# Patient Record
Sex: Female | Born: 1945 | Race: White | Hispanic: No | Marital: Married | Smoking: Never smoker
Health system: Southern US, Community
[De-identification: ages and names within clinical notes are randomized; demographics above are authoritative.]

## PROBLEM LIST (undated history)

## (undated) DIAGNOSIS — J45909 Unspecified asthma, uncomplicated: Secondary | ICD-10-CM

## (undated) DIAGNOSIS — Z9289 Personal history of other medical treatment: Secondary | ICD-10-CM

## (undated) DIAGNOSIS — R3915 Urgency of urination: Secondary | ICD-10-CM

## (undated) DIAGNOSIS — G8929 Other chronic pain: Secondary | ICD-10-CM

## (undated) DIAGNOSIS — R233 Spontaneous ecchymoses: Secondary | ICD-10-CM

## (undated) DIAGNOSIS — R112 Nausea with vomiting, unspecified: Secondary | ICD-10-CM

## (undated) DIAGNOSIS — M8580 Other specified disorders of bone density and structure, unspecified site: Secondary | ICD-10-CM

## (undated) DIAGNOSIS — M1990 Unspecified osteoarthritis, unspecified site: Secondary | ICD-10-CM

## (undated) DIAGNOSIS — R238 Other skin changes: Secondary | ICD-10-CM

## (undated) DIAGNOSIS — D649 Anemia, unspecified: Secondary | ICD-10-CM

## (undated) DIAGNOSIS — T8859XA Other complications of anesthesia, initial encounter: Secondary | ICD-10-CM

## (undated) DIAGNOSIS — I499 Cardiac arrhythmia, unspecified: Secondary | ICD-10-CM

## (undated) DIAGNOSIS — Z8709 Personal history of other diseases of the respiratory system: Secondary | ICD-10-CM

## (undated) DIAGNOSIS — J189 Pneumonia, unspecified organism: Secondary | ICD-10-CM

## (undated) DIAGNOSIS — J40 Bronchitis, not specified as acute or chronic: Secondary | ICD-10-CM

## (undated) DIAGNOSIS — M2550 Pain in unspecified joint: Secondary | ICD-10-CM

## (undated) DIAGNOSIS — M545 Low back pain, unspecified: Secondary | ICD-10-CM

## (undated) DIAGNOSIS — T4145XA Adverse effect of unspecified anesthetic, initial encounter: Secondary | ICD-10-CM

## (undated) DIAGNOSIS — T7840XA Allergy, unspecified, initial encounter: Secondary | ICD-10-CM

## (undated) DIAGNOSIS — K219 Gastro-esophageal reflux disease without esophagitis: Secondary | ICD-10-CM

## (undated) DIAGNOSIS — M2540 Effusion, unspecified joint: Secondary | ICD-10-CM

## (undated) DIAGNOSIS — Z91018 Allergy to other foods: Secondary | ICD-10-CM

## (undated) DIAGNOSIS — Z9889 Other specified postprocedural states: Secondary | ICD-10-CM

## (undated) DIAGNOSIS — E039 Hypothyroidism, unspecified: Secondary | ICD-10-CM

## (undated) DIAGNOSIS — F40240 Claustrophobia: Secondary | ICD-10-CM

## (undated) DIAGNOSIS — G709 Myoneural disorder, unspecified: Secondary | ICD-10-CM

## (undated) DIAGNOSIS — I10 Essential (primary) hypertension: Secondary | ICD-10-CM

## (undated) DIAGNOSIS — J302 Other seasonal allergic rhinitis: Secondary | ICD-10-CM

## (undated) HISTORY — PX: CHOLECYSTECTOMY: SHX55

## (undated) HISTORY — PX: ABDOMINAL HYSTERECTOMY: SHX81

## (undated) HISTORY — PX: POSTERIOR LUMBAR FUSION: SHX6036

## (undated) HISTORY — PX: KNEE ARTHROSCOPY: SHX127

## (undated) HISTORY — PX: BACK SURGERY: SHX140

## (undated) HISTORY — PX: OTHER SURGICAL HISTORY: SHX169

## (undated) HISTORY — DX: Allergy, unspecified, initial encounter: T78.40XA

## (undated) HISTORY — PX: TONSILLECTOMY AND ADENOIDECTOMY: SUR1326

## (undated) HISTORY — DX: Gastro-esophageal reflux disease without esophagitis: K21.9

## (undated) HISTORY — PX: INCISION AND DRAINAGE INTRA ORAL ABSCESS: SHX1802

## (undated) HISTORY — DX: Cardiac arrhythmia, unspecified: I49.9

## (undated) HISTORY — PX: APPENDECTOMY: SHX54

## (undated) HISTORY — DX: Anemia, unspecified: D64.9

## (undated) HISTORY — PX: ESOPHAGOGASTRODUODENOSCOPY: SHX1529

## (undated) HISTORY — PX: BILATERAL OOPHORECTOMY: SHX1221

## (undated) HISTORY — PX: JOINT REPLACEMENT: SHX530

## (undated) HISTORY — PX: LATERAL FUSION LUMBAR SPINE: SUR631

---

## 1978-06-23 HISTORY — PX: SHOULDER SURGERY: SHX246

## 1988-06-23 HISTORY — PX: SHOULDER ADHESION RELEASE: SHX773

## 1998-06-27 ENCOUNTER — Emergency Department (HOSPITAL_COMMUNITY): Admission: EM | Admit: 1998-06-27 | Discharge: 1998-06-27 | Payer: Self-pay | Admitting: *Deleted

## 1998-07-18 ENCOUNTER — Inpatient Hospital Stay (HOSPITAL_COMMUNITY): Admission: AD | Admit: 1998-07-18 | Discharge: 1998-07-23 | Payer: Self-pay | Admitting: Internal Medicine

## 1998-07-22 ENCOUNTER — Encounter: Payer: Self-pay | Admitting: Pulmonary Disease

## 1998-11-23 ENCOUNTER — Ambulatory Visit (HOSPITAL_BASED_OUTPATIENT_CLINIC_OR_DEPARTMENT_OTHER): Admission: RE | Admit: 1998-11-23 | Discharge: 1998-11-23 | Payer: Self-pay

## 1999-05-20 ENCOUNTER — Encounter: Payer: Self-pay | Admitting: Family Medicine

## 1999-05-20 ENCOUNTER — Ambulatory Visit (HOSPITAL_COMMUNITY): Admission: RE | Admit: 1999-05-20 | Discharge: 1999-05-20 | Payer: Self-pay | Admitting: Family Medicine

## 2000-07-22 ENCOUNTER — Ambulatory Visit (HOSPITAL_COMMUNITY): Admission: RE | Admit: 2000-07-22 | Discharge: 2000-07-22 | Payer: Self-pay | Admitting: *Deleted

## 2000-08-12 ENCOUNTER — Inpatient Hospital Stay (HOSPITAL_COMMUNITY): Admission: RE | Admit: 2000-08-12 | Discharge: 2000-08-19 | Payer: Self-pay | Admitting: *Deleted

## 2000-12-07 ENCOUNTER — Encounter: Admission: RE | Admit: 2000-12-07 | Discharge: 2000-12-07 | Payer: Self-pay | Admitting: *Deleted

## 2001-01-08 ENCOUNTER — Emergency Department (HOSPITAL_COMMUNITY): Admission: EM | Admit: 2001-01-08 | Discharge: 2001-01-08 | Payer: Self-pay | Admitting: Emergency Medicine

## 2001-02-10 ENCOUNTER — Inpatient Hospital Stay (HOSPITAL_COMMUNITY): Admission: RE | Admit: 2001-02-10 | Discharge: 2001-02-17 | Payer: Self-pay | Admitting: Oral Surgery

## 2001-02-10 ENCOUNTER — Encounter: Payer: Self-pay | Admitting: Oral Surgery

## 2001-02-15 ENCOUNTER — Encounter: Payer: Self-pay | Admitting: Oral Surgery

## 2001-05-19 ENCOUNTER — Encounter: Admission: RE | Admit: 2001-05-19 | Discharge: 2001-05-19 | Payer: Self-pay | Admitting: *Deleted

## 2001-12-13 ENCOUNTER — Encounter: Payer: Self-pay | Admitting: Emergency Medicine

## 2001-12-13 ENCOUNTER — Emergency Department (HOSPITAL_COMMUNITY): Admission: EM | Admit: 2001-12-13 | Discharge: 2001-12-14 | Payer: Self-pay | Admitting: Emergency Medicine

## 2002-02-07 ENCOUNTER — Ambulatory Visit (HOSPITAL_COMMUNITY): Admission: RE | Admit: 2002-02-07 | Discharge: 2002-02-07 | Payer: Self-pay | Admitting: *Deleted

## 2002-05-27 ENCOUNTER — Encounter: Payer: Self-pay | Admitting: Family Medicine

## 2002-05-27 ENCOUNTER — Encounter: Admission: RE | Admit: 2002-05-27 | Discharge: 2002-05-27 | Payer: Self-pay | Admitting: Family Medicine

## 2002-07-11 ENCOUNTER — Encounter: Payer: Self-pay | Admitting: Family Medicine

## 2002-07-11 ENCOUNTER — Encounter: Admission: RE | Admit: 2002-07-11 | Discharge: 2002-07-11 | Payer: Self-pay | Admitting: Family Medicine

## 2002-10-26 ENCOUNTER — Encounter: Payer: Self-pay | Admitting: Gastroenterology

## 2002-10-26 ENCOUNTER — Ambulatory Visit (HOSPITAL_COMMUNITY): Admission: RE | Admit: 2002-10-26 | Discharge: 2002-10-26 | Payer: Self-pay | Admitting: Gastroenterology

## 2003-01-19 ENCOUNTER — Encounter: Payer: Self-pay | Admitting: Gastroenterology

## 2003-01-19 ENCOUNTER — Ambulatory Visit (HOSPITAL_COMMUNITY): Admission: RE | Admit: 2003-01-19 | Discharge: 2003-01-19 | Payer: Self-pay | Admitting: Gastroenterology

## 2003-07-19 ENCOUNTER — Ambulatory Visit (HOSPITAL_BASED_OUTPATIENT_CLINIC_OR_DEPARTMENT_OTHER): Admission: RE | Admit: 2003-07-19 | Discharge: 2003-07-19 | Payer: Self-pay | Admitting: Orthopedic Surgery

## 2003-07-19 ENCOUNTER — Ambulatory Visit (HOSPITAL_COMMUNITY): Admission: RE | Admit: 2003-07-19 | Discharge: 2003-07-19 | Payer: Self-pay | Admitting: Orthopedic Surgery

## 2004-09-02 ENCOUNTER — Ambulatory Visit: Payer: Self-pay | Admitting: Gastroenterology

## 2004-09-11 ENCOUNTER — Ambulatory Visit: Payer: Self-pay | Admitting: Family Medicine

## 2004-09-18 ENCOUNTER — Ambulatory Visit: Payer: Self-pay | Admitting: Family Medicine

## 2004-09-18 ENCOUNTER — Other Ambulatory Visit: Admission: RE | Admit: 2004-09-18 | Discharge: 2004-09-18 | Payer: Self-pay | Admitting: Family Medicine

## 2004-12-20 ENCOUNTER — Ambulatory Visit: Payer: Self-pay | Admitting: Family Medicine

## 2004-12-30 ENCOUNTER — Ambulatory Visit: Payer: Self-pay | Admitting: Gastroenterology

## 2004-12-31 ENCOUNTER — Ambulatory Visit (HOSPITAL_COMMUNITY): Admission: RE | Admit: 2004-12-31 | Discharge: 2004-12-31 | Payer: Self-pay | Admitting: Family Medicine

## 2005-01-10 ENCOUNTER — Encounter: Admission: RE | Admit: 2005-01-10 | Discharge: 2005-01-10 | Payer: Self-pay | Admitting: Family Medicine

## 2005-01-14 ENCOUNTER — Ambulatory Visit: Payer: Self-pay | Admitting: Gastroenterology

## 2005-10-23 ENCOUNTER — Ambulatory Visit: Payer: Self-pay | Admitting: Family Medicine

## 2005-10-28 ENCOUNTER — Encounter: Admission: RE | Admit: 2005-10-28 | Discharge: 2005-10-28 | Payer: Self-pay | Admitting: Family Medicine

## 2005-11-19 ENCOUNTER — Ambulatory Visit: Payer: Self-pay | Admitting: Family Medicine

## 2005-12-04 ENCOUNTER — Ambulatory Visit (HOSPITAL_COMMUNITY): Admission: RE | Admit: 2005-12-04 | Discharge: 2005-12-04 | Payer: Self-pay | Admitting: Family Medicine

## 2006-02-17 ENCOUNTER — Encounter: Admission: RE | Admit: 2006-02-17 | Discharge: 2006-02-17 | Payer: Self-pay | Admitting: Neurosurgery

## 2006-03-13 ENCOUNTER — Encounter: Admission: RE | Admit: 2006-03-13 | Discharge: 2006-03-13 | Payer: Self-pay | Admitting: Neurosurgery

## 2006-05-07 ENCOUNTER — Encounter: Admission: RE | Admit: 2006-05-07 | Discharge: 2006-05-07 | Payer: Self-pay | Admitting: Neurosurgery

## 2006-05-08 ENCOUNTER — Ambulatory Visit: Payer: Self-pay | Admitting: Family Medicine

## 2006-05-08 LAB — CONVERTED CEMR LAB
TSH: 0.27 microintl units/mL — ABNORMAL LOW (ref 0.35–5.50)
Total CK: 48 units/L (ref 7–177)

## 2006-05-12 ENCOUNTER — Ambulatory Visit: Payer: Self-pay | Admitting: Family Medicine

## 2006-06-03 ENCOUNTER — Encounter: Admission: RE | Admit: 2006-06-03 | Discharge: 2006-06-03 | Payer: Self-pay | Admitting: Family Medicine

## 2006-06-12 ENCOUNTER — Encounter: Admission: RE | Admit: 2006-06-12 | Discharge: 2006-06-12 | Payer: Self-pay | Admitting: Family Medicine

## 2006-06-12 HISTORY — PX: BREAST BIOPSY: SHX20

## 2006-06-24 ENCOUNTER — Ambulatory Visit: Payer: Self-pay | Admitting: Family Medicine

## 2006-06-24 LAB — CONVERTED CEMR LAB
ALT: 17 units/L (ref 0–40)
AST: 17 units/L (ref 0–37)
Albumin: 3.5 g/dL (ref 3.5–5.2)
Alkaline Phosphatase: 63 units/L (ref 39–117)
BUN: 16 mg/dL (ref 6–23)
Basophils Absolute: 0 10*3/uL (ref 0.0–0.1)
Basophils Relative: 0.3 % (ref 0.0–1.0)
CO2: 28 meq/L (ref 19–32)
Calcium: 8.5 mg/dL (ref 8.4–10.5)
Chloride: 108 meq/L (ref 96–112)
Chol/HDL Ratio, serum: 3.8
Cholesterol: 191 mg/dL (ref 0–200)
Creatinine, Ser: 0.7 mg/dL (ref 0.4–1.2)
Eosinophil percent: 2.5 % (ref 0.0–5.0)
GFR calc non Af Amer: 91 mL/min
Glomerular Filtration Rate, Af Am: 110 mL/min/{1.73_m2}
Glucose, Bld: 92 mg/dL (ref 70–99)
HCT: 38 % (ref 36.0–46.0)
HDL: 50.2 mg/dL (ref >39.0)
Hemoglobin: 12.2 g/dL (ref 12.0–15.0)
LDL Cholesterol: 119 mg/dL — ABNORMAL HIGH (ref 0–99)
Lymphocytes Relative: 22.3 % (ref 12.0–46.0)
MCHC: 32.1 g/dL (ref 30.0–36.0)
MCV: 89.3 fL (ref 78.0–100.0)
Monocytes Absolute: 0.7 10*3/uL (ref 0.2–0.7)
Monocytes Relative: 8 % (ref 3.0–11.0)
Neutro Abs: 5.5 10*3/uL (ref 1.4–7.7)
Neutrophils Relative %: 66.9 % (ref 43.0–77.0)
Platelets: 240 10*3/uL (ref 150–400)
Potassium: 3.5 meq/L (ref 3.5–5.1)
RBC: 4.25 M/uL (ref 3.87–5.11)
RDW: 12.5 % (ref 11.5–14.6)
Sodium: 141 meq/L (ref 135–145)
TSH: 1.31 microintl units/mL (ref 0.35–5.50)
Total Bilirubin: 0.5 mg/dL (ref 0.3–1.2)
Total Protein: 5.8 g/dL — ABNORMAL LOW (ref 6.0–8.3)
Triglyceride fasting, serum: 110 mg/dL (ref 0–149)
VLDL: 22 mg/dL (ref 0–40)
WBC: 8.3 10*3/uL (ref 4.5–10.5)

## 2006-06-30 ENCOUNTER — Encounter: Payer: Self-pay | Admitting: Family Medicine

## 2006-06-30 ENCOUNTER — Ambulatory Visit: Payer: Self-pay | Admitting: Family Medicine

## 2006-07-01 ENCOUNTER — Other Ambulatory Visit: Admission: RE | Admit: 2006-07-01 | Discharge: 2006-07-01 | Payer: Self-pay | Admitting: Family Medicine

## 2006-10-13 ENCOUNTER — Ambulatory Visit: Payer: Self-pay | Admitting: Family Medicine

## 2006-10-13 LAB — CONVERTED CEMR LAB
Cholesterol: 213 mg/dL (ref 0–200)
Direct LDL: 135.9 mg/dL
HDL: 50.4 mg/dL (ref >39.0)
TSH: 1.22 microintl units/mL (ref 0.35–5.50)
Total CHOL/HDL Ratio: 4.2
Triglycerides: 181 mg/dL — ABNORMAL HIGH (ref 0–149)
VLDL: 36 mg/dL (ref 0–40)

## 2006-10-19 ENCOUNTER — Ambulatory Visit (HOSPITAL_COMMUNITY): Admission: RE | Admit: 2006-10-19 | Discharge: 2006-10-19 | Payer: Self-pay | Admitting: Neurosurgery

## 2006-11-06 ENCOUNTER — Ambulatory Visit: Payer: Self-pay | Admitting: Internal Medicine

## 2006-12-08 ENCOUNTER — Inpatient Hospital Stay (HOSPITAL_COMMUNITY): Admission: RE | Admit: 2006-12-08 | Discharge: 2006-12-16 | Payer: Self-pay | Admitting: Neurosurgery

## 2006-12-23 ENCOUNTER — Telehealth: Payer: Self-pay | Admitting: Family Medicine

## 2006-12-30 ENCOUNTER — Encounter: Payer: Self-pay | Admitting: Family Medicine

## 2007-02-10 DIAGNOSIS — K219 Gastro-esophageal reflux disease without esophagitis: Secondary | ICD-10-CM | POA: Insufficient documentation

## 2007-03-03 ENCOUNTER — Ambulatory Visit: Payer: Self-pay | Admitting: Family Medicine

## 2007-03-03 DIAGNOSIS — E039 Hypothyroidism, unspecified: Secondary | ICD-10-CM | POA: Insufficient documentation

## 2007-03-03 DIAGNOSIS — I10 Essential (primary) hypertension: Secondary | ICD-10-CM | POA: Insufficient documentation

## 2007-03-10 LAB — CONVERTED CEMR LAB: TSH: 1.53 microintl units/mL (ref 0.35–5.50)

## 2007-03-11 ENCOUNTER — Encounter: Payer: Self-pay | Admitting: Family Medicine

## 2007-04-29 ENCOUNTER — Ambulatory Visit: Payer: Self-pay | Admitting: Family Medicine

## 2007-05-13 ENCOUNTER — Encounter: Payer: Self-pay | Admitting: Family Medicine

## 2007-05-13 LAB — CONVERTED CEMR LAB
Cholesterol: 195 mg/dL (ref 0–200)
HDL: 46 mg/dL (ref >39.0)
LDL Cholesterol: 133 mg/dL — ABNORMAL HIGH (ref 0–99)
Total CHOL/HDL Ratio: 4.2
Triglycerides: 82 mg/dL (ref 0–149)
VLDL: 16 mg/dL (ref 0–40)

## 2007-06-07 ENCOUNTER — Encounter: Admission: RE | Admit: 2007-06-07 | Discharge: 2007-06-07 | Payer: Self-pay | Admitting: Family Medicine

## 2007-06-21 ENCOUNTER — Encounter: Payer: Self-pay | Admitting: Family Medicine

## 2007-06-21 ENCOUNTER — Encounter: Admission: RE | Admit: 2007-06-21 | Discharge: 2007-06-21 | Payer: Self-pay | Admitting: Family Medicine

## 2007-12-10 ENCOUNTER — Encounter: Payer: Self-pay | Admitting: Family Medicine

## 2007-12-29 ENCOUNTER — Ambulatory Visit (HOSPITAL_COMMUNITY): Admission: RE | Admit: 2007-12-29 | Discharge: 2007-12-29 | Payer: Self-pay | Admitting: Neurosurgery

## 2008-01-28 ENCOUNTER — Encounter: Payer: Self-pay | Admitting: Family Medicine

## 2008-02-04 ENCOUNTER — Encounter: Admission: RE | Admit: 2008-02-04 | Discharge: 2008-02-04 | Payer: Self-pay | Admitting: Neurosurgery

## 2008-03-21 ENCOUNTER — Encounter: Payer: Self-pay | Admitting: Family Medicine

## 2008-03-28 ENCOUNTER — Encounter: Admission: RE | Admit: 2008-03-28 | Discharge: 2008-03-28 | Payer: Self-pay | Admitting: Neurosurgery

## 2008-04-25 ENCOUNTER — Encounter: Payer: Self-pay | Admitting: Family Medicine

## 2008-07-11 ENCOUNTER — Inpatient Hospital Stay (HOSPITAL_COMMUNITY): Admission: RE | Admit: 2008-07-11 | Discharge: 2008-07-19 | Payer: Self-pay | Admitting: Neurosurgery

## 2008-07-24 ENCOUNTER — Encounter: Payer: Self-pay | Admitting: Family Medicine

## 2008-08-02 ENCOUNTER — Ambulatory Visit: Payer: Self-pay | Admitting: Family Medicine

## 2008-08-02 LAB — CONVERTED CEMR LAB
Bilirubin Urine: NEGATIVE
Blood in Urine, dipstick: NEGATIVE
Glucose, Urine, Semiquant: NEGATIVE
Ketones, urine, test strip: NEGATIVE
Nitrite: NEGATIVE
Specific Gravity, Urine: 1.02
Urobilinogen, UA: 0.2
WBC Urine, dipstick: NEGATIVE
pH: 5.5

## 2008-08-09 ENCOUNTER — Ambulatory Visit: Payer: Self-pay | Admitting: Family Medicine

## 2008-08-09 DIAGNOSIS — Z9889 Other specified postprocedural states: Secondary | ICD-10-CM | POA: Insufficient documentation

## 2008-08-09 LAB — CONVERTED CEMR LAB
ALT: 12 units/L (ref 0–35)
AST: 16 units/L (ref 0–37)
Albumin: 3.7 g/dL (ref 3.5–5.2)
Alkaline Phosphatase: 97 units/L (ref 39–117)
BUN: 15 mg/dL (ref 6–23)
Basophils Absolute: 0 10*3/uL (ref 0.0–0.1)
Basophils Relative: 0.4 % (ref 0.0–3.0)
Bilirubin, Direct: 0.1 mg/dL (ref 0.0–0.3)
CO2: 29 meq/L (ref 19–32)
Calcium: 8.7 mg/dL (ref 8.4–10.5)
Chloride: 108 meq/L (ref 96–112)
Cholesterol: 219 mg/dL (ref 0–200)
Creatinine, Ser: 0.7 mg/dL (ref 0.4–1.2)
Direct LDL: 141.6 mg/dL
Eosinophils Absolute: 0.1 10*3/uL (ref 0.0–0.7)
Eosinophils Relative: 2.1 % (ref 0.0–5.0)
GFR calc Af Amer: 109 mL/min
GFR calc non Af Amer: 90 mL/min
Glucose, Bld: 88 mg/dL (ref 70–99)
HCT: 33.8 % — ABNORMAL LOW (ref 36.0–46.0)
HDL: 40.1 mg/dL (ref >39.0)
Hemoglobin: 11.5 g/dL — ABNORMAL LOW (ref 12.0–15.0)
Lymphocytes Relative: 26.6 % (ref 12.0–46.0)
MCHC: 34.1 g/dL (ref 30.0–36.0)
MCV: 88 fL (ref 78.0–100.0)
Monocytes Absolute: 0.5 10*3/uL (ref 0.1–1.0)
Monocytes Relative: 9.6 % (ref 3.0–12.0)
Neutro Abs: 3.5 10*3/uL (ref 1.4–7.7)
Neutrophils Relative %: 61.3 % (ref 43.0–77.0)
Platelets: 196 10*3/uL (ref 150–400)
Potassium: 4.1 meq/L (ref 3.5–5.1)
RBC: 3.84 M/uL — ABNORMAL LOW (ref 3.87–5.11)
RDW: 12.7 % (ref 11.5–14.6)
Sodium: 140 meq/L (ref 135–145)
TSH: 1.2 microintl units/mL (ref 0.35–5.50)
Total Bilirubin: 0.7 mg/dL (ref 0.3–1.2)
Total CHOL/HDL Ratio: 5.5
Total Protein: 6.2 g/dL (ref 6.0–8.3)
Triglycerides: 208 mg/dL (ref 0–149)
VLDL: 42 mg/dL — ABNORMAL HIGH (ref 0–40)
WBC: 5.6 10*3/uL (ref 4.5–10.5)

## 2008-08-30 ENCOUNTER — Encounter: Payer: Self-pay | Admitting: Family Medicine

## 2008-09-27 ENCOUNTER — Encounter: Payer: Self-pay | Admitting: Family Medicine

## 2008-10-25 ENCOUNTER — Encounter: Payer: Self-pay | Admitting: Family Medicine

## 2008-11-29 ENCOUNTER — Encounter: Payer: Self-pay | Admitting: Family Medicine

## 2009-01-08 ENCOUNTER — Encounter: Admission: RE | Admit: 2009-01-08 | Discharge: 2009-01-08 | Payer: Self-pay | Admitting: Family Medicine

## 2009-01-08 LAB — HM MAMMOGRAPHY

## 2009-01-09 ENCOUNTER — Ambulatory Visit: Payer: Self-pay | Admitting: Family Medicine

## 2009-01-09 DIAGNOSIS — D649 Anemia, unspecified: Secondary | ICD-10-CM | POA: Insufficient documentation

## 2009-01-09 DIAGNOSIS — R5383 Other fatigue: Secondary | ICD-10-CM | POA: Insufficient documentation

## 2009-01-09 DIAGNOSIS — L659 Nonscarring hair loss, unspecified: Secondary | ICD-10-CM | POA: Insufficient documentation

## 2009-01-09 DIAGNOSIS — M5440 Lumbago with sciatica, unspecified side: Secondary | ICD-10-CM | POA: Insufficient documentation

## 2009-01-12 LAB — CONVERTED CEMR LAB
Basophils Absolute: 0 10*3/uL (ref 0.0–0.1)
Basophils Relative: 0.4 % (ref 0.0–3.0)
Eosinophils Absolute: 0.2 10*3/uL (ref 0.0–0.7)
Eosinophils Relative: 2.2 % (ref 0.0–5.0)
HCT: 38.4 % (ref 36.0–46.0)
Hemoglobin: 13.3 g/dL (ref 12.0–15.0)
Lymphocytes Relative: 22.6 % (ref 12.0–46.0)
Lymphs Abs: 2 10*3/uL (ref 0.7–4.0)
MCHC: 34.7 g/dL (ref 30.0–36.0)
MCV: 88.1 fL (ref 78.0–100.0)
Monocytes Absolute: 0.8 10*3/uL (ref 0.1–1.0)
Monocytes Relative: 8.5 % (ref 3.0–12.0)
Neutro Abs: 6 10*3/uL (ref 1.4–7.7)
Neutrophils Relative %: 66.3 % (ref 43.0–77.0)
Platelets: 193 10*3/uL (ref 150.0–400.0)
RBC: 4.37 M/uL (ref 3.87–5.11)
RDW: 13.2 % (ref 11.5–14.6)
TSH: 0.98 microintl units/mL (ref 0.35–5.50)
WBC: 9 10*3/uL (ref 4.5–10.5)

## 2009-02-07 ENCOUNTER — Encounter: Payer: Self-pay | Admitting: Family Medicine

## 2009-05-16 ENCOUNTER — Encounter: Payer: Self-pay | Admitting: Family Medicine

## 2009-06-11 ENCOUNTER — Encounter: Admission: RE | Admit: 2009-06-11 | Discharge: 2009-06-11 | Payer: Self-pay | Admitting: Neurosurgery

## 2009-07-12 ENCOUNTER — Encounter: Payer: Self-pay | Admitting: Family Medicine

## 2009-09-13 ENCOUNTER — Ambulatory Visit: Payer: Self-pay | Admitting: Family Medicine

## 2009-09-13 LAB — CONVERTED CEMR LAB
Bilirubin Urine: NEGATIVE
Blood in Urine, dipstick: NEGATIVE
Glucose, Urine, Semiquant: NEGATIVE
Ketones, urine, test strip: NEGATIVE
Nitrite: NEGATIVE
Protein, U semiquant: NEGATIVE
Specific Gravity, Urine: 1.015
Urobilinogen, UA: 0.2
WBC Urine, dipstick: NEGATIVE
pH: 7

## 2009-09-19 ENCOUNTER — Encounter: Payer: Self-pay | Admitting: Family Medicine

## 2009-09-26 ENCOUNTER — Ambulatory Visit: Payer: Self-pay | Admitting: Family Medicine

## 2009-09-26 ENCOUNTER — Other Ambulatory Visit: Admission: RE | Admit: 2009-09-26 | Discharge: 2009-09-26 | Payer: Self-pay | Admitting: Family Medicine

## 2009-09-26 DIAGNOSIS — N959 Unspecified menopausal and perimenopausal disorder: Secondary | ICD-10-CM | POA: Insufficient documentation

## 2009-09-26 DIAGNOSIS — R3915 Urgency of urination: Secondary | ICD-10-CM | POA: Insufficient documentation

## 2009-09-26 LAB — CONVERTED CEMR LAB
ALT: 15 units/L (ref 0–35)
AST: 18 units/L (ref 0–37)
Albumin: 3.9 g/dL (ref 3.5–5.2)
Alkaline Phosphatase: 67 units/L (ref 39–117)
BUN: 18 mg/dL (ref 6–23)
Basophils Absolute: 0 10*3/uL (ref 0.0–0.1)
Basophils Relative: 0.4 % (ref 0.0–3.0)
Bilirubin, Direct: 0 mg/dL (ref 0.0–0.3)
CO2: 28 meq/L (ref 19–32)
Calcium: 8.7 mg/dL (ref 8.4–10.5)
Chloride: 103 meq/L (ref 96–112)
Cholesterol: 199 mg/dL (ref 0–200)
Creatinine, Ser: 0.7 mg/dL (ref 0.4–1.2)
Eosinophils Absolute: 0.1 10*3/uL (ref 0.0–0.7)
Eosinophils Relative: 1.9 % (ref 0.0–5.0)
GFR calc non Af Amer: 89.64 mL/min (ref >60)
Glucose, Bld: 84 mg/dL (ref 70–99)
HCT: 39.6 % (ref 36.0–46.0)
HDL: 57.4 mg/dL (ref >39.00)
Hemoglobin: 13.1 g/dL (ref 12.0–15.0)
LDL Cholesterol: 107 mg/dL — ABNORMAL HIGH (ref 0–99)
Lymphocytes Relative: 28.4 % (ref 12.0–46.0)
Lymphs Abs: 1.8 10*3/uL (ref 0.7–4.0)
MCHC: 33.1 g/dL (ref 30.0–36.0)
MCV: 91.2 fL (ref 78.0–100.0)
Monocytes Absolute: 0.5 10*3/uL (ref 0.1–1.0)
Monocytes Relative: 8.6 % (ref 3.0–12.0)
Neutro Abs: 3.8 10*3/uL (ref 1.4–7.7)
Neutrophils Relative %: 60.7 % (ref 43.0–77.0)
Platelets: 195 10*3/uL (ref 150.0–400.0)
Potassium: 4 meq/L (ref 3.5–5.1)
RBC: 4.34 M/uL (ref 3.87–5.11)
RDW: 12.5 % (ref 11.5–14.6)
Sodium: 140 meq/L (ref 135–145)
TSH: 1.88 microintl units/mL (ref 0.35–5.50)
Total Bilirubin: 0.6 mg/dL (ref 0.3–1.2)
Total CHOL/HDL Ratio: 3
Total Protein: 6.4 g/dL (ref 6.0–8.3)
Triglycerides: 171 mg/dL — ABNORMAL HIGH (ref 0.0–149.0)
VLDL: 34.2 mg/dL (ref 0.0–40.0)
WBC: 6.2 10*3/uL (ref 4.5–10.5)

## 2009-09-26 LAB — HM PAP SMEAR

## 2009-10-10 ENCOUNTER — Ambulatory Visit (HOSPITAL_COMMUNITY): Admission: RE | Admit: 2009-10-10 | Discharge: 2009-10-10 | Payer: Self-pay | Admitting: Neurosurgery

## 2009-10-11 ENCOUNTER — Encounter: Payer: Self-pay | Admitting: Family Medicine

## 2009-11-12 ENCOUNTER — Encounter: Payer: Self-pay | Admitting: Family Medicine

## 2009-11-23 ENCOUNTER — Encounter: Admission: RE | Admit: 2009-11-23 | Discharge: 2009-11-23 | Payer: Self-pay | Admitting: Neurosurgery

## 2009-12-26 ENCOUNTER — Encounter: Payer: Self-pay | Admitting: Family Medicine

## 2010-01-22 ENCOUNTER — Inpatient Hospital Stay (HOSPITAL_COMMUNITY): Admission: RE | Admit: 2010-01-22 | Discharge: 2010-01-28 | Payer: Self-pay | Admitting: Neurosurgery

## 2010-02-04 ENCOUNTER — Encounter: Payer: Self-pay | Admitting: Family Medicine

## 2010-03-13 ENCOUNTER — Encounter: Payer: Self-pay | Admitting: Family Medicine

## 2010-04-17 ENCOUNTER — Encounter: Payer: Self-pay | Admitting: Family Medicine

## 2010-07-14 ENCOUNTER — Encounter: Payer: Self-pay | Admitting: Family Medicine

## 2010-07-14 ENCOUNTER — Encounter: Payer: Self-pay | Admitting: Neurosurgery

## 2010-07-21 LAB — CONVERTED CEMR LAB: Pap Smear: NEGATIVE

## 2010-07-25 NOTE — Assessment & Plan Note (Signed)
Summary: cpx/pap/cjr   Vital Signs:  Patient profile:   65 year old female Height:      63 inches Weight:      156.5 pounds O2 Sat:      98 % Temp:     98.3 degrees F Pulse rate:   78 / minute BP sitting:   140 / 80  (left arm)  Vitals Entered By: Pura Spice, RN (September 26, 2009 3:11 PM) CC: cpx with pap   Is Patient Diabetic? 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 Channing Mutters She is unable work at this time and is out on disability, continues to have pain and difficulty doing many of her activities. She is a poor bunion on the health but is unable to return to work. Her other complaints are that of pain of the right knee which he received an injection by Dr. Brynda Greathouse if she needs further treatment she is to see Dr. Matthew Saras she needs a replacement. Complains of shoulder pain on rotation or movement of the right shoulder no point tenderness He has episodes of tachycardia and PVCs which she has had in the past but she does limit her intake of caffeine  She has attempted to stop estradiol in the past the father to do FOLFOX and well without hot flashes if she does so.Patient also claims of nasal congestion with a pollen at this time. Indigestion relieved with Tom problem with urinary urgency but no incontinence  EKG  Procedure date:  09/26/2009  Findings:       sinus rhythm with rate of:  63 short PR interval anterior T wave changes are nonspecific  Allergies: 1)  ! * Mso4 2)  ! Darvocet-N 100 3)  Morphine Sulfate (Morphine Sulfate)  Past History:  Past Medical History: Last updated: 02/10/2007 Seasonal Allergies Arrythmia GERD Thyroid Problems UTI's  Social History: Last updated: 02/10/2007 Occupation: Therapist, sports Married Never Smoked Alcohol use-no Drug use-no Regular exercise-yes  Risk Factors: Smoking Status: never (02/10/2007)  Past Surgical History: Cholecystectomy Hysterectomy Abcess Lower Jaw Appendectomy Tonsillectomy laminectomy  fusion lumbar 07/11/08  Review of Systems  The patient denies anorexia, fever, weight loss, weight gain, vision loss, decreased hearing, hoarseness, chest pain, syncope, dyspnea on exertion, peripheral edema, prolonged cough, headaches, hemoptysis, abdominal pain, melena, hematochezia, severe indigestion/heartburn, hematuria, incontinence, genital sores, muscle weakness, suspicious skin lesions, transient blindness, difficulty walking, depression, unusual weight change, abnormal bleeding, enlarged lymph nodes, angioedema, breast masses, and testicular masses.    Physical Exam  General:  Well-developed,well-nourished,in no acute distress; alert,appropriate and cooperative throughout examination Head:  Normocephalic and atraumatic without obvious abnormalities. No apparent alopecia or balding. Eyes:  No corneal or conjunctival inflammation noted. EOMI. Perrla. Funduscopic exam benign, without hemorrhages, exudates or papilledema. Vision grossly normal. Ears:  External ear exam shows no significant lesions or deformities.  Otoscopic examination reveals clear canals, tympanic membranes are intact bilaterally without bulging, retraction, inflammation or discharge. Hearing is grossly normal bilaterally. Nose:  boggy nasal mucosa with clear drainage Mouth:  Oral mucosa and oropharynx without lesions or exudates.  Teeth in good repair. Neck:  No deformities, masses, or tenderness noted. Chest Wall:  No deformities, masses, or tenderness noted. Breasts:  No mass, nodules, thickening, tenderness, bulging, retraction, inflamation, nipple  discharge or skin changes noted.   Lungs:  Normal respiratory effort, chest expands symmetrically. Lungs are clear to  auscultation, no crackles or wheezes. Heart:  Normal rate and regular rhythm. S1 and S2 normal without gallop, murmur, click, rub or other extra sounds. Abdomen:  Bowel sounds positive,abdomen soft and non-tender without masses, organomegaly or hernias noted. Rectal:  No external abnormalities noted. Normal sphincter tone. No rectal masses or tenderness. Genitalia:  absent uterus otherwise normal exam good muscle contraction mucosa normal in appearance no evidence of atrophy Msk:  full right knee with a lot of tenderness on palpation some evidence of chondromalacia Laminectomy scar pain on flexion as well as tenderness over the lumbar area Shoulder discomfort on rotation of the shoulder but no point tenderness Pulses:  R and L carotid,radial,femoral,dorsalis pedis and posterior tibial pulses are full and equal bilaterally Extremities:  No clubbing, cyanosis, edema, or deformity noted with normal full range of motion of all joints.   Neurologic:  No cranial nerve deficits noted. Station and gait are normal. Plantar reflexes are down-going bilaterally. DTRs are symmetrical throughout. Sensory, motor and coordinative functions appear intact. Skin:  Intact without suspicious lesions or rashes Cervical Nodes:  No lymphadenopathy noted Axillary Nodes:  No palpable lymphadenopathy Inguinal Nodes:  No significant adenopathy Psych:  Cognition and judgment appear intact. Alert and cooperative with normal attention span and concentration. No apparent delusions, illusions, hallucinations   Impression & Recommendations:  Problem # 1:  HEALTH MAINTENANCE EXAM (ICD-V70.0) Assessment Unchanged  Problem # 2:  URINARY URGENCY (OZH-086.57) Assessment: New VESIcare 5 mg q.d. samples  Problem # 3:  POSTMENOPAUSAL SYNDROME (ICD-627.9) Assessment: Unchanged  Her updated medication list for this problem includes:    Estradiol 1 Mg Tabs (Estradiol) ..... Once daily  Problem # 4:  BACK PAIN, CHRONIC  (ICD-724.5) Assessment: Unchanged  Her updated medication list for this problem includes:    Adult Aspirin Low Strength 81 Mg Tbdp (Aspirin)    Ibuprofen 600 Mg Tabs (Ibuprofen)  Problem # 5:  LAMINECTOMY, LUMBAR, HX OF (ICD-V45.89) Assessment: Improved  Problem # 6:  HYPERTENSION, BENIGN (ICD-401.1) Assessment: Improved  Her updated medication list for this problem includes:    Lopressor 50 Mg Tabs (Metoprolol tartrate) .Marland Kitchen... Take 1 tablet twice a day  Complete Medication List: 1)  Adult Aspirin Low Strength 81 Mg Tbdp (Aspirin) 2)  Ibuprofen 600 Mg Tabs (Ibuprofen) 3)  Estradiol 1 Mg Tabs (Estradiol) .... Once daily 4)  Lopressor 50 Mg Tabs (Metoprolol tartrate) .... Take 1 tablet twice a day 5)  Vesicare 5 Mg Tabs (Solifenacin succinate) .Marland Kitchen.. 1 qd  Patient Instructions: 1)  Continue care Dr. Channing Mutters regarding continuing back problem 2)  to try vesicare for urinary urgency 3)  refilled medications. Labs good Prescriptions: LOPRESSOR 50 MG  TABS (METOPROLOL TARTRATE) take 1 tablet twice a day  #180 x 3   Entered and Authorized by:   Judithann Sheen MD   Signed by:   Judithann Sheen MD on 09/26/2009   Method used:   Electronically to        [facility] (retail)       [address]       [place], [place]  [address]       Ph: [phone]       Fax: [phone]   RxID:   [id number] ESTRADIOL 1 MG  TABS (ESTRADIOL) once daily  #90 x 3  Entered and Authorized by:   Judithann Sheen MD   Signed by:   Judithann Sheen MD on 09/26/2009   Method used:   Electronically to        [facility] (retail)       [address]       [place], [place]  [address]       Ph: [phone]       Fax: [phone]   RxID:   [id number]     Appended Document: cpx/pap/cjr electrocardiogram interpreted as she will sign up for him no evidence of  : Essentially normal EKG

## 2010-07-25 NOTE — Letter (Signed)
Summary: [facility]  [facility]   Imported By: Maryln Gottron 12/20/2009 13:54:35  _____________________________________________________________________  External Attachment:    Type:   Image     Comment:   External Document

## 2010-07-25 NOTE — Letter (Signed)
Summary: [facility]  [facility]   Imported By: Maryln Gottron 08/29/2009 14:37:37  _____________________________________________________________________  External Attachment:    Type:   Image     Comment:   External Document

## 2010-07-25 NOTE — Letter (Signed)
Summary: [facility]  [facility]   Imported By: Maryln Gottron 05/09/2010 10:49:12  _____________________________________________________________________  External Attachment:    Type:   Image     Comment:   External Document

## 2010-07-25 NOTE — Letter (Signed)
Summary: [facility]  [facility]   Imported By: Maryln Gottron 03/12/2010 12:25:33  _____________________________________________________________________  External Attachment:    Type:   Image     Comment:   External Document

## 2010-07-25 NOTE — Letter (Signed)
Summary: Results Follow-up Letter  [place] [facility]  [address] [place], [place] [address]   Phone: [phone]  Fax: [phone]    10/11/2009  [address] [place], [place]  [address]  Dear Melissa Jordan,   The following are the results of your recent test(s):  Test     Result     Pap Smear    Normal__yes _____  Sincerely,  Dr Gwenyth Bender Stafford,MD     [place] [facility]

## 2010-07-25 NOTE — Letter (Signed)
Summary: [facility]  [facility]   Imported By: Maryln Gottron 03/21/2010 11:29:39  _____________________________________________________________________  External Attachment:    Type:   Image     Comment:   External Document

## 2010-07-25 NOTE — Letter (Signed)
Summary: [facility]  [facility]   Imported By: Maryln Gottron 01/23/2010 12:36:28  _____________________________________________________________________  External Attachment:    Type:   Image     Comment:   External Document  Appended Document: [facility] reviewed

## 2010-07-25 NOTE — Letter (Signed)
Summary: [facility]  [facility]   Imported By: Maryln Gottron 10/04/2009 09:32:57  _____________________________________________________________________  External Attachment:    Type:   Image     Comment:   External Document

## 2010-07-26 NOTE — Letter (Signed)
Summary: [facility]  [facility]   Imported By: Maryln Gottron 08/24/2009 15:24:36  _____________________________________________________________________  External Attachment:    Type:   Image     Comment:   External Document

## 2010-09-07 LAB — COMPREHENSIVE METABOLIC PANEL
ALT: 13 U/L (ref 0–35)
AST: 17 U/L (ref 0–37)
Albumin: 4 g/dL (ref 3.5–5.2)
Alkaline Phosphatase: 72 U/L (ref 39–117)
BUN: 19 mg/dL (ref 6–23)
CO2: 27 mEq/L (ref 19–32)
Calcium: 9 mg/dL (ref 8.4–10.5)
Chloride: 105 mEq/L (ref 96–112)
Creatinine, Ser: 0.78 mg/dL (ref 0.4–1.2)
GFR calc Af Amer: >60 mL/min (ref >60)
GFR calc non Af Amer: >60 mL/min (ref >60)
Glucose, Bld: 84 mg/dL (ref 70–99)
Potassium: 3.1 mEq/L — ABNORMAL LOW (ref 3.5–5.1)
Sodium: 138 mEq/L (ref 135–145)
Total Bilirubin: 0.5 mg/dL (ref 0.3–1.2)
Total Protein: 6.2 g/dL (ref 6.0–8.3)

## 2010-09-07 LAB — CBC
HCT: 37.9 % (ref 36.0–46.0)
Hemoglobin: 12.8 g/dL (ref 12.0–15.0)
MCH: 30.9 pg (ref 26.0–34.0)
MCHC: 33.8 g/dL (ref 30.0–36.0)
MCV: 91.3 fL (ref 78.0–100.0)
Platelets: 191 10*3/uL (ref 150–400)
RBC: 4.14 MIL/uL (ref 3.87–5.11)
RDW: 13.8 % (ref 11.5–15.5)
WBC: 11.8 10*3/uL — ABNORMAL HIGH (ref 4.0–10.5)

## 2010-09-07 LAB — URINALYSIS, ROUTINE W REFLEX MICROSCOPIC
Bilirubin Urine: NEGATIVE
Glucose, UA: NEGATIVE mg/dL
Hgb urine dipstick: NEGATIVE
Ketones, ur: NEGATIVE mg/dL
Nitrite: NEGATIVE
Protein, ur: NEGATIVE mg/dL
Specific Gravity, Urine: 1.022 (ref 1.005–1.030)
Urobilinogen, UA: 0.2 mg/dL (ref 0.0–1.0)
pH: 5.5 (ref 5.0–8.0)

## 2010-09-07 LAB — DIFFERENTIAL
Basophils Absolute: 0.1 10*3/uL (ref 0.0–0.1)
Basophils Relative: 1 % (ref 0–1)
Eosinophils Absolute: 0.2 10*3/uL (ref 0.0–0.7)
Eosinophils Relative: 1 % (ref 0–5)
Lymphocytes Relative: 21 % (ref 12–46)
Lymphs Abs: 2.5 10*3/uL (ref 0.7–4.0)
Monocytes Absolute: 0.8 10*3/uL (ref 0.1–1.0)
Monocytes Relative: 7 % (ref 3–12)
Neutro Abs: 8.2 10*3/uL — ABNORMAL HIGH (ref 1.7–7.7)
Neutrophils Relative %: 70 % (ref 43–77)

## 2010-09-07 LAB — APTT: aPTT: 27 seconds (ref 24–37)

## 2010-09-07 LAB — TYPE AND SCREEN
ABO/RH(D): O POS
Antibody Screen: NEGATIVE

## 2010-09-07 LAB — PROTIME-INR
INR: 0.96 (ref 0.00–1.49)
Prothrombin Time: 12.7 seconds (ref 11.6–15.2)

## 2010-09-07 LAB — SURGICAL PCR SCREEN
MRSA, PCR: NEGATIVE
Staphylococcus aureus: NEGATIVE

## 2010-10-07 LAB — DIFFERENTIAL
Basophils Absolute: 0 10*3/uL (ref 0.0–0.1)
Basophils Relative: 0 % (ref 0–1)
Eosinophils Absolute: 0.1 10*3/uL (ref 0.0–0.7)
Eosinophils Relative: 2 % (ref 0–5)
Lymphocytes Relative: 22 % (ref 12–46)
Lymphs Abs: 1.8 10*3/uL (ref 0.7–4.0)
Monocytes Absolute: 0.6 10*3/uL (ref 0.1–1.0)
Monocytes Relative: 7 % (ref 3–12)
Neutro Abs: 5.7 10*3/uL (ref 1.7–7.7)
Neutrophils Relative %: 70 % (ref 43–77)

## 2010-10-07 LAB — CBC
HCT: 40 % (ref 36.0–46.0)
Hemoglobin: 13.6 g/dL (ref 12.0–15.0)
MCHC: 34 g/dL (ref 30.0–36.0)
MCV: 89.2 fL (ref 78.0–100.0)
Platelets: 222 10*3/uL (ref 150–400)
RBC: 4.48 MIL/uL (ref 3.87–5.11)
RDW: 12.6 % (ref 11.5–15.5)
WBC: 8.2 10*3/uL (ref 4.0–10.5)

## 2010-10-07 LAB — URINALYSIS, ROUTINE W REFLEX MICROSCOPIC
Bilirubin Urine: NEGATIVE
Glucose, UA: NEGATIVE mg/dL
Hgb urine dipstick: NEGATIVE
Ketones, ur: NEGATIVE mg/dL
Nitrite: NEGATIVE
Protein, ur: NEGATIVE mg/dL
Specific Gravity, Urine: 1.016 (ref 1.005–1.030)
Urobilinogen, UA: 0.2 mg/dL (ref 0.0–1.0)
pH: 8 (ref 5.0–8.0)

## 2010-10-07 LAB — COMPREHENSIVE METABOLIC PANEL
ALT: 16 U/L (ref 0–35)
AST: 17 U/L (ref 0–37)
Albumin: 3.6 g/dL (ref 3.5–5.2)
Alkaline Phosphatase: 73 U/L (ref 39–117)
BUN: 16 mg/dL (ref 6–23)
CO2: 27 mEq/L (ref 19–32)
Calcium: 9.5 mg/dL (ref 8.4–10.5)
Chloride: 106 mEq/L (ref 96–112)
Creatinine, Ser: 0.61 mg/dL (ref 0.4–1.2)
GFR calc Af Amer: >60 mL/min (ref >60)
GFR calc non Af Amer: >60 mL/min (ref >60)
Glucose, Bld: 84 mg/dL (ref 70–99)
Potassium: 4.4 mEq/L (ref 3.5–5.1)
Sodium: 141 mEq/L (ref 135–145)
Total Bilirubin: 0.8 mg/dL (ref 0.3–1.2)
Total Protein: 5.9 g/dL — ABNORMAL LOW (ref 6.0–8.3)

## 2010-10-07 LAB — TYPE AND SCREEN
ABO/RH(D): O POS
Antibody Screen: NEGATIVE

## 2010-10-07 LAB — PROTIME-INR
INR: 1 (ref 0.00–1.49)
Prothrombin Time: 13.1 seconds (ref 11.6–15.2)

## 2010-10-07 LAB — APTT: aPTT: 32 seconds (ref 24–37)

## 2010-11-05 NOTE — H&P (Signed)
Melissa Jordan, Melissa Jordan              ACCOUNT NO.:  [id number]   MEDICAL RECORD NO.:  [id number]          PATIENT TYPE:  OIB   LOCATION:  3010                         FACILITY:  [facility]   PHYSICIAN:  Payton Doughty, M.D.      DATE OF BIRTH:  14-Sep-1945   DATE OF ADMISSION:  12/08/2006  DATE OF DISCHARGE:                              HISTORY & PHYSICAL   ADMISSION DIAGNOSIS:  Spondylosis L4-5.   HISTORY OF PRESENT ILLNESS:  This is a very nice 65 year old left-handed  white lady who since March or April has been having increasing pain out  to her left hip with tingling sensation down her left leg, slight  tingling on the right.  Bladder has not been affected.  She was found to  have spinal stenosis at L4-5 with a small synovial cyst on the left and  underwent epidural steroids, did not help very much.  She came back in  April with more pain in her left leg.  Studied her with an MR, shows  narrowing of the left neural foramina, worse on the left than the right,  and the synovial cyst as well.  She is now admitted for left L4-5  laminotomy, foraminotomy and removal of synovial cyst.   MEDICAL HISTORY:  Remarkable for idiopathic tachycardia.   MEDICATIONS:  1. Toprol 50 mg daily.  2. Synthroid 0.075 mg daily.  3. Premarin 0.65 mg daily.  4. Vicodin on a p.r.n. basis.   ALLERGIES:  MORPHINE AND DARVOCET.   SURGICAL HISTORY:  1. Cholecystectomy 1990.  2. Left shoulder operation 1992.  3. Right knee in 2000.  4. Hysterectomy in 1975.  5 . Resection left ovarian cyst in the 1980s.   SOCIAL HISTORY:  She does not smoke or drink.  Just retired from  Agricultural consultant.  Is going to work for [employer].   FAMILY HISTORY:  Mother is deceased at 41 of COPD.  Dad is deceased at  91 of COPD and heart disease.   REVIEW OF SYSTEMS:  Remarkable for glasses, irregular heart beat, leg  weakness, back pain, arthritis, thyroid disease and bronchitis.   PHYSICAL EXAMINATION:  HEENT:  Exam normal  limits.  She has good range  of motion of her neck.  CHEST:  Clear.  CARDIAC:  Regular rate and rhythm.  ABDOMEN:  Nontender.  No hepatosplenomegaly.  EXTREMITIES:  No clubbing, cyanosis.  GU:  Exam is deferred.  Peripheral pulses are good.  NEUROLOGICALLY:  She is awake, alert and oriented, cranial nerves  intact.  Motor exam shows 5/5 strength throughout the upper and lower  extremities.  Sensory deficit described is in a left L5 distribution.  Reflexes are two at the knees and with the ankles.  Toes downgoing  bilaterally.  She has a positive straight leg raise on the left.  MR  results have been reviewed above.   CLINICAL IMPRESSION:  Left L5 radiculopathy related to spondylosis and  cyst at L4-5.   PLAN:  Plan is for an L4-5 laminotomy, foraminotomy.  The risks and  benefits have been discussed with her, and she wishes  to proceed.   .           ______________________________  Payton Doughty, M.D.     MWR/MEDQ  D:  12/08/2006  T:  12/09/2006  Job:  [id number]

## 2010-11-05 NOTE — Discharge Summary (Signed)
Melissa Jordan, Melissa Jordan              ACCOUNT NO.:  [id number]   MEDICAL RECORD NO.:  [id number]          PATIENT TYPE:  INP   LOCATION:  3028                         FACILITY:  [facility]   PHYSICIAN:  Payton Doughty, M.D.      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   OPERATIVE PROCEDURES:  1. L4 and L5 laminectomy.  2. Diskectomy.  3. Posterior lumbar interbody fusion with right-sided fusion cages.  4. Pedicle screw fixation.  5. Posterolateral arthrodesis.   COMPLICATIONS:  None.   DISCHARGE STATUS:  Alive and well.   BODY OF TEXT:  This is a 65 year old girl whose history and physical is  recounted in the chart.  She has had a laminotomy and foraminotomy done  a couple of years ago,  did reasonably well, has developed increasing  pain and worsening spondylosis at L4-5, was admitted for fusion.  Medical history is remarkable for tachycardia.  She is on Toprol.  She  also takes Synthroid, Premarin, and Vicodin.  Rest of the H&P is in the  chart.  Neurologically, she was intact.  Strength was full.  She was  admitted after ascertaining normal laboratory values and underwent L4-5  fusions, done well.  This occurred with her last admission.  Biggest  problem was not her back but the persistent nausea and vomiting  postoperatively.  She had the difficulty for 4-5 days.  It has since  seemed to have subsided, she is eating more comfortably.  She is using  Percocet for pain.  Had a bit of constipation that has been looked after  as well.  On admission, her strength is full and incision is dry and  well healing.  She is going to get Fleet Enema.  Once that is  productive, she will be discharged home under the care of her family.  Her followup will be in the [facility] offices next week for suture  removal.           ______________________________  Payton Doughty, M.D.     MWR/MEDQ  D:  07/19/2008  T:  07/20/2008  Job:  [id number]

## 2010-11-05 NOTE — Op Note (Signed)
[name], Melissa              ACCOUNT NO.:  [id number]   MEDICAL RECORD NO.:  [id number]          PATIENT TYPE:  OIB   LOCATION:  3010                         FACILITY:  [facility]   PHYSICIAN:  Payton Doughty, M.D.      DATE OF BIRTH:  [birth year]   DATE OF PROCEDURE:  12/08/2006  DATE OF DISCHARGE:                               OPERATIVE REPORT   PREOPERATIVE DIAGNOSIS:  Spondylosis L4-5 on the left.   POSTOPERATIVE DIAGNOSIS:  Spondylosis L4-5 on the left.   OPERATIVE PROCEDURE:  Left L4-5 laminotomy, foraminotomy.   SURGEON:  Payton Doughty, M.D., Neurosurgery   ANESTHESIA:  General endotracheal.  Prep time prepped and scrubbed with  alcohol, wiped.   COMPLICATIONS:  None.   NURSE ASSISTANT:  [place]   DOCTOR ASSISTANT:  Danae Orleans. Venetia Maxon, M.D.   This is a 65 year old lady with a left L5 radiculopathy, taken to the  operating room suite and intubated, placed prone on the operating room  table.  Following shave prep and drape in the usual sterile fashion,  skin was infiltrated with 1% Lidocaine and 1:400,000 epinephrine.  The  skin was incised from mid L4 to mid L5.  Lamina of L4 and L5 were  exposed in subperiosteal plane.  Intraoperative x-ray showed her marker  to be under 3.  Laminotomy was carried out at the next lower level.  Laminotomy/foraminotomy was carried out at L4-5 with a high speed drill  to the top of ligamentum flavum that was removed in a retrograde  fashion.  This exposed the five roots that traverse this area.  It was  markedly compressed, particularly in the lateral recess.  This was  decompressed generously with a Kerrison.  Nerve root was explored and  found to be free.  Wound was irrigated and hemostasis assured.  Laminotomy defect filled with Depo-Medrol subfat.  Successive layers of  #0 Vicryl, 2-0 Vicryl and 4-0 Vicryl were used to close.  Benzoin and  Steri-Strips were placed, including Telfa and OpSite and the patient  returned to the recovery room  in good condition.           ______________________________  Payton Doughty, M.D.     MWR/MEDQ  D:  12/08/2006  T:  12/09/2006  Job:  [id number]

## 2010-11-05 NOTE — Op Note (Signed)
NAMEALEXIS, REBER              ACCOUNT NO.:  [id number]   MEDICAL RECORD NO.:  [id number]          PATIENT TYPE:  INP   LOCATION:  3312                         FACILITY:  [facility]   PHYSICIAN:  Payton Doughty, M.D.      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   POSTOPERATIVE DIAGNOSES:  1. 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  2. Fractured left L4-5 facet.   SURGEON:  Payton Doughty, MD   ANESTHESIA:  General endotracheal.   PREPARATION:  Prepped and draped with alcohol wipe.   COMPLICATIONS:  None.   NURSE ASSISTANT:  Bedelia Person, MD   DOCTOR ASSISTANT:  Cristi Loron, MD   BODY OF TEXT:  This is a 65 year old who has had a prior laminotomy and  foraminotomy done on the left-sided L4-5, had some back and bilateral  lower extremity pain that was relieved with a facet block.   Taken to the operating room, smoothly anesthetized and intubated, placed  prone on the operating table.  Following shave, prep and drape in the  usual sterile fashion, the skin was infiltrated with 1% lidocaine with  1:400,000 epinephrine.  The skin was incised from the bottom of L3 to  the top of L5.  Lamina of L4 and the transverse processes of L4 and L5  were exposed bilaterally in subperiosteal plane.  Intraoperative x-ray  confirmed correctness of level.  Having confirmed correctness of level,  starting on the right side the pars interarticularis, lamina, and  inferior facet of L4, and superior facet of L5 were removed with a high-  speed drill and Kerrison.  Nerve root was dissected free and diskectomy  carried out.  Turning attention to the left side, the facet joint was  uncovered.  It was discovered that the facet had fractured off from the  inferior aspect of the pars.  The pars was drilled, and the left L4 and  L5 roots were identified.  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  It was not feasible to get into the disk space by this  root because of severe compression; therefore, pedicle screws were  placed on the left side.  Attention was returned to the right where  through a transforaminal approach, a transverse cage BMP cage was  placed, 11 mm tall.  It had been packed with bone graft, harvested from  the facet joints.  Right-sided pedicle screws were then placed,  connected with a rod.  Final x-ray showed good placement of pedicle  screws, rod, and cage.  The transverse processes of L4 and L5 were  decorticated with a high-speed drill and packed with BMP on the extender  matrix.  Successive layers of 0 Vicryl, 2-0 Vicryl, and 3-0 nylon were  used to close.  Betadine and Telfa dressing was applied and made  occlusive with OpSite.  The patient returned to the recovery room in  good condition.           ______________________________  Payton Doughty, M.D.     MWR/MEDQ  D:  07/11/2008  T:  07/12/2008  Job:  [id number]

## 2010-11-05 NOTE — H&P (Signed)
Melissa Jordan, Melissa Jordan              ACCOUNT NO.:  [id number]   MEDICAL RECORD NO.:  [id number]          PATIENT TYPE:  INP   LOCATION:  3312                         FACILITY:  [facility]   PHYSICIAN:  Payton Doughty, M.D.      DATE OF BIRTH:  09-Jun-1946   DATE OF ADMISSION:  07/11/2008  DATE OF DISCHARGE:                              HISTORY & PHYSICAL   ADMITTING DIAGNOSIS:  Spondylosis, L4-5.   SURGEON:  Payton Doughty, MD   BODY OF TEST:  A very nice now 65 year old left-handed white lady who  has been having back pain for several years, had increasing pain down  her left leg.  Underwent an MR that shows spondylosis at L4-5, underwent  a block that was helpful and she is now admitted for fusion at L4-5.   Medical history is remarkable for idiopathic tachycardia.  She treats  with Toprol 50 mg a day.   She takes Synthroid, Premarin, Vicodin, and Motrin on a p.r.n. basis.   She is allergic to MORPHINE and DARVOCET.   SURGICAL HISTORY:  Cholecystectomy in 1990, left shoulder operation in  1992, knee in 2000, hysterectomy in 1975, and left ovarian cyst in 1980.   SOCIAL HISTORY:  She does not smoke, does not drink and is working as a  Engineer, civil (consulting) for [employer].  We will take care of her anyway.   FAMILY HISTORY:  Her mother is deceased at 67 of COPD.  Her father is  deceased at 26 of COPD and heart disease.   REVIEW OF SYSTEMS:  Remarkable for glasses, irregular heart rate, leg  weakness, back pain, arthritis, thyroid disease, and bronchitis.   PHYSICAL EXAMINATION:  HEENT:  Normal limits.  NECK:  She has a good range of motion in neck.  CHEST:  Clear.  CARDIAC:  Regular rate and rhythm.  NEUROLOGIC:  She is awake, alert, and oriented.  Cranial nerves are  intact.  Motor exam shows 5/5 strength throughout the upper extremities  and lower extremities.  No current sensory deficit.  Reflexes 2 at the  knees, 1 at the ankle.  Toes are downgoing bilaterally.  She has a  positive  straight leg raise on the left.   MR shows spondylosis at L4-5 and synovial cyst.  Her block at L4-5 was  helpful for her leg pain.   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   PLAN:  For lumbar fusion, cages and pedicle screws as needed.  The risks  and benefits have been discussed with her and she wished to proceed.           ______________________________  Payton Doughty, M.D.     MWR/MEDQ  D:  07/11/2008  T:  07/11/2008  Job:  [id number]

## 2010-11-08 ENCOUNTER — Other Ambulatory Visit: Payer: Self-pay | Admitting: Neurosurgery

## 2010-11-08 DIAGNOSIS — M47816 Spondylosis without myelopathy or radiculopathy, lumbar region: Secondary | ICD-10-CM

## 2010-11-08 NOTE — Op Note (Signed)
[place]  Patient:    Melissa Jordan, Melissa Jordan Visit Number: [id number] MRN: [id number]          Service Type: MED Location: [phone] Attending Physician:  Luis Abed Proc. Date: 02/10/01 Adm. Date:  02/10/2001                             Operative Report  PREOPERATIVE DIAGNOSIS:  Right submandibular and submental space abscess secondary to abscessed tooth #31.  POSTOPERATIVE DIAGNOSIS:  Right submandibular and submental space abscess secondary to abscessed tooth #31.  OPERATION PERFORMED:  Submental incision and drainage and insertion of 1/4 inch Penrose and aerobic and anaerobic cultures.  SURGEON:  Dionne Ano. Gwyneth Sprout., D.D.S.  ANESTHESIA:  INDICATIONS FOR PROCEDURE:  This 65 year old female had an abscessed tooth #31 two days ago that did not respond to oral antibiotics.  The tooth was removed on February 09, 2001 and subsequently the patient continued to develop significant right submandibular and submental swelling, had difficulty swallowing.  She could not swallow liquids and could not swallow medication. She was admitted for IV antibiotics and incision and drainage.  DESCRIPTION OF PROCEDURE:  The patient was brought to the operating suite and placed in supine position on the operating table.  Care was taken in administering orotracheal intubation which was completed without displacing purulent exudate and the patient had reasonably oral opening and good visualization of the cords.  After satisfactory orotracheal anesthesia was administered, a shoulder roll was placed under the shoulders and the submandibular and submental spaces and neck were prepped with Betadine scrub, Betadine pain and draped four towels plus sheet.  Using 0.5% Marcaine with 1:200,000 epinephrine the area of the submental and submandibular space was infiltrated with a local anesthesia to help with hemostasis.  Using a 15 scalpel blade, an  incision was made horizontally in the submental space at the area of greatest fluctuance.  Incision was carried through the skin and subcutaneous tissue.  Using blunt dissection with a hemostat, the dissection was carried through the mylohyoid muscle and a moderate amount of purulent exudate was elicited from the wound.  Dissection was carried out along the medial aspect of the mandible posterior to the angle of the mandible and the incision was thoroughly irrigated with saline, Betadine and repeated with saline.  A 1/4 inch Penrose drain was inserted along the medial aspect of the mandible using approximately three to four inches of drain and the drain was sutured with a 3-0 silk suture to the skin edges.  The incision area was left open for drainage and dressed with several 4 x 4 gauze.  Intraoral inspection showed minimal purulence or drainage orally and the airway remained clear.  The drapes were removed, the patient was awakened and extubated in the operating room and then transported to the recovery room in stable condition. She tolerated the surgery and the anesthesia without complication.  The patient will be returned to the floor for careful monitoring of the upper airway and continue on IV antibiotics and IV fluids until the patient can transition to oral intake.  The drains will be advanced as necessary.  ESTIMATED BLOOD LOSS:  Minimal.  DRAINS:  1/4 inch Penrose drain in the right submandibular area.  CULTURES:  Aerobic and anaerobic cultures were taken of the purulent exudate and for cultures and Gram stains.  COMPLICATIONS:  None. Attending Physician:  Luis Abed DD:  02/10/01 TD:  02/11/01 Job: [id number]

## 2010-11-08 NOTE — Discharge Summary (Signed)
Melissa Jordan, Melissa Jordan              ACCOUNT NO.:  [id number]   MEDICAL RECORD NO.:  [id number]          PATIENT TYPE:  INP   LOCATION:  3028                         FACILITY:  [facility]   PHYSICIAN:  Payton Doughty, M.D.      DATE OF BIRTH:  1945/09/22   DATE OF ADMISSION:  12/08/2006  DATE OF DISCHARGE:  12/16/2006                               DISCHARGE SUMMARY   ADMITTING DIAGNOSIS:  Spondylosis L4-5.   DISCHARGE DIAGNOSIS:  Spondylosis L4-5.   OPERATIVE PROCEDURE:  L4-5 laminotomy, foraminotomy done bilaterally.   SERVICE:  neurosurgery.   COMPLICATIONS:  Protracted nausea.   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  She was admitted for decompression.   PAST MEDICAL HISTORY:  Remarkable for idiopathic tachycardia.   MEDICATIONS:  Toprol, Synthroid, Premarin,  and Vicodin.   ALLERGIES:  She is allergic to MORPHINE and DARVOCET.   PHYSICAL EXAMINATION:  GENERAL:  Exam was intact.  NEUROLOGIC:  Exam was intact save for a left L5 radiculopathy.   She was admitted after ascertaining normal laboratory values and  underwent left L4-5 laminotomy and foraminotomy with removal of a  synovial cyst.  Postoperatively, she did very well regarding her  neurologic status.  Her leg pain was gone.  Her strength was full.  She  developed nausea and vomiting thought to be related to pain medications.  Pain medications were stopped.  She was still having nausea and  vomiting.  She was given some Zofran.  It persisted.  She was given some  Phenergan. Nausea and vomiting diminished.   By June 23, she was able to eat, had no vertigo.  By June 24, she was  eating well, and on June 25 she was finally able to eat, not feel  nauseated, kept everything down, and was discharged home.   Her followup will be in the [facility] office in a week for sutures.   .            ______________________________  Payton Doughty, M.D.     MWR/MEDQ  D:  02/09/2007  T:  02/09/2007  Job:  [id number]

## 2010-11-08 NOTE — Op Note (Signed)
NAMEKIMMERLY, Melissa Jordan                        ACCOUNT NO.:  [id number]   MEDICAL RECORD NO.:  [id number]                   PATIENT TYPE:  AMB   LOCATION:  DSC                                  FACILITY:  [facility]   PHYSICIAN:  Mila Homer. Sherlean Foot, M.D.              DATE OF BIRTH:  1946-05-08   DATE OF PROCEDURE:  07/19/2003  DATE OF DISCHARGE:                                 OPERATIVE REPORT   PREOPERATIVE DIAGNOSIS:  Left knee medial meniscal tear.   POSTOPERATIVE DIAGNOSIS:  Left knee medial meniscal tear and osteoarthritis.   OPERATION PERFORMED:  Left knee partial medial meniscectomy, chondroplasty  of the patellofemoral joint and plica debridement.   SURGEON:  Mila Homer. Sherlean Foot, M.D.   ANESTHESIA:  General.   INDICATIONS FOR PROCEDURE:  The patient is a 65 year old who would not have  an MRI scan due to claustrophobia, however, mechanical symptoms made it  obvious that she had a medial meniscal tear.  I then took her to the  operating room under informed consent.   DESCRIPTION OF PROCEDURE:  The patient was taken to the operating room.  The  left lower extremity was prepped and draped in the usual sterile fashion  following general anesthesia.  Inferolateral and inferomedial portals were  created with an 11 blade, blunt trocar and cannula.  Diagnostic arthroscopy  revealed a large medial synovitic plica.  This was debrided with a Sales executive debridement wand.  I then went into the medial  compartment and she had a very, very extensive posterior horn medial  meniscal tear.  I used straight and curved basket forceps and a [place]  shaver to perform a very aggressive posterior horn partial medial  meniscectomy.  I then performed a chondroplasty on the medial femoral  condyle where there were some grade 2 and 3 changes in the extension portion  of the condyle.  I then went into a figure 4 position.  The lateral  compartment was pristine.  I then went into  extension, completed the plica  debridement and irrigated the knee.  I then checked the meniscus one last  time and then evacuated the joint of fluid and instruments.  I closed with  interrupted 4-0 nylon sutures, dressed with Adaptic, 4 x 4, sterile Webril  and Ace wrap.   COMPLICATIONS:  None.   DRAINS:  None.                                               Mila Homer. Sherlean Foot, M.D.    SDL/MEDQ  D:  07/19/2003  T:  07/19/2003  Job:  [id number]

## 2010-11-08 NOTE — H&P (Signed)
[place]  Patient:    Melissa Jordan, Melissa Jordan Visit Number: [id number] MRN: [id number]          Service Type: MED Location: [phone] Attending Physician:  Luis Abed Dictated by:   Dionne Ano. Gwyneth Sprout., D.D.S. Adm. Date:  02/10/2001                           History and Physical  CHIEF COMPLAINT: This 65 year old female had developed significant swelling and pain and difficulty swallowing in the lower jaw secondary to an abscessed tooth #31.  HISTORY OF PRESENT ILLNESS: Two days ago the patient developed pain and swelling in the submandibular area and went to her family dentist, who identified tooth #31 to be acutely and chronically abscessed.  He started her on clindamycin 300 mg q.i.d. and the patient had minimal response overnight, and was referred to our office on February 09, 2001, and under IV sedation and local anesthesia tooth #31 was removed and drained of purulent exudate.  The patient was continued on clindamycin.  She continued to show worsening of her submanibular swelling and had great difficulty swallowing, and could not take her medications and could not take fluids, and was admitted for evaluation with CT evaluation and extraoral incision and drainage, and IV antibiotics and fluid support.  PAST MEDICAL HISTORY:  1. Idiopathic supraventricular tachycardia.  2. Hypertension, medication controlled.  CURRENT MEDICATIONS:  1. Lopressor 50 mg h.s.  2. Prinivil 25 mg as needed.  3. ______ 0.3 mg h.s.  4. Clindamycin 300 mg q.i.d.  ALLERGIES (causes skin irritation):  1. MORPHINE.  2. CODEINE.  SOCIAL HISTORY: She is employed as a Engineer, site.  Does not drink, does not use alcohol regularly.  PHYSICAL EXAMINATION:  VITAL SIGNS: Temperature 96.5 degrees, pulse 102, blood pressure 155/92.  GENERAL APPEARANCE: Well-developed 65 year old old female, who appears her stated age, and complains of difficulty  swallowing and has to stay in an upright position to maintain fluid in her mouth.  HEENT: Head normocephalic, equal distribution of hair.  Eyes, PERRLA.  EOMI. Funduscopic not done.  Ears, canals clear.  Nose, no external deformity. Airway clear.  Face, normal examination.  There is +2 swelling of the right submandibular area and submental area.  Extreme tenderness of the submental area.  Intraorally she has a fresh extraction site in the area of tooth #31. There is minimal purulent exudate from the site.  She has slight elevation of the floor of the mouth and tongue, and congestion of the right submandibular duct.  Oropharynx cannot be visualized.  NECK: Supple, without palpable nodes or thyroid.  CHEST: Upper airway clear.  Lungs clear to auscultation and percussion.  HEART: Regular sinus rhythm without murmurs or gallops.  ABDOMEN: Normal bowel sounds without tenderness, guarding, or organomegaly.  EXTREMITIES: Full range of motion without clubbing, cyanosis, or edema.  NEUROLOGIC: Cranial nerves 2-12 grossly intact.  Sensory and motor function intact.  BREAST/RECTAL/GU: Examinations deferred.  SKIN: Warm and dry.  No rashes.  LABORATORY DATA: Panorex x-ray shows fresh post extraction socket in area of tooth #31, no significant radiolucency of the mandible; no evidence of osteomyelitis; remaining teeth in good dental repair.  IMPRESSION/PLAN:  1. Right submandibular and submental space abscess and cellulitis secondary     to carious abscess of tooth #31, status post extraction.  Obtain CT scan     of the submandibular area to evaluate for airway  obstruction and tracheal     impingement and abscess formation.  IV antibiotics with clindamycin 600 mg     t.i.d.  Heat to the submandibular area.  Monitor upper airway.  Support     with intravenous fluids until patient can start taking fluids by mouth.  2. Essentially hypertension, controlled with Lopressor 50 mg h.s.      Continue present medication and monitor pressure.  3. Idiopathic supraventricular tachycardia.  Presently this has not been a     problem but will monitor heart rate and complete an electrocardiogram     prior to surgical recommendations. Dictated by:   Dionne Ano. Gwyneth Sprout., D.D.S. Attending Physician:  Luis Abed DD:  02/10/01 TD:  02/11/01 Job: [id number]

## 2010-11-08 NOTE — Discharge Summary (Signed)
[place]. [facility]  Patient:    Melissa Jordan, Melissa Jordan Visit Number: [id number] MRN: [id number]          Service Type: MED Location: [phone] Attending Physician:  Luis Abed Dictated by:   Dionne Ano. Gwyneth Sprout., D.D.S. Adm. Date:  02/10/2001 Disc. Date: 02/18/01                             Discharge Summary  REASON FOR ADMISSION:   This 65 year old female was admitted to [facility] on February 10, 2001, with a right submandibular space abscess, submental space abscess with impending Ludwigs angina secondary to abscess tooth #31.  Her chief complaint on admission was that she could not swallow food or pills, could not take her medication or food and had extreme pain and swelling in her lower jaw.  HISTORY OF PRESENT ILLNESS:  This 65 year old female had developed a toothache and swelling in her right lower jaw several days prior to admission and consulted her family dentist on February 08, 2001, who started her on clindamycin.  The next day her swelling was worse and she was referred to our office and tooth #31 was identified as abscess with a purulent drainage and was removed under IV sedation and local anesthesia.  The patient continued to have increased swelling of the submandibular and submental spaces and could not swallow her medication, was not taking fluid and required hospital admission the next day on February 10, 2001.  PAST MEDICAL HISTORY:  The patient also has a history of essential hypertension and ____________ supraventricular tachycardia.  MEDICATIONS ON ADMISSION: 1. Lopressor 50 mg daily. 2. Prinivil 25 mg as needed. 3. Clindamycin 300 mg q.i.d. 4. Premarin 0.3 mg h.s.  PHYSICAL FINDINGS ON ADMISSION:  Extensive right submandibular space and submental space swelling, extreme tenderness, significant trismus with limitation of range of motion, mild swelling of the floor of the mouth, and difficulty  with swallowing.  HOSPITAL COURSE:  A CT scan was completed on admission which showed extensive swelling of the submandibular area but no airway impingement and a void area in the submental area.  The patient was taken to surgery on the day of admission and under general anesthesia, had an ________ incision and drainage was completed in the submental space and submandibular space and Penrose drains were placed.  Cultures were taken for anaerobic and aerobic cultures and Gram stains.  Infectious disease consult was received and the patients antibiotics were changed from clindamycin to meropenem every eight hours intravenously.  The patient was given intravenous fluids and developed moderate both infraoral and extraoral drainage of the submandibular and submental spaces.  Subsequently, the patient developed significant nausea and vomiting for the first and second post admission days.  Her antibiotics were changed to Unasyn and the patient developed left-to-right imbalance and medical consult was obtained from Petra Kuba, M.D., and she was diagnosed as having hypokalemia and dehydration. Additional fluids were given intravenously and she also developed gastric regurgitation and was started on treatment for this problem.  The patient was weaned off of narcotic medication and began taking oral fluids.  She had a decrease of the submandibular and submental space swelling and her infection was controlled as she became afebrile.  A follow-up CT scan February 16, 2001, showed that her swelling had subsided and no abscess formation was then noted and her drains were advanced and removed.  Today  the patient is taking oral soft diet reasonably well. She still complains of mild nausea but has not had any vomiting in 48 hours and is tolerating food intake well both from swallowing and nausea concern.  The patient is presently discharged in stable condition with her infection under control. She will  be followed as an outpatient for her gastric regurgitation. She is taking fluids and food reasonably well.  Her dehydration and hypokalemia have been corrected.  FINAL DIAGNOSES: 1. Right submandibular and submental space abscesses secondary to abscess    tooth #31, controlled with surgery and antibiotics. 2. History of supraventricular _________ tachycardia, stable. 3. Hypertension, stable with medication. 4. Dehydration corrected. 5. Hypokalemia corrected. 6. Gastric regurgitation responding to medication.  DISCHARGE MEDICATIONS: 1. Augmentin 500 mg three times daily for nine days. 2. Chlorhexidine rinse 10 cc rinse and spit out twice daily. 3. Lopressor 50 mg once daily. 4. Maalox 5 cc every six hours. 5. Protonix 40 mg twice daily.  FOLLOW-UP:  The patient will follow up in our office for her submandibular space infection and she will be followed by her family physician for her gastric regurgitation and nausea and general medical problems. Dictated by:   Dionne Ano. Gwyneth Sprout., D.D.S. Attending Physician:  Luis Abed DD:  02/17/01 TD:  02/17/01 Job: [phone]

## 2010-11-08 NOTE — Discharge Summary (Signed)
[facility]  Patient:    Melissa Jordan, Melissa Jordan                     MRN: [id number] Adm. Date:  [id number] Disc. Date: [id number] Attending:  Feliciana Rossetti                           Discharge Summary  DISCHARGE DIAGNOSES: 1. Chronic obstructive pulmonary disease exacerbation. 2. Hypovolemia.  REASON FOR HOSPITALIZATION:  Chronic obstructive pulmonary disease exacerbation.  HOSPITAL COURSE:  The patient was admitted through the emergency room following a failure of outpatient therapy for chronic obstructive pulmonary disease exacerbation.  The patient has been hospitalized with stereotype symptom complex in the past for which she has had a 1 to 2 week stay on greater than one occasion.  The patient has developed in the past a severe cough that has preventing the patient from eating, and has also caused nausea and vomiting, had led to dehydration in the past.  The patient was admitted to a general medical bed, and intravenous Demerol was used for pleurodynia, and antitussive effects.  The patient was also begun on intravenous Solu-Medrol t.i.d. for marked bronchospasm.  The patient responded favorably, albeit, very slowly to these measures, and by August 19, 2000, the patient was felt ready for discharge to home.  Additionally, the patient was treated with empiric antibiotics for this respiratory infection.  LABORATORY DATA:  Potassium 3.4 on August 12, 2000.  DISCHARGE DIAGNOSIS:  Hypopotassemia.  CONDITION ON DISCHARGE:  The patient was much improved appearing, ambulating with only minimal shortness of breath, and her cough was well controlled.  DISCHARGE MEDICATIONS: 1. Medrol DosePak. 2. Seroquel 100 mg q.h.s. for sleep.  FOLLOWUP:  Dr. Quintella Reichert two weeks following discharge. DD:  09/02/00 TD:  09/02/00 Job: [id number]

## 2010-11-14 ENCOUNTER — Ambulatory Visit
Admission: RE | Admit: 2010-11-14 | Discharge: 2010-11-14 | Disposition: A | Discharge status: Home / Self Care | Payer: 59 | Source: Ambulatory Visit | Attending: Neurosurgery | Admitting: Neurosurgery

## 2010-11-14 DIAGNOSIS — M47816 Spondylosis without myelopathy or radiculopathy, lumbar region: Secondary | ICD-10-CM

## 2010-11-19 ENCOUNTER — Other Ambulatory Visit: Payer: Self-pay | Admitting: Family Medicine

## 2010-12-31 ENCOUNTER — Ambulatory Visit: Payer: 59 | Admitting: Family Medicine

## 2011-01-23 ENCOUNTER — Encounter: Payer: Self-pay | Admitting: Family Medicine

## 2011-01-29 ENCOUNTER — Encounter: Payer: Self-pay | Admitting: Family Medicine

## 2011-01-29 ENCOUNTER — Ambulatory Visit (INDEPENDENT_AMBULATORY_CARE_PROVIDER_SITE_OTHER): Payer: Medicare Other | Admitting: Family Medicine

## 2011-01-29 ENCOUNTER — Other Ambulatory Visit (HOSPITAL_COMMUNITY)
Admission: RE | Admit: 2011-01-29 | Discharge: 2011-01-29 | Disposition: A | Discharge status: Home / Self Care | Payer: Medicare Other | Source: Ambulatory Visit | Attending: Family Medicine | Admitting: Family Medicine

## 2011-01-29 VITALS — BP 114/72 | HR 86 | Temp 98.1°F | Ht 63.5 in | Wt 154.0 lb

## 2011-01-29 DIAGNOSIS — R3915 Urgency of urination: Secondary | ICD-10-CM

## 2011-01-29 DIAGNOSIS — E559 Vitamin D deficiency, unspecified: Secondary | ICD-10-CM

## 2011-01-29 DIAGNOSIS — Z01419 Encounter for gynecological examination (general) (routine) without abnormal findings: Secondary | ICD-10-CM | POA: Insufficient documentation

## 2011-01-29 DIAGNOSIS — I10 Essential (primary) hypertension: Secondary | ICD-10-CM

## 2011-01-29 DIAGNOSIS — E785 Hyperlipidemia, unspecified: Secondary | ICD-10-CM

## 2011-01-29 DIAGNOSIS — D649 Anemia, unspecified: Secondary | ICD-10-CM

## 2011-01-29 DIAGNOSIS — N959 Unspecified menopausal and perimenopausal disorder: Secondary | ICD-10-CM

## 2011-01-29 DIAGNOSIS — E039 Hypothyroidism, unspecified: Secondary | ICD-10-CM

## 2011-01-29 DIAGNOSIS — Z0000 Encounter for general adult medical examination without abnormal findings: Secondary | ICD-10-CM

## 2011-01-29 LAB — POCT URINALYSIS DIPSTICK
Bilirubin, UA: NEGATIVE
Glucose, UA: NEGATIVE
Ketones, UA: NEGATIVE
Leukocytes, UA: NEGATIVE
Nitrite, UA: NEGATIVE
Protein, UA: NEGATIVE
Spec Grav, UA: 1.025
Urobilinogen, UA: 0.2
pH, UA: 6

## 2011-01-29 LAB — CBC WITH DIFFERENTIAL/PLATELET
Basophils Absolute: 0 10*3/uL (ref 0.0–0.1)
Basophils Relative: 0.4 % (ref 0.0–3.0)
Eosinophils Absolute: 0.2 10*3/uL (ref 0.0–0.7)
Eosinophils Relative: 2.2 % (ref 0.0–5.0)
HCT: 38.5 % (ref 36.0–46.0)
Hemoglobin: 12.9 g/dL (ref 12.0–15.0)
Lymphocytes Relative: 24.8 % (ref 12.0–46.0)
Lymphs Abs: 1.7 10*3/uL (ref 0.7–4.0)
MCHC: 33.6 g/dL (ref 30.0–36.0)
MCV: 89.8 fl (ref 78.0–100.0)
Monocytes Absolute: 0.5 10*3/uL (ref 0.1–1.0)
Monocytes Relative: 7.8 % (ref 3.0–12.0)
Neutro Abs: 4.4 10*3/uL (ref 1.4–7.7)
Neutrophils Relative %: 64.8 % (ref 43.0–77.0)
Platelets: 237 10*3/uL (ref 150.0–400.0)
RBC: 4.28 Mil/uL (ref 3.87–5.11)
RDW: 13.5 % (ref 11.5–14.6)
WBC: 6.8 10*3/uL (ref 4.5–10.5)

## 2011-01-29 LAB — HEPATIC FUNCTION PANEL
ALT: 17 U/L (ref 0–35)
AST: 20 U/L (ref 0–37)
Albumin: 3.9 g/dL (ref 3.5–5.2)
Alkaline Phosphatase: 60 U/L (ref 39–117)
Bilirubin, Direct: 0 mg/dL (ref 0.0–0.3)
Total Bilirubin: 0.6 mg/dL (ref 0.3–1.2)
Total Protein: 6.4 g/dL (ref 6.0–8.3)

## 2011-01-29 LAB — LIPID PANEL
Cholesterol: 215 mg/dL — ABNORMAL HIGH (ref 0–200)
HDL: 60.5 mg/dL (ref >39.00)
Total CHOL/HDL Ratio: 4
Triglycerides: 108 mg/dL (ref 0.0–149.0)
VLDL: 21.6 mg/dL (ref 0.0–40.0)

## 2011-01-29 LAB — BASIC METABOLIC PANEL
BUN: 20 mg/dL (ref 6–23)
CO2: 26 mEq/L (ref 19–32)
Calcium: 8.5 mg/dL (ref 8.4–10.5)
Chloride: 108 mEq/L (ref 96–112)
Creatinine, Ser: 0.6 mg/dL (ref 0.4–1.2)
GFR: 108.72 mL/min (ref >60.00)
Glucose, Bld: 90 mg/dL (ref 70–99)
Potassium: 3.7 mEq/L (ref 3.5–5.1)
Sodium: 141 mEq/L (ref 135–145)

## 2011-01-29 LAB — LDL CHOLESTEROL, DIRECT: Direct LDL: 142.6 mg/dL

## 2011-01-29 LAB — TSH: TSH: 1.4 u[IU]/mL (ref 0.35–5.50)

## 2011-01-29 MED ORDER — ESTRADIOL 1 MG PO TABS: 1.0000 mg | ORAL_TABLET | Freq: Every day | ORAL | Status: DC

## 2011-01-29 MED ORDER — METOPROLOL TARTRATE 50 MG PO TABS: 50.0000 mg | ORAL_TABLET | Freq: Two times a day (BID) | ORAL | Status: DC

## 2011-01-29 NOTE — Patient Instructions (Signed)
Other than backproblem you check out fine Will call results and lab Have renewed medications Schedule bone density time of the mammogram

## 2011-01-30 LAB — VITAMIN D 25 HYDROXY (VIT D DEFICIENCY, FRACTURES): Vit D, 25-Hydroxy: 40 ng/mL (ref 30–89)

## 2011-01-31 ENCOUNTER — Encounter: Payer: Self-pay | Admitting: Family Medicine

## 2011-01-31 NOTE — Progress Notes (Signed)
  Subjective:    Patient ID: Melissa Jordan, female    DOB: 05/01/1946, 65 y.o.   MRN: [id number] This 65 year old white married female who is now disabled due to back problems, a retired Engineer, civil (consulting) she has had 3 surgeries to her back infusions but continues to have pain on the wording a bone stimulator she relates that during the winter she has a episode of pneumonia was [facility] and cleared her well. In regard to the back pain she continues to utilize Percocet when necessary for pain. Needs a Pap smear on this visit has had problems with her right knee bend swallowing painful has been treated by Dr. Audery Amel immunizations needed  pneumonia shot HPI she had postmenopausal syndrome symptoms in the past and is continued to be on Estrace 1 mg each day. Hypertension has been well-controlled at 114/70 today in regard to the back problems patient has complained of abnormal gait when walking in addition her pain also pain on Barrett's movement Review of other systems negative    Review of Systems CHPI    Objective:   Physical Exam vital signs normal, the patient is a well-built well-nourished white female in no distress pleasant alert cooperative HEENT no positive findings including normal carotid pulses thyroid is nonpalpable Heart normal size no cardiomegaly no murmurs regular rhythm no gallop Lungs clear to palpation percussion and auscultation no rales heard no dullness no wheezing Breast no masses palpable nipples everted no tenderness axilla clear Abdomen liver spleen kidneys are nonpalpable no masses felt no tenderness bowel sounds normal Pelvic examination reveals normal external introitus vaginal mucosa negative cervix negative normal size uterus adnexal ears clear rectal examination is normal Pap smear done Spine laminectomy scar Extremities negative knee is not tender today nor febrile Skin        Assessment & Plan:  Chronic back pain despite fusions and 3 surgeries continue  ibuprofen and Percocet Hypertension well controlled continue same medication i Postmenopausal syndrome continue Estrace 1 mg daily

## 2011-02-03 NOTE — Progress Notes (Signed)
Quick Note:  Pt aware ______ 

## 2011-02-05 NOTE — Progress Notes (Signed)
Quick Note:  Pt aware ______ 

## 2011-03-20 LAB — CREATININE, SERUM
Creatinine, Ser: 0.67
GFR calc Af Amer: >60
GFR calc non Af Amer: >60

## 2011-04-09 LAB — ABO/RH: ABO/RH(D): O POS

## 2011-04-09 LAB — TYPE AND SCREEN
ABO/RH(D): O POS
Antibody Screen: NEGATIVE

## 2011-04-10 LAB — COMPREHENSIVE METABOLIC PANEL
ALT: 19
AST: 18
Albumin: 3.6
Alkaline Phosphatase: 56
BUN: 13
CO2: 29
Calcium: 9.3
Chloride: 104
Creatinine, Ser: 0.59
GFR calc Af Amer: >60
GFR calc non Af Amer: >60
Glucose, Bld: 87
Potassium: 3.7
Sodium: 139
Total Bilirubin: 0.8
Total Protein: 6.1

## 2011-04-10 LAB — URINALYSIS, ROUTINE W REFLEX MICROSCOPIC
Bilirubin Urine: NEGATIVE
Glucose, UA: NEGATIVE
Hgb urine dipstick: NEGATIVE
Ketones, ur: NEGATIVE
Leukocytes, UA: NEGATIVE
Nitrite: NEGATIVE
Protein, ur: NEGATIVE
Specific Gravity, Urine: 1.013 (ref 1.005–1.035)
Urobilinogen, UA: 0.2
pH: 6

## 2011-04-10 LAB — CBC
HCT: 38.5
Hemoglobin: 13
MCHC: 33.8
MCV: 87.9
Platelets: 181
RBC: 4.38
RDW: 13.6
WBC: 8

## 2011-04-10 LAB — DIFFERENTIAL
Basophils Absolute: 0
Basophils Relative: 1
Eosinophils Absolute: 0.1
Eosinophils Relative: 1
Lymphocytes Relative: 25
Lymphs Abs: 2
Monocytes Absolute: 0.7
Monocytes Relative: 9
Neutro Abs: 5.2
Neutrophils Relative %: 65

## 2011-04-10 LAB — PROTIME-INR
INR: 1
Prothrombin Time: 13.1

## 2011-04-10 LAB — APTT: aPTT: 33

## 2011-04-18 ENCOUNTER — Other Ambulatory Visit: Payer: Self-pay | Admitting: Family Medicine

## 2011-04-18 DIAGNOSIS — Z1231 Encounter for screening mammogram for malignant neoplasm of breast: Secondary | ICD-10-CM

## 2011-05-20 ENCOUNTER — Ambulatory Visit
Admission: RE | Admit: 2011-05-20 | Discharge: 2011-05-20 | Disposition: A | Discharge status: Home / Self Care | Payer: Medicare Other | Source: Ambulatory Visit | Attending: Family Medicine | Admitting: Family Medicine

## 2011-05-20 DIAGNOSIS — M8580 Other specified disorders of bone density and structure, unspecified site: Secondary | ICD-10-CM

## 2011-05-20 DIAGNOSIS — Z1231 Encounter for screening mammogram for malignant neoplasm of breast: Secondary | ICD-10-CM

## 2011-05-26 ENCOUNTER — Other Ambulatory Visit: Payer: Self-pay | Admitting: Family Medicine

## 2011-05-26 DIAGNOSIS — R928 Other abnormal and inconclusive findings on diagnostic imaging of breast: Secondary | ICD-10-CM

## 2011-05-28 ENCOUNTER — Ambulatory Visit (INDEPENDENT_AMBULATORY_CARE_PROVIDER_SITE_OTHER): Payer: Medicare Other

## 2011-05-28 DIAGNOSIS — R05 Cough: Secondary | ICD-10-CM

## 2011-05-28 DIAGNOSIS — R059 Cough, unspecified: Secondary | ICD-10-CM

## 2011-06-10 ENCOUNTER — Ambulatory Visit
Admission: RE | Admit: 2011-06-10 | Discharge: 2011-06-10 | Disposition: A | Discharge status: Home / Self Care | Payer: Medicare Other | Source: Ambulatory Visit | Attending: Family Medicine | Admitting: Family Medicine

## 2011-06-10 ENCOUNTER — Other Ambulatory Visit: Payer: Self-pay | Admitting: Family Medicine

## 2011-06-10 DIAGNOSIS — R928 Other abnormal and inconclusive findings on diagnostic imaging of breast: Secondary | ICD-10-CM

## 2012-01-15 ENCOUNTER — Ambulatory Visit (INDEPENDENT_AMBULATORY_CARE_PROVIDER_SITE_OTHER): Payer: Medicare Other | Admitting: Internal Medicine

## 2012-01-15 VITALS — BP 142/80 | HR 73 | Temp 98.2°F | Resp 16 | Ht 62.5 in | Wt 154.6 lb

## 2012-01-15 DIAGNOSIS — R059 Cough, unspecified: Secondary | ICD-10-CM

## 2012-01-15 DIAGNOSIS — J040 Acute laryngitis: Secondary | ICD-10-CM

## 2012-01-15 DIAGNOSIS — R05 Cough: Secondary | ICD-10-CM

## 2012-01-15 MED ORDER — HYDROCODONE-ACETAMINOPHEN 7.5-500 MG/15ML PO SOLN: 5.0000 mL | Freq: Four times a day (QID) | ORAL | Status: AC | PRN

## 2012-01-15 MED ORDER — AZITHROMYCIN 250 MG PO TABS: ORAL_TABLET | ORAL | Status: DC

## 2012-01-15 NOTE — Patient Instructions (Addendum)
Laryngitis  At the top of your windpipe is your voice box. It is the source of your voice. Inside your voice box are 2 bands of muscles called vocal cords. When you breathe, your vocal cords are relaxed and open so that air can get into the lungs. When you decide to say something, these cords come together and vibrate. The sound from these vibrations goes into your throat and comes out through your mouth as sound.  Laryngitis is an inflammation of the vocal cords that causes hoarseness, cough, loss of voice, sore throat, and dry throat. Laryngitis can be temporary (acute) or long-term (chronic). Most cases of acute laryngitis improve with time.Chronic laryngitis lasts for more than 3 weeks.  CAUSES  Laryngitis can often be related to excessive smoking, talking, or yelling, as well as inhalation of toxic fumes and allergies. Acute laryngitis is usually caused by a viral infection, vocal strain, measles or mumps, or bacterial infections. Chronic laryngitis is usually caused by vocal cord strain, vocal cord injury, postnasal drip, growths on the vocal cords, or acid reflux.  SYMPTOMS    Cough.   Sore throat.   Dry throat.  RISK FACTORS   Respiratory infections.   Exposure to irritating substances, such as cigarette smoke, excessive amounts of alcohol, stomach acids, and workplace chemicals.   Voice trauma, such as vocal cord injury from shouting or speaking too loud.  DIAGNOSIS   Your cargiver will perform a physical exam. During the physical exam, your caregiver will examine your throat. The most common sign of laryngitis is hoarseness. Laryngoscopy may be necessary to confirm the diagnosis of this condition. This procedure allows your caregiver to look into the larynx.  HOME CARE INSTRUCTIONS   Drink enough fluids to keep your urine clear or pale yellow.   Rest until you no longer have symptoms or as directed by your caregiver.   Breathe in moist air.   Take all medicine as directed by your  caregiver.   Do not smoke.   Talk as little as possible (this includes whispering).   Write on paper instead of talking until your voice is back to normal.   Follow up with your caregiver if your condition has not improved after 10 days.  SEEK MEDICAL CARE IF:    You have trouble breathing.   You cough up blood.   You have persistent fever.   You have increasing pain.   You have difficulty swallowing.  MAKE SURE YOU:   Understand these instructions.   Will watch your condition.   Will get help right away if you are not doing well or get worse.  Document Released: 06/09/2005 Document Revised: 05/29/2011 Document Reviewed: 08/15/2010  [employer] Patient Information 2012 [employer].

## 2012-01-15 NOTE — Progress Notes (Signed)
  Subjective:    Patient ID: Melissa Jordan, female    DOB: [birth year], 66 y.o.   MRN: [id number]  HPI Has cough, hoarseness, no sob or fever. No cp. New bug.   Review of Systems Neg    Objective:   Physical Exam Throat red Nose runny clear Lungs clear Heart slow and steady       Assessment & Plan:  Zpak, Lortab elixir

## 2012-02-12 ENCOUNTER — Telehealth: Payer: Self-pay | Admitting: Family Medicine

## 2012-02-12 NOTE — Telephone Encounter (Signed)
I contacted pt to reschedule her appt from 9/20 to 10/7. Patient states she will run out of her estradiol that Dr. Scotty Court used to write for her and she would like to know if we can give her a refill to last until her appt. I advised pt that we usually do not write rx for patients that have not been seen at this office, but that I would double check with the doctor. Please advise.

## 2012-02-12 NOTE — Telephone Encounter (Signed)
Ok for 1 month of meds since we delayed her new pt appt by 1 month

## 2012-02-13 MED ORDER — ESTRADIOL 1 MG PO TABS: 1.0000 mg | ORAL_TABLET | Freq: Every day | ORAL | Status: DC

## 2012-02-13 NOTE — Telephone Encounter (Signed)
rx sent to pharmacy by e-script  

## 2012-03-12 ENCOUNTER — Encounter: Payer: Medicare Other | Admitting: Family Medicine

## 2012-03-29 ENCOUNTER — Encounter: Payer: Self-pay | Admitting: Family Medicine

## 2012-03-29 ENCOUNTER — Ambulatory Visit (INDEPENDENT_AMBULATORY_CARE_PROVIDER_SITE_OTHER): Payer: Medicare Other | Admitting: Family Medicine

## 2012-03-29 ENCOUNTER — Encounter: Payer: Self-pay | Admitting: *Deleted

## 2012-03-29 VITALS — BP 118/82 | HR 68 | Temp 98.5°F | Ht 63.5 in | Wt 154.4 lb

## 2012-03-29 DIAGNOSIS — I10 Essential (primary) hypertension: Secondary | ICD-10-CM

## 2012-03-29 DIAGNOSIS — Z0000 Encounter for general adult medical examination without abnormal findings: Secondary | ICD-10-CM | POA: Insufficient documentation

## 2012-03-29 DIAGNOSIS — D649 Anemia, unspecified: Secondary | ICD-10-CM

## 2012-03-29 LAB — LIPID PANEL
Cholesterol: 209 mg/dL — ABNORMAL HIGH (ref 0–200)
HDL: 57.3 mg/dL (ref >39.00)
Total CHOL/HDL Ratio: 4
Triglycerides: 157 mg/dL — ABNORMAL HIGH (ref 0.0–149.0)
VLDL: 31.4 mg/dL (ref 0.0–40.0)

## 2012-03-29 LAB — BASIC METABOLIC PANEL
BUN: 24 mg/dL — ABNORMAL HIGH (ref 6–23)
CO2: 29 mEq/L (ref 19–32)
Calcium: 9 mg/dL (ref 8.4–10.5)
Chloride: 104 mEq/L (ref 96–112)
Creatinine, Ser: 0.7 mg/dL (ref 0.4–1.2)
GFR: 86.09 mL/min (ref >60.00)
Glucose, Bld: 78 mg/dL (ref 70–99)
Potassium: 3.7 mEq/L (ref 3.5–5.1)
Sodium: 139 mEq/L (ref 135–145)

## 2012-03-29 LAB — LDL CHOLESTEROL, DIRECT: Direct LDL: 133.8 mg/dL

## 2012-03-29 LAB — CBC WITH DIFFERENTIAL/PLATELET
Basophils Absolute: 0 10*3/uL (ref 0.0–0.1)
Basophils Relative: 0.4 % (ref 0.0–3.0)
Eosinophils Absolute: 0.3 10*3/uL (ref 0.0–0.7)
Eosinophils Relative: 2.7 % (ref 0.0–5.0)
HCT: 41.8 % (ref 36.0–46.0)
Hemoglobin: 13.4 g/dL (ref 12.0–15.0)
Lymphocytes Relative: 23.2 % (ref 12.0–46.0)
Lymphs Abs: 2.5 10*3/uL (ref 0.7–4.0)
MCHC: 32.2 g/dL (ref 30.0–36.0)
MCV: 90.8 fl (ref 78.0–100.0)
Monocytes Absolute: 0.9 10*3/uL (ref 0.1–1.0)
Monocytes Relative: 8.1 % (ref 3.0–12.0)
Neutro Abs: 7.1 10*3/uL (ref 1.4–7.7)
Neutrophils Relative %: 65.6 % (ref 43.0–77.0)
Platelets: 344 10*3/uL (ref 150.0–400.0)
RBC: 4.6 Mil/uL (ref 3.87–5.11)
RDW: 14 % (ref 11.5–14.6)
WBC: 10.9 10*3/uL — ABNORMAL HIGH (ref 4.5–10.5)

## 2012-03-29 LAB — HEPATIC FUNCTION PANEL
ALT: 14 U/L (ref 0–35)
AST: 15 U/L (ref 0–37)
Albumin: 3.9 g/dL (ref 3.5–5.2)
Alkaline Phosphatase: 70 U/L (ref 39–117)
Bilirubin, Direct: 0 mg/dL (ref 0.0–0.3)
Total Bilirubin: 0.2 mg/dL — ABNORMAL LOW (ref 0.3–1.2)
Total Protein: 6.7 g/dL (ref 6.0–8.3)

## 2012-03-29 LAB — TSH: TSH: 1.97 u[IU]/mL (ref 0.35–5.50)

## 2012-03-29 NOTE — Progress Notes (Signed)
  Subjective:    Patient ID: Melissa Jordan, female    DOB: March 25, 1946, 66 y.o.   MRN: [id number]  HPI Here today for CPE.  Risk Factors: HTN- chronic problem, on Metoprolol w/out difficulty.  Excellent BP control.  Pt reports it is more for intermittent tachycardia than BP control.  Had complete cards w/u.  No CP, SOB, HAs, visual changes, edema. Anemia- pt reports this was post-op after back surgery.  Not currently on iron.  Denies fatigue, cold intolerance. Postmenopausal syndrome- pt is s/p TAH-BSO, on estrace.  No need for paps, UTD on mammo. Physical Activity: regular exercise, walking 2 miles/day Fall Risk: low risk, steady on feet Depression: no current sxs Hearing: normal to conversational tones and whispered voice at 6 ft ADL's: independent Cognitive: normal linear thought process, memory and attention intact Home Safety: safe at home, lives w/ husband Height, Weight, BMI, Visual Acuity: see vitals, vision corrected to 20/20 w/ glasses Counseling: UTD on colonoscopy, pap, mammo, DEXA Labs Ordered: See A&P Care Plan: See A&P    Review of Systems Patient reports no vision/ hearing changes, adenopathy,fever, weight change,  persistant/recurrent hoarseness , swallowing issues, chest pain, palpitations, edema, persistant/recurrent cough, hemoptysis, dyspnea (rest/exertional/paroxysmal nocturnal), gastrointestinal bleeding (melena, rectal bleeding), abdominal pain, significant heartburn, bowel changes, GU symptoms (dysuria, hematuria, incontinence), Gyn symptoms (abnormal  bleeding, pain),  syncope, focal weakness, memory loss, numbness & tingling, skin/hair/nail changes, abnormal bruising or bleeding, anxiety, or depression.     Objective:   Physical Exam  General Appearance:    Alert, cooperative, no distress, appears stated age  Head:    Normocephalic, without obvious abnormality, atraumatic  Eyes:    PERRL, conjunctiva/corneas clear, EOM's intact, fundi    benign, both eyes    Ears:    Normal TM's and external ear canals, both ears  Nose:   Nares normal, septum midline, mucosa normal, no drainage    or sinus tenderness  Throat:   Lips, mucosa, and tongue normal; teeth and gums normal  Neck:   Supple, symmetrical, trachea midline, no adenopathy;    Thyroid: no enlargement/tenderness/nodules  Back:     Symmetric, no curvature, ROM normal, no CVA tenderness  Lungs:     Clear to auscultation bilaterally, respirations unlabored  Chest Wall:    No tenderness or deformity   Heart:    Regular rate and rhythm, S1 and S2 normal, no murmur, rub   or gallop  Breast Exam:    No tenderness, masses, or nipple abnormality  Abdomen:     Soft, non-tender, bowel sounds active all four quadrants,    no masses, no organomegaly  Genitalia:    deferred  Rectal:    Extremities:   Extremities normal, atraumatic, no cyanosis or edema  Pulses:   2+ and symmetric all extremities  Skin:   Skin color, texture, turgor normal, no rashes or lesions  Lymph nodes:   Cervical, supraclavicular, and axillary nodes normal  Neurologic:   CNII-XII intact, normal strength, sensation and reflexes    throughout          Assessment & Plan:  .

## 2012-03-29 NOTE — Patient Instructions (Addendum)
Follow up in 1 year or as needed We'll notify you of your lab results and make any changes as needed Keep up the good work!!  You look great!! Call with any questions or concerns Welcome!  We're glad to have you!!!

## 2012-03-30 ENCOUNTER — Other Ambulatory Visit: Payer: Self-pay

## 2012-03-30 ENCOUNTER — Other Ambulatory Visit: Payer: Self-pay | Admitting: Family Medicine

## 2012-03-30 MED ORDER — METOPROLOL TARTRATE 50 MG PO TABS: 50.0000 mg | ORAL_TABLET | Freq: Two times a day (BID) | ORAL | Status: DC

## 2012-03-30 MED ORDER — ESTRADIOL 1 MG PO TABS: 1.0000 mg | ORAL_TABLET | Freq: Every day | ORAL | Status: DC

## 2012-03-30 NOTE — Telephone Encounter (Signed)
Last OV 03/29/12. Last filled 01/29/11 #180  x3 by Harvie Heck., MD not by you. Need ok plz advise      MW

## 2012-03-30 NOTE — Telephone Encounter (Signed)
rx sent to pharmacy by e-script  

## 2012-03-30 NOTE — Telephone Encounter (Signed)
refill ESTRACE 1 MG Take 1 tablet (1 mg total) by mouth daily. #30 last fill 9.3.13 last ov 10.7.13 V70

## 2012-04-06 NOTE — Assessment & Plan Note (Signed)
Chronic problem.  Currently asymptomatic.  Check labs.  tx prn.

## 2012-04-06 NOTE — Assessment & Plan Note (Signed)
Chronic problem.  Well controlled.  Asymptomatic.  No changes. 

## 2012-04-06 NOTE — Assessment & Plan Note (Signed)
Pt's PE WNL.  UTD on health maintenance.  Check labs.  Anticipatory guidance provided.  

## 2012-05-14 ENCOUNTER — Other Ambulatory Visit: Payer: Self-pay | Admitting: Orthopedic Surgery

## 2012-05-28 ENCOUNTER — Other Ambulatory Visit: Payer: Self-pay | Admitting: Family Medicine

## 2012-05-28 DIAGNOSIS — N63 Unspecified lump in unspecified breast: Secondary | ICD-10-CM

## 2012-06-04 ENCOUNTER — Telehealth: Payer: Self-pay | Admitting: *Deleted

## 2012-06-04 NOTE — Telephone Encounter (Signed)
Prior Auth complete and faxed awaiting response.

## 2012-06-09 NOTE — Telephone Encounter (Signed)
Prior Auth approved through 06-04-13, approval letter scan to chart.

## 2012-06-11 ENCOUNTER — Ambulatory Visit
Admission: RE | Admit: 2012-06-11 | Discharge: 2012-06-11 | Disposition: A | Discharge status: Home / Self Care | Payer: Medicare Other | Source: Ambulatory Visit | Attending: Family Medicine | Admitting: Family Medicine

## 2012-06-11 DIAGNOSIS — N63 Unspecified lump in unspecified breast: Secondary | ICD-10-CM

## 2012-06-14 ENCOUNTER — Encounter (HOSPITAL_COMMUNITY): Payer: Self-pay | Admitting: Respiratory Therapy

## 2012-06-18 ENCOUNTER — Encounter (HOSPITAL_COMMUNITY): Payer: Self-pay

## 2012-06-18 ENCOUNTER — Encounter (HOSPITAL_COMMUNITY)
Admission: RE | Admit: 2012-06-18 | Discharge: 2012-06-18 | Disposition: A | Discharge status: Home / Self Care | Payer: Medicare Other | Source: Ambulatory Visit | Attending: Orthopedic Surgery | Admitting: Orthopedic Surgery

## 2012-06-18 ENCOUNTER — Other Ambulatory Visit (HOSPITAL_COMMUNITY): Payer: Medicare Other

## 2012-06-18 ENCOUNTER — Other Ambulatory Visit: Payer: Self-pay | Admitting: Orthopedic Surgery

## 2012-06-18 ENCOUNTER — Encounter (HOSPITAL_COMMUNITY): Admission: RE | Admit: 2012-06-18 | Payer: Medicare Other | Source: Ambulatory Visit

## 2012-06-18 HISTORY — DX: Urgency of urination: R39.15

## 2012-06-18 HISTORY — DX: Effusion, unspecified joint: M25.40

## 2012-06-18 HISTORY — DX: Adverse effect of unspecified anesthetic, initial encounter: T41.45XA

## 2012-06-18 HISTORY — DX: Pain in unspecified joint: M25.50

## 2012-06-18 HISTORY — DX: Other complications of anesthesia, initial encounter: T88.59XA

## 2012-06-18 HISTORY — DX: Personal history of other diseases of the respiratory system: Z87.09

## 2012-06-18 HISTORY — DX: Spontaneous ecchymoses: R23.3

## 2012-06-18 HISTORY — DX: Pneumonia, unspecified organism: J18.9

## 2012-06-18 HISTORY — DX: Other skin changes: R23.8

## 2012-06-18 HISTORY — DX: Other specified disorders of bone density and structure, unspecified site: M85.80

## 2012-06-18 HISTORY — DX: Other seasonal allergic rhinitis: J30.2

## 2012-06-18 LAB — URINALYSIS, ROUTINE W REFLEX MICROSCOPIC
Bilirubin Urine: NEGATIVE
Glucose, UA: NEGATIVE mg/dL
Hgb urine dipstick: NEGATIVE
Ketones, ur: NEGATIVE mg/dL
Leukocytes, UA: NEGATIVE
Nitrite: NEGATIVE
Protein, ur: NEGATIVE mg/dL
Specific Gravity, Urine: 1.02 (ref 1.005–1.030)
Urobilinogen, UA: 0.2 mg/dL (ref 0.0–1.0)
pH: 7.5 (ref 5.0–8.0)

## 2012-06-18 LAB — CBC WITH DIFFERENTIAL/PLATELET
Basophils Absolute: 0 10*3/uL (ref 0.0–0.1)
Basophils Relative: 0 % (ref 0–1)
Eosinophils Absolute: 0.2 10*3/uL (ref 0.0–0.7)
Eosinophils Relative: 2 % (ref 0–5)
HCT: 42.3 % (ref 36.0–46.0)
Hemoglobin: 13.8 g/dL (ref 12.0–15.0)
Lymphocytes Relative: 20 % (ref 12–46)
Lymphs Abs: 2.2 10*3/uL (ref 0.7–4.0)
MCH: 29.2 pg (ref 26.0–34.0)
MCHC: 32.6 g/dL (ref 30.0–36.0)
MCV: 89.4 fL (ref 78.0–100.0)
Monocytes Absolute: 0.7 10*3/uL (ref 0.1–1.0)
Monocytes Relative: 6 % (ref 3–12)
Neutro Abs: 7.8 10*3/uL — ABNORMAL HIGH (ref 1.7–7.7)
Neutrophils Relative %: 71 % (ref 43–77)
Platelets: 282 10*3/uL (ref 150–400)
RBC: 4.73 MIL/uL (ref 3.87–5.11)
RDW: 13 % (ref 11.5–15.5)
WBC: 11 10*3/uL — ABNORMAL HIGH (ref 4.0–10.5)

## 2012-06-18 LAB — SURGICAL PCR SCREEN
MRSA, PCR: NEGATIVE
Staphylococcus aureus: NEGATIVE

## 2012-06-18 LAB — COMPREHENSIVE METABOLIC PANEL
ALT: 12 U/L (ref 0–35)
AST: 16 U/L (ref 0–37)
Albumin: 3.6 g/dL (ref 3.5–5.2)
Alkaline Phosphatase: 86 U/L (ref 39–117)
BUN: 15 mg/dL (ref 6–23)
CO2: 28 mEq/L (ref 19–32)
Calcium: 8.9 mg/dL (ref 8.4–10.5)
Chloride: 103 mEq/L (ref 96–112)
Creatinine, Ser: 0.66 mg/dL (ref 0.50–1.10)
GFR calc Af Amer: >90 mL/min (ref >90)
GFR calc non Af Amer: >90 mL/min (ref >90)
Glucose, Bld: 89 mg/dL (ref 70–99)
Potassium: 3.8 mEq/L (ref 3.5–5.1)
Sodium: 140 mEq/L (ref 135–145)
Total Bilirubin: 0.3 mg/dL (ref 0.3–1.2)
Total Protein: 6.6 g/dL (ref 6.0–8.3)

## 2012-06-18 LAB — TYPE AND SCREEN
ABO/RH(D): O POS
Antibody Screen: NEGATIVE

## 2012-06-18 LAB — APTT: aPTT: 37 seconds (ref 24–37)

## 2012-06-18 LAB — PROTIME-INR
INR: 1.08 (ref 0.00–1.49)
Prothrombin Time: 13.9 seconds (ref 11.6–15.2)

## 2012-06-18 MED ORDER — CEFAZOLIN SODIUM-DEXTROSE 2-3 GM-% IV SOLR: 2.0000 g | INTRAVENOUS | Status: DC

## 2012-06-18 MED ORDER — CHLORHEXIDINE GLUCONATE 4 % EX LIQD: 60.0000 mL | Freq: Once | CUTANEOUS | Status: DC

## 2012-06-18 NOTE — Progress Notes (Signed)
Saw a cardiologist 30+yrs ago d/t heart racing  Echo and stress test done  > 25yrs ago Denies heart cath  Dr.Katherine Tabori with [facility] off of [address]  EKG and CXR to be requested from [facility]

## 2012-06-18 NOTE — Progress Notes (Signed)
Called and left message for Melissa Jordan that laterality in computer is different than what pt says-to clarify and allergy to PCN-? To still use ancef;awaiting return call

## 2012-06-18 NOTE — Progress Notes (Signed)
Blood sugar may be elevated today d/t cortisone injection received today

## 2012-06-18 NOTE — Pre-Procedure Instructions (Signed)
20 CHERYLE DARK  06/18/2012   Your procedure is scheduled on:  Mon, Jan 6 @ 10:00 AM  Report to [facility] at 8:00 AM.  Call this number if you have problems the morning of surgery: [phone]   Remember:   Do not eat food:After Midnight.    Take these medicines the morning of surgery with A SIP OF WATER: Pain Pill(if needed) and CLaritin(Loratadine)   Do not wear jewelry, make-up or nail polish.  Do not wear lotions, powders, or perfumes. You may wear deodorant.  Do not shave 48 hours prior to surgery.   Do not bring valuables to the hospital.  Contacts, dentures or bridgework may not be worn into surgery.  Leave suitcase in the car. After surgery it may be brought to your room.  For patients admitted to the hospital, checkout time is 11:00 AM the day of discharge.   Patients discharged the day of surgery will not be allowed to drive home.  Special Instructions: Shower using CHG 2 nights before surgery and the night before surgery.  If you shower the day of surgery use CHG.  Use special wash - you have one bottle of CHG for all showers.  You should use approximately 1/3 of the bottle for each shower.   Please read over the following fact sheets that you were given: Pain Booklet, Coughing and Deep Breathing, Blood Transfusion Information, Total Joint Packet, MRSA Information and Surgical Site Infection Prevention

## 2012-06-20 LAB — URINE CULTURE: Colony Count: 75000

## 2012-06-25 ENCOUNTER — Telehealth: Payer: Self-pay | Admitting: Family Medicine

## 2012-06-25 DIAGNOSIS — IMO0001 Reserved for inherently not codable concepts without codable children: Secondary | ICD-10-CM

## 2012-06-25 MED ORDER — ESTRADIOL 1 MG PO TABS: 1.0000 mg | ORAL_TABLET | Freq: Every day | ORAL | Status: DC

## 2012-06-25 NOTE — Telephone Encounter (Signed)
Refill for Estradiol sent to pharmacy.

## 2012-06-25 NOTE — Telephone Encounter (Signed)
Refill: Estradiol 1 mg tab. Take one tablet by mouth every day. Qty 30. Last fill 03-30-12

## 2012-06-27 MED ORDER — VANCOMYCIN HCL IN DEXTROSE 1-5 GM/200ML-% IV SOLN
1000.0000 mg | Freq: Once | INTRAVENOUS | Status: AC
  Administered 2012-06-28: 1000 mg via INTRAVENOUS
  Filled 2012-06-27: qty 200

## 2012-06-28 ENCOUNTER — Inpatient Hospital Stay (HOSPITAL_COMMUNITY): Payer: Medicare Other | Admitting: Anesthesiology

## 2012-06-28 ENCOUNTER — Encounter (HOSPITAL_COMMUNITY): Payer: Self-pay | Admitting: Anesthesiology

## 2012-06-28 ENCOUNTER — Inpatient Hospital Stay (HOSPITAL_COMMUNITY)
Admission: RE | Admit: 2012-06-28 | Discharge: 2012-06-30 | DRG: 470 | Disposition: A | Discharge status: Home / Self Care | Payer: Medicare Other | Source: Ambulatory Visit | Attending: Orthopedic Surgery | Admitting: Orthopedic Surgery

## 2012-06-28 ENCOUNTER — Encounter (HOSPITAL_COMMUNITY): Payer: Self-pay | Admitting: *Deleted

## 2012-06-28 ENCOUNTER — Encounter (HOSPITAL_COMMUNITY)
Admission: RE | Disposition: A | Discharge status: Home / Self Care | Payer: Self-pay | Source: Ambulatory Visit | Attending: Orthopedic Surgery

## 2012-06-28 ENCOUNTER — Inpatient Hospital Stay (HOSPITAL_COMMUNITY): Payer: Medicare Other

## 2012-06-28 DIAGNOSIS — M169 Osteoarthritis of hip, unspecified: Principal | ICD-10-CM | POA: Diagnosis present

## 2012-06-28 DIAGNOSIS — D62 Acute posthemorrhagic anemia: Secondary | ICD-10-CM | POA: Diagnosis not present

## 2012-06-28 DIAGNOSIS — M549 Dorsalgia, unspecified: Secondary | ICD-10-CM | POA: Diagnosis present

## 2012-06-28 DIAGNOSIS — M1610 Unilateral primary osteoarthritis, unspecified hip: Principal | ICD-10-CM | POA: Diagnosis present

## 2012-06-28 DIAGNOSIS — E039 Hypothyroidism, unspecified: Secondary | ICD-10-CM | POA: Diagnosis present

## 2012-06-28 DIAGNOSIS — I10 Essential (primary) hypertension: Secondary | ICD-10-CM | POA: Diagnosis present

## 2012-06-28 DIAGNOSIS — IMO0001 Reserved for inherently not codable concepts without codable children: Secondary | ICD-10-CM

## 2012-06-28 DIAGNOSIS — K219 Gastro-esophageal reflux disease without esophagitis: Secondary | ICD-10-CM | POA: Diagnosis present

## 2012-06-28 HISTORY — DX: Low back pain: M54.5

## 2012-06-28 HISTORY — DX: Unspecified osteoarthritis, unspecified site: M19.90

## 2012-06-28 HISTORY — DX: Unspecified asthma, uncomplicated: J45.909

## 2012-06-28 HISTORY — PX: PARTIAL KNEE ARTHROPLASTY: SHX2174

## 2012-06-28 HISTORY — PX: REPLACEMENT UNICONDYLAR JOINT KNEE: SUR1227

## 2012-06-28 HISTORY — DX: Other chronic pain: G89.29

## 2012-06-28 HISTORY — DX: Low back pain, unspecified: M54.50

## 2012-06-28 HISTORY — DX: Other specified postprocedural states: R11.2

## 2012-06-28 HISTORY — DX: Other specified postprocedural states: Z98.890

## 2012-06-28 LAB — CREATININE, SERUM
Creatinine, Ser: 0.55 mg/dL (ref 0.50–1.10)
GFR calc Af Amer: >90 mL/min (ref >90)
GFR calc non Af Amer: >90 mL/min (ref >90)

## 2012-06-28 LAB — CBC
HCT: 39.4 % (ref 36.0–46.0)
Hemoglobin: 12.9 g/dL (ref 12.0–15.0)
MCH: 29.4 pg (ref 26.0–34.0)
MCHC: 32.7 g/dL (ref 30.0–36.0)
MCV: 89.7 fL (ref 78.0–100.0)
Platelets: 289 10*3/uL (ref 150–400)
RBC: 4.39 MIL/uL (ref 3.87–5.11)
RDW: 13.1 % (ref 11.5–15.5)
WBC: 18.9 10*3/uL — ABNORMAL HIGH (ref 4.0–10.5)

## 2012-06-28 SURGERY — ARTHROPLASTY, KNEE, UNICOMPARTMENTAL
Anesthesia: Regional | Site: Knee | Laterality: Left | Wound class: Clean

## 2012-06-28 MED ORDER — FENTANYL CITRATE 0.05 MG/ML IJ SOLN
INTRAMUSCULAR | Status: DC | PRN
  Administered 2012-06-28: 100 ug via INTRAVENOUS
  Administered 2012-06-28: 50 ug via INTRAVENOUS
  Administered 2012-06-28: 25 ug via INTRAVENOUS

## 2012-06-28 MED ORDER — CELECOXIB 200 MG PO CAPS
200.0000 mg | ORAL_CAPSULE | Freq: Two times a day (BID) | ORAL | Status: DC
  Administered 2012-06-29 – 2012-06-30 (×3): 200 mg via ORAL
  Filled 2012-06-28 (×6): qty 1

## 2012-06-28 MED ORDER — FENTANYL CITRATE 0.05 MG/ML IJ SOLN: 50.0000 ug | INTRAMUSCULAR | Status: DC | PRN

## 2012-06-28 MED ORDER — SODIUM CHLORIDE 0.9 % IR SOLN
Status: DC | PRN
  Administered 2012-06-28: 3000 mL

## 2012-06-28 MED ORDER — SCOPOLAMINE 1 MG/3DAYS TD PT72
MEDICATED_PATCH | TRANSDERMAL | Status: AC
  Filled 2012-06-28: qty 1

## 2012-06-28 MED ORDER — HYDROMORPHONE HCL PF 1 MG/ML IJ SOLN
0.2500 mg | INTRAMUSCULAR | Status: DC | PRN
  Administered 2012-06-28 (×4): 0.5 mg via INTRAVENOUS

## 2012-06-28 MED ORDER — BUPIVACAINE-EPINEPHRINE PF 0.5-1:200000 % IJ SOLN
INTRAMUSCULAR | Status: DC | PRN
  Administered 2012-06-28: 30 mL

## 2012-06-28 MED ORDER — MIDAZOLAM HCL 2 MG/2ML IJ SOLN: 1.0000 mg | INTRAMUSCULAR | Status: DC | PRN

## 2012-06-28 MED ORDER — HYDROMORPHONE HCL PF 1 MG/ML IJ SOLN
INTRAMUSCULAR | Status: AC
  Filled 2012-06-28: qty 1

## 2012-06-28 MED ORDER — OXYCODONE HCL 5 MG PO TABS: 5.0000 mg | ORAL_TABLET | Freq: Once | ORAL | Status: DC | PRN

## 2012-06-28 MED ORDER — DOCUSATE SODIUM 100 MG PO CAPS
100.0000 mg | ORAL_CAPSULE | Freq: Two times a day (BID) | ORAL | Status: DC
  Administered 2012-06-29 – 2012-06-30 (×3): 100 mg via ORAL
  Filled 2012-06-28 (×5): qty 1

## 2012-06-28 MED ORDER — PROPOFOL 10 MG/ML IV BOLUS
INTRAVENOUS | Status: DC | PRN
  Administered 2012-06-28: 200 mg via INTRAVENOUS

## 2012-06-28 MED ORDER — DIPHENHYDRAMINE HCL 12.5 MG/5ML PO ELIX
12.5000 mg | ORAL_SOLUTION | ORAL | Status: DC | PRN
  Administered 2012-06-28 – 2012-06-30 (×5): 25 mg via ORAL
  Filled 2012-06-28 (×4): qty 10

## 2012-06-28 MED ORDER — ONDANSETRON HCL 4 MG PO TABS
4.0000 mg | ORAL_TABLET | Freq: Four times a day (QID) | ORAL | Status: DC | PRN
  Administered 2012-06-29: 4 mg via ORAL
  Filled 2012-06-28: qty 1

## 2012-06-28 MED ORDER — ACETAMINOPHEN 325 MG PO TABS: 650.0000 mg | ORAL_TABLET | Freq: Four times a day (QID) | ORAL | Status: DC | PRN

## 2012-06-28 MED ORDER — MIDAZOLAM HCL 5 MG/5ML IJ SOLN
INTRAMUSCULAR | Status: DC | PRN
  Administered 2012-06-28 (×2): 1 mg via INTRAVENOUS

## 2012-06-28 MED ORDER — PHENOL 1.4 % MT LIQD: 1.0000 | OROMUCOSAL | Status: DC | PRN

## 2012-06-28 MED ORDER — ALUM & MAG HYDROXIDE-SIMETH 200-200-20 MG/5ML PO SUSP: 30.0000 mL | ORAL | Status: DC | PRN

## 2012-06-28 MED ORDER — ZOLPIDEM TARTRATE 5 MG PO TABS: 5.0000 mg | ORAL_TABLET | Freq: Every evening | ORAL | Status: DC | PRN

## 2012-06-28 MED ORDER — ACETAMINOPHEN 10 MG/ML IV SOLN
1000.0000 mg | Freq: Four times a day (QID) | INTRAVENOUS | Status: DC
  Administered 2012-06-28: 1000 mg via INTRAVENOUS

## 2012-06-28 MED ORDER — MIDAZOLAM HCL 2 MG/2ML IJ SOLN: 0.5000 mg | Freq: Once | INTRAMUSCULAR | Status: DC | PRN

## 2012-06-28 MED ORDER — DEXAMETHASONE SODIUM PHOSPHATE 4 MG/ML IJ SOLN
INTRAMUSCULAR | Status: DC | PRN
  Administered 2012-06-28: 4 mg via INTRAVENOUS

## 2012-06-28 MED ORDER — SCOPOLAMINE 1 MG/3DAYS TD PT72
1.0000 | MEDICATED_PATCH | TRANSDERMAL | Status: DC
  Administered 2012-06-28: 1 via TRANSDERMAL

## 2012-06-28 MED ORDER — METOCLOPRAMIDE HCL 5 MG/ML IJ SOLN
5.0000 mg | Freq: Three times a day (TID) | INTRAMUSCULAR | Status: DC | PRN
  Administered 2012-06-28 – 2012-06-29 (×2): 10 mg via INTRAVENOUS
  Filled 2012-06-28 (×2): qty 2

## 2012-06-28 MED ORDER — METOCLOPRAMIDE HCL 10 MG PO TABS
5.0000 mg | ORAL_TABLET | Freq: Three times a day (TID) | ORAL | Status: DC | PRN
  Administered 2012-06-30: 10 mg via ORAL
  Filled 2012-06-28: qty 1

## 2012-06-28 MED ORDER — PROMETHAZINE HCL 25 MG/ML IJ SOLN
6.2500 mg | INTRAMUSCULAR | Status: DC | PRN
  Administered 2012-06-28: 12.5 mg via INTRAVENOUS

## 2012-06-28 MED ORDER — SENNOSIDES-DOCUSATE SODIUM 8.6-50 MG PO TABS: 1.0000 | ORAL_TABLET | Freq: Every evening | ORAL | Status: DC | PRN

## 2012-06-28 MED ORDER — OXYCODONE HCL 5 MG PO TABS
5.0000 mg | ORAL_TABLET | ORAL | Status: DC | PRN
  Administered 2012-06-29 – 2012-06-30 (×5): 10 mg via ORAL
  Filled 2012-06-28 (×5): qty 2

## 2012-06-28 MED ORDER — METOPROLOL TARTRATE 50 MG PO TABS
50.0000 mg | ORAL_TABLET | Freq: Two times a day (BID) | ORAL | Status: DC
  Administered 2012-06-29 – 2012-06-30 (×3): 50 mg via ORAL
  Filled 2012-06-28 (×5): qty 1

## 2012-06-28 MED ORDER — LORATADINE 10 MG PO TABS
10.0000 mg | ORAL_TABLET | Freq: Every day | ORAL | Status: DC
  Filled 2012-06-28 (×2): qty 1

## 2012-06-28 MED ORDER — SODIUM CHLORIDE 0.9 % IV SOLN
INTRAVENOUS | Status: DC
Start: 2012-06-28 — End: 2012-06-28

## 2012-06-28 MED ORDER — ONDANSETRON HCL 4 MG/2ML IJ SOLN
INTRAMUSCULAR | Status: DC | PRN
  Administered 2012-06-28: 4 mg via INTRAVENOUS

## 2012-06-28 MED ORDER — ONDANSETRON HCL 4 MG/2ML IJ SOLN
4.0000 mg | Freq: Four times a day (QID) | INTRAMUSCULAR | Status: DC | PRN
  Administered 2012-06-28 – 2012-06-30 (×4): 4 mg via INTRAVENOUS
  Filled 2012-06-28 (×4): qty 2

## 2012-06-28 MED ORDER — FLEET ENEMA 7-19 GM/118ML RE ENEM: 1.0000 | ENEMA | Freq: Once | RECTAL | Status: AC | PRN

## 2012-06-28 MED ORDER — OXYCODONE HCL ER 10 MG PO T12A
10.0000 mg | EXTENDED_RELEASE_TABLET | Freq: Two times a day (BID) | ORAL | Status: DC
  Administered 2012-06-29 – 2012-06-30 (×3): 10 mg via ORAL
  Filled 2012-06-28 (×5): qty 1

## 2012-06-28 MED ORDER — ENOXAPARIN SODIUM 30 MG/0.3ML ~~LOC~~ SOLN
30.0000 mg | Freq: Two times a day (BID) | SUBCUTANEOUS | Status: DC
  Administered 2012-06-29 – 2012-06-30 (×3): 30 mg via SUBCUTANEOUS
  Filled 2012-06-28 (×5): qty 0.3

## 2012-06-28 MED ORDER — MENTHOL 3 MG MT LOZG
1.0000 | LOZENGE | OROMUCOSAL | Status: DC | PRN
  Administered 2012-06-29: 3 mg via ORAL
  Filled 2012-06-28: qty 9

## 2012-06-28 MED ORDER — LIDOCAINE HCL (CARDIAC) 20 MG/ML IV SOLN
INTRAVENOUS | Status: DC | PRN
  Administered 2012-06-28: 30 mg via INTRAVENOUS

## 2012-06-28 MED ORDER — PROMETHAZINE HCL 25 MG/ML IJ SOLN
INTRAMUSCULAR | Status: AC
  Filled 2012-06-28: qty 1

## 2012-06-28 MED ORDER — LACTATED RINGERS IV SOLN
INTRAVENOUS | Status: DC | PRN
  Administered 2012-06-28: 09:00:00 via INTRAVENOUS

## 2012-06-28 MED ORDER — PNEUMOCOCCAL VAC POLYVALENT 25 MCG/0.5ML IJ INJ: 0.5000 mL | INJECTION | INTRAMUSCULAR | Status: DC

## 2012-06-28 MED ORDER — BISACODYL 5 MG PO TBEC: 5.0000 mg | DELAYED_RELEASE_TABLET | Freq: Every day | ORAL | Status: DC | PRN

## 2012-06-28 MED ORDER — ARTIFICIAL TEARS OP OINT
TOPICAL_OINTMENT | OPHTHALMIC | Status: DC | PRN
  Administered 2012-06-28: 1 via OPHTHALMIC

## 2012-06-28 MED ORDER — METHOCARBAMOL 100 MG/ML IJ SOLN
500.0000 mg | Freq: Four times a day (QID) | INTRAVENOUS | Status: DC | PRN
  Administered 2012-06-28: 500 mg via INTRAVENOUS
  Filled 2012-06-28: qty 5

## 2012-06-28 MED ORDER — MEPERIDINE HCL 25 MG/ML IJ SOLN: 6.2500 mg | INTRAMUSCULAR | Status: DC | PRN

## 2012-06-28 MED ORDER — METHOCARBAMOL 500 MG PO TABS
500.0000 mg | ORAL_TABLET | Freq: Four times a day (QID) | ORAL | Status: DC | PRN
  Administered 2012-06-30: 500 mg via ORAL
  Filled 2012-06-28 (×2): qty 1

## 2012-06-28 MED ORDER — ESTRADIOL 1 MG PO TABS
1.0000 mg | ORAL_TABLET | Freq: Every day | ORAL | Status: DC
  Administered 2012-06-29 – 2012-06-30 (×2): 1 mg via ORAL
  Filled 2012-06-28 (×3): qty 1

## 2012-06-28 MED ORDER — SODIUM CHLORIDE 0.9 % IV SOLN
INTRAVENOUS | Status: DC
  Administered 2012-06-28 – 2012-06-29 (×3): via INTRAVENOUS

## 2012-06-28 MED ORDER — HYDROMORPHONE HCL PF 1 MG/ML IJ SOLN
1.0000 mg | INTRAMUSCULAR | Status: DC | PRN
  Administered 2012-06-28 – 2012-06-30 (×9): 1 mg via INTRAVENOUS
  Filled 2012-06-28 (×10): qty 1

## 2012-06-28 MED ORDER — BUPIVACAINE HCL (PF) 0.5 % IJ SOLN
INTRAMUSCULAR | Status: AC
  Filled 2012-06-28: qty 30

## 2012-06-28 MED ORDER — VANCOMYCIN HCL IN DEXTROSE 1-5 GM/200ML-% IV SOLN
1000.0000 mg | Freq: Two times a day (BID) | INTRAVENOUS | Status: AC
  Administered 2012-06-28: 1000 mg via INTRAVENOUS
  Filled 2012-06-28: qty 200

## 2012-06-28 MED ORDER — ACETAMINOPHEN 10 MG/ML IV SOLN
INTRAVENOUS | Status: AC
  Filled 2012-06-28: qty 100

## 2012-06-28 MED ORDER — BUPIVACAINE HCL 0.5 % IJ SOLN
INTRAMUSCULAR | Status: DC | PRN
  Administered 2012-06-28: 30 mL

## 2012-06-28 MED ORDER — ACETAMINOPHEN 650 MG RE SUPP: 650.0000 mg | Freq: Four times a day (QID) | RECTAL | Status: DC | PRN

## 2012-06-28 MED ORDER — OXYCODONE HCL 5 MG/5ML PO SOLN: 5.0000 mg | Freq: Once | ORAL | Status: DC | PRN

## 2012-06-28 SURGICAL SUPPLY — 61 items
BANDAGE ESMARK 6X9 LF (GAUZE/BANDAGES/DRESSINGS) ×1 IMPLANT
BLADE SAW RECIP 87.9 MT (BLADE) ×2 IMPLANT
BLADE SAW SGTL 13X75X1.27 (BLADE) ×2 IMPLANT
BLADE SAW SGTL 83.5X18.5 (BLADE) ×2 IMPLANT
BLADE SURG 10 STRL SS (BLADE) ×1 IMPLANT
BNDG CMPR 9X6 STRL LF SNTH (GAUZE/BANDAGES/DRESSINGS) ×1
BNDG CMPR MED 10X6 ELC LF (GAUZE/BANDAGES/DRESSINGS) ×1
BNDG ELASTIC 6X10 VLCR STRL LF (GAUZE/BANDAGES/DRESSINGS) ×2 IMPLANT
BNDG ESMARK 6X9 LF (GAUZE/BANDAGES/DRESSINGS) ×2
BOWL SMART MIX CTS (DISPOSABLE) ×2 IMPLANT
CEMENT BONE SIMPLEX SPEEDSET (Cement) ×2 IMPLANT
CLOTH BEACON ORANGE TIMEOUT ST (SAFETY) ×2 IMPLANT
COVER BACK TABLE 24X17X13 BIG (DRAPES) ×1 IMPLANT
COVER SURGICAL LIGHT HANDLE (MISCELLANEOUS) ×4 IMPLANT
CUFF TOURNIQUET SINGLE 34IN LL (TOURNIQUET CUFF) ×2 IMPLANT
DRAPE C-ARM 42X72 X-RAY (DRAPES) ×2 IMPLANT
DRAPE EXTREMITY T 121X128X90 (DRAPE) ×2 IMPLANT
DRAPE INCISE IOBAN 66X45 STRL (DRAPES) ×6 IMPLANT
DRAPE PROXIMA HALF (DRAPES) ×2 IMPLANT
DRAPE U-SHAPE 47X51 STRL (DRAPES) ×2 IMPLANT
DRSG ADAPTIC 3X8 NADH LF (GAUZE/BANDAGES/DRESSINGS) ×2 IMPLANT
DRSG PAD ABDOMINAL 8X10 ST (GAUZE/BANDAGES/DRESSINGS) ×2 IMPLANT
DURAPREP 26ML APPLICATOR (WOUND CARE) ×4 IMPLANT
ELECT REM PT RETURN 9FT ADLT (ELECTROSURGICAL) ×2
ELECTRODE REM PT RTRN 9FT ADLT (ELECTROSURGICAL) ×1 IMPLANT
EVACUATOR 1/8 PVC DRAIN (DRAIN) ×1 IMPLANT
FLUID NSS /IRRIG 3000 ML XXX (IV SOLUTION) ×2 IMPLANT
GLOVE BIOGEL M 7.0 STRL (GLOVE) ×2 IMPLANT
GLOVE BIOGEL PI IND STRL 7.5 (GLOVE) ×1 IMPLANT
GLOVE BIOGEL PI IND STRL 8.5 (GLOVE) ×2 IMPLANT
GLOVE BIOGEL PI INDICATOR 7.5 (GLOVE) ×1
GLOVE BIOGEL PI INDICATOR 8.5 (GLOVE) ×2
GLOVE SURG ORTHO 8.0 STRL STRW (GLOVE) ×4 IMPLANT
GOWN PREVENTION PLUS XLARGE (GOWN DISPOSABLE) ×4 IMPLANT
GOWN STRL NON-REIN LRG LVL3 (GOWN DISPOSABLE) ×4 IMPLANT
HANDPIECE INTERPULSE COAX TIP (DISPOSABLE) ×2
HOOD PEEL AWAY FACE SHEILD DIS (HOOD) ×6 IMPLANT
KIT BASIN OR (CUSTOM PROCEDURE TRAY) ×2 IMPLANT
KIT ROOM TURNOVER OR (KITS) ×2 IMPLANT
MANIFOLD NEPTUNE II (INSTRUMENTS) ×2 IMPLANT
NEEDLE 22X1 1/2 (OR ONLY) (NEEDLE) ×2 IMPLANT
NS IRRIG 1000ML POUR BTL (IV SOLUTION) ×2 IMPLANT
PACK TOTAL JOINT (CUSTOM PROCEDURE TRAY) ×2 IMPLANT
PAD ARMBOARD 7.5X6 YLW CONV (MISCELLANEOUS) ×4 IMPLANT
PADDING CAST COTTON 6X4 STRL (CAST SUPPLIES) ×2 IMPLANT
SCREW HEADED 33MM (Screw) ×6 IMPLANT
SET HNDPC FAN SPRY TIP SCT (DISPOSABLE) ×1 IMPLANT
SPONGE GAUZE 4X4 12PLY (GAUZE/BANDAGES/DRESSINGS) ×2 IMPLANT
STAPLER VISISTAT 35W (STAPLE) ×2 IMPLANT
SUCTION FRAZIER TIP 10 FR DISP (SUCTIONS) ×2 IMPLANT
SUT VIC AB 0 CT1 27 (SUTURE) ×4
SUT VIC AB 0 CT1 27XBRD ANBCTR (SUTURE) ×2 IMPLANT
SUT VIC AB 1 CT1 27 (SUTURE) ×4
SUT VIC AB 1 CT1 27XBRD ANBCTR (SUTURE) ×1 IMPLANT
SUT VIC AB 2-0 CT1 27 (SUTURE) ×2
SUT VIC AB 2-0 CT1 TAPERPNT 27 (SUTURE) ×1 IMPLANT
SYR CONTROL 10ML LL (SYRINGE) ×2 IMPLANT
TOWEL OR 17X24 6PK STRL BLUE (TOWEL DISPOSABLE) ×2 IMPLANT
TOWEL OR 17X26 10 PK STRL BLUE (TOWEL DISPOSABLE) ×2 IMPLANT
TRAY TIBIAL INSERTER TIP (Orthopedic Implant) ×1 IMPLANT
WATER STERILE IRR 1000ML POUR (IV SOLUTION) ×6 IMPLANT

## 2012-06-28 NOTE — H&P (Signed)
TOTAL KNEE ADMISSION H&P  Patient is being admitted for left total knee arthroplasty.  Subjective:  Chief Complaint:left knee pain.  HPI: Melissa Jordan, 67 y.o. female, has a history of pain and functional disability in the left knee due to arthritis and has failed non-surgical conservative treatments for greater than 12 weeks to include nsaids, steriod injections  Onset of symptoms was gradual, starting severalyears ago with a gradual course since that time. The patient noted no past surgery on the left knee(s).  Patient currently rates pain in the left knee(s) at 10 out of 10 with activity. Patient has night.  Patient has evidence of osteoarthritis by imaging studies There is no active infection.   Patient Active Problem List   Diagnosis Date Noted  . Routine general medical examination at a health care facility 03/29/2012  . POSTMENOPAUSAL SYNDROME 09/26/2009  . URINARY URGENCY 09/26/2009  . ANEMIA 01/09/2009  . BACK PAIN, CHRONIC 01/09/2009  . FATIGUE 01/09/2009  . LAMINECTOMY, LUMBAR, HX OF 08/09/2008  . HYPOTHYROIDISM NOS 03/03/2007  . HYPERTENSION, BENIGN 03/03/2007  . GERD 02/10/2007   Past Medical History  Diagnosis Date  . Allergy   . GERD (gastroesophageal reflux disease)   . Arrhythmia     takes Metoprolol daily  . Complication of anesthesia     extremely post op Nausea and Vomting;pt states B/P bottomes out after surgery  . History of bronchitis     last time in the 90's  . Pneumonia     last time 2-3 yrs ago  . Arthritis   . Joint pain   . Joint swelling   . Osteopenia   . Bruises easily   . Seasonal allergies     takes Claritin daily  . Urinary urgency     Past Surgical History  Procedure Date  . Tonsillectomy and adenoidectomy   . Lumbar laminectomy   . Spine surgery   . Appendectomy     as a child  . Abdominal hysterectomy 70's  . Cholecystectomy 80's  . Left shoulder surgery   . Right knee arthroscopy   . Back surgery     x 4  . Breast  biopsy 2 yrs ago  . Colonosocpy   . Esophagogastroduodenoscopy     Prescriptions prior to admission  Medication Sig Dispense Refill  . acetaminophen (TYLENOL) 500 MG tablet Take 1,000 mg by mouth 2 (two) times daily as needed. For pain      . aspirin 81 MG EC tablet Take 81 mg by mouth daily.        Marland Kitchen guaiFENesin (MUCINEX) 600 MG 12 hr tablet Take 1,200 mg by mouth 2 (two) times daily as needed. For congestion      . loratadine (CLARITIN) 10 MG tablet Take 10 mg by mouth daily.      . metoprolol (LOPRESSOR) 50 MG tablet Take 1 tablet (50 mg total) by mouth 2 (two) times daily.  180 tablet  3  . oxyCODONE-acetaminophen (PERCOCET/ROXICET) 5-325 MG per tablet Take 1 tablet by mouth every 6 (six) hours as needed. For pain      . estradiol (ESTRACE) 1 MG tablet Take 1 tablet (1 mg total) by mouth daily.  30 tablet  3   Allergies  Allergen Reactions  . Lyrica (Pregabalin)   . Meclizine   . Morphine Sulfate     REACTION: unspecified  . Penicillins Itching  . Propoxyphene-Acetaminophen     History  Substance Use Topics  . Smoking status: Never Smoker   .  Smokeless tobacco: Never Used  . Alcohol Use: No    Family History  Problem Relation Age of Onset  . Alcohol abuse      fhx  . Diabetes      fhx  . Hypertension      fhx  . Stroke      fhx  . Heart disease      fhx  . Asthma      fhx  . COPD Mother   . Heart disease Father      Review of Systems  Constitutional: Negative.   HENT: Negative.   Eyes: Negative.   Respiratory: Negative.   Cardiovascular: Negative.   Gastrointestinal: Negative.   Genitourinary: Negative.   Musculoskeletal: Negative.   Skin: Negative.   Neurological: Negative.   Endo/Heme/Allergies: Negative.   Psychiatric/Behavioral: Negative.     Objective:  Physical Exam  Constitutional: She is oriented to person, place, and time. She appears well-developed and well-nourished.  HENT:  Head: Normocephalic.  Eyes: Pupils are equal, round, and  reactive to light.  Neck: Normal range of motion. Neck supple.  Cardiovascular: Normal rate and regular rhythm.   Respiratory: Effort normal.  GI: Soft.  Musculoskeletal:       Left knee: She exhibits decreased range of motion and swelling.  Neurological: She is alert and oriented to person, place, and time.  Skin: Skin is warm and dry.  Psychiatric: She has a normal mood and affect.    Vital signs in last 24 hours:    Labs:   Estimated Body mass index is 26.92 kg/(m^2) as calculated from the following:   Height as of 03/29/12: 5' 3.5"(1.613 m).   Weight as of 03/29/12: 154 lb 6.4 oz(70.035 kg).   Imaging Review Plain radiographs demonstrate severe degenerative joint disease of the left knee(s). The overall alignment isvarus. The bone quality appears to be good for age and reported activity level.  Assessment/Plan:  End stage arthritis, left knee   The patient history, physical examination, clinical judgment of the provider and imaging studies are consistent with end stage degenerative joint disease of the left knee(s) and total knee arthroplasty is deemed medically necessary. The treatment options including medical management, injection therapy arthroscopy and arthroplasty were discussed at length. The risks and benefits of total knee arthroplasty were presented and reviewed. The risks due to aseptic loosening, infection, stiffness, patella tracking problems, thromboembolic complications and other imponderables were discussed. The patient acknowledged the explanation, agreed to proceed with the plan and consent was signed. Patient is being admitted for inpatient treatment for surgery, pain control, PT, OT, prophylactic antibiotics, VTE prophylaxis, progressive ambulation and ADL's and discharge planning. The patient is planning to be discharged home health physical therapy.

## 2012-06-28 NOTE — Anesthesia Procedure Notes (Addendum)
Anesthesia Regional Block:  Femoral nerve block  Pre-Anesthetic Checklist: ,, timeout performed, Correct Patient, Correct Site, Correct Laterality, Correct Procedure, Correct Position, site marked, Risks and benefits discussed,  Surgical consent,  Pre-op evaluation,  At surgeon's request and post-op pain management  Laterality: Left  Prep: chloraprep       Needles:  Injection technique: Single-shot  Needle Type: Stimulator Needle - 40      Needle Gauge: 22 and 22 G    Additional Needles:  Procedures: nerve stimulator Femoral nerve block  Nerve Stimulator or Paresthesia:  Response: patella twitch, 0.45 mA, 0.1 ms,   Additional Responses:   Narrative:  Start time: 06/28/2012 9:01 AM End time: 06/28/2012 9:06 AM Injection made incrementally with aspirations every 5 mL.  Performed by: Personally  Anesthesiologist: Sandford Craze, MD  Additional Notes: Pt identified in Holding room.  Monitors applied. Working IV access confirmed. Sterile prep L knee.  #22ga PNS to patella twitch at 0.64mA threshold.  30cc 0.5% Bupivacaine with 1:200k epi injected incrementally after negative test dose.  Patient asymptomatic, VSS, no heme aspirated, tolerated well.  Sandford Craze, MD    Femoral nerve block Procedure Name: LMA Insertion Date/Time: 06/28/2012 9:22 AM Performed by: Gayla Medicus Pre-anesthesia Checklist: Patient identified, Timeout performed, Emergency Drugs available, Suction available and Patient being monitored Patient Re-evaluated:Patient Re-evaluated prior to inductionOxygen Delivery Method: Circle system utilized Preoxygenation: Pre-oxygenation with 100% oxygen Intubation Type: IV induction LMA: LMA inserted LMA Size: 4.0 Number of attempts: 2 Placement Confirmation: positive ETCO2 and breath sounds checked- equal and bilateral Tube secured with: Tape Dental Injury: Teeth and Oropharynx as per pre-operative assessment  Comments: Unable to seat #4 LMA w/ gastric port, regular #4  LMA placed easily

## 2012-06-28 NOTE — Preoperative (Signed)
Beta Blockers   Reason not to administer Beta Blockers:Not Applicable 

## 2012-06-28 NOTE — Progress Notes (Signed)
Orthopedic Tech Progress Note Patient Details:  SARAGRACE SELKE Jun 14, 1946 [id number]  CPM Left Knee CPM Left Knee: On Left Knee Flexion (Degrees): 90  Left Knee Extension (Degrees): 0  Additional Comments: trapeze bar   Shawnie Pons 06/28/2012, 12:21 PM

## 2012-06-28 NOTE — Progress Notes (Signed)
UR COMPLETED  

## 2012-06-28 NOTE — Op Note (Addendum)
TOTAL KNEE REPLACEMENT OPERATIVE NOTE:  06/28/2012  9:29 AM  PATIENT:  Melissa Jordan  67 y.o. female  PRE-OPERATIVE DIAGNOSIS:  medial compartment osteoarthritis, left knee  POST-OPERATIVE DIAGNOSIS:  medial compartment osteoarthritis, left knee  PROCEDURE:  Procedure(s): UNICOMPARTMENTAL KNEE  SURGEON:  Surgeon(s): Dannielle Huh, MD  PHYSICIAN ASSISTANT: Altamese Cabal, [facility]  ANESTHESIA:   general  DRAINS: Hemovac and On-Q Marcaine Pain Pump  SPECIMEN: None  COUNTS:  Correct  TOURNIQUET:  * Missing tourniquet times found for documented tourniquets in log:  16109 *  DICTATION:  Indication for procedure:    The patient is a 67 y.o. female who has failed conservative treatment for medial compartment osteoarthritis, left knee.  Informed consent was obtained prior to anesthesia. The risks versus benefits of the operation were explain and in a way the patient can, and did, understand.   Description of procedure:     The patient was taken to the operating room and placed under anesthesia.  The patient was positioned in the usual fashion taking care that all body parts were adequately padded and/or protected.  I foley catheter was not placed.  A tourniquet was applied and the leg prepped and draped in the usual sterile fashion.  The extremity was exsanguinated with the esmarch and tourniquet inflated to 350 mmHg.  Pre-operative range of motion was normal.  The knee was in 5 degree of significant varus.  A midline incision approximately 6-7 inches long was made with a #10 blade.  A new blade was used to make a parapatellar arthrotomy going 2-3 cm into the quadriceps tendon, over the patella, and alongside the medial aspect of the patellar tendon.  A synovectomy was then performed with the #10 blade and forceps. I then elevated the deep MCL off the medial tibial metaphysis subperiosteally around to the semimembranosus attachment.    I everted the patella and used calipers to measure  patellar thickness.  I used the reamer to ream down to appropriate thickness to recreate the native thickness.  I then removed excess bone with the rongeur and sagittal saw.  I used the appropriately sized template and drilled the three lug holes.  I then put the trial in place and measured the thickness with the calipers to ensure recreation of the native thickness.  The trial was then removed and the patella subluxed and the knee brought into flexion.  A homan retractor was place to retract and protect the patella and lateral structures.  A Z-retractor was place medially to protect the medial structures.  The extra-medullary alignment system was used to make cut the tibial articular surface perpendicular to the anamotic axis of the tibia and in 3 degrees of posterior slope.  The cut surface and alignment jig was removed.  I then used the intramedullary alignment guide to make a 6 valgus cut on the distal femur.  I then marked out the epicondylar axis on the distal femur.  The posterior condylar axis measured 5 degrees.  I then used the anterior referencing sizer and measured the femur to be a size G.  The 4-In-1 cutting block was screwed into place in external rotation matching the posterior condylar angle, making our cuts perpendicular to the epicondylar axis.  Anterior, posterior and chamfer cuts were made with the sagittal saw.  The cutting block and cut pieces were removed.  A lamina spreader was placed in 90 degrees of flexion.  The ACL, PCL, menisci, and posterior condylar osteophytes were removed.  A 17 mm  spacer blocked was found to offer good flexion and extension gap balance after minimal in degree releasing.   The scoop retractor was then placed and the femoral finishing block was pinned in place.  The small sagittal saw was used as well as the lug drill to finish the femur.  The block and cut surfaces were removed and the medullary canal hole filled with autograft bone from the cut pieces.  The  tibia was delivered forward in deep flexion and external rotation.  A size 8 tray was selected and pinned into place centered on the medial 1/3 of the tibial tubercle.  The reamer and keel was used to prepare the tibia through the tray.    I then trialed with the size G femur, size 8 tibia, a 17 mm insert and the 38 patella.  I had excellent flexion/extension gap balance, excellent patella tracking.  Flexion was full and beyond 120 degrees; extension was zero.  These components were chosen and the staff opened them to me on the back table while the knee was lavaged copiously and the cement mixed.  I cemented in the components and removed all excess cement.  The polyethylene tibial component was snapped into place and the knee placed in extension while cement was hardening.  The capsule was infilltrated with 20cc of .25% Marcaine with epinephrine.  A hemovac was place in the joint exiting superolaterally.  A pain pump was place superomedially superficial to the arthrotomy.  Once the cement was hard, the tourniquet was let down.  Hemostasis was obtained.  The arthrotomy was closed with figure-8 #1 vicryl sutures.  The deep soft tissues were closed with #0 vicryls and the subcuticular layer closed with a running #2-0 vicryl.  The skin was reapproximated and closed with skin staples.  The wound was dressed with xeroform, 4 x4's, 2 ABD sponges, a single layer of webril and a TED stocking.   The patient was then awakened, extubated, and taken to the recovery room in stable condition.  BLOOD LOSS:  300cc DRAINS: 1 hemovac, 1 pain catheter COMPLICATIONS:  None.  PLAN OF CARE: Admit to inpatient   PATIENT DISPOSITION:  PACU - hemodynamically stable.   Delay start of Pharmacological VTE agent (>24hrs) due to surgical blood loss or risk of bleeding:  not applicable  Please fax a copy of this op note to my office at [phone] (please only include page 1 and 2 of the Case Information op  note)              TOTAL KNEE REPLACEMENT OPERATIVE NOTE:  06/28/2012  9:30 AM  PATIENT:  Melissa Jordan  67 y.o. female  PRE-OPERATIVE DIAGNOSIS:  medial compartment osteoarthritis, left knee  POST-OPERATIVE DIAGNOSIS:  medial compartment osteoarthritis, left knee  PROCEDURE:  Procedure(s): UNICOMPARTMENTAL KNEE  SURGEON:  Surgeon(s): Dannielle Huh, MD  PHYSICIAN ASSISTANT: Altamese Cabal, [facility]  ANESTHESIA:   general  DRAINS: Hemovac and On-Q Marcaine Pain Pump  SPECIMEN: None  COUNTS:  Correct  TOURNIQUET:  * Missing tourniquet times found for documented tourniquets in log:  13244 *  DICTATION:  Indication for procedure:    The patient is a 67 y.o. female who has failed conservative treatment for medial compartment osteoarthritis, left knee.  Informed consent was obtained prior to anesthesia. The risks versus benefits of the operation were explain and in a way the patient can, and did, understand.   Description of procedure:     The patient was taken to the operating room  and placed under anesthesia.  The patient was positioned in the usual fashion taking care that all body parts were adequately padded and/or protected.  I foley catheter was not placed.  A tourniquet was applied and the leg prepped and draped in the usual sterile fashion.  The extremity was exsanguinated with the esmarch and tourniquet inflated to 350 mmHg.  Pre-operative range of motion was normal.  The knee was in 5 degree of significant varus.  A midline incision approximately 6-7 inches long was made with a #10 blade.  A new blade was used to make a parapatellar arthrotomy going 2-3 cm into the quadriceps tendon, over the patella, and alongside the medial aspect of the patellar tendon.  A synovectomy was then performed with the #10 blade and forceps. I then elevated the deep MCL off the medial tibial metaphysis subperiosteally around to the semimembranosus attachment.    I everted the patella  and used calipers to measure patellar thickness.  I used the reamer to ream down to appropriate thickness to recreate the native thickness.  I then removed excess bone with the rongeur and sagittal saw.  I used the appropriately sized template and drilled the three lug holes.  I then put the trial in place and measured the thickness with the calipers to ensure recreation of the native thickness.  The trial was then removed and the patella subluxed and the knee brought into flexion.  A homan retractor was place to retract and protect the patella and lateral structures.  A Z-retractor was place medially to protect the medial structures.  The extra-medullary alignment system was used to make cut the tibial articular surface perpendicular to the anamotic axis of the tibia and in 3 degrees of posterior slope.  The cut surface and alignment jig was removed.  I then used the intramedullary alignment guide to make a 6 valgus cut on the distal femur.  I then marked out the epicondylar axis on the distal femur.  The posterior condylar axis measured 5 degrees.  I then used the anterior referencing sizer and measured the femur to be a size G.  The 4-In-1 cutting block was screwed into place in external rotation matching the posterior condylar angle, making our cuts perpendicular to the epicondylar axis.  Anterior, posterior and chamfer cuts were made with the sagittal saw.  The cutting block and cut pieces were removed.  A lamina spreader was placed in 90 degrees of flexion.  The ACL, PCL, menisci, and posterior condylar osteophytes were removed.  A 17 mm spacer blocked was found to offer good flexion and extension gap balance after minimal in degree releasing.   The scoop retractor was then placed and the femoral finishing block was pinned in place.  The small sagittal saw was used as well as the lug drill to finish the femur.  The block and cut surfaces were removed and the medullary canal hole filled with autograft  bone from the cut pieces.  The tibia was delivered forward in deep flexion and external rotation.  A size 8 tray was selected and pinned into place centered on the medial 1/3 of the tibial tubercle.  The reamer and keel was used to prepare the tibia through the tray.    I then trialed with the size G femur, size 8 tibia, a 17 mm insert and the 38 patella.  I had excellent flexion/extension gap balance, excellent patella tracking.  Flexion was full and beyond 120 degrees; extension was zero.  These  components were chosen and the staff opened them to me on the back table while the knee was lavaged copiously and the cement mixed.  I cemented in the components and removed all excess cement.  The polyethylene tibial component was snapped into place and the knee placed in extension while cement was hardening.  The capsule was infilltrated with 20cc of .25% Marcaine with epinephrine.  A hemovac was place in the joint exiting superolaterally.  A pain pump was place superomedially superficial to the arthrotomy.  Once the cement was hard, the tourniquet was let down.  Hemostasis was obtained.  The arthrotomy was closed with figure-8 #1 vicryl sutures.  The deep soft tissues were closed with #0 vicryls and the subcuticular layer closed with a running #2-0 vicryl.  The skin was reapproximated and closed with skin staples.  The wound was dressed with xeroform, 4 x4's, 2 ABD sponges, a single layer of webril and a TED stocking.   The patient was then awakened, extubated, and taken to the recovery room in stable condition.  BLOOD LOSS:  300cc DRAINS: 1 hemovac, 1 pain catheter COMPLICATIONS:  None.  PLAN OF CARE: Admit to inpatient   PATIENT DISPOSITION:  PACU - hemodynamically stable.   Delay start of Pharmacological VTE agent (>24hrs) due to surgical blood loss or risk of bleeding:  not applicable  Please fax a copy of this op note to my office at [phone] (please only include page 1 and 2 of the Case  Information op note)              wrong dictation; please delete

## 2012-06-28 NOTE — Transfer of Care (Signed)
Immediate Anesthesia Transfer of Care Note  Patient: Melissa Jordan  Procedure(s) Performed: Procedure(s) (LRB) with comments: UNICOMPARTMENTAL KNEE (Left)  Patient Location: PACU  Anesthesia Type:General  Level of Consciousness: awake, alert  and oriented  Airway & Oxygen Therapy: Patient Spontanous Breathing and Patient connected to nasal cannula oxygen  Post-op Assessment: Report given to PACU RN and Post -op Vital signs reviewed and stable  Post vital signs: Reviewed and stable  Complications: No apparent anesthesia complications

## 2012-06-28 NOTE — Evaluation (Signed)
Physical Therapy Evaluation Patient Details Name: Melissa Jordan MRN: [id number] DOB: 26-Feb-1946 Today's Date: 06/28/2012 Time: 1308-6578 PT Time Calculation (min): 44 min  PT Assessment / Plan / Recommendation Clinical Impression  pt rpesents with L Unilateral knee replacement.  pt very motivated, but limited by nerve block and lack of control in L knee.      PT Assessment  Patient needs continued PT services    Follow Up Recommendations  Home health PT;Supervision/Assistance - 24 hour    Does the patient have the potential to tolerate intense rehabilitation      Barriers to Discharge None      Equipment Recommendations  Rolling walker with 5" wheels    Recommendations for Other Services     Frequency 7X/week    Precautions / Restrictions Precautions Precautions: Knee Restrictions Weight Bearing Restrictions: Yes LLE Weight Bearing: Weight bearing as tolerated   Pertinent Vitals/Pain Denies pain secondary to still numb from nerve block.        Mobility  Bed Mobility Bed Mobility: Supine to Sit;Sitting - Scoot to Edge of Bed Supine to Sit: 4: Min assist;With rails Sitting - Scoot to [place] of Bed: 4: Min assist Details for Bed Mobility Assistance: A with L LE only.   Transfers Transfers: Sit to Stand;Stand to Sit;Stand Pivot Transfers Sit to Stand: 3: Mod assist;With upper extremity assist;From bed;From chair/3-in-1 Stand to Sit: 3: Mod assist;With upper extremity assist;To chair/3-in-1;With armrests Stand Pivot Transfers: 1: +2 Total assist Stand Pivot Transfers: Patient Percentage: 30% Details for Transfer Assistance: pt with no control at L knee causing pt to require 2 person A for anything other than simply standing.  cues for use of UEs and sequencing transfer.   Ambulation/Gait Ambulation/Gait Assistance: Not tested (comment) Stairs: No Wheelchair Mobility Wheelchair Mobility: No    Shoulder Instructions     Exercises Total Joint Exercises Ankle  Circles/Pumps: AROM;Both;10 reps Quad Sets: AROM;Both;10 reps   PT Diagnosis: Abnormality of gait  PT Problem List: Decreased strength;Decreased range of motion;Decreased activity tolerance;Decreased balance;Decreased mobility;Decreased knowledge of use of DME PT Treatment Interventions: DME instruction;Gait training;Stair training;Functional mobility training;Therapeutic activities;Therapeutic exercise;Balance training;Patient/family education   PT Goals Acute Rehab PT Goals PT Goal Formulation: With patient Time For Goal Achievement: 07/05/12 Potential to Achieve Goals: Good Pt will go Supine/Side to Sit: with modified independence PT Goal: Supine/Side to Sit - Progress: Goal set today Pt will go Sit to Supine/Side: with modified independence PT Goal: Sit to Supine/Side - Progress: Goal set today Pt will go Sit to Stand: with modified independence PT Goal: Sit to Stand - Progress: Goal set today Pt will go Stand to Sit: with modified independence PT Goal: Stand to Sit - Progress: Goal set today Pt will Ambulate: >150 feet;with modified independence;with rolling walker PT Goal: Ambulate - Progress: Goal set today Pt will Go Up / Down Stairs: Flight;with min assist;with rail(s) PT Goal: Up/Down Stairs - Progress: Goal set today Pt will Perform Home Exercise Program: with supervision, verbal cues required/provided PT Goal: Perform Home Exercise Program - Progress: Goal set today  Visit Information  Last PT Received On: 06/28/12 Assistance Needed: +2    Subjective Data  Subjective: I have no feeling in that leg right now.   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    Cognition  Overall Cognitive Status: Appears within functional limits for tasks assessed/performed Arousal/Alertness: Awake/alert Orientation Level: Appears intact for tasks assessed Behavior During Session: [facility] for tasks performed    Extremity/Trunk Assessment Right Lower Extremity Assessment RLE ROM/Strength/Tone: [facility] for tasks assessed RLE Sensation: WFL - Light Touch Left Lower Extremity Assessment LLE ROM/Strength/Tone: Deficits LLE ROM/Strength/Tone Deficits: pt nerve block preventing from having any active movment at knee and limited control at hip.   LLE Sensation: Deficits LLE Sensation Deficits: pt notes LE feels numb from nerve block.   Trunk Assessment Trunk Assessment: Normal   Balance Balance Balance Assessed: No  End of Session PT - End of Session Equipment Utilized During Treatment: Gait belt Activity Tolerance: Patient tolerated treatment well Patient left: in chair;with call bell/phone within reach;with family/visitor present Nurse Communication: Mobility status CPM Left Knee CPM Left Knee: Off Left Knee Flexion (Degrees): 90  Left Knee Extension (Degrees): 0  Additional Comments: trapeze bar  GP     RitenourAlison Murray, PT [phone] 06/28/2012, 3:12 PM

## 2012-06-28 NOTE — Anesthesia Postprocedure Evaluation (Signed)
  Anesthesia Post-op Note  Patient: ARIAM MOL  Procedure(s) Performed: Procedure(s) (LRB) with comments: UNICOMPARTMENTAL KNEE (Left)  Patient Location: PACU  Anesthesia Type:GA combined with regional for post-op pain  Level of Consciousness: awake, alert , oriented and patient cooperative  Airway and Oxygen Therapy: Patient Spontanous Breathing  Post-op Pain: mild  Post-op Assessment: Post-op Vital signs reviewed, Patient's Cardiovascular Status Stable, Respiratory Function Stable, Patent Airway, No signs of Nausea or vomiting and Pain level controlled  Post-op Vital Signs: Reviewed and stable  Complications: No apparent anesthesia complications

## 2012-06-28 NOTE — Progress Notes (Signed)
[facility]  Patient Status: New  [facility] is providing the following services: PT  Referral from MD office - thank you  If patient discharges after hours, please call [phone].   Jodene Nam 06/28/2012, 9:13 PM

## 2012-06-28 NOTE — Anesthesia Preprocedure Evaluation (Signed)
Anesthesia Evaluation  Patient identified by MRN, date of birth, ID band Patient awake    Reviewed: Allergy & Precautions, H&P , NPO status , Patient's Chart, lab work & pertinent test results, reviewed documented beta blocker date and time   History of Anesthesia Complications (+) PONV  Airway Mallampati: II TM Distance: >3 FB Neck ROM: Full    Dental  (+) Missing and Dental Advisory Given   Pulmonary neg pulmonary ROS, neg pneumonia -,  breath sounds clear to auscultation  Pulmonary exam normal       Cardiovascular hypertension, Pt. on medications and Pt. on home beta blockers DOE: controlled with metoprolol. + dysrhythmias Supra Ventricular Tachycardia Rate:Normal     Neuro/Psych negative neurological ROS     GI/Hepatic Neg liver ROS, GERD-  Controlled,  Endo/Other  negative endocrine ROS  Renal/GU negative Renal ROS     Musculoskeletal   Abdominal   Peds  Hematology   Anesthesia Other Findings   Reproductive/Obstetrics                           Anesthesia Physical Anesthesia Plan  ASA: II  Anesthesia Plan: General   Post-op Pain Management:    Induction: Intravenous  Airway Management Planned: LMA  Additional Equipment:   Intra-op Plan:   Post-operative Plan:   Informed Consent: I have reviewed the patients History and Physical, chart, labs and discussed the procedure including the risks, benefits and alternatives for the proposed anesthesia with the patient or authorized representative who has indicated his/her understanding and acceptance.   Dental advisory given  Plan Discussed with: CRNA and Surgeon  Anesthesia Plan Comments: (Plan routine monitors, GA- LMA OK, femoral nerve block for post op analgesia)        Anesthesia Quick Evaluation

## 2012-06-29 ENCOUNTER — Encounter (HOSPITAL_COMMUNITY): Payer: Self-pay | Admitting: Orthopedic Surgery

## 2012-06-29 LAB — CBC
HCT: 33.7 % — ABNORMAL LOW (ref 36.0–46.0)
Hemoglobin: 11 g/dL — ABNORMAL LOW (ref 12.0–15.0)
MCH: 29.4 pg (ref 26.0–34.0)
MCHC: 32.6 g/dL (ref 30.0–36.0)
MCV: 90.1 fL (ref 78.0–100.0)
Platelets: 255 10*3/uL (ref 150–400)
RBC: 3.74 MIL/uL — ABNORMAL LOW (ref 3.87–5.11)
RDW: 13.3 % (ref 11.5–15.5)
WBC: 14 10*3/uL — ABNORMAL HIGH (ref 4.0–10.5)

## 2012-06-29 LAB — BASIC METABOLIC PANEL
BUN: 14 mg/dL (ref 6–23)
CO2: 27 mEq/L (ref 19–32)
Calcium: 8.1 mg/dL — ABNORMAL LOW (ref 8.4–10.5)
Chloride: 103 mEq/L (ref 96–112)
Creatinine, Ser: 0.7 mg/dL (ref 0.50–1.10)
GFR calc Af Amer: >90 mL/min (ref >90)
GFR calc non Af Amer: 88 mL/min — ABNORMAL LOW (ref >90)
Glucose, Bld: 96 mg/dL (ref 70–99)
Potassium: 3.8 mEq/L (ref 3.5–5.1)
Sodium: 138 mEq/L (ref 135–145)

## 2012-06-29 NOTE — Progress Notes (Signed)
Physical Therapy Treatment Patient Details Name: Melissa Jordan MRN: [id number] DOB: 1945/07/27 Today's Date: 06/29/2012 Time: 4098-1191 PT Time Calculation (min): 25 min  PT Assessment / Plan / Recommendation Comments on Treatment Session  Patient continues to progress with ambulation. Hasnt felt well today and has been sick to her stomach. Patient agreeable to sit up in the recliner and attempt to eat. Will progress mobility and attempt stairs tomorrow.    Follow Up Recommendations  Home health PT;Supervision/Assistance - 24 hour     Does the patient have the potential to tolerate intense rehabilitation     Barriers to Discharge        Equipment Recommendations  Rolling walker with 5" wheels    Recommendations for Other Services    Frequency 7X/week   Plan Discharge plan remains appropriate;Frequency remains appropriate    Precautions / Restrictions Precautions Precautions: Knee Restrictions LLE Weight Bearing: Weight bearing as tolerated   Pertinent Vitals/Pain     Mobility  Bed Mobility Supine to Sit: 4: Min guard Transfers Sit to Stand: 4: Min guard;With upper extremity assist;From bed;From toilet Stand to Sit: 4: Min guard;With upper extremity assist;To toilet;To chair/3-in-1 Details for Transfer Assistance: Cues for safe technique Ambulation/Gait Ambulation/Gait Assistance: 4: Min guard Ambulation Distance (Feet): 80 Feet Assistive device: Rolling walker Ambulation/Gait Assistance Details: Cues for posture and heel strike. Patient vaulting to avoid weight through L LE Gait Pattern: Step-to pattern;Decreased step length - right;Decreased step length - left;Trunk flexed    Exercises     PT Diagnosis:    PT Problem List:   PT Treatment Interventions:     PT Goals Acute Rehab PT Goals PT Goal: Supine/Side to Sit - Progress: Progressing toward goal PT Goal: Sit to Stand - Progress: Progressing toward goal PT Goal: Stand to Sit - Progress: Progressing toward  goal PT Goal: Ambulate - Progress: Progressing toward goal  Visit Information  Last PT Received On: 06/29/12 Assistance Needed: +1    Subjective Data      Cognition  Overall Cognitive Status: Appears within functional limits for tasks assessed/performed Arousal/Alertness: Awake/alert Orientation Level: Appears intact for tasks assessed Behavior During Session: [facility] for tasks performed    Balance     End of Session PT - End of Session Equipment Utilized During Treatment: Gait belt Activity Tolerance: Patient tolerated treatment well Patient left: in chair;with call bell/phone within reach Nurse Communication: Mobility status   GP     Fredrich Birks 06/29/2012, 2:04 PM 06/29/2012 Fredrich Birks PTA [phone] pager [phone] office

## 2012-06-29 NOTE — Progress Notes (Signed)
CARE MANAGEMENT NOTE 06/29/2012  Patient:  Melissa Jordan, Melissa Jordan   Account Number:  [id number]  Date Initiated:  06/29/2012  Documentation initiated by:  Vance Peper  Subjective/Objective Assessment:   67 yr old female left total knee arthroplasty.     Action/Plan:   CM spoke witih patient concerning home health and DME needs at discharge. Patient preoperatively setup with [facility], no changes. DME to be delivered by [facility].   Anticipated DC Date:  06/30/2012   Anticipated DC Plan:  HOME W HOME HEALTH SERVICES      DC Planning Services  CM consult      [facility] Choice  HOME HEALTH   Choice offered to / List presented to:  C-1 Patient        HH arranged  HH-2 PT      [facility] agency  [facility]   Status of service:  Completed, signed off Medicare Important Message given?   (If response is "NO", the following Medicare IM given date fields will be blank) Date Medicare IM given:   Date Additional Medicare IM given:    Discharge Disposition:  HOME W HOME HEALTH SERVICES  Per UR Regulation:    If discussed at Long Length of Stay Meetings, dates discussed:    Comments:

## 2012-06-29 NOTE — Progress Notes (Signed)
SPORTS MEDICINE AND JOINT REPLACEMENT  Melissa Spurling, MD   Altamese Cabal, PA-C [address] [place], [place], [place]  [address]                             [phone]   PROGRESS NOTE  Subjective:  negative for Chest Pain  negative for Shortness of Breath  positive for Nausea/Vomiting   positive for Calf Pain  negative for Bowel Movement   Tolerating Diet: yes         Patient reports pain as 6 on 0-10 scale.    Objective: Vital signs in last 24 hours:   Patient Vitals for the past 24 hrs:  BP Temp Temp src Pulse Resp SpO2  06/29/12 1400 83/43 mmHg 98.2 F (36.8 C) Oral 70  18  98 %  06/29/12 0800 - - - - 18  -  06/29/12 0712 125/56 mmHg 98.2 F (36.8 C) - 65  18  99 %  06/29/12 0330 - - - - 16  97 %  06/29/12 0000 - - - - 16  97 %  06/28/12 2303 119/55 mmHg 98.7 F (37.1 C) - 68  20  97 %  06/28/12 2000 - - - - 16  97 %  06/28/12 1804 126/72 mmHg 98.1 F (36.7 C) - 69  16  99 %    @flow {1959:LAST@   Intake/Output from previous day:   01/06 0701 - 01/07 0700 In: 1466.3 [I.V.:1466.3] Out: 650 [Urine:650]   Intake/Output this shift:   01/07 0701 - 01/07 1900 In: 480 [P.O.:480] Out: -    Intake/Output      01/06 0701 - 01/07 0700 01/07 0701 - 01/08 0700   P.O.  480   I.V. 1466.3    Total Intake 1466.3 480   Urine 650    Total Output 650    Net +816.3 +480        Urine Occurrence  1 x   Emesis Occurrence 2 x       LABORATORY DATA:  Basename 06/29/12 0530 06/28/12 1326  WBC 14.0* 18.9*  HGB 11.0* 12.9  HCT 33.7* 39.4  PLT 255 289    Basename 06/29/12 0530 06/28/12 1326  NA 138 --  K 3.8 --  CL 103 --  CO2 27 --  BUN 14 --  CREATININE 0.70 0.55  GLUCOSE 96 --  CALCIUM 8.1* --   Lab Results  Component Value Date   INR 1.08 06/18/2012   INR 0.96 01/15/2010   INR 1.0 07/05/2008    Examination:  General appearance: alert, cooperative and no distress Extremities: edema slightly positive homan's  Wound Exam: clean, dry, intact    Drainage:  None: wound tissue dry  Motor Exam: EHL and FHL Intact  Sensory Exam: Deep Peroneal normal  Vascular Exam:    Assessment:    1 Day Post-Op  Procedure(s) (LRB): UNICOMPARTMENTAL KNEE (Left)  ADDITIONAL DIAGNOSIS:  Active Problems:  * No active hospital problems. *   Acute Blood Loss Anemia   Plan: Physical Therapy as ordered Weight Bearing as Tolerated (WBAT)  DVT Prophylaxis:  Lovenox  DISCHARGE PLAN: Home  DISCHARGE NEEDS: HHPT, CPM, Walker and 3-in-1 comode seat         Mehtaab Mayeda 06/29/2012, 5:19 PM

## 2012-06-29 NOTE — Progress Notes (Signed)
Physical Therapy Treatment Patient Details Name: Melissa Jordan MRN: [id number] DOB: [birth year] Today's Date: 06/29/2012 Time: 0865-7846 PT Time Calculation (min): 24 min  PT Assessment / Plan / Recommendation Comments on Treatment Session  Patient progressing well with mobility. Patient not wanting to put heel down or weight through L foot. Will continue to progress with ambulation this afternoon. Patient should progress well enough to discharge tomorrow. Will need to attempt a flight of steps in order to DC home    Follow Up Recommendations  Home health PT;Supervision/Assistance - 24 hour     Does the patient have the potential to tolerate intense rehabilitation     Barriers to Discharge        Equipment Recommendations  Rolling walker with 5" wheels    Recommendations for Other Services    Frequency 7X/week   Plan Discharge plan remains appropriate;Frequency remains appropriate    Precautions / Restrictions Precautions Precautions: Knee Restrictions LLE Weight Bearing: Weight bearing as tolerated   Pertinent Vitals/Pain     Mobility  Bed Mobility Bed Mobility: Not assessed Transfers Sit to Stand: 4: Min guard;With upper extremity assist;From chair/3-in-1 Stand to Sit: 4: Min guard;With upper extremity assist;To chair/3-in-1 Details for Transfer Assistance: Patient stood x2 with cues for positioning and hand placement. Cues to sit slowly Ambulation/Gait Ambulation/Gait Assistance: 4: Min guard Ambulation Distance (Feet): 60 Feet Assistive device: Rolling walker Ambulation/Gait Assistance Details: Patient ambulated with one seated rest break. Patient not wanting to put heel down on LLE of fear of buckling. Cues for heel strike and sequency with RW.  Gait Pattern: Step-to pattern;Decreased step length - right;Decreased step length - left;Trunk flexed    Exercises Total Joint Exercises Quad Sets: AROM;Left;10 reps Heel Slides: AAROM;Left;10 reps Hip  ABduction/ADduction: AAROM;Left;10 reps Straight Leg Raises: AAROM;Left;5 reps   PT Diagnosis:    PT Problem List:   PT Treatment Interventions:     PT Goals Acute Rehab PT Goals PT Goal: Sit to Stand - Progress: Progressing toward goal PT Goal: Stand to Sit - Progress: Progressing toward goal PT Goal: Ambulate - Progress: Progressing toward goal PT Goal: Perform Home Exercise Program - Progress: Progressing toward goal  Visit Information  Last PT Received On: 06/29/12 Assistance Needed: +1    Subjective Data      Cognition  Overall Cognitive Status: Appears within functional limits for tasks assessed/performed Arousal/Alertness: Awake/alert Orientation Level: Appears intact for tasks assessed Behavior During Session: [facility] for tasks performed    Balance     End of Session PT - End of Session Equipment Utilized During Treatment: Gait belt Activity Tolerance: Patient tolerated treatment well Patient left: in chair;with call bell/phone within reach Nurse Communication: Mobility status   GP     Fredrich Birks 06/29/2012, 9:56 AM 06/29/2012 Fredrich Birks PTA [phone] pager [phone] office

## 2012-06-30 DIAGNOSIS — M79609 Pain in unspecified limb: Secondary | ICD-10-CM

## 2012-06-30 LAB — CBC
HCT: 34.3 % — ABNORMAL LOW (ref 36.0–46.0)
Hemoglobin: 11.1 g/dL — ABNORMAL LOW (ref 12.0–15.0)
MCH: 29.5 pg (ref 26.0–34.0)
MCHC: 32.4 g/dL (ref 30.0–36.0)
MCV: 91.2 fL (ref 78.0–100.0)
Platelets: 235 10*3/uL (ref 150–400)
RBC: 3.76 MIL/uL — ABNORMAL LOW (ref 3.87–5.11)
RDW: 13.6 % (ref 11.5–15.5)
WBC: 11.4 10*3/uL — ABNORMAL HIGH (ref 4.0–10.5)

## 2012-06-30 LAB — BASIC METABOLIC PANEL
BUN: 16 mg/dL (ref 6–23)
CO2: 28 mEq/L (ref 19–32)
Calcium: 8.3 mg/dL — ABNORMAL LOW (ref 8.4–10.5)
Chloride: 103 mEq/L (ref 96–112)
Creatinine, Ser: 0.7 mg/dL (ref 0.50–1.10)
GFR calc Af Amer: >90 mL/min (ref >90)
GFR calc non Af Amer: 88 mL/min — ABNORMAL LOW (ref >90)
Glucose, Bld: 95 mg/dL (ref 70–99)
Potassium: 3.7 mEq/L (ref 3.5–5.1)
Sodium: 139 mEq/L (ref 135–145)

## 2012-06-30 MED ORDER — METHOCARBAMOL 500 MG PO TABS: 500.0000 mg | ORAL_TABLET | Freq: Four times a day (QID) | ORAL | Status: DC | PRN

## 2012-06-30 MED ORDER — METOCLOPRAMIDE HCL 5 MG PO TABS: 5.0000 mg | ORAL_TABLET | Freq: Three times a day (TID) | ORAL | Status: DC | PRN

## 2012-06-30 MED ORDER — CELECOXIB 200 MG PO CAPS: 200.0000 mg | ORAL_CAPSULE | Freq: Two times a day (BID) | ORAL | Status: DC

## 2012-06-30 MED ORDER — OXYCODONE HCL 5 MG PO TABS: 5.0000 mg | ORAL_TABLET | ORAL | Status: DC | PRN

## 2012-06-30 MED ORDER — ENOXAPARIN SODIUM 40 MG/0.4ML ~~LOC~~ SOLN: 40.0000 mg | Freq: Every day | SUBCUTANEOUS | Status: DC

## 2012-06-30 MED ORDER — OXYCODONE HCL ER 10 MG PO T12A: 10.0000 mg | EXTENDED_RELEASE_TABLET | Freq: Two times a day (BID) | ORAL | Status: DC

## 2012-06-30 NOTE — Progress Notes (Signed)
Physical Therapy Treatment Patient Details Name: Melissa Jordan MRN: [id number] DOB: [birth year] Today's Date: 06/30/2012 Time: 4098-1191 PT Time Calculation (min): 24 min  PT Assessment / Plan / Recommendation Comments on Treatment Session  Approached by nursing that patient became uncomfortable with going home and decided that she wanted to attempt steps again. Was able to ambulate stairs without difficulty. Will have assistance from family at home    Follow Up Recommendations  Home health PT;Supervision/Assistance - 24 hour     Does the patient have the potential to tolerate intense rehabilitation     Barriers to Discharge        Equipment Recommendations  Rolling walker with 5" wheels    Recommendations for Other Services    Frequency 7X/week   Plan Discharge plan remains appropriate;Frequency remains appropriate    Precautions / Restrictions Precautions Precautions: Knee Restrictions LLE Weight Bearing: Weight bearing as tolerated   Pertinent Vitals/Pain     Mobility  Bed Mobility Supine to Sit: 6: Modified independent (Device/Increase time) Sitting - Scoot to Edge of Bed: 4: Min assist Details for Bed Mobility Assistance: A for LEs back into bed Transfers Sit to Stand: 6: Modified independent (Device/Increase time) Stand to Sit: 6: Modified independent (Device/Increase time) Ambulation/Gait Ambulation/Gait Assistance: 4: Min guard Ambulation Distance (Feet): 80 Feet Assistive device: Rolling walker Ambulation/Gait Assistance Details: Patient progressing with ambulation, still having difficulty getting L heel to the ground Gait Pattern: Step-to pattern;Trunk flexed Stairs: No (Patient states she feels comfortable with steps. ) Stairs Assistance: 4: Min assist Stairs Assistance Details (indicate cue type and reason): Min for HHA and balance Stair Management Technique: Step to pattern;Forwards;One rail Right Number of Stairs: 5     Exercises     PT Diagnosis:     PT Problem List:   PT Treatment Interventions:     PT Goals Acute Rehab PT Goals PT Goal: Supine/Side to Sit - Progress: Met PT Goal: Sit to Supine/Side - Progress: Progressing toward goal PT Goal: Sit to Stand - Progress: Met PT Goal: Stand to Sit - Progress: Met PT Goal: Ambulate - Progress: Progressing toward goal PT Goal: Up/Down Stairs - Progress: Progressing toward goal  Visit Information  Last PT Received On: 06/30/12 Assistance Needed: +1    Subjective Data      Cognition  Overall Cognitive Status: Appears within functional limits for tasks assessed/performed Arousal/Alertness: Awake/alert Orientation Level: Appears intact for tasks assessed Behavior During Session: [facility] for tasks performed    Balance     End of Session PT - End of Session Equipment Utilized During Treatment: Gait belt Activity Tolerance: Patient tolerated treatment well Patient left: in chair;with call bell/phone within reach Nurse Communication: Mobility status CPM Left Knee CPM Left Knee: On Left Knee Flexion (Degrees): 45  Left Knee Extension (Degrees): 0    GP     Robinette, Adline Potter 06/30/2012, 3:09 PM 06/30/2012 Fredrich Birks PTA [phone] pager [phone] office

## 2012-06-30 NOTE — Progress Notes (Signed)
Physical Therapy Treatment Patient Details Name: Melissa Jordan MRN: [id number] DOB: 11/01/1945 Today's Date: 06/30/2012 Time: 6295-2841 PT Time Calculation (min): 20 min  PT Assessment / Plan / Recommendation Comments on Treatment Session  Patient needing to go to restroom. Patient deferred further stair instruction or increased ambulation. Patients transfers are much better and she is more stable with ambulation, including backing up. Planning on discharge this afternoon.     Follow Up Recommendations  Home health PT;Supervision/Assistance - 24 hour     Does the patient have the potential to tolerate intense rehabilitation     Barriers to Discharge        Equipment Recommendations  Rolling walker with 5" wheels    Recommendations for Other Services    Frequency 7X/week   Plan Discharge plan remains appropriate;Frequency remains appropriate    Precautions / Restrictions Precautions Precautions: Knee Restrictions LLE Weight Bearing: Weight bearing as tolerated   Pertinent Vitals/Pain     Mobility  Bed Mobility Supine to Sit: 6: Modified independent (Device/Increase time) Sitting - Scoot to Edge of Bed: 4: Min assist Details for Bed Mobility Assistance: A for LEs back into bed Transfers Sit to Stand: 6: Modified independent (Device/Increase time);From toilet;From bed Stand to Sit: 6: Modified independent (Device/Increase time);To toilet;To bed Stand Pivot Transfers: 5: Supervision Details for Transfer Assistance: Patient will good technique Ambulation/Gait Ambulation/Gait Assistance: 5: Supervision Ambulation Distance (Feet): 40 Feet Assistive device: Rolling walker Ambulation/Gait Assistance Details: Patient with increased weight through LEs.  Gait Pattern: Step-to pattern Gait velocity: decreased Stairs: No (Patient states she feels comfortable with steps. ) Stairs Assistance: 4: Min assist Stairs Assistance Details (indicate cue type and reason): Min A for HHA as  patient could not reach both rails. Can reach both rails at home Stair Management Technique: Step to pattern;Forwards;One rail Right Number of Stairs: 5     Exercises Total Joint Exercises Quad Sets: AROM;Left;10 reps Heel Slides: AAROM;Left;10 reps Hip ABduction/ADduction: AAROM;Left;10 reps Straight Leg Raises: AAROM;Left;10 reps   PT Diagnosis:    PT Problem List:   PT Treatment Interventions:     PT Goals Acute Rehab PT Goals PT Goal: Supine/Side to Sit - Progress: Met PT Goal: Sit to Supine/Side - Progress: Progressing toward goal PT Goal: Sit to Stand - Progress: Met PT Goal: Stand to Sit - Progress: Met PT Goal: Ambulate - Progress: Progressing toward goal PT Goal: Up/Down Stairs - Progress: Progressing toward goal PT Goal: Perform Home Exercise Program - Progress: Progressing toward goal  Visit Information  Last PT Received On: 06/30/12 Assistance Needed: +1    Subjective Data      Cognition  Overall Cognitive Status: Appears within functional limits for tasks assessed/performed Arousal/Alertness: Awake/alert Orientation Level: Appears intact for tasks assessed Behavior During Session: [facility] for tasks performed    Balance     End of Session PT - End of Session Equipment Utilized During Treatment: Gait belt Activity Tolerance: Patient tolerated treatment well Patient left: with call bell/phone within reach;in bed;in CPM CPM Left Knee CPM Left Knee: On Left Knee Flexion (Degrees): 50  Left Knee Extension (Degrees): 0    GP     Robinette, Adline Potter 06/30/2012, 1:40 PM 06/30/2012 Fredrich Birks PTA [phone] pager [phone] office

## 2012-06-30 NOTE — Progress Notes (Signed)
OT Note:  Pt screened for OT.  No needs for follow up nor bathroom DME.  [place], OTR/L [phone] 06/30/2012

## 2012-06-30 NOTE — Progress Notes (Signed)
Physical Therapy Treatment Patient Details Name: NEKEISHA AURE MRN: [id number] DOB: 27-Nov-1945 Today's Date: 06/30/2012 Time: 4098-1191 PT Time Calculation (min): 27 min  PT Assessment / Plan / Recommendation Comments on Treatment Session  Patient progressing well. Able to complete stairs this morning. PLanning on discharge this afternoon. Continue with current POC. Attempt stairs again later if patient comfortable    Follow Up Recommendations  Home health PT;Supervision/Assistance - 24 hour     Does the patient have the potential to tolerate intense rehabilitation     Barriers to Discharge        Equipment Recommendations  Rolling walker with 5" wheels    Recommendations for Other Services    Frequency 7X/week   Plan Discharge plan remains appropriate;Frequency remains appropriate    Precautions / Restrictions Precautions Precautions: Knee Restrictions LLE Weight Bearing: Weight bearing as tolerated   Pertinent Vitals/Pain     Mobility  Bed Mobility Supine to Sit: 5: Supervision Transfers Sit to Stand: 5: Supervision;With upper extremity assist;From chair/3-in-1;From toilet Stand to Sit: With upper extremity assist;To toilet;To chair/3-in-1 Stand Pivot Transfers: 5: Supervision Details for Transfer Assistance: Patient will good technique Ambulation/Gait Ambulation/Gait Assistance: 4: Min guard Ambulation Distance (Feet): 80 Feet Assistive device: Rolling walker Ambulation/Gait Assistance Details: Cues to heel strike on L side. Patient with decreased buckling and increased WBing through L leg Gait Pattern: Step-to pattern Gait velocity: decreased Stairs: Yes Stairs Assistance: 4: Min assist Stairs Assistance Details (indicate cue type and reason): Min A for HHA as patient could not reach both rails. Can reach both rails at home Stair Management Technique: Step to pattern;Forwards;One rail Right Number of Stairs: 5     Exercises Total Joint Exercises Quad Sets:  AROM;Left;10 reps Heel Slides: AAROM;Left;10 reps Hip ABduction/ADduction: AAROM;Left;10 reps Straight Leg Raises: AAROM;Left;10 reps   PT Diagnosis:    PT Problem List:   PT Treatment Interventions:     PT Goals Acute Rehab PT Goals PT Goal: Supine/Side to Sit - Progress: Progressing toward goal PT Goal: Sit to Stand - Progress: Progressing toward goal PT Goal: Stand to Sit - Progress: Progressing toward goal PT Goal: Ambulate - Progress: Progressing toward goal PT Goal: Up/Down Stairs - Progress: Progressing toward goal PT Goal: Perform Home Exercise Program - Progress: Progressing toward goal  Visit Information  Last PT Received On: 06/30/12 Assistance Needed: +1    Subjective Data      Cognition  Overall Cognitive Status: Appears within functional limits for tasks assessed/performed Arousal/Alertness: Awake/alert Orientation Level: Appears intact for tasks assessed Behavior During Session: [facility] for tasks performed    Balance     End of Session PT - End of Session Equipment Utilized During Treatment: Gait belt Activity Tolerance: Patient tolerated treatment well Patient left: in chair;with call bell/phone within reach   GP     Fredrich Birks 06/30/2012, 10:05 AM  ,06/30/2012 Fredrich Birks PTA [phone] pager [phone] office

## 2012-06-30 NOTE — Progress Notes (Signed)
VASCULAR LAB PRELIMINARY  PRELIMINARY  PRELIMINARY  PRELIMINARY  Left lower extremity venous duplex completed.    Preliminary report:  Left:  No evidence of DVT, superficial thrombosis, or Baker's cyst.  Kynslee Baham, RVT 06/30/2012, 10:32 AM

## 2012-06-30 NOTE — Progress Notes (Signed)
SPORTS MEDICINE AND JOINT REPLACEMENT  Georgena Spurling, MD   Altamese Cabal, PA-C [address] [place], [place], [place]  [address]                             [phone]   PROGRESS NOTE  Subjective:  negative for Chest Pain  negative for Shortness of Breath  negative for Nausea/Vomiting   negative for Calf Pain  negative for Bowel Movement   Tolerating Diet: yes         Patient reports pain as 5 on 0-10 scale.    Objective: Vital signs in last 24 hours:   Patient Vitals for the past 24 hrs:  BP Temp Temp src Pulse Resp SpO2  06/30/12 0709 138/69 mmHg 98.5 F (36.9 C) Axillary 88  16  96 %  06/30/12 0608 125/57 mmHg 97.5 F (36.4 C) Axillary 77  16  99 %  06/30/12 0015 - 99.4 F (37.4 C) - - - -  06/29/12 2201 110/53 mmHg 99.2 F (37.3 C) Oral 70  16  96 %  06/29/12 2130 120/60 mmHg - - - - -  06/29/12 1600 - - - - 18  -  06/29/12 1400 83/43 mmHg 98.2 F (36.8 C) Oral 70  18  98 %    @flow {1959:LAST@   Intake/Output from previous day:   01/07 0701 - 01/08 0700 In: 960 [P.O.:720; I.V.:240] Out: -    Intake/Output this shift:       Intake/Output      01/07 0701 - 01/08 0700 01/08 0701 - 01/09 0700   P.O. 720    I.V. 240    Total Intake 960    Urine     Total Output     Net +960         Urine Occurrence 2 x       LABORATORY DATA:  Basename 06/30/12 0605 06/29/12 0530 06/28/12 1326  WBC 11.4* 14.0* 18.9*  HGB 11.1* 11.0* 12.9  HCT 34.3* 33.7* 39.4  PLT 235 255 289    Basename 06/30/12 0605 06/29/12 0530 06/28/12 1326  NA 139 138 --  K 3.7 3.8 --  CL 103 103 --  CO2 28 27 --  BUN 16 14 --  CREATININE 0.70 0.70 0.55  GLUCOSE 95 96 --  CALCIUM 8.3* 8.1* --   Lab Results  Component Value Date   INR 1.08 06/18/2012   INR 0.96 01/15/2010   INR 1.0 07/05/2008    Examination:  General appearance: alert, cooperative and no distress Extremities: Homans sign is negative, no sign of DVT  Wound Exam: clean, dry, intact   Drainage:  None: wound  tissue dry  Motor Exam: EHL and FHL Intact  Sensory Exam: Deep Peroneal normal  Vascular Exam:    Assessment:    2 Days Post-Op  Procedure(s) (LRB): UNICOMPARTMENTAL KNEE (Left)  ADDITIONAL DIAGNOSIS:  Active Problems:  * No active hospital problems. *   Acute Blood Loss Anemia   Plan: Physical Therapy as ordered Weight Bearing as Tolerated (WBAT)  DVT Prophylaxis:  Lovenox  DISCHARGE PLAN: Home  DISCHARGE NEEDS: HHPT, CPM, Walker and 3-in-1 comode seat         Milagro Belmares 06/30/2012, 1:40 PM

## 2012-06-30 NOTE — Discharge Summary (Signed)
Melissa Spurling, MD   Altamese Cabal, PA-C [address] [place], [place], [place]  [address]                             [phone]  PATIENT ID: Melissa Jordan        MRN:  [id number]          DOB/AGE: November 25, 1945 / 67 y.o.    DISCHARGE SUMMARY  ADMISSION DATE:    06/28/2012 DISCHARGE DATE:   06/30/2012   ADMISSION DIAGNOSIS: medial compartment osteoarthritis    DISCHARGE DIAGNOSIS:  medial compartment osteoarthritis, left knee    ADDITIONAL DIAGNOSIS: Active Problems:  * No active hospital problems. *   Past Medical History  Diagnosis Date  . Allergy   . Arrhythmia     takes Metoprolol daily  . History of bronchitis     "when I get a bad cold; not chronic; I've had it a few times" (06/28/2012)  . Joint pain   . Joint swelling   . Osteopenia   . Bruises easily   . Seasonal allergies     takes Claritin daily  . Urinary urgency   . Complication of anesthesia     "BP bottoms out after OR" (06/28/2012)  . PONV (postoperative nausea and vomiting)   . Asthma   . Pneumonia     "couple times in the winters" (06/28/2012)  . GERD (gastroesophageal reflux disease)     "one time; really I think it was all due to drinking aspartame" (06/28/2012)  . Osteoarthritis   . Chronic lower back pain     PROCEDURE: Procedure(s): UNICOMPARTMENTAL KNEE on 06/28/2012  CONSULTS:     HISTORY:  See H&P in chart  HOSPITAL COURSE:  Melissa Jordan is a 67 y.o. admitted on 06/28/2012 and found to have a diagnosis of medial compartment osteoarthritis, left knee.  After appropriate laboratory studies were obtained  they were taken to the operating room on 06/28/2012 and underwent Procedure(s): UNICOMPARTMENTAL KNEE.   They were given perioperative antibiotics:  Anti-infectives     Start     Dose/Rate Route Frequency Ordered Stop   06/28/12 2000   vancomycin (VANCOCIN) IVPB 1000 mg/200 mL premix        1,000 mg 200 mL/hr over 60 Minutes Intravenous Every 12 hours 06/28/12 1321 06/28/12 2058   06/27/12  1500   vancomycin (VANCOCIN) IVPB 1000 mg/200 mL premix        1,000 mg 200 mL/hr over 60 Minutes Intravenous  Once 06/27/12 1456 06/28/12 0910        .  Tolerated the procedure well.  Placed with a foley intraoperatively.  Given Ofirmev at induction and for 48 hours.    POD #1, allowed out of bed to a chair.  PT for ambulation and exercise program.  Foley D/C'd in morning.  IV saline locked.  O2 discontionued.  POD #2, continued PT and ambulation.    .  The remainder of the hospital course was dedicated to ambulation and strengthening.   The patient was discharged on 2 Days Post-Op in  Good condition.  Blood products given:none  DIAGNOSTIC STUDIES: Recent vital signs: Patient Vitals for the past 24 hrs:  BP Temp Temp src Pulse Resp SpO2  06/30/12 0709 138/69 mmHg 98.5 F (36.9 C) Axillary 88  16  96 %  06/30/12 0608 125/57 mmHg 97.5 F (36.4 C) Axillary 77  16  99 %  06/30/12 0015 -  99.4 F (37.4 C) - - - -  July 29, 2012 2201 110/53 mmHg 99.2 F (37.3 C) Oral 70  16  96 %  07/29/2012 2130 120/60 mmHg - - - - -  2012-07-29 1600 - - - - 18  -  Jul 29, 2012 1400 83/43 mmHg 98.2 F (36.8 C) Oral 70  18  98 %       Recent laboratory studies:  Basename 06/30/12 0605 Jul 29, 2012 0530 06/28/12 1326  WBC 11.4* 14.0* 18.9*  HGB 11.1* 11.0* 12.9  HCT 34.3* 33.7* 39.4  PLT 235 255 289    Basename 06/30/12 0605 29-Jul-2012 0530 06/28/12 1326  NA 139 138 --  K 3.7 3.8 --  CL 103 103 --  CO2 28 27 --  BUN 16 14 --  CREATININE 0.70 0.70 0.55  GLUCOSE 95 96 --  CALCIUM 8.3* 8.1* --   Lab Results  Component Value Date   INR 1.08 06/18/2012   INR 0.96 01/15/2010   INR 1.0 07/05/2008     Recent Radiographic Studies :  Dg Chest 2 View  06/28/2012  *RADIOLOGY REPORT*  Clinical Data: Preop hypertension.  CHEST - 2 VIEW  Comparison: 09/08/2010  Findings: Heart and mediastinal contours are within normal limits. Biapical scarring.  Otherwise, no focal opacities or effusions.  No acute bony  abnormality.  IMPRESSION: No active cardiopulmonary disease.   Original Report Authenticated By: Charlett Nose, M.D.    Mm Digital Diagnostic Bilat  06/11/2012  *RADIOLOGY REPORT*  Clinical Data:  Annual mammogram; history of probably benign left upper outer quadrant posterior asymmetry for which 52-month diagnostic mammogram was suggested but for which the patient did not return  DIGITAL DIAGNOSTIC BILATERAL MAMMOGRAM WITH CAD  Comparison: 05/20/2011, 01/08/2009, 06/07/2007  Findings:  ACR Breast Density Category 2: There is a scattered fibroglandular pattern.  The right breast is unchanged and shows no suspicious abnormalities.  There is a biopsy marker clip in the subareolar right breast.  This is stable.  On the left, the area of asymmetry has resolved since the prior studies.  There are punctate and round scattered bilateral calcifications.  These are stable when compared to prior studies including 2008.  Mammographic images were processed with CAD.  IMPRESSION: No suspicious findings.  BI-RADS CATEGORY 2:  Benign finding(s).  RECOMMENDATION: Screening bilateral mammogram in 1 year.  I have discussed the findings and recommendations with the patient. Results were also provided in writing at the conclusion of the visit.   Original Report Authenticated By: Esperanza Heir, M.D.    Dg C-arm 1-60 Min-no Report  06/28/2012  CLINICAL DATA: left unicompartmental knee arthroplasty   C-ARM 1-60 MINUTES  Fluoroscopy was utilized by the requesting physician.  No radiographic  interpretation.      DISCHARGE INSTRUCTIONS: Discharge Orders    Future Orders Please Complete By Expires   Diet - low sodium heart healthy      Call MD / Call 911      Comments:   If you experience chest pain or shortness of breath, CALL 911 and be transported to the hospital emergency room.  If you develope a fever above 101 F, pus (white drainage) or increased drainage or redness at the wound, or calf pain, call your surgeon's office.     Constipation Prevention      Comments:   Drink plenty of fluids.  Prune juice may be helpful.  You may use a stool softener, such as Colace (over the counter) 100 mg twice a day.  Use MiraLax (  over the counter) for constipation as needed.   Increase activity slowly as tolerated      Driving restrictions      Comments:   No driving for 6 weeks   Lifting restrictions      Comments:   No lifting for 6 weeks   CPM      Comments:   Continuous passive motion machine (CPM):      Use the CPM from 0 to 90 for 6-8 hours per day.      You may increase by 10 per day.  You may break it up into 2 or 3 sessions per day.      Use CPM for 3 weeks or until you are told to stop.   TED hose      Comments:   Use stockings (TED hose) for 3 weeks on both leg(s).  You may remove them at night for sleeping.   Change dressing      Comments:   Change dressing on thursday, then change the dressing daily with sterile 4 x 4 inch gauze dressing and apply TED hose.   Do not put a pillow under the knee. Place it under the heel.         DISCHARGE MEDICATIONS:     Medication List     As of 06/30/2012 12:15 PM    STOP taking these medications         aspirin 81 MG EC tablet      oxyCODONE-acetaminophen 5-325 MG per tablet   Commonly known as: PERCOCET/ROXICET      TAKE these medications         acetaminophen 500 MG tablet   Commonly known as: TYLENOL   Take 1,000 mg by mouth 2 (two) times daily as needed. For pain      celecoxib 200 MG capsule   Commonly known as: CELEBREX   Take 1 capsule (200 mg total) by mouth every 12 (twelve) hours.      enoxaparin 40 MG/0.4ML injection   Commonly known as: LOVENOX   Inject 0.4 mLs (40 mg total) into the skin daily.      estradiol 1 MG tablet   Commonly known as: ESTRACE   Take 1 tablet (1 mg total) by mouth daily.      guaiFENesin 600 MG 12 hr tablet   Commonly known as: MUCINEX   Take 1,200 mg by mouth 2 (two) times daily as needed. For congestion       loratadine 10 MG tablet   Commonly known as: CLARITIN   Take 10 mg by mouth daily.      methocarbamol 500 MG tablet   Commonly known as: ROBAXIN   Take 1 tablet (500 mg total) by mouth every 6 (six) hours as needed.      metoCLOPramide 5 MG tablet   Commonly known as: REGLAN   Take 1-2 tablets (5-10 mg total) by mouth every 8 (eight) hours as needed (if ondansetron (ZOFRAN) ineffective.).      metoprolol 50 MG tablet   Commonly known as: LOPRESSOR   Take 1 tablet (50 mg total) by mouth 2 (two) times daily.      oxyCODONE 5 MG immediate release tablet   Commonly known as: Oxy IR/ROXICODONE   Take 1-2 tablets (5-10 mg total) by mouth every 4 (four) hours as needed.      OxyCODONE 10 mg T12a   Commonly known as: OXYCONTIN   Take 1 tablet (10 mg total) by mouth every  12 (twelve) hours.        FOLLOW UP VISIT:       Follow-up Information    Follow up with Raymon Mutton, MD. Call on 07/13/2012.   Contact information:   [address] [place] [address] [phone]          DISPOSITION:  home    CONDITION:  {Good  Melissa Jordan 06/30/2012, 12:15 PM

## 2012-11-18 ENCOUNTER — Other Ambulatory Visit: Payer: Self-pay | Admitting: Orthopedic Surgery

## 2012-11-18 ENCOUNTER — Encounter (HOSPITAL_COMMUNITY): Payer: Self-pay | Admitting: Pharmacy Technician

## 2012-11-23 ENCOUNTER — Other Ambulatory Visit: Payer: Self-pay | Admitting: Family Medicine

## 2012-11-23 ENCOUNTER — Encounter (HOSPITAL_COMMUNITY): Payer: Self-pay

## 2012-11-23 ENCOUNTER — Encounter (HOSPITAL_COMMUNITY)
Admission: RE | Admit: 2012-11-23 | Discharge: 2012-11-23 | Disposition: A | Discharge status: Home / Self Care | Payer: Medicare Other | Source: Ambulatory Visit | Attending: Orthopedic Surgery | Admitting: Orthopedic Surgery

## 2012-11-23 DIAGNOSIS — Z01810 Encounter for preprocedural cardiovascular examination: Secondary | ICD-10-CM | POA: Insufficient documentation

## 2012-11-23 DIAGNOSIS — Z01812 Encounter for preprocedural laboratory examination: Secondary | ICD-10-CM | POA: Insufficient documentation

## 2012-11-23 DIAGNOSIS — Z01818 Encounter for other preprocedural examination: Secondary | ICD-10-CM | POA: Insufficient documentation

## 2012-11-23 DIAGNOSIS — Z01811 Encounter for preprocedural respiratory examination: Secondary | ICD-10-CM | POA: Insufficient documentation

## 2012-11-23 HISTORY — DX: Personal history of other medical treatment: Z92.89

## 2012-11-23 HISTORY — DX: Myoneural disorder, unspecified: G70.9

## 2012-11-23 LAB — COMPREHENSIVE METABOLIC PANEL WITH GFR
ALT: 11 U/L (ref 0–35)
AST: 14 U/L (ref 0–37)
Albumin: 3.5 g/dL (ref 3.5–5.2)
Alkaline Phosphatase: 86 U/L (ref 39–117)
BUN: 15 mg/dL (ref 6–23)
CO2: 28 meq/L (ref 19–32)
Calcium: 8.8 mg/dL (ref 8.4–10.5)
Chloride: 101 meq/L (ref 96–112)
Creatinine, Ser: 0.65 mg/dL (ref 0.50–1.10)
GFR calc Af Amer: >90 mL/min
GFR calc non Af Amer: >90 mL/min
Glucose, Bld: 89 mg/dL (ref 70–99)
Potassium: 3.8 meq/L (ref 3.5–5.1)
Sodium: 137 meq/L (ref 135–145)
Total Bilirubin: 0.3 mg/dL (ref 0.3–1.2)
Total Protein: 6.5 g/dL (ref 6.0–8.3)

## 2012-11-23 LAB — URINE MICROSCOPIC-ADD ON

## 2012-11-23 LAB — SURGICAL PCR SCREEN
MRSA, PCR: NEGATIVE
Staphylococcus aureus: NEGATIVE

## 2012-11-23 LAB — CBC WITH DIFFERENTIAL/PLATELET
Basophils Absolute: 0 10*3/uL (ref 0.0–0.1)
Basophils Relative: 0 % (ref 0–1)
Eosinophils Absolute: 0.2 10*3/uL (ref 0.0–0.7)
Eosinophils Relative: 2 % (ref 0–5)
HCT: 40 % (ref 36.0–46.0)
Hemoglobin: 13.2 g/dL (ref 12.0–15.0)
Lymphocytes Relative: 23 % (ref 12–46)
Lymphs Abs: 2.2 10*3/uL (ref 0.7–4.0)
MCH: 29.3 pg (ref 26.0–34.0)
MCHC: 33 g/dL (ref 30.0–36.0)
MCV: 88.7 fL (ref 78.0–100.0)
Monocytes Absolute: 0.8 10*3/uL (ref 0.1–1.0)
Monocytes Relative: 8 % (ref 3–12)
Neutro Abs: 6.5 10*3/uL (ref 1.7–7.7)
Neutrophils Relative %: 67 % (ref 43–77)
Platelets: 269 10*3/uL (ref 150–400)
RBC: 4.51 MIL/uL (ref 3.87–5.11)
RDW: 13.4 % (ref 11.5–15.5)
WBC: 9.7 10*3/uL (ref 4.0–10.5)

## 2012-11-23 LAB — URINALYSIS, ROUTINE W REFLEX MICROSCOPIC
Bilirubin Urine: NEGATIVE
Glucose, UA: NEGATIVE mg/dL
Hgb urine dipstick: NEGATIVE
Ketones, ur: NEGATIVE mg/dL
Leukocytes, UA: NEGATIVE
Nitrite: POSITIVE — AB
Protein, ur: NEGATIVE mg/dL
Specific Gravity, Urine: 1.018 (ref 1.005–1.030)
Urobilinogen, UA: 0.2 mg/dL (ref 0.0–1.0)
pH: 7 (ref 5.0–8.0)

## 2012-11-23 LAB — PROTIME-INR
INR: 0.99 (ref 0.00–1.49)
Prothrombin Time: 13 seconds (ref 11.6–15.2)

## 2012-11-23 LAB — TYPE AND SCREEN
ABO/RH(D): O POS
Antibody Screen: NEGATIVE

## 2012-11-23 LAB — APTT: aPTT: 36 seconds (ref 24–37)

## 2012-11-23 NOTE — Pre-Procedure Instructions (Signed)
Melissa Jordan  11/23/2012   Your procedure is scheduled on:  11/29/2012  Report to [facility] at 8:00 AM.  Call this number if you have problems the morning of surgery: [phone]   Remember:   Do not eat food or drink liquids after midnight.  SUNDAY   Take these medicines the morning of surgery with A SIP OF WATER: metoprolol, tramadol, estradiol   Do not wear jewelry, make-up or nail polish.  Do not wear lotions, powders, or perfumes. You may wear deodorant.  Do not shave 48 hours prior to surgery.   Do not bring valuables to the hospital.  [facility] is not responsible                   for any belongings or valuables.  Contacts, dentures or bridgework may not be worn into surgery.  Leave suitcase in the car. After surgery it may be brought to your room.  For patients admitted to the hospital, checkout time is 11:00 AM the day of  discharge.   Patients discharged the day of surgery will not be allowed to drive  home.  Name and phone number of your driver: spouse  Special Instructions: Shower using CHG 2 nights before surgery and the night before surgery.  If you shower the day of surgery use CHG.  Use special wash - you have one bottle of CHG for all showers.  You should use approximately 1/3 of the bottle for each shower.   Please read over the following fact sheets that you were given: Pain Booklet, Coughing and Deep Breathing, Blood Transfusion Information, MRSA Information and Surgical Site Infection Prevention

## 2012-11-28 MED ORDER — TRANEXAMIC ACID 100 MG/ML IV SOLN
1000.0000 mg | INTRAVENOUS | Status: DC
  Filled 2012-11-28: qty 10

## 2012-11-28 MED ORDER — VANCOMYCIN HCL IN DEXTROSE 1-5 GM/200ML-% IV SOLN
1000.0000 mg | INTRAVENOUS | Status: AC
Start: 2012-11-29 — End: 2012-11-29
  Administered 2012-11-29: 1000 mg via INTRAVENOUS
  Filled 2012-11-28: qty 200

## 2012-11-29 ENCOUNTER — Ambulatory Visit (HOSPITAL_COMMUNITY): Payer: Medicare Other

## 2012-11-29 ENCOUNTER — Encounter (HOSPITAL_COMMUNITY): Payer: Self-pay | Admitting: *Deleted

## 2012-11-29 ENCOUNTER — Encounter (HOSPITAL_COMMUNITY): Payer: Self-pay | Admitting: Anesthesiology

## 2012-11-29 ENCOUNTER — Inpatient Hospital Stay (HOSPITAL_COMMUNITY)
Admission: RE | Admit: 2012-11-29 | Discharge: 2012-12-01 | DRG: 470 | Disposition: A | Discharge status: Home Health | Payer: Medicare Other | Source: Ambulatory Visit | Attending: Orthopedic Surgery | Admitting: Orthopedic Surgery

## 2012-11-29 ENCOUNTER — Encounter (HOSPITAL_COMMUNITY)
Admission: RE | Disposition: A | Discharge status: Home Health | Payer: Self-pay | Source: Ambulatory Visit | Attending: Orthopedic Surgery

## 2012-11-29 ENCOUNTER — Ambulatory Visit (HOSPITAL_COMMUNITY): Payer: Medicare Other | Admitting: Anesthesiology

## 2012-11-29 DIAGNOSIS — Z823 Family history of stroke: Secondary | ICD-10-CM

## 2012-11-29 DIAGNOSIS — Z8249 Family history of ischemic heart disease and other diseases of the circulatory system: Secondary | ICD-10-CM

## 2012-11-29 DIAGNOSIS — I10 Essential (primary) hypertension: Secondary | ICD-10-CM | POA: Diagnosis present

## 2012-11-29 DIAGNOSIS — M545 Low back pain, unspecified: Secondary | ICD-10-CM | POA: Diagnosis present

## 2012-11-29 DIAGNOSIS — Z9089 Acquired absence of other organs: Secondary | ICD-10-CM

## 2012-11-29 DIAGNOSIS — Z888 Allergy status to other drugs, medicaments and biological substances status: Secondary | ICD-10-CM

## 2012-11-29 DIAGNOSIS — Z6379 Other stressful life events affecting family and household: Secondary | ICD-10-CM

## 2012-11-29 DIAGNOSIS — K219 Gastro-esophageal reflux disease without esophagitis: Secondary | ICD-10-CM | POA: Diagnosis present

## 2012-11-29 DIAGNOSIS — Z79899 Other long term (current) drug therapy: Secondary | ICD-10-CM

## 2012-11-29 DIAGNOSIS — Z825 Family history of asthma and other chronic lower respiratory diseases: Secondary | ICD-10-CM

## 2012-11-29 DIAGNOSIS — Z7901 Long term (current) use of anticoagulants: Secondary | ICD-10-CM

## 2012-11-29 DIAGNOSIS — Z833 Family history of diabetes mellitus: Secondary | ICD-10-CM

## 2012-11-29 DIAGNOSIS — D62 Acute posthemorrhagic anemia: Secondary | ICD-10-CM | POA: Diagnosis not present

## 2012-11-29 DIAGNOSIS — Z780 Asymptomatic menopausal state: Secondary | ICD-10-CM

## 2012-11-29 DIAGNOSIS — M1711 Unilateral primary osteoarthritis, right knee: Secondary | ICD-10-CM

## 2012-11-29 DIAGNOSIS — G8929 Other chronic pain: Secondary | ICD-10-CM | POA: Diagnosis present

## 2012-11-29 DIAGNOSIS — M1710 Unilateral primary osteoarthritis, unspecified knee: Principal | ICD-10-CM | POA: Diagnosis present

## 2012-11-29 DIAGNOSIS — Z880 Allergy status to penicillin: Secondary | ICD-10-CM

## 2012-11-29 DIAGNOSIS — Z981 Arthrodesis status: Secondary | ICD-10-CM

## 2012-11-29 DIAGNOSIS — J45909 Unspecified asthma, uncomplicated: Secondary | ICD-10-CM | POA: Diagnosis present

## 2012-11-29 DIAGNOSIS — E039 Hypothyroidism, unspecified: Secondary | ICD-10-CM | POA: Diagnosis present

## 2012-11-29 HISTORY — PX: PARTIAL KNEE ARTHROPLASTY: SHX2174

## 2012-11-29 LAB — URINE MICROSCOPIC-ADD ON

## 2012-11-29 LAB — CBC
HCT: 42.4 % (ref 36.0–46.0)
Hemoglobin: 14.1 g/dL (ref 12.0–15.0)
MCH: 29.5 pg (ref 26.0–34.0)
MCHC: 33.3 g/dL (ref 30.0–36.0)
MCV: 88.7 fL (ref 78.0–100.0)
Platelets: 260 10*3/uL (ref 150–400)
RBC: 4.78 MIL/uL (ref 3.87–5.11)
RDW: 13.4 % (ref 11.5–15.5)
WBC: 18.8 10*3/uL — ABNORMAL HIGH (ref 4.0–10.5)

## 2012-11-29 LAB — URINALYSIS, ROUTINE W REFLEX MICROSCOPIC
Bilirubin Urine: NEGATIVE
Glucose, UA: NEGATIVE mg/dL
Ketones, ur: NEGATIVE mg/dL
Leukocytes, UA: NEGATIVE
Nitrite: NEGATIVE
Protein, ur: NEGATIVE mg/dL
Specific Gravity, Urine: 1.015 (ref 1.005–1.030)
Urobilinogen, UA: 0.2 mg/dL (ref 0.0–1.0)
pH: 7 (ref 5.0–8.0)

## 2012-11-29 LAB — CREATININE, SERUM
Creatinine, Ser: 0.63 mg/dL (ref 0.50–1.10)
GFR calc Af Amer: >90 mL/min (ref >90)
GFR calc non Af Amer: >90 mL/min (ref >90)

## 2012-11-29 SURGERY — ARTHROPLASTY, KNEE, UNICOMPARTMENTAL
Anesthesia: General | Site: Knee | Laterality: Right | Wound class: Clean

## 2012-11-29 MED ORDER — OXYCODONE HCL 5 MG PO TABS: 5.0000 mg | ORAL_TABLET | Freq: Once | ORAL | Status: DC | PRN

## 2012-11-29 MED ORDER — 0.9 % SODIUM CHLORIDE (POUR BTL) OPTIME
TOPICAL | Status: DC | PRN
  Administered 2012-11-29: 1000 mL

## 2012-11-29 MED ORDER — ONDANSETRON HCL 4 MG/2ML IJ SOLN
INTRAMUSCULAR | Status: DC | PRN
  Administered 2012-11-29: 4 mg via INTRAVENOUS

## 2012-11-29 MED ORDER — ACETAMINOPHEN 325 MG PO TABS: 650.0000 mg | ORAL_TABLET | Freq: Four times a day (QID) | ORAL | Status: DC | PRN

## 2012-11-29 MED ORDER — MENTHOL 3 MG MT LOZG: 1.0000 | LOZENGE | OROMUCOSAL | Status: DC | PRN

## 2012-11-29 MED ORDER — SODIUM CHLORIDE 0.9 % IV SOLN
INTRAVENOUS | Status: DC | PRN
  Administered 2012-11-29: 60 mL via INTRAMUSCULAR

## 2012-11-29 MED ORDER — LACTATED RINGERS IV SOLN
INTRAVENOUS | Status: DC
  Administered 2012-11-29 (×2): via INTRAVENOUS

## 2012-11-29 MED ORDER — PROMETHAZINE HCL 25 MG/ML IJ SOLN: 6.2500 mg | INTRAMUSCULAR | Status: DC | PRN

## 2012-11-29 MED ORDER — METOPROLOL TARTRATE 50 MG PO TABS
50.0000 mg | ORAL_TABLET | Freq: Every day | ORAL | Status: DC
  Administered 2012-11-30 – 2012-12-01 (×2): 50 mg via ORAL
  Filled 2012-11-29 (×3): qty 1

## 2012-11-29 MED ORDER — METOCLOPRAMIDE HCL 5 MG/ML IJ SOLN
5.0000 mg | Freq: Three times a day (TID) | INTRAMUSCULAR | Status: DC | PRN
  Administered 2012-11-29: 10 mg via INTRAVENOUS
  Filled 2012-11-29: qty 2

## 2012-11-29 MED ORDER — PHENOL 1.4 % MT LIQD: 1.0000 | OROMUCOSAL | Status: DC | PRN

## 2012-11-29 MED ORDER — HYDROMORPHONE HCL PF 1 MG/ML IJ SOLN
INTRAMUSCULAR | Status: AC
  Filled 2012-11-29: qty 1

## 2012-11-29 MED ORDER — FENTANYL CITRATE 0.05 MG/ML IJ SOLN
INTRAMUSCULAR | Status: DC | PRN
  Administered 2012-11-29: 100 ug via INTRAVENOUS
  Administered 2012-11-29 (×4): 50 ug via INTRAVENOUS

## 2012-11-29 MED ORDER — DOCUSATE SODIUM 100 MG PO CAPS
100.0000 mg | ORAL_CAPSULE | Freq: Two times a day (BID) | ORAL | Status: DC
  Administered 2012-11-29 – 2012-12-01 (×4): 100 mg via ORAL
  Filled 2012-11-29 (×5): qty 1

## 2012-11-29 MED ORDER — ESTRADIOL 1 MG PO TABS
1.0000 mg | ORAL_TABLET | Freq: Every day | ORAL | Status: DC
  Administered 2012-11-30 – 2012-12-01 (×2): 1 mg via ORAL
  Filled 2012-11-29 (×3): qty 1

## 2012-11-29 MED ORDER — ONDANSETRON HCL 4 MG PO TABS: 4.0000 mg | ORAL_TABLET | Freq: Four times a day (QID) | ORAL | Status: DC | PRN

## 2012-11-29 MED ORDER — BUPIVACAINE HCL (PF) 0.25 % IJ SOLN
INTRAMUSCULAR | Status: DC | PRN
  Administered 2012-11-29: 30 mL

## 2012-11-29 MED ORDER — ONDANSETRON HCL 4 MG/2ML IJ SOLN
4.0000 mg | Freq: Four times a day (QID) | INTRAMUSCULAR | Status: DC | PRN
  Administered 2012-11-30 (×2): 4 mg via INTRAVENOUS
  Filled 2012-11-29: qty 2

## 2012-11-29 MED ORDER — SODIUM CHLORIDE 0.9 % IR SOLN
Status: DC | PRN
  Administered 2012-11-29: 3000 mL

## 2012-11-29 MED ORDER — METHOCARBAMOL 500 MG PO TABS
500.0000 mg | ORAL_TABLET | Freq: Four times a day (QID) | ORAL | Status: DC | PRN
  Administered 2012-11-29 – 2012-12-01 (×6): 500 mg via ORAL
  Filled 2012-11-29 (×6): qty 1

## 2012-11-29 MED ORDER — FLUTICASONE PROPIONATE 50 MCG/ACT NA SUSP
2.0000 | Freq: Every day | NASAL | Status: DC
  Administered 2012-11-29 – 2012-12-01 (×3): 2 via NASAL
  Filled 2012-11-29: qty 16

## 2012-11-29 MED ORDER — ACETAMINOPHEN 650 MG RE SUPP: 650.0000 mg | Freq: Four times a day (QID) | RECTAL | Status: DC | PRN

## 2012-11-29 MED ORDER — SODIUM CHLORIDE 0.9 % IV SOLN: INTRAVENOUS | Status: DC

## 2012-11-29 MED ORDER — VANCOMYCIN HCL IN DEXTROSE 1-5 GM/200ML-% IV SOLN
1000.0000 mg | Freq: Two times a day (BID) | INTRAVENOUS | Status: AC
  Administered 2012-11-30: 1000 mg via INTRAVENOUS
  Filled 2012-11-29: qty 200

## 2012-11-29 MED ORDER — PROPOFOL 10 MG/ML IV BOLUS
INTRAVENOUS | Status: DC | PRN
  Administered 2012-11-29: 150 mg via INTRAVENOUS

## 2012-11-29 MED ORDER — DIPHENHYDRAMINE HCL 12.5 MG/5ML PO ELIX
12.5000 mg | ORAL_SOLUTION | ORAL | Status: DC | PRN
  Administered 2012-11-30 – 2012-12-01 (×4): 25 mg via ORAL
  Filled 2012-11-29 (×4): qty 10

## 2012-11-29 MED ORDER — ENOXAPARIN SODIUM 30 MG/0.3ML ~~LOC~~ SOLN
30.0000 mg | Freq: Two times a day (BID) | SUBCUTANEOUS | Status: DC
  Administered 2012-11-30 – 2012-12-01 (×3): 30 mg via SUBCUTANEOUS
  Filled 2012-11-29 (×5): qty 0.3

## 2012-11-29 MED ORDER — ESTRADIOL 1 MG PO TABS: 1.0000 mg | ORAL_TABLET | Freq: Every day | ORAL | Status: DC

## 2012-11-29 MED ORDER — LIDOCAINE HCL (CARDIAC) 10 MG/ML IV SOLN
INTRAVENOUS | Status: DC | PRN
  Administered 2012-11-29: 50 mg via INTRAVENOUS

## 2012-11-29 MED ORDER — CHLORHEXIDINE GLUCONATE 4 % EX LIQD: 60.0000 mL | Freq: Once | CUTANEOUS | Status: DC

## 2012-11-29 MED ORDER — ALUM & MAG HYDROXIDE-SIMETH 200-200-20 MG/5ML PO SUSP: 30.0000 mL | ORAL | Status: DC | PRN

## 2012-11-29 MED ORDER — SODIUM CHLORIDE 0.9 % IV SOLN
INTRAVENOUS | Status: DC
  Administered 2012-11-29: 17:00:00 via INTRAVENOUS

## 2012-11-29 MED ORDER — METHOCARBAMOL 100 MG/ML IJ SOLN
500.0000 mg | Freq: Four times a day (QID) | INTRAVENOUS | Status: DC | PRN
  Administered 2012-11-29: 500 mg via INTRAVENOUS
  Filled 2012-11-29: qty 5

## 2012-11-29 MED ORDER — LABETALOL HCL 5 MG/ML IV SOLN
INTRAVENOUS | Status: DC | PRN
  Administered 2012-11-29 (×2): 10 mg via INTRAVENOUS

## 2012-11-29 MED ORDER — BUPIVACAINE HCL (PF) 0.25 % IJ SOLN
INTRAMUSCULAR | Status: AC
  Filled 2012-11-29: qty 30

## 2012-11-29 MED ORDER — METOCLOPRAMIDE HCL 10 MG PO TABS: 5.0000 mg | ORAL_TABLET | Freq: Three times a day (TID) | ORAL | Status: DC | PRN

## 2012-11-29 MED ORDER — OXYCODONE HCL ER 10 MG PO T12A
10.0000 mg | EXTENDED_RELEASE_TABLET | Freq: Two times a day (BID) | ORAL | Status: DC
  Administered 2012-11-29 – 2012-12-01 (×4): 10 mg via ORAL
  Filled 2012-11-29 (×4): qty 1

## 2012-11-29 MED ORDER — HYDROMORPHONE HCL PF 1 MG/ML IJ SOLN
0.2500 mg | INTRAMUSCULAR | Status: DC | PRN
  Administered 2012-11-29 (×4): 0.5 mg via INTRAVENOUS

## 2012-11-29 MED ORDER — OXYCODONE HCL 5 MG PO TABS
5.0000 mg | ORAL_TABLET | ORAL | Status: DC | PRN
  Administered 2012-11-29 – 2012-12-01 (×12): 10 mg via ORAL
  Filled 2012-11-29: qty 2
  Filled 2012-11-29: qty 1
  Filled 2012-11-29 (×10): qty 2

## 2012-11-29 MED ORDER — OXYCODONE HCL 5 MG/5ML PO SOLN: 5.0000 mg | Freq: Once | ORAL | Status: DC | PRN

## 2012-11-29 MED ORDER — SCOPOLAMINE 1 MG/3DAYS TD PT72
MEDICATED_PATCH | TRANSDERMAL | Status: AC
  Administered 2012-11-29: 1.5 mg via TRANSDERMAL
  Filled 2012-11-29: qty 1

## 2012-11-29 MED ORDER — BUPIVACAINE LIPOSOME 1.3 % IJ SUSP
20.0000 mL | Freq: Once | INTRAMUSCULAR | Status: AC
  Administered 2012-11-29: 20 mL
  Filled 2012-11-29: qty 20

## 2012-11-29 MED ORDER — PROMETHAZINE HCL 25 MG/ML IJ SOLN
INTRAMUSCULAR | Status: AC
  Filled 2012-11-29: qty 1

## 2012-11-29 MED ORDER — DEXAMETHASONE SODIUM PHOSPHATE 4 MG/ML IJ SOLN
INTRAMUSCULAR | Status: DC | PRN
  Administered 2012-11-29: 8 mg via INTRAVENOUS

## 2012-11-29 MED ORDER — MIDAZOLAM HCL 5 MG/5ML IJ SOLN
INTRAMUSCULAR | Status: DC | PRN
  Administered 2012-11-29 (×2): 1 mg via INTRAVENOUS

## 2012-11-29 MED ORDER — ZOLPIDEM TARTRATE 5 MG PO TABS: 5.0000 mg | ORAL_TABLET | Freq: Every evening | ORAL | Status: DC | PRN

## 2012-11-29 MED ORDER — METHOCARBAMOL 500 MG PO TABS
ORAL_TABLET | ORAL | Status: AC
  Filled 2012-11-29: qty 1

## 2012-11-29 MED ORDER — SENNOSIDES-DOCUSATE SODIUM 8.6-50 MG PO TABS: 1.0000 | ORAL_TABLET | Freq: Every evening | ORAL | Status: DC | PRN

## 2012-11-29 MED ORDER — CELECOXIB 200 MG PO CAPS
200.0000 mg | ORAL_CAPSULE | Freq: Two times a day (BID) | ORAL | Status: DC
  Administered 2012-11-30 – 2012-12-01 (×4): 200 mg via ORAL
  Filled 2012-11-29 (×7): qty 1

## 2012-11-29 MED ORDER — BISACODYL 5 MG PO TBEC
5.0000 mg | DELAYED_RELEASE_TABLET | Freq: Every day | ORAL | Status: DC | PRN
  Administered 2012-11-30: 5 mg via ORAL
  Filled 2012-11-29: qty 1

## 2012-11-29 MED ORDER — SCOPOLAMINE 1 MG/3DAYS TD PT72: 1.0000 | MEDICATED_PATCH | TRANSDERMAL | Status: DC

## 2012-11-29 MED ORDER — HYDROMORPHONE HCL PF 1 MG/ML IJ SOLN
1.0000 mg | INTRAMUSCULAR | Status: DC | PRN
  Administered 2012-11-29 – 2012-12-01 (×15): 1 mg via INTRAVENOUS
  Filled 2012-11-29 (×15): qty 1

## 2012-11-29 MED ORDER — OXYCODONE HCL 5 MG PO TABS
ORAL_TABLET | ORAL | Status: AC
  Filled 2012-11-29: qty 2

## 2012-11-29 MED ORDER — ACETAMINOPHEN 10 MG/ML IV SOLN
INTRAVENOUS | Status: AC
  Filled 2012-11-29: qty 100

## 2012-11-29 MED ORDER — MIDAZOLAM HCL 2 MG/2ML IJ SOLN: 0.5000 mg | Freq: Once | INTRAMUSCULAR | Status: DC | PRN

## 2012-11-29 MED ORDER — CELECOXIB 200 MG PO CAPS
ORAL_CAPSULE | ORAL | Status: AC
  Filled 2012-11-29: qty 1

## 2012-11-29 MED ORDER — FLEET ENEMA 7-19 GM/118ML RE ENEM: 1.0000 | ENEMA | Freq: Once | RECTAL | Status: AC | PRN

## 2012-11-29 MED ORDER — MEPERIDINE HCL 25 MG/ML IJ SOLN: 6.2500 mg | INTRAMUSCULAR | Status: DC | PRN

## 2012-11-29 SURGICAL SUPPLY — 67 items
BANDAGE ESMARK 6X9 LF (GAUZE/BANDAGES/DRESSINGS) ×1 IMPLANT
BLADE SAW RECIP 87.9 MT (BLADE) ×2 IMPLANT
BLADE SAW SGTL 13X75X1.27 (BLADE) ×2 IMPLANT
BLADE SAW SGTL 83.5X18.5 (BLADE) ×2 IMPLANT
BNDG CMPR 9X6 STRL LF SNTH (GAUZE/BANDAGES/DRESSINGS) ×1
BNDG CMPR MED 10X6 ELC LF (GAUZE/BANDAGES/DRESSINGS) ×1
BNDG ELASTIC 6X10 VLCR STRL LF (GAUZE/BANDAGES/DRESSINGS) ×2 IMPLANT
BNDG ESMARK 6X9 LF (GAUZE/BANDAGES/DRESSINGS) ×2
BOWL SMART MIX CTS (DISPOSABLE) ×2 IMPLANT
CAP CEM FLX UNI FM/TB/SUR HD ×2 IMPLANT
CEMENT BONE SIMPLEX SPEEDSET (Cement) ×2 IMPLANT
CLOTH BEACON ORANGE TIMEOUT ST (SAFETY) ×2 IMPLANT
COVER BACK TABLE 24X17X13 BIG (DRAPES) IMPLANT
COVER SURGICAL LIGHT HANDLE (MISCELLANEOUS) ×3 IMPLANT
CUFF TOURNIQUET SINGLE 34IN LL (TOURNIQUET CUFF) ×2 IMPLANT
DRAPE C-ARM 42X72 X-RAY (DRAPES) ×2 IMPLANT
DRAPE EXTREMITY T 121X128X90 (DRAPE) ×2 IMPLANT
DRAPE INCISE IOBAN 66X45 STRL (DRAPES) ×5 IMPLANT
DRAPE PROXIMA HALF (DRAPES) ×2 IMPLANT
DRAPE U-SHAPE 47X51 STRL (DRAPES) ×2 IMPLANT
DRSG ADAPTIC 3X8 NADH LF (GAUZE/BANDAGES/DRESSINGS) ×2 IMPLANT
DRSG PAD ABDOMINAL 8X10 ST (GAUZE/BANDAGES/DRESSINGS) ×2 IMPLANT
DURAPREP 26ML APPLICATOR (WOUND CARE) ×4 IMPLANT
ELECT REM PT RETURN 9FT ADLT (ELECTROSURGICAL) ×2
ELECTRODE REM PT RTRN 9FT ADLT (ELECTROSURGICAL) ×1 IMPLANT
EVACUATOR 1/8 PVC DRAIN (DRAIN) ×1 IMPLANT
FLUID NSS /IRRIG 3000 ML XXX (IV SOLUTION) ×2 IMPLANT
GLOVE BIO SURGEON STRL SZ8.5 (GLOVE) ×1 IMPLANT
GLOVE BIOGEL M 7.0 STRL (GLOVE) ×2 IMPLANT
GLOVE BIOGEL PI IND STRL 7.5 (GLOVE) ×1 IMPLANT
GLOVE BIOGEL PI IND STRL 8.5 (GLOVE) ×2 IMPLANT
GLOVE BIOGEL PI INDICATOR 7.5 (GLOVE) ×1
GLOVE BIOGEL PI INDICATOR 8.5 (GLOVE) ×1
GLOVE SURG ORTHO 8.0 STRL STRW (GLOVE) ×4 IMPLANT
GLOVE SURG SS PI 8.5 STRL IVOR (GLOVE) ×3
GLOVE SURG SS PI 8.5 STRL STRW (GLOVE) ×3 IMPLANT
GOWN PREVENTION PLUS XLARGE (GOWN DISPOSABLE) ×6 IMPLANT
GOWN STRL NON-REIN LRG LVL3 (GOWN DISPOSABLE) ×2 IMPLANT
HANDPIECE INTERPULSE COAX TIP (DISPOSABLE) ×2
HEADED SCREW 33MM, DISPOSABLE, NOT IMPLANDED ×2 IMPLANT
HEADED SCREW,48MM DISPOSABLE, NOT IMPLANTED ×2 IMPLANT
HOOD PEEL AWAY FACE SHEILD DIS (HOOD) ×6 IMPLANT
KIT BASIN OR (CUSTOM PROCEDURE TRAY) ×2 IMPLANT
KIT ROOM TURNOVER OR (KITS) ×2 IMPLANT
MANIFOLD NEPTUNE II (INSTRUMENTS) ×2 IMPLANT
NEEDLE 22X1 1/2 (OR ONLY) (NEEDLE) ×4 IMPLANT
NS IRRIG 1000ML POUR BTL (IV SOLUTION) ×2 IMPLANT
PACK TOTAL JOINT (CUSTOM PROCEDURE TRAY) ×2 IMPLANT
PAD ARMBOARD 7.5X6 YLW CONV (MISCELLANEOUS) ×3 IMPLANT
PADDING CAST COTTON 6X4 STRL (CAST SUPPLIES) ×2 IMPLANT
SCREW 48MM (Screw) ×1 IMPLANT
SCREW HEADED 33MM (Screw) ×2 IMPLANT
SET HNDPC FAN SPRY TIP SCT (DISPOSABLE) ×1 IMPLANT
SPONGE GAUZE 4X4 12PLY (GAUZE/BANDAGES/DRESSINGS) ×2 IMPLANT
STAPLER VISISTAT 35W (STAPLE) ×2 IMPLANT
SUCTION FRAZIER TIP 10 FR DISP (SUCTIONS) ×2 IMPLANT
SUT VIC AB 0 CT1 27 (SUTURE) ×4
SUT VIC AB 0 CT1 27XBRD ANBCTR (SUTURE) ×2 IMPLANT
SUT VIC AB 1 CT1 27 (SUTURE) ×4
SUT VIC AB 1 CT1 27XBRD ANBCTR (SUTURE) ×1 IMPLANT
SUT VIC AB 2-0 CT1 27 (SUTURE) ×4
SUT VIC AB 2-0 CT1 TAPERPNT 27 (SUTURE) ×1 IMPLANT
SYR CONTROL 10ML LL (SYRINGE) ×2 IMPLANT
TOWEL OR 17X24 6PK STRL BLUE (TOWEL DISPOSABLE) ×2 IMPLANT
TOWEL OR 17X26 10 PK STRL BLUE (TOWEL DISPOSABLE) ×2 IMPLANT
TRAY FOLEY CATH 14FR (SET/KITS/TRAYS/PACK) ×1 IMPLANT
WATER STERILE IRR 1000ML POUR (IV SOLUTION) ×4 IMPLANT

## 2012-11-29 NOTE — Progress Notes (Signed)
Notified Dr. Sherlean Foot of pt's ua results from pre-admission appointment. New orders receive, sent another urine specimen to lab.
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
## 2012-11-29 NOTE — Progress Notes (Signed)
Orthopedic Tech Progress Note Patient Details:  Melissa Jordan [birth year] [id number] Applied CPM to RLE.  Left Footsie Roll with pt.'s nurse. CPM Right Knee CPM Right Knee: On Right Knee Flexion (Degrees): 90 Right Knee Extension (Degrees): 0   [name] 11/29/2012, 2:51 PM

## 2012-11-29 NOTE — Anesthesia Postprocedure Evaluation (Signed)
  Anesthesia Post-op Note  Patient: Melissa Jordan  Procedure(s) Performed: Procedure(s): UNICOMPARTMENTAL KNEE medial compartment (Right)  Patient Location: PACU  Anesthesia Type:General  Level of Consciousness: awake, alert , oriented and patient cooperative  Airway and Oxygen Therapy: Patient Spontanous Breathing and Patient connected to nasal cannula oxygen  Post-op Pain: none  Post-op Assessment: Post-op Vital signs reviewed, Patient's Cardiovascular Status Stable, Respiratory Function Stable, Patent Airway, No signs of Nausea or vomiting and Pain level controlled  Post-op Vital Signs: Reviewed and stable  Complications: No apparent anesthesia complications

## 2012-11-29 NOTE — Transfer of Care (Signed)
Immediate Anesthesia Transfer of Care Note  Patient: Melissa Jordan  Procedure(s) Performed: Procedure(s): UNICOMPARTMENTAL KNEE medial compartment (Right)  Patient Location: PACU  Anesthesia Type:General  Level of Consciousness: awake, alert  and oriented  Airway & Oxygen Therapy: Patient Spontanous Breathing and Patient connected to nasal cannula oxygen  Post-op Assessment: Report given to PACU RN, Post -op Vital signs reviewed and stable and Patient moving all extremities  Post vital signs: Reviewed and stable  Complications: No apparent anesthesia complications

## 2012-11-29 NOTE — Preoperative (Signed)
Beta Blockers   Reason not to administer Beta Blockers:Not Applicable, took this AM 

## 2012-11-29 NOTE — Anesthesia Preprocedure Evaluation (Signed)
Anesthesia Evaluation  Patient identified by MRN, date of birth, ID band Patient awake    Reviewed: Allergy & Precautions, H&P , NPO status , Patient's Chart, lab work & pertinent test results  History of Anesthesia Complications (+) PONV  Airway Mallampati: I TM Distance: >3 FB Neck ROM: Full    Dental  (+) Teeth Intact and Dental Advisory Given   Pulmonary neg pulmonary ROS, neg pneumonia -,  breath sounds clear to auscultation  Pulmonary exam normal       Cardiovascular hypertension, Pt. on medications and Pt. on home beta blockers - dysrhythmias Rhythm:Regular Rate:Normal     Neuro/Psych negative neurological ROS     GI/Hepatic Neg liver ROS, GERD-  Controlled,  Endo/Other  negative endocrine ROS  Renal/GU negative Renal ROS     Musculoskeletal   Abdominal   Peds  Hematology   Anesthesia Other Findings   Reproductive/Obstetrics                           Anesthesia Physical Anesthesia Plan  ASA: II  Anesthesia Plan: General   Post-op Pain Management:    Induction: Intravenous  Airway Management Planned: LMA  Additional Equipment:   Intra-op Plan:   Post-operative Plan:   Informed Consent: I have reviewed the patients History and Physical, chart, labs and discussed the procedure including the risks, benefits and alternatives for the proposed anesthesia with the patient or authorized representative who has indicated his/her understanding and acceptance.   Dental advisory given  Plan Discussed with: Surgeon and CRNA  Anesthesia Plan Comments: (Plan routine monitors, GA- LMA OK Patient relates her BP was problematic after her back surgery, not her prior knee replacement. She understands she will receive Exparel today, as per Dr. Sherlean Foot, in lieu of FNB, for post op analgesia)        Anesthesia Quick Evaluation

## 2012-11-30 ENCOUNTER — Encounter (HOSPITAL_COMMUNITY): Payer: Self-pay | Admitting: Orthopedic Surgery

## 2012-11-30 LAB — BASIC METABOLIC PANEL
BUN: 11 mg/dL (ref 6–23)
CO2: 26 mEq/L (ref 19–32)
Calcium: 8.1 mg/dL — ABNORMAL LOW (ref 8.4–10.5)
Chloride: 104 mEq/L (ref 96–112)
Creatinine, Ser: 0.66 mg/dL (ref 0.50–1.10)
GFR calc Af Amer: >90 mL/min (ref >90)
GFR calc non Af Amer: >90 mL/min (ref >90)
Glucose, Bld: 115 mg/dL — ABNORMAL HIGH (ref 70–99)
Potassium: 4.2 mEq/L (ref 3.5–5.1)
Sodium: 138 mEq/L (ref 135–145)

## 2012-11-30 LAB — CBC
HCT: 35.6 % — ABNORMAL LOW (ref 36.0–46.0)
Hemoglobin: 11.8 g/dL — ABNORMAL LOW (ref 12.0–15.0)
MCH: 29.6 pg (ref 26.0–34.0)
MCHC: 33.1 g/dL (ref 30.0–36.0)
MCV: 89.4 fL (ref 78.0–100.0)
Platelets: 239 10*3/uL (ref 150–400)
RBC: 3.98 MIL/uL (ref 3.87–5.11)
RDW: 13.6 % (ref 11.5–15.5)
WBC: 13.1 10*3/uL — ABNORMAL HIGH (ref 4.0–10.5)

## 2012-11-30 MED ORDER — METHOCARBAMOL 500 MG PO TABS: 500.0000 mg | ORAL_TABLET | Freq: Four times a day (QID) | ORAL | Status: DC | PRN

## 2012-11-30 MED ORDER — OXYCODONE HCL 5 MG PO TABS: 5.0000 mg | ORAL_TABLET | ORAL | Status: DC | PRN

## 2012-11-30 MED ORDER — ONDANSETRON HCL 4 MG/2ML IJ SOLN
INTRAMUSCULAR | Status: AC
  Filled 2012-11-30: qty 2

## 2012-11-30 MED ORDER — ENOXAPARIN SODIUM 40 MG/0.4ML ~~LOC~~ SOLN: 40.0000 mg | SUBCUTANEOUS | Status: DC

## 2012-11-30 MED ORDER — OXYCODONE HCL ER 10 MG PO T12A: 10.0000 mg | EXTENDED_RELEASE_TABLET | Freq: Two times a day (BID) | ORAL | Status: DC

## 2012-11-30 NOTE — Progress Notes (Signed)
SPORTS MEDICINE AND JOINT REPLACEMENT  Georgena Spurling, MD   Altamese Cabal, PA-C [address] [place], [place], [place]  [address]                             [phone]   PROGRESS NOTE  Subjective:  negative for Chest Pain  negative for Shortness of Breath  negative for Nausea/Vomiting   negative for Calf Pain  negative for Bowel Movement   Tolerating Diet: yes         Patient reports pain as 9 on 0-10 scale.    Objective: Vital signs in last 24 hours:   Patient Vitals for the past 24 hrs:  BP Temp Temp src Pulse Resp SpO2  11/30/12 0702 114/55 mmHg 98.4 F (36.9 C) Oral 72 16 100 %  11/30/12 0205 151/67 mmHg 98.2 F (36.8 C) Oral 77 16 100 %  11/29/12 2051 113/61 mmHg 97.9 F (36.6 C) Oral 71 15 99 %  11/29/12 1504 163/77 mmHg 98.5 F (36.9 C) - 77 16 95 %  11/29/12 1450 - - - - 12 -  11/29/12 1439 - - - 79 - 93 %  11/29/12 1400 177/94 mmHg 98.9 F (37.2 C) - 76 - 95 %    @flow {1959:LAST@   Intake/Output from previous day:   06/09 0701 - 06/10 0700 In: 2270 [P.O.:240; I.V.:2030] Out: 950 [Urine:950]   Intake/Output this shift:       Intake/Output     06/09 0701 - 06/10 0700 06/10 0701 - 06/11 0700   P.O. 240    I.V. 2030    Total Intake 2270     Urine 950    Total Output 950     Net +1320          Urine Occurrence 2 x       LABORATORY DATA:  Recent Labs  11/23/12 0952 11/29/12 1643 11/30/12 0510  WBC 9.7 18.8* 13.1*  HGB 13.2 14.1 11.8*  HCT 40.0 42.4 35.6*  PLT 269 260 239    Recent Labs  11/23/12 0952 11/29/12 1643 11/30/12 0510  NA 137  --  138  K 3.8  --  4.2  CL 101  --  104  CO2 28  --  26  BUN 15  --  11  CREATININE 0.65 0.63 0.66  GLUCOSE 89  --  115*  CALCIUM 8.8  --  8.1*   Lab Results  Component Value Date   INR 0.99 11/23/2012   INR 1.08 06/18/2012   INR 0.96 01/15/2010    Examination:  General appearance: alert, cooperative and no distress Extremities: Homans sign is negative, no sign of DVT  Wound Exam:  clean, dry, intact   Drainage:  None: wound tissue dry  Motor Exam: EHL and FHL Intact  Sensory Exam: Deep Peroneal normal   Assessment:    1 Day Post-Op  Procedure(s) (LRB): UNICOMPARTMENTAL KNEE medial compartment (Right)  ADDITIONAL DIAGNOSIS:  Active Problems:   * No active hospital problems. *  Acute Blood Loss Anemia   Plan: Physical Therapy as ordered Weight Bearing as Tolerated (WBAT)  DVT Prophylaxis:  Lovenox  DISCHARGE PLAN: Home  DISCHARGE NEEDS: HHPT, CPM, Walker and 3-in-1 comode seat         Audry Pecina 11/30/2012, 9:05 AM

## 2012-11-30 NOTE — Progress Notes (Signed)
Physical Therapy Note   11/30/12 1700  PT Visit Information  Last PT Received On 11/30/12  Assistance Needed +1  PT Time Calculation  PT Start Time 1522  PT Stop Time 1541  PT Time Calculation (min) 19 min  Subjective Data  Subjective Agreeable to OOB  Patient Stated Goal walk without pain  Precautions  Precautions Fall  Restrictions  Weight Bearing Restrictions Yes  RLE Weight Bearing WBAT  Cognition  Arousal/Alertness Awake/alert  Behavior During Therapy WFL for tasks assessed/performed  Overall Cognitive Status Within Functional Limits for tasks assessed  Bed Mobility  Bed Mobility Sit to Supine  Sit to Supine 4: Min assist  Details for Bed Mobility Assistance min assist for RLE  Transfers  Transfers Sit to Stand;Stand to Sit  Sit to Stand 4: Min assist;From bed  Stand to Sit 4: Min assist;To chair/3-in-1  Details for Transfer Assistance Cues for hand placement  Ambulation/Gait  Ambulation/Gait Assistance 4: Min guard  Ambulation Distance (Feet) 70 Feet  Assistive device Rolling walker  Ambulation/Gait Assistance Details Cues for R stance stability  Gait Pattern Step-to pattern  Exercises  Exercises Total Joint  Total Joint Exercises  Heel Slides (significant muscle guarding)  PT - End of Session  Equipment Utilized During Treatment Gait belt  Activity Tolerance Patient tolerated treatment well  Patient left in bed;in CPM;with call bell/phone within reach  Nurse Communication Mobility status  PT - Assessment/Plan  Comments on Treatment Session Making gains in activity tolerance; Will plan for stair training tomorrow  PT Plan Discharge plan remains appropriate  PT Frequency 7X/week  Follow Up Recommendations Home health PT;Supervision/Assistance - 24 hour  PT equipment Rolling walker with 5" wheels (3in1)  Acute Rehab PT Goals  PT Goal Formulation With patient  Time For Goal Achievement 12/07/12  Potential to Achieve Goals Good  Pt will go Supine/Side to Sit  with modified independence  PT Goal: Supine/Side to Sit - Progress Progressing toward goal  Pt will go Sit to Supine/Side with modified independence  PT Goal: Sit to Supine/Side - Progress Progressing toward goal  Pt will go Sit to Stand with modified independence  PT Goal: Sit to Stand - Progress Progressing toward goal  Pt will go Stand to Sit with modified independence  PT Goal: Stand to Sit - Progress Progressing toward goal  Pt will Ambulate >150 feet;with modified independence;with rolling walker  PT Goal: Ambulate - Progress Progressing toward goal  Pt will Go Up / Down Stairs Flight;with modified independence;with rail(s)  Pt will Perform Home Exercise Program Independently  PT General Charges  $$ ACUTE PT VISIT 1 Procedure  PT Treatments  $Gait Training 8-22 mins  [place], [place] [phone]

## 2012-11-30 NOTE — Progress Notes (Signed)
   CARE MANAGEMENT NOTE 11/30/2012  Patient:  Melissa Jordan, Melissa Jordan   Account Number:  [id number]  Date Initiated:  06/29/2012  Documentation initiated by:  Vance Peper  Subjective/Objective Assessment:   67 yr old female left total knee arthroplasty.     Action/Plan:   CM spoke witih patient concerning home health and DME needs at discharge. Patient preoperatively setup with [facility], no changes. DME to be delivered by [employer].   Anticipated DC Date:  06/30/2012   Anticipated DC Plan:  HOME W HOME HEALTH SERVICES      DC Planning Services  CM consult      [facility] Choice  HOME HEALTH   Choice offered to / List presented to:  C-1 Patient   DME arranged  3-N-1  WALKER - ROLLING  CPM      DME agency  [employer]     HH arranged  HH-2 PT      HH agency  [facility]   Status of service:  Completed, signed off Medicare Important Message given?   (If response is "NO", the following Medicare IM given date fields will be blank) Date Medicare IM given:   Date Additional Medicare IM given:    Discharge Disposition:  HOME W HOME HEALTH SERVICES  Per UR Regulation:    If discussed at Long Length of Stay Meetings, dates discussed:    Comments:  11/30/2012 1220 Pt has DME and CPM will be delivered once she arrives home. Isidoro Donning RN CCM Case Mgmt phone [phone]

## 2012-11-30 NOTE — Progress Notes (Signed)
OT Cancellation Note  Patient Details Name: Melissa Jordan MRN: [id number] DOB: 04-24-1946   Cancelled Treatment:    Reason Eval/Treat Not Completed: Other (comment)  Pt will have help at home 24/7 and has DME from previous orthopedic surgeries.  Screened for OT  [facility] 11/30/2012, 2:07 PM Marica Otter, OTR/L [phone] 11/30/2012

## 2012-11-30 NOTE — Progress Notes (Signed)
Physical Therapy Evaluation Patient Details Name: Melissa Jordan MRN: [id number] DOB: [birth year] Today's Date: 11/30/2012 Time: 5956-3875 PT Time Calculation (min): 24 min  PT Assessment / Plan / Recommendation Clinical Impression  67 yo female admitted for RTKA; presents with decr functional mobility; Will benefit from PT to adress mobility deficits and facilitate dc home    PT Assessment  Patient needs continued PT services    Follow Up Recommendations  Home health PT;Supervision/Assistance - 24 hour    Does the patient have the potential to tolerate intense rehabilitation      Barriers to Discharge        Equipment Recommendations  Rolling walker with 5" wheels (3in1)    Recommendations for Other Services     Frequency 7X/week    Precautions / Restrictions Precautions Precautions: Fall Restrictions Weight Bearing Restrictions: Yes RLE Weight Bearing: Weight bearing as tolerated   Pertinent Vitals/Pain 10/10 Pain with WBing; was premedicated      Mobility  Bed Mobility Bed Mobility: Supine to Sit;Sitting - Scoot to Edge of Bed Supine to Sit: 4: Min assist;With rails Sitting - Scoot to [place] of Bed: 4: Min assist;With rail Details for Bed Mobility Assistance: cues for technique and physical assist for RLE coming off of bed Transfers Transfers: Sit to Stand;Stand to Sit Sit to Stand: 4: Min assist;From bed Stand to Sit: 4: Min assist;To chair/3-in-1 Details for Transfer Assistance: Cues for hand placement Ambulation/Gait Ambulation/Gait Assistance: 4: Min assist Ambulation Distance (Feet): 12 Feet Assistive device: Rolling walker Ambulation/Gait Assistance Details: Cues for gait sequence and to increase step length; cues to activiate quad for R stance stability Gait Pattern: Step-to pattern    Exercises Total Joint Exercises Quad Sets: AROM;Right;10 reps;Supine Heel Slides: AAROM;Right;10 reps (significant muscle guarding)   PT Diagnosis: Difficulty  walking;Acute pain  PT Problem List: Decreased strength;Decreased range of motion;Decreased activity tolerance;Decreased balance;Decreased mobility;Decreased knowledge of use of DME;Decreased knowledge of precautions;Pain PT Treatment Interventions: DME instruction;Gait training;Stair training;Functional mobility training;Therapeutic activities;Therapeutic exercise;Patient/family education   PT Goals Acute Rehab PT Goals PT Goal Formulation: With patient Time For Goal Achievement: 12/07/12 Potential to Achieve Goals: Good Pt will go Supine/Side to Sit: with modified independence PT Goal: Supine/Side to Sit - Progress: Goal set today Pt will go Sit to Supine/Side: with modified independence PT Goal: Sit to Supine/Side - Progress: Goal set today Pt will go Sit to Stand: with modified independence PT Goal: Sit to Stand - Progress: Goal set today Pt will go Stand to Sit: with modified independence PT Goal: Stand to Sit - Progress: Goal set today Pt will Ambulate: >150 feet;with modified independence;with rolling walker PT Goal: Ambulate - Progress: Goal set today Pt will Go Up / Down Stairs: Flight;with modified independence;with rail(s) PT Goal: Up/Down Stairs - Progress: Goal set today Pt will Perform Home Exercise Program: Independently PT Goal: Perform Home Exercise Program - Progress: Goal set today  Visit Information  Last PT Received On: 11/30/12 Assistance Needed: +1    Subjective Data  Subjective: Agreeable to OOB Patient Stated Goal: walk without pain   Prior Functioning  Home Living Lives With: Spouse Available Help at Discharge: Family;Available 24 hours/day Type of Home: House Home Access: Stairs to enter [employer] of Steps: 12 Entrance Stairs-Rails: Right;Left Home Layout: One level Bathroom Shower/Tub: Tub/shower unit Home Adaptive Equipment: Walker - rolling;Bedside commode/3-in-1 Prior Function Level of Independence: Independent with assistive  device(s) Driving: Yes Comments: Anatomy and physiology/Chemistry teacher Communication Communication: No difficulties    Cognition  Cognition Arousal/Alertness: Awake/alert Behavior During Therapy: WFL for tasks assessed/performed Overall Cognitive Status: Within Functional Limits for tasks assessed    Extremity/Trunk Assessment Right Upper Extremity Assessment RUE ROM/Strength/Tone: Within functional levels Left Upper Extremity Assessment LUE ROM/Strength/Tone: Within functional levels Right Lower Extremity Assessment RLE ROM/Strength/Tone: Deficits;Due to pain RLE ROM/Strength/Tone Deficits: Decr AROM and strength, limited by pain postop Left Lower Extremity Assessment LLE ROM/Strength/Tone: Within functional levels Trunk Assessment Trunk Assessment: Normal   Balance    End of Session    GP     Olen Pel [place], [place] [phone]  11/30/2012, 10:42 AM

## 2012-11-30 NOTE — Progress Notes (Signed)
UR COMPLETED  

## 2012-12-01 LAB — CBC
HCT: 34.4 % — ABNORMAL LOW (ref 36.0–46.0)
Hemoglobin: 11.3 g/dL — ABNORMAL LOW (ref 12.0–15.0)
MCH: 29.9 pg (ref 26.0–34.0)
MCHC: 32.8 g/dL (ref 30.0–36.0)
MCV: 91 fL (ref 78.0–100.0)
Platelets: 212 10*3/uL (ref 150–400)
RBC: 3.78 MIL/uL — ABNORMAL LOW (ref 3.87–5.11)
RDW: 14.1 % (ref 11.5–15.5)
WBC: 8.6 10*3/uL (ref 4.0–10.5)

## 2012-12-01 LAB — URINE CULTURE: Colony Count: >=100000

## 2012-12-01 NOTE — Progress Notes (Signed)
Physical Therapy Note   12/01/12 1610  PT Visit Information  Last PT Received On 12/01/12  Assistance Needed +1  PT Time Calculation  PT Start Time 1535  PT Stop Time 1604  PT Time Calculation (min) 29 min  Subjective Data  Subjective Less  pain than this am; wanting to do steps  Patient Stated Goal walk without pain  Restrictions  RLE Weight Bearing WBAT  Cognition  Arousal/Alertness Awake/alert  Behavior During Therapy WFL for tasks assessed/performed  Overall Cognitive Status Within Functional Limits for tasks assessed  Transfers  Transfers Sit to Stand;Stand to Sit  Sit to Stand 4: Min guard;From bed  Stand to Sit 4: Min guard;To chair/3-in-1  Details for Transfer Assistance Cues for hand placement  Ambulation/Gait  Ambulation/Gait Assistance 5: Supervision  Ambulation Distance (Feet) 120 Feet  Assistive device Rolling walker  Ambulation/Gait Assistance Details Improved pain with WBing; No knee buckling noted  Gait Pattern Step-to pattern  Gait velocity slowed  Stairs Yes  Stairs Assistance 4: Min guard  Stair Management Technique One rail Right;Sideways  Number of Stairs 7 Cues for technique; very good job; stable on steps  Exercises  Exercises (Deffered as pt was in pain after stair training)  PT - End of Session  Equipment Utilized During Treatment Gait belt  Activity Tolerance Patient limited by pain  Patient left in chair;with call bell/phone within reach  Nurse Communication Mobility status  PT - Assessment/Plan  Comments on Treatment Session Good job with steps; Overall mobility adequate for dc home  PT Plan Discharge plan remains appropriate  PT Frequency 7X/week  Follow Up Recommendations Home health PT;Supervision/Assistance - 24 hour  PT equipment Rolling walker with 5" wheels  Acute Rehab PT Goals  Time For Goal Achievement 12/07/12  Potential to Achieve Goals Good  Pt will go Supine/Side to Sit with modified independence  Pt will go Sit to  Supine/Side with modified independence  Pt will go Sit to Stand with modified independence  PT Goal: Sit to Stand - Progress Progressing toward goal  Pt will go Stand to Sit with modified independence  PT Goal: Stand to Sit - Progress Progressing toward goal  Pt will Ambulate >150 feet;with modified independence;with rolling walker  PT Goal: Ambulate - Progress Progressing toward goal  Pt will Go Up / Down Stairs Flight;with modified independence;with rail(s)  PT Goal: Up/Down Stairs - Progress Progressing toward goal  PT General Charges  $$ ACUTE PT VISIT 1 Procedure  PT Treatments  $Gait Training 23-37 mins  7/10 Knee pain repositioned in chair  [place], [place] [phone]

## 2012-12-01 NOTE — Progress Notes (Signed)
Patient provided with discharge instructions and follow up information. She is going home with the support of her spouse and HHPT. She has no concerns at this time and is discharged home in stable condition.

## 2012-12-01 NOTE — Progress Notes (Signed)
SPORTS MEDICINE AND JOINT REPLACEMENT  Georgena Spurling, MD   Altamese Cabal, PA-C [address] [place], [place], [place]  [address]                             [phone]   PROGRESS NOTE  Subjective:  negative for Chest Pain  negative for Shortness of Breath  negative for Nausea/Vomiting   negative for Calf Pain  negative for Bowel Movement   Tolerating Diet: yes         Patient reports pain as 7 on 0-10 scale.    Objective: Vital signs in last 24 hours:   Patient Vitals for the past 24 hrs:  BP Temp Temp src Pulse Resp SpO2  12/01/12 0554 136/62 mmHg 98.1 F (36.7 C) - 80 18 94 %  11/30/12 2107 142/55 mmHg 98.2 F (36.8 C) - 73 18 96 %  11/30/12 1543 123/52 mmHg 99.4 F (37.4 C) Oral 68 20 100 %  11/30/12 1511 - - - - 18 -    @flow {1959:LAST@   Intake/Output from previous day:   06/10 0701 - 06/11 0700 In: 720 [P.O.:720] Out: -    Intake/Output this shift:       Intake/Output     06/10 0701 - 06/11 0700 06/11 0701 - 06/12 0700   P.O. 720    I.V.     Total Intake 720     Urine     Total Output       Net +720          Urine Occurrence 2 x       LABORATORY DATA:  Recent Labs  11/29/12 1643 11/30/12 0510 12/01/12 0450  WBC 18.8* 13.1* 8.6  HGB 14.1 11.8* 11.3*  HCT 42.4 35.6* 34.4*  PLT 260 239 212    Recent Labs  11/29/12 1643 11/30/12 0510  NA  --  138  K  --  4.2  CL  --  104  CO2  --  26  BUN  --  11  CREATININE 0.63 0.66  GLUCOSE  --  115*  CALCIUM  --  8.1*   Lab Results  Component Value Date   INR 0.99 11/23/2012   INR 1.08 06/18/2012   INR 0.96 01/15/2010    Examination:  General appearance: alert, cooperative and no distress Extremities: Homans sign is negative, no sign of DVT  Wound Exam: clean, dry, intact   Drainage:  None: wound tissue dry  Motor Exam: EHL and FHL Intact  Sensory Exam: Deep Peroneal normal   Assessment:    2 Days Post-Op  Procedure(s) (LRB): UNICOMPARTMENTAL KNEE medial compartment  (Right)  ADDITIONAL DIAGNOSIS:  Active Problems:   * No active hospital problems. *  Acute Blood Loss Anemia   Plan: Physical Therapy as ordered Weight Bearing as Tolerated (WBAT)  DVT Prophylaxis:  Lovenox  DISCHARGE PLAN: Home  DISCHARGE NEEDS: HHPT, CPM, Walker and 3-in-1 comode seat         Cristhian Vanhook 12/01/2012, 1:58 PM

## 2012-12-01 NOTE — Progress Notes (Signed)
Physical Therapy Treatment Patient Details Name: Melissa Jordan MRN: [id number] DOB: Dec 21, 1945 Today's Date: 12/01/2012 Time: 4098-1191 PT Time Calculation (min): 22 min  PT Assessment / Plan / Recommendation Comments on Treatment Session  Pt managed stairs quite well considering the amount of pain she was in; Feel once pain is controlled, dc today is still appropriate    Follow Up Recommendations  Home health PT;Supervision/Assistance - 24 hour     Does the patient have the potential to tolerate intense rehabilitation     Barriers to Discharge        Equipment Recommendations  Rolling walker with 5" wheels    Recommendations for Other Services    Frequency 7X/week   Plan Discharge plan remains appropriate    Precautions / Restrictions Restrictions Weight Bearing Restrictions: Yes RLE Weight Bearing: Weight bearing as tolerated   Pertinent Vitals/Pain 10+/10 pain R knee with WBing, pt became tearful; Applied ice and positioned pt as comfortably as possible    Mobility  Bed Mobility Bed Mobility: Supine to Sit Supine to Sit: 4: Min assist;With rails Sitting - Scoot to Edge of Bed: With rail;4: Min guard Details for Bed Mobility Assistance: min assist for RLE Transfers Transfers: Sit to Stand;Stand to Sit Sit to Stand: 4: Min guard;From bed Stand to Sit: 4: Min guard;To chair/3-in-1 Details for Transfer Assistance: Cues for hand placement Ambulation/Gait Ambulation/Gait Assistance: 4: Min guard Ambulation Distance (Feet): 20 Feet Assistive device: Rolling walker Ambulation/Gait Assistance Details: Very painful R knee with any WBing Gait Pattern: Step-to pattern Stairs: Yes Stairs Assistance: 4: Min guard Stair Management Technique: One rail Right;Sideways Number of Stairs: 6 Verbal and demo cues for sequence and technique;  Pt became tearful secondary to pain   Exercises     PT Diagnosis:    PT Problem List:   PT Treatment Interventions:     PT  Goals Acute Rehab PT Goals Time For Goal Achievement: 12/07/12 Potential to Achieve Goals: Good Pt will go Supine/Side to Sit: with modified independence PT Goal: Supine/Side to Sit - Progress: Progressing toward goal Pt will go Sit to Supine/Side: with modified independence Pt will go Sit to Stand: with modified independence PT Goal: Sit to Stand - Progress: Progressing toward goal Pt will go Stand to Sit: with modified independence PT Goal: Stand to Sit - Progress: Progressing toward goal Pt will Ambulate: >150 feet;with modified independence;with rolling walker PT Goal: Ambulate - Progress: Not progressing Pt will Go Up / Down Stairs: Flight;with modified independence;with rail(s) PT Goal: Up/Down Stairs - Progress: Progressing toward goal  Visit Information  Last PT Received On: 12/01/12 Assistance Needed: +1    Subjective Data  Subjective: In a lot of pain this am; but wanting to work Patient Stated Goal: walk without pain   Cognition  Cognition Arousal/Alertness: Awake/alert Behavior During Therapy: WFL for tasks assessed/performed Overall Cognitive Status: Within Functional Limits for tasks assessed    Balance     End of Session PT - End of Session Equipment Utilized During Treatment: Gait belt Activity Tolerance: Patient limited by pain Patient left: in chair;with call bell/phone within reach Nurse Communication: Mobility status CPM Right Knee CPM Right Knee: Off Right Knee Flexion (Degrees): 60 Right Knee Extension (Degrees): 0   GP     Van Clines [place], [place] [phone]  12/01/2012, 12:45 PM

## 2012-12-01 NOTE — Discharge Summary (Signed)
SPORTS MEDICINE & JOINT REPLACEMENT   Georgena Spurling, MD   Altamese Cabal, PA-C [address] [place], [place], [place]  [address]                             [phone]  PATIENT ID: Melissa Jordan        MRN:  [id number]          DOB/AGE: 67/17/1947 / 67 y.o.    DISCHARGE SUMMARY  ADMISSION DATE:    11/29/2012 DISCHARGE DATE:   12/01/2012   ADMISSION DIAGNOSIS: osteoarthritis medial compartment right knee    DISCHARGE DIAGNOSIS:  osteoarthritis medial compartment right knee    ADDITIONAL DIAGNOSIS: Active Problems:   * No active hospital problems. *  Past Medical History  Diagnosis Date  . Allergy   . Arrhythmia     takes Metoprolol daily  . History of bronchitis     "when I get a bad cold; not chronic; I've had it a few times" (06/28/2012)  . Joint pain   . Joint swelling   . Osteopenia   . Bruises easily   . Seasonal allergies     takes Claritin daily  . Urinary urgency   . Complication of anesthesia     "BP bottoms out after OR" (06/28/2012)  . PONV (postoperative nausea and vomiting)   . Asthma   . GERD (gastroesophageal reflux disease)     "one time; really I think it was all due to drinking aspartame" (06/28/2012)  . Chronic lower back pain   . History of stress test     30 yrs. ago- wnl  . Pneumonia     "couple times in the winters" (06/28/2012), hosp. 2002  . Neuromuscular disorder     back related   . Osteoarthritis     back, knees    PROCEDURE: Procedure(s): UNICOMPARTMENTAL KNEE medial compartment on 11/29/2012  CONSULTS:     HISTORY:  See H&P in chart  HOSPITAL COURSE:  Melissa Jordan is a 67 y.o. admitted on 11/29/2012 and found to have a diagnosis of osteoarthritis medial compartment right knee.  After appropriate laboratory studies were obtained  they were taken to the operating room on 11/29/2012 and underwent Procedure(s): UNICOMPARTMENTAL KNEE medial compartment.   They were given perioperative antibiotics:  Anti-infectives   Start     Dose/Rate  Route Frequency Ordered Stop   11/29/12 2330  vancomycin (VANCOCIN) IVPB 1000 mg/200 mL premix     1,000 mg 200 mL/hr over 60 Minutes Intravenous Every 12 hours 11/29/12 1638 11/30/12 0102   11/29/12 0600  vancomycin (VANCOCIN) IVPB 1000 mg/200 mL premix     1,000 mg 200 mL/hr over 60 Minutes Intravenous On call to O.R. 11/28/12 1348 11/29/12 1147    .  Tolerated the procedure well.  Placed with a foley intraoperatively.  Given Ofirmev at induction and for 48 hours.    POD# 1: Vital signs were stable.  Patient denied Chest pain, shortness of breath, or calf pain.  Patient was started on Lovenox 30 mg subcutaneously twice daily at 8am.  Consults to PT, OT, and care management were made.  The patient was weight bearing as tolerated.  CPM was placed on the operative leg 0-90 degrees for 6-8 hours a day.  Incentive spirometry was taught.  Dressing was changed.  Marcaine pump and hemovac were discontinued.      POD #2, Continued  PT for ambulation and exercise program.  IV saline locked.  O2 discontinued.    The remainder of the hospital course was dedicated to ambulation and strengthening.   The patient was discharged on 2 Days Post-Op in  Good condition.  Blood products given:none  DIAGNOSTIC STUDIES: Recent vital signs: Patient Vitals for the past 24 hrs:  BP Temp Temp src Pulse Resp SpO2  12/01/12 0554 136/62 mmHg 98.1 F (36.7 C) - 80 18 94 %  11/30/12 2107 142/55 mmHg 98.2 F (36.8 C) - 73 18 96 %  11/30/12 1543 123/52 mmHg 99.4 F (37.4 C) Oral 68 20 100 %  11/30/12 1511 - - - - 18 -       Recent laboratory studies:  Recent Labs  11/29/12 1643 11/30/12 0510 12/01/12 0450  WBC 18.8* 13.1* 8.6  HGB 14.1 11.8* 11.3*  HCT 42.4 35.6* 34.4*  PLT 260 239 212    Recent Labs  11/29/12 1643 11/30/12 0510  NA  --  138  K  --  4.2  CL  --  104  CO2  --  26  BUN  --  11  CREATININE 0.63 0.66  GLUCOSE  --  115*  CALCIUM  --  8.1*   Lab Results  Component Value Date    INR 0.99 11/23/2012   INR 1.08 06/18/2012   INR 0.96 01/15/2010     Recent Radiographic Studies :  Dg Chest 2 View  11/23/2012   *RADIOLOGY REPORT*  Clinical Data: Known heart arrhythmia, preoperative evaluation for knee surgery  CHEST - 2 VIEW  Comparison: 06/28/2012  Findings: The heart and pulmonary vascularity are within normal limits.  The lungs are clear bilaterally but mildly hyperinflated. No bony abnormality is seen.  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   Original Report Authenticated By: Alcide Clever, M.D.   Dg Knee 1-2 Views Right  11/29/2012   *RADIOLOGY REPORT*  Clinical Data: Right knee replacement  RIGHT KNEE - 1-2 VIEW  Comparison: None.  Findings: Four spot films were obtained intraoperatively and reveal surgical instruments surrounding the right tibia and fibula   Original Report Authenticated By: Alcide Clever, M.D.    DISCHARGE INSTRUCTIONS: Discharge Orders   Future Orders Complete By Expires     CPM  As directed     Comments:      Continuous passive motion machine (CPM):      Use the CPM from 0 to 90 for 6-8 hours per day.      You may increase by 10 per day.  You may break it up into 2 or 3 sessions per day.      Use CPM for 2 weeks or until you are told to stop.    Call MD / Call 911  As directed     Comments:      If you experience chest pain or shortness of breath, CALL 911 and be transported to the hospital emergency room.  If you develope a fever above 101 F, pus (white drainage) or increased drainage or redness at the wound, or calf pain, call your surgeon's office.    Change dressing  As directed     Comments:      Change dressing on thursday, then change the dressing daily with sterile 4 x 4 inch gauze dressing and apply TED hose.    Constipation Prevention  As directed     Comments:      Drink plenty of fluids.  Prune juice may be helpful.  You may use a stool  softener, such as Colace (over the counter) 100 mg twice a day.  Use MiraLax (over the  counter) for constipation as needed.    Diet - low sodium heart healthy  As directed     Do not put a pillow under the knee. Place it under the heel.  As directed     Driving restrictions  As directed     Comments:      No driving for 6 weeks    Increase activity slowly as tolerated  As directed     Lifting restrictions  As directed     Comments:      No lifting for 6 weeks    TED hose  As directed     Comments:      Use stockings (TED hose) for 3 weeks on both leg(s).  You may remove them at night for sleeping.       DISCHARGE MEDICATIONS:     Medication List    STOP taking these medications       traMADol 50 MG tablet  Commonly known as:  ULTRAM      TAKE these medications       acetaminophen 500 MG tablet  Commonly known as:  TYLENOL  Take 1,000 mg by mouth 2 (two) times daily as needed. For pain     BIOTIN PO  Take 1 capsule by mouth daily.     enoxaparin 40 MG/0.4ML injection  Commonly known as:  LOVENOX  Inject 0.4 mLs (40 mg total) into the skin daily.     estradiol 1 MG tablet  Commonly known as:  ESTRACE  Take 1 mg by mouth daily with breakfast.     estradiol 1 MG tablet  Commonly known as:  ESTRACE  TAKE ONE TABLET BY MOUTH EVERY DAY     guaiFENesin 600 MG 12 hr tablet  Commonly known as:  MUCINEX  Take 1,200 mg by mouth 2 (two) times daily as needed. For congestion     methocarbamol 500 MG tablet  Commonly known as:  ROBAXIN  Take 1 tablet (500 mg total) by mouth every 6 (six) hours as needed.     metoprolol 50 MG tablet  Commonly known as:  LOPRESSOR  Take 50 mg by mouth daily with breakfast.     naproxen sodium 220 MG tablet  Commonly known as:  ANAPROX  Take 440 mg by mouth 2 (two) times daily with a meal.     oxyCODONE 5 MG immediate release tablet  Commonly known as:  Oxy IR/ROXICODONE  Take 1-2 tablets (5-10 mg total) by mouth every 3 (three) hours as needed.     OxyCODONE 10 mg T12a  Commonly known as:  OXYCONTIN  Take 1 tablet  (10 mg total) by mouth every 12 (twelve) hours.     triamcinolone 55 MCG/ACT nasal inhaler  Commonly known as:  NASACORT  Place 2 sprays into the nose daily.        FOLLOW UP VISIT:       Follow-up Information   Follow up with Raymon Mutton, MD. Call on 12/14/2012.   Contact information:   [address] [place] [address] [phone]       Follow up with [facility]. (Home Health Physical Therapy)    Contact information:   [phone]      DISPOSITION: HOME   CONDITION:  Good   Quitman Norberto 12/01/2012, 1:59 PM

## 2012-12-01 NOTE — Op Note (Signed)
TOTAL KNEE REPLACEMENT OPERATIVE NOTE:  11/29/2012  7:28 PM  PATIENT:  Melissa Jordan  67 y.o. female  PRE-OPERATIVE DIAGNOSIS:  osteoarthritis medial compartment right knee  POST-OPERATIVE DIAGNOSIS:  osteoarthritis medial compartment right knee  PROCEDURE:  Procedure(s): UNICOMPARTMENTAL KNEE medial compartment  SURGEON:  Surgeon(s): Dannielle Huh, MD  PHYSICIAN ASSISTANT: Altamese Cabal, [facility]  ANESTHESIA:   general  DRAINS: Hemovac and On-Q Marcaine Pain Pump  SPECIMEN: None  COUNTS:  Correct  TOURNIQUET:   Total Tourniquet Time Documented: Thigh (Right) - 78 minutes Total: Thigh (Right) - 78 minutes   DICTATION:  Indication for procedure:    The patient is a 67 y.o. female who has failed conservative treatment for osteoarthritis medial compartment right knee.  Informed consent was obtained prior to anesthesia. The risks versus benefits of the operation were explain and in a way the patient can, and did, understand.   Description of procedure:     The patient was taken to the operating room and placed under anesthesia.  The patient was positioned in the usual fashion taking care that all body parts were adequately padded and/or protected.  I foley catheter was not placed.  A tourniquet was applied and the leg prepped and draped in the usual sterile fashion.  The extremity was exsanguinated with the esmarch and tourniquet inflated to 350 mmHg.  Pre-operative range of motion was normal.  The knee was in 3 degree of mild varus.  A midline incision approximately 6-7 inches long was made with a #10 blade.  A new blade was used to make a parapatellar arthrotomy going 2-3 cm into the quadriceps tendon, over the patella, and alongside the medial aspect of the patellar tendon.  A synovectomy was then performed with the #10 blade and forceps. I then elevated the deep MCL off the medial tibial metaphysis subperiosteally around to the semimembranosus attachment.    I everted the  patella and used calipers to measure patellar thickness.  I used the reamer to ream down to appropriate thickness to recreate the native thickness.  I then removed excess bone with the rongeur and sagittal saw.  I used the appropriately sized template and drilled the three lug holes.  I then put the trial in place and measured the thickness with the calipers to ensure recreation of the native thickness.  The trial was then removed and the patella subluxed and the knee brought into flexion.  A homan retractor was place to retract and protect the patella and lateral structures.  A Z-retractor was place medially to protect the medial structures.  The extra-medullary alignment system was used to make cut the tibial articular surface perpendicular to the anamotic axis of the tibia and in 3 degrees of posterior slope.  The cut surface and alignment jig was removed.  I then used the intramedullary alignment guide to make a 6 valgus cut on the distal femur.  I then marked out the epicondylar axis on the distal femur.  The posterior condylar axis measured 3 degrees.  I then used the anterior referencing sizer and measured the femur to be a size E.  The 4-In-1 cutting block was screwed into place in external rotation matching the posterior condylar angle, making our cuts perpendicular to the epicondylar axis.  Anterior, posterior and chamfer cuts were made with the sagittal saw.  The cutting block and cut pieces were removed.  A lamina spreader was placed in 90 degrees of flexion.  The ACL, PCL, menisci, and posterior condylar osteophytes  were removed.  A 10 mm spacer blocked was found to offer good flexion and extension gap balance after minimal in degree releasing.   The scoop retractor was then placed and the femoral finishing block was pinned in place.  The small sagittal saw was used as well as the lug drill to finish the femur.  The block and cut surfaces were removed and the medullary canal hole filled with  autograft bone from the cut pieces.  The tibia was delivered forward in deep flexion and external rotation.  A size 6 tray was selected and pinned into place centered on the medial 1/3 of the tibial tubercle.  The reamer and keel was used to prepare the tibia through the tray.    I then trialed with the size E femur, size 6 tibia, a 10 mm insert and the 35 patella.  I had excellent flexion/extension gap balance, excellent patella tracking.  Flexion was full and beyond 120 degrees; extension was zero.  These components were chosen and the staff opened them to me on the back table while the knee was lavaged copiously and the cement mixed.  I cemented in the components and removed all excess cement.  The polyethylene tibial component was snapped into place and the knee placed in extension while cement was hardening.  The capsule was infilltrated with 20cc of .25% Marcaine with epinephrine.  A hemovac was place in the joint exiting superolaterally.  A pain pump was place superomedially superficial to the arthrotomy.  Once the cement was hard, the tourniquet was let down.  Hemostasis was obtained.  The arthrotomy was closed with figure-8 #1 vicryl sutures.  The deep soft tissues were closed with #0 vicryls and the subcuticular layer closed with a running #2-0 vicryl.  The skin was reapproximated and closed with skin staples.  The wound was dressed with xeroform, 4 x4's, 2 ABD sponges, a single layer of webril and a TED stocking.   The patient was then awakened, extubated, and taken to the recovery room in stable condition.  BLOOD LOSS:  300cc DRAINS: 1 hemovac, 1 pain catheter COMPLICATIONS:  None.  PLAN OF CARE: Admit to inpatient   PATIENT DISPOSITION:  PACU - hemodynamically stable.   Delay start of Pharmacological VTE agent (>24hrs) due to surgical blood loss or risk of bleeding:  not applicable  Please fax a copy of this op note to my office at [phone] (please only include page 1 and 2 of the  Case Information op note)

## 2012-12-25 ENCOUNTER — Other Ambulatory Visit: Payer: Self-pay | Admitting: Family Medicine

## 2013-01-25 ENCOUNTER — Other Ambulatory Visit: Payer: Self-pay | Admitting: Family Medicine

## 2013-01-26 NOTE — Telephone Encounter (Signed)
Rx sent to the pharmacy by e-script alone with a note that the pt is due an CPE.   Mailed pt a letter informing her that she is due an CPE and to please call the office to schedule an appt.//AB/CMA

## 2013-03-30 ENCOUNTER — Other Ambulatory Visit: Payer: Self-pay | Admitting: Family Medicine

## 2013-03-30 NOTE — Telephone Encounter (Signed)
Med filled with 1 refill due to CPE in Dec.

## 2013-04-26 ENCOUNTER — Other Ambulatory Visit: Payer: Self-pay | Admitting: Family Medicine

## 2013-04-26 NOTE — Telephone Encounter (Signed)
Med filled, CPE in Dec.

## 2013-05-23 ENCOUNTER — Telehealth: Payer: Self-pay

## 2013-05-23 NOTE — Telephone Encounter (Signed)
Medication and allergies: reviewed and updated  90 day supply/mail order: na Local pharmacy: [facility] on [address]   Immunizations due:  Admin flu vaccine upon arrival  A/P:   No changes to FH or PSH Pap--gyn [facility]--05/2012 Bone Density--04/2011 CCS--06/2005 per patient PNA--thinks that she had one because of several occurences of pneumonia--3-4 years ago Shingles--doesn't think she has had   To Discuss with Provider: Knee surgeries went well

## 2013-05-24 ENCOUNTER — Ambulatory Visit (INDEPENDENT_AMBULATORY_CARE_PROVIDER_SITE_OTHER): Payer: Medicare Other | Admitting: Family Medicine

## 2013-05-24 ENCOUNTER — Encounter: Payer: Self-pay | Admitting: Family Medicine

## 2013-05-24 ENCOUNTER — Other Ambulatory Visit: Payer: Self-pay | Admitting: General Practice

## 2013-05-24 VITALS — BP 132/78 | HR 74 | Temp 98.6°F | Resp 16 | Ht 63.5 in | Wt 155.4 lb

## 2013-05-24 DIAGNOSIS — E785 Hyperlipidemia, unspecified: Secondary | ICD-10-CM

## 2013-05-24 DIAGNOSIS — Z0000 Encounter for general adult medical examination without abnormal findings: Secondary | ICD-10-CM

## 2013-05-24 DIAGNOSIS — Z1231 Encounter for screening mammogram for malignant neoplasm of breast: Secondary | ICD-10-CM

## 2013-05-24 DIAGNOSIS — Z23 Encounter for immunization: Secondary | ICD-10-CM

## 2013-05-24 DIAGNOSIS — E2839 Other primary ovarian failure: Secondary | ICD-10-CM

## 2013-05-24 DIAGNOSIS — I10 Essential (primary) hypertension: Secondary | ICD-10-CM

## 2013-05-24 DIAGNOSIS — E039 Hypothyroidism, unspecified: Secondary | ICD-10-CM

## 2013-05-24 LAB — HEPATIC FUNCTION PANEL
ALT: 13 U/L (ref 0–35)
AST: 17 U/L (ref 0–37)
Albumin: 3.9 g/dL (ref 3.5–5.2)
Alkaline Phosphatase: 72 U/L (ref 39–117)
Bilirubin, Direct: 0 mg/dL (ref 0.0–0.3)
Total Bilirubin: 0.6 mg/dL (ref 0.3–1.2)
Total Protein: 6.5 g/dL (ref 6.0–8.3)

## 2013-05-24 LAB — CBC WITH DIFFERENTIAL/PLATELET
Basophils Absolute: 0 10*3/uL (ref 0.0–0.1)
Basophils Relative: 0.3 % (ref 0.0–3.0)
Eosinophils Absolute: 0.2 10*3/uL (ref 0.0–0.7)
Eosinophils Relative: 2.6 % (ref 0.0–5.0)
HCT: 38.9 % (ref 36.0–46.0)
Hemoglobin: 13.2 g/dL (ref 12.0–15.0)
Lymphocytes Relative: 24.4 % (ref 12.0–46.0)
Lymphs Abs: 1.8 10*3/uL (ref 0.7–4.0)
MCHC: 34 g/dL (ref 30.0–36.0)
MCV: 87.2 fl (ref 78.0–100.0)
Monocytes Absolute: 0.5 10*3/uL (ref 0.1–1.0)
Monocytes Relative: 7.5 % (ref 3.0–12.0)
Neutro Abs: 4.8 10*3/uL (ref 1.4–7.7)
Neutrophils Relative %: 65.2 % (ref 43.0–77.0)
Platelets: 209 10*3/uL (ref 150.0–400.0)
RBC: 4.46 Mil/uL (ref 3.87–5.11)
RDW: 13.6 % (ref 11.5–14.6)
WBC: 7.4 10*3/uL (ref 4.5–10.5)

## 2013-05-24 LAB — BASIC METABOLIC PANEL
BUN: 17 mg/dL (ref 6–23)
CO2: 25 mEq/L (ref 19–32)
Calcium: 8.6 mg/dL (ref 8.4–10.5)
Chloride: 105 mEq/L (ref 96–112)
Creatinine, Ser: 0.6 mg/dL (ref 0.4–1.2)
GFR: 103.87 mL/min (ref >60.00)
Glucose, Bld: 90 mg/dL (ref 70–99)
Potassium: 4 mEq/L (ref 3.5–5.1)
Sodium: 138 mEq/L (ref 135–145)

## 2013-05-24 LAB — LIPID PANEL
Cholesterol: 195 mg/dL (ref 0–200)
HDL: 55.7 mg/dL (ref >39.00)
LDL Cholesterol: 113 mg/dL — ABNORMAL HIGH (ref 0–99)
Total CHOL/HDL Ratio: 4
Triglycerides: 134 mg/dL (ref 0.0–149.0)
VLDL: 26.8 mg/dL (ref 0.0–40.0)

## 2013-05-24 LAB — TSH: TSH: 1.59 u[IU]/mL (ref 0.35–5.50)

## 2013-05-24 MED ORDER — ESTRADIOL 1 MG PO TABS: ORAL_TABLET | ORAL | Status: DC

## 2013-05-24 MED ORDER — METOPROLOL TARTRATE 50 MG PO TABS: ORAL_TABLET | ORAL | Status: DC

## 2013-05-24 NOTE — Assessment & Plan Note (Signed)
Chronic problem.  Not currently on meds as recent labs have been WNL.  Repeat labs and determine whether meds need to be restarted.

## 2013-05-24 NOTE — Assessment & Plan Note (Signed)
Chronic problem.  Attempting to control w/ healthy diet and regular exercise.  Asymptomatic.  Check labs.  Start meds prn.

## 2013-05-24 NOTE — Assessment & Plan Note (Signed)
Chronic problem.  Well controlled.  Asymptomatic.  Check labs.  No anticipated med changes. 

## 2013-05-24 NOTE — Assessment & Plan Note (Signed)
Pt's PE WNL.  UTD on health maintenance.  Check labs.  Anticipatory guidance provided.  

## 2013-05-24 NOTE — Patient Instructions (Signed)
Follow up in 6 months to recheck BP We'll notify you of your lab results and make any changes if needed We'll call you with your bone density and mammo appt Keep up the good work!  You look great! Call with any questions or concerns Happy Holidays!!!

## 2013-05-24 NOTE — Progress Notes (Signed)
   Subjective:    Patient ID: Melissa Jordan, female    DOB: 07-03-1945, 67 y.o.   MRN: [id number]  HPI Pre visit review using our clinic review tool, if applicable. No additional management support is needed unless otherwise documented below in the visit note.  Here today for CPE.  Risk Factors: HTN- chronic problem, on Metoprolol.  No CP, SOB, HAs, visual changes, edema. Hypothyroid- pt reports years ago had fatigue, hair loss and was found to be hypothyroid.  Was started on meds but level went 'sky high'.  Stopped meds and things seemed to normalize. Hyperlipidemia- recent labs show mild elevation of cholesterol, has never been on meds.  Working on [employer] and exercise.   Physical Activity: exercising regularly Fall Risk: low risk Depression: denies current sxs Hearing: normal to conversational tones and whispered voice at 6 ft ADL's: independent Cognitive: normal linear thought process, memory and attention intact Home Safety: safe at home Height, Weight, BMI, Visual Acuity: see vitals, vision corrected to 20/20 w/ glasses Counseling: UTD on colonoscopy, pap, mammo ([facility]).  Due for DEXA at end of year. Labs Ordered: See A&P Care Plan: See A&P    Review of Systems Patient reports no vision/ hearing changes, adenopathy,fever, weight change,  persistant/recurrent hoarseness , swallowing issues, chest pain, palpitations, edema, persistant/recurrent cough, hemoptysis, dyspnea (rest/exertional/paroxysmal nocturnal), gastrointestinal bleeding (melena, rectal bleeding), abdominal pain, significant heartburn, bowel changes, GU symptoms (dysuria, hematuria, incontinence), Gyn symptoms (abnormal  bleeding, pain),  syncope, focal weakness, memory loss, numbness & tingling, skin/hair/nail changes, abnormal bruising or bleeding, anxiety, or depression.     Objective:   Physical Exam General Appearance:    Alert, cooperative, no distress, appears stated age  Head:     Normocephalic, without obvious abnormality, atraumatic  Eyes:    PERRL, conjunctiva/corneas clear, EOM's intact, fundi    benign, both eyes  Ears:    Normal TM's and external ear canals, both ears  Nose:   Nares normal, septum midline, mucosa normal, no drainage    or sinus tenderness  Throat:   Lips, mucosa, and tongue normal; teeth and gums normal  Neck:   Supple, symmetrical, trachea midline, no adenopathy;    Thyroid: no enlargement/tenderness/nodules  Back:     Symmetric, no curvature, ROM normal, no CVA tenderness  Lungs:     Clear to auscultation bilaterally, respirations unlabored  Chest Wall:    No tenderness or deformity   Heart:    Regular rate and rhythm, S1 and S2 normal, no murmur, rub   or gallop  Breast Exam:    Deferred to GYN  Abdomen:     Soft, non-tender, bowel sounds active all four quadrants,    no masses, no organomegaly  Genitalia:    Deferred to GYN  Rectal:    Extremities:   Extremities normal, atraumatic, no cyanosis or edema  Pulses:   2+ and symmetric all extremities  Skin:   Skin color, texture, turgor normal, no rashes or lesions  Lymph nodes:   Cervical, supraclavicular, and axillary nodes normal  Neurologic:   CNII-XII intact, normal strength, sensation and reflexes    throughout          Assessment & Plan:

## 2013-05-25 ENCOUNTER — Encounter: Payer: Self-pay | Admitting: General Practice

## 2013-06-23 HISTORY — PX: LUMBAR DISC SURGERY: SHX700

## 2013-07-01 ENCOUNTER — Ambulatory Visit: Payer: Medicare Other

## 2013-07-01 ENCOUNTER — Other Ambulatory Visit: Payer: Medicare Other

## 2013-07-18 ENCOUNTER — Ambulatory Visit
Admission: RE | Admit: 2013-07-18 | Discharge: 2013-07-18 | Disposition: A | Discharge status: Home / Self Care | Payer: Medicare Other | Source: Ambulatory Visit | Attending: Family Medicine | Admitting: Family Medicine

## 2013-07-18 DIAGNOSIS — E2839 Other primary ovarian failure: Secondary | ICD-10-CM

## 2013-07-18 DIAGNOSIS — Z1231 Encounter for screening mammogram for malignant neoplasm of breast: Secondary | ICD-10-CM

## 2013-07-18 LAB — HM DEXA SCAN: HM Dexa Scan: NORMAL

## 2013-07-19 ENCOUNTER — Encounter: Payer: Self-pay | Admitting: General Practice

## 2013-08-16 ENCOUNTER — Encounter: Payer: Self-pay | Admitting: General Practice

## 2013-10-26 ENCOUNTER — Ambulatory Visit (INDEPENDENT_AMBULATORY_CARE_PROVIDER_SITE_OTHER): Payer: Medicare Other | Admitting: Family Medicine

## 2013-10-26 ENCOUNTER — Encounter: Payer: Self-pay | Admitting: Family Medicine

## 2013-10-26 VITALS — BP 140/84 | HR 67 | Temp 98.4°F | Resp 16 | Wt 149.2 lb

## 2013-10-26 DIAGNOSIS — J329 Chronic sinusitis, unspecified: Secondary | ICD-10-CM

## 2013-10-26 MED ORDER — PROMETHAZINE-DM 6.25-15 MG/5ML PO SYRP: 5.0000 mL | ORAL_SOLUTION | Freq: Four times a day (QID) | ORAL | Status: DC | PRN

## 2013-10-26 MED ORDER — SULFAMETHOXAZOLE-TMP DS 800-160 MG PO TABS: 1.0000 | ORAL_TABLET | Freq: Two times a day (BID) | ORAL | Status: DC

## 2013-10-26 NOTE — Patient Instructions (Signed)
Follow up as needed Start the Bactrim twice daily- take w/ food- for the sinus infection Drink plenty of fluids REST! Use the promethazine cough syrup as needed- will cause drowsiness Call with any questions or concerns Hang in there! Happy Mother's Day!!!

## 2013-10-26 NOTE — Progress Notes (Signed)
   Subjective:    Patient ID: Melissa SenegalDonna H Lamboy, female    DOB: 12/05/1945, 68 y.o.   MRN: [id number]  Sinusitis Associated symptoms include coughing, ear pain and headaches.  Cough Associated symptoms include ear pain and headaches.  Headache  Associated symptoms include coughing and ear pain.  Otalgia  Associated symptoms include coughing and headaches.   Sinusitis- R ear pain, sinus pressure.  sxs have been 'building up ever since the weather got nice'.  + nasal congestion, thick drainage.  Using Claritin and Nasacort.  Severe tooth pain on R.  + sore throat due to PND.  No fevers.  No known sick contacts.  Taking ibuprofen, tylenol.   Review of Systems  HENT: Positive for ear pain.   Respiratory: Positive for cough.   Neurological: Positive for headaches.   For ROS see HPI     Objective:   Physical Exam  Vitals reviewed. Constitutional: She appears well-developed and well-nourished. No distress.  HENT:  Head: Normocephalic and atraumatic.  Right Ear: Tympanic membrane normal.  Left Ear: Tympanic membrane normal.  Nose: Mucosal edema and rhinorrhea present. Right sinus exhibits maxillary sinus tenderness and frontal sinus tenderness. Left sinus exhibits maxillary sinus tenderness and frontal sinus tenderness.  Mouth/Throat: Uvula is midline and mucous membranes are normal. Posterior oropharyngeal erythema present. No oropharyngeal exudate.  Eyes: Conjunctivae and EOM are normal. Pupils are equal, round, and reactive to light.  Neck: Normal range of motion. Neck supple.  Cardiovascular: Normal rate, regular rhythm and normal heart sounds.   Pulmonary/Chest: Effort normal and breath sounds normal. No respiratory distress. She has no wheezes.  Lymphadenopathy:    She has no cervical adenopathy.          Assessment & Plan:

## 2013-10-26 NOTE — Progress Notes (Signed)
Pre visit review using our clinic review tool, if applicable. No additional management support is needed unless otherwise documented below in the visit note. 

## 2013-10-26 NOTE — Assessment & Plan Note (Signed)
Pt's sxs and PE consistent w/ infxn.  Start abx.  Cough meds prn.  Reviewed supportive care and red flags that should prompt return.  Pt expressed understanding and is in agreement w/ plan.  

## 2013-11-23 ENCOUNTER — Encounter: Payer: Self-pay | Admitting: Family Medicine

## 2013-11-23 ENCOUNTER — Ambulatory Visit (INDEPENDENT_AMBULATORY_CARE_PROVIDER_SITE_OTHER): Payer: Medicare Other | Admitting: Family Medicine

## 2013-11-23 VITALS — BP 122/80 | HR 68 | Temp 98.2°F | Resp 16 | Wt 147.2 lb

## 2013-11-23 DIAGNOSIS — L255 Unspecified contact dermatitis due to plants, except food: Secondary | ICD-10-CM

## 2013-11-23 MED ORDER — PREDNISONE 10 MG PO TABS: ORAL_TABLET | ORAL | Status: DC

## 2013-11-23 MED ORDER — TRIAMCINOLONE ACETONIDE 0.1 % EX OINT: 1.0000 "application " | TOPICAL_OINTMENT | Freq: Two times a day (BID) | CUTANEOUS | Status: DC

## 2013-11-23 MED ORDER — METHYLPREDNISOLONE ACETATE 80 MG/ML IJ SUSP
80.0000 mg | Freq: Once | INTRAMUSCULAR | Status: AC
  Administered 2013-11-23: 80 mg via INTRAMUSCULAR

## 2013-11-23 NOTE — Progress Notes (Signed)
Pre visit review using our clinic review tool, if applicable. No additional management support is needed unless otherwise documented below in the visit note. 

## 2013-11-23 NOTE — Patient Instructions (Signed)
Follow up as needed Start the Prednisone tomorrow morning w/ breakfast Apply the Triamcinolone ointment twice daily to the affected areas to help w/ the itching Call with any questions or concerns Hang in there!!

## 2013-11-23 NOTE — Progress Notes (Signed)
   Subjective:    Patient ID: Melissa Jordan, female    DOB: [birth year], 68 y.o.   MRN: [id number]  HPI Contact dermatitis- has hx of allergy to poison sumac.  Was weeding 2 days ago and subsequently developed rash.  Pt has red, swollen rash on R inner arm, R side of face, and R upper chest.  Very itchy.  No relief w/ Benadryl or topical cortisone.   Review of Systems For ROS see HPI     Objective:   Physical Exam  Vitals reviewed. Constitutional: She is oriented to person, place, and time. She appears well-developed and well-nourished. No distress.  Neurological: She is alert and oriented to person, place, and time.  Skin: Skin is warm and dry. Rash (consistent w/ plant dermatitis on R side of face, R side of chest, R inner arm) noted. There is erythema.          Assessment & Plan:

## 2013-11-23 NOTE — Addendum Note (Signed)
Addended by: Jackson Latino on: 11/23/2013 09:11 AM   Modules accepted: Orders

## 2013-11-23 NOTE — Assessment & Plan Note (Signed)
New.  Pt w/ severe itching and diffuse R sided rash.  Depo-medrol injxn today and start pred taper tomorrow.  Triamcinolone ointment as needed for itching.  Reviewed supportive care and red flags that should prompt return.  Pt expressed understanding and is in agreement w/ plan.

## 2014-02-13 ENCOUNTER — Other Ambulatory Visit: Payer: Self-pay | Admitting: Neurosurgery

## 2014-02-13 DIAGNOSIS — M47817 Spondylosis without myelopathy or radiculopathy, lumbosacral region: Secondary | ICD-10-CM

## 2014-02-17 ENCOUNTER — Ambulatory Visit
Admission: RE | Admit: 2014-02-17 | Discharge: 2014-02-17 | Disposition: A | Discharge status: Home / Self Care | Payer: Medicare Other | Source: Ambulatory Visit | Attending: Neurosurgery | Admitting: Neurosurgery

## 2014-02-17 DIAGNOSIS — M47817 Spondylosis without myelopathy or radiculopathy, lumbosacral region: Secondary | ICD-10-CM

## 2014-02-20 ENCOUNTER — Ambulatory Visit (INDEPENDENT_AMBULATORY_CARE_PROVIDER_SITE_OTHER): Payer: Medicare Other | Admitting: Family Medicine

## 2014-02-20 VITALS — BP 124/64 | HR 57 | Temp 98.3°F | Resp 16 | Ht 63.0 in | Wt 148.0 lb

## 2014-02-20 DIAGNOSIS — L03319 Cellulitis of trunk, unspecified: Secondary | ICD-10-CM

## 2014-02-20 DIAGNOSIS — L02219 Cutaneous abscess of trunk, unspecified: Secondary | ICD-10-CM

## 2014-02-20 MED ORDER — CEPHALEXIN 500 MG PO CAPS: 500.0000 mg | ORAL_CAPSULE | Freq: Three times a day (TID) | ORAL | Status: DC

## 2014-02-20 MED ORDER — PREDNISONE (PAK) 10 MG PO TABS: ORAL_TABLET | Freq: Every day | ORAL | Status: DC

## 2014-02-20 MED ORDER — HYDROXYZINE HCL 10 MG PO TABS: 10.0000 mg | ORAL_TABLET | Freq: Three times a day (TID) | ORAL | Status: DC | PRN
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
## 2014-02-20 NOTE — Patient Instructions (Signed)
Take the keflex for the infection.  Take the steroid burst pack for the itching and swelling.    Hydroxyzine will also will help with your itching.

## 2014-03-27 ENCOUNTER — Other Ambulatory Visit: Payer: Self-pay | Admitting: General Practice

## 2014-03-27 MED ORDER — ESTRADIOL 1 MG PO TABS: ORAL_TABLET | ORAL | Status: DC

## 2014-05-15 ENCOUNTER — Encounter: Payer: Self-pay | Admitting: Family Medicine

## 2014-05-15 ENCOUNTER — Ambulatory Visit (INDEPENDENT_AMBULATORY_CARE_PROVIDER_SITE_OTHER): Payer: Medicare Other | Admitting: Family Medicine

## 2014-05-15 VITALS — BP 120/68 | HR 58 | Temp 97.9°F | Resp 16 | Ht 63.0 in | Wt 155.2 lb

## 2014-05-15 DIAGNOSIS — Z0000 Encounter for general adult medical examination without abnormal findings: Secondary | ICD-10-CM

## 2014-05-15 DIAGNOSIS — J0101 Acute recurrent maxillary sinusitis: Secondary | ICD-10-CM

## 2014-05-15 DIAGNOSIS — M8580 Other specified disorders of bone density and structure, unspecified site: Secondary | ICD-10-CM

## 2014-05-15 DIAGNOSIS — E785 Hyperlipidemia, unspecified: Secondary | ICD-10-CM | POA: Diagnosis not present

## 2014-05-15 DIAGNOSIS — I10 Essential (primary) hypertension: Secondary | ICD-10-CM

## 2014-05-15 DIAGNOSIS — M859 Disorder of bone density and structure, unspecified: Secondary | ICD-10-CM | POA: Diagnosis not present

## 2014-05-15 LAB — HEPATIC FUNCTION PANEL
ALT: 13 U/L (ref 0–35)
AST: 18 U/L (ref 0–37)
Albumin: 3.9 g/dL (ref 3.5–5.2)
Alkaline Phosphatase: 69 U/L (ref 39–117)
Bilirubin, Direct: 0.1 mg/dL (ref 0.0–0.3)
Total Bilirubin: 0.5 mg/dL (ref 0.2–1.2)
Total Protein: 6.2 g/dL (ref 6.0–8.3)

## 2014-05-15 LAB — BASIC METABOLIC PANEL
BUN: 20 mg/dL (ref 6–23)
CO2: 26 mEq/L (ref 19–32)
Calcium: 8.8 mg/dL (ref 8.4–10.5)
Chloride: 103 mEq/L (ref 96–112)
Creatinine, Ser: 0.8 mg/dL (ref 0.4–1.2)
GFR: 80.36 mL/min (ref >60.00)
Glucose, Bld: 81 mg/dL (ref 70–99)
Potassium: 4.2 mEq/L (ref 3.5–5.1)
Sodium: 138 mEq/L (ref 135–145)

## 2014-05-15 LAB — LIPID PANEL
Cholesterol: 187 mg/dL (ref 0–200)
HDL: 56.6 mg/dL (ref >39.00)
LDL Cholesterol: 106 mg/dL — ABNORMAL HIGH (ref 0–99)
NonHDL: 130.4
Total CHOL/HDL Ratio: 3
Triglycerides: 122 mg/dL (ref 0.0–149.0)
VLDL: 24.4 mg/dL (ref 0.0–40.0)

## 2014-05-15 LAB — CBC WITH DIFFERENTIAL/PLATELET
Basophils Absolute: 0 10*3/uL (ref 0.0–0.1)
Basophils Relative: 0.6 % (ref 0.0–3.0)
Eosinophils Absolute: 0.2 10*3/uL (ref 0.0–0.7)
Eosinophils Relative: 2.7 % (ref 0.0–5.0)
HCT: 39.3 % (ref 36.0–46.0)
Hemoglobin: 12.8 g/dL (ref 12.0–15.0)
Lymphocytes Relative: 23.7 % (ref 12.0–46.0)
Lymphs Abs: 1.8 10*3/uL (ref 0.7–4.0)
MCHC: 32.7 g/dL (ref 30.0–36.0)
MCV: 90.6 fl (ref 78.0–100.0)
Monocytes Absolute: 0.6 10*3/uL (ref 0.1–1.0)
Monocytes Relative: 8.1 % (ref 3.0–12.0)
Neutro Abs: 5 10*3/uL (ref 1.4–7.7)
Neutrophils Relative %: 64.9 % (ref 43.0–77.0)
Platelets: 203 10*3/uL (ref 150.0–400.0)
RBC: 4.34 Mil/uL (ref 3.87–5.11)
RDW: 13.3 % (ref 11.5–15.5)
WBC: 7.7 10*3/uL (ref 4.0–10.5)

## 2014-05-15 LAB — TSH: TSH: 1.95 u[IU]/mL (ref 0.35–4.50)

## 2014-05-15 LAB — VITAMIN D 25 HYDROXY (VIT D DEFICIENCY, FRACTURES): VITD: 20.76 ng/mL — ABNORMAL LOW (ref 30.00–100.00)

## 2014-05-15 MED ORDER — ESTRADIOL 1 MG PO TABS
ORAL_TABLET | ORAL | Status: DC
Start: 2014-05-15 — End: 2015-04-30

## 2014-05-15 MED ORDER — SULFAMETHOXAZOLE-TRIMETHOPRIM 800-160 MG PO TABS: 1.0000 | ORAL_TABLET | Freq: Two times a day (BID) | ORAL | Status: DC

## 2014-05-15 MED ORDER — METOPROLOL TARTRATE 50 MG PO TABS: ORAL_TABLET | ORAL | Status: DC

## 2014-05-15 NOTE — Assessment & Plan Note (Signed)
Chronic problem.  Well controlled.  Asymptomatic.  No anticipated med changes. 

## 2014-05-15 NOTE — Assessment & Plan Note (Signed)
Pt's PE WNL.  UTD on health maintenance.  Written screening schedule updated and given to pt.  Living will forms given for pt to complete.  Check labs.  Anticipatory guidance provided.

## 2014-05-15 NOTE — Assessment & Plan Note (Signed)
Chronic problem.  Attempting to control w/ healthy diet.  Not on meds.  Due for repeat labs.  Start meds prn.

## 2014-05-15 NOTE — Progress Notes (Signed)
   Subjective:    Patient ID: Melissa Jordan, female    DOB: [birth year], 68 y.o.   MRN: [id number]  HPI Here today for CPE.  Risk Factors: HTN- chronic problem, well controlled on Metoprolol. Hyperlipidemia- noted on previous labs.  Attempting to control w/ healthy diet and regular exercise Sinusitis- sxs started over the weekend, went to dentist to ensure it was not a sore tooth.  + L maxillary sinus pain/pressure w/ swelling.  Hx of similar.  No fevers.  Pain w/ leaning forward. Physical Activity: limited due to chronic back pain- following w/ Dr Channing Muttersoy. Fall Risk: low Depression: denies current sxs Hearing: normal to conversational tones and whispered voice ADL's: Independent Cognitive: normal linear thought process, memory and attention intact Home Safety: safe at home, lives w/ husband Height, Weight, BMI, Visual Acuity: see vitals, vision corrected to 20/20 w/ glasses Health Care POA/Living Will- Children are aware of wishes but no formal paperwork Counseling:  UTD on mammo, DEXA, colonoscopy.  No need for paps due to age>65. Labs Ordered: See A&P Care Plan: See A&P    Review of Systems Patient reports no vision/ hearing changes, adenopathy,fever, weight change,  persistant/recurrent hoarseness , swallowing issues, chest pain, palpitations, edema, persistant/recurrent cough, hemoptysis, dyspnea (rest/exertional/paroxysmal nocturnal), gastrointestinal bleeding (melena, rectal bleeding), abdominal pain, significant heartburn, bowel changes, GU symptoms (dysuria, hematuria, incontinence), Gyn symptoms (abnormal  bleeding, pain),  syncope, focal weakness, memory loss, numbness & tingling, skin/hair/nail changes, abnormal bruising or bleeding, anxiety, or depression.     Objective:   Physical Exam  Constitutional: She is oriented to person, place, and time. She appears well-developed and well-nourished. No distress.  HENT:  Head: Normocephalic and atraumatic.  Right Ear: Tympanic  membrane normal.  Left Ear: Tympanic membrane normal.  Nose: Mucosal edema and rhinorrhea present. Right sinus exhibits maxillary sinus tenderness. Right sinus exhibits no frontal sinus tenderness. Left sinus exhibits maxillary sinus tenderness. Left sinus exhibits no frontal sinus tenderness.  Mouth/Throat: Uvula is midline and mucous membranes are normal. Posterior oropharyngeal erythema present. No oropharyngeal exudate.  Eyes: Conjunctivae and EOM are normal. Pupils are equal, round, and reactive to light.  Neck: Normal range of motion. Neck supple. No thyromegaly present.  Cardiovascular: Normal rate, regular rhythm and normal heart sounds.   Pulmonary/Chest: Effort normal and breath sounds normal. No respiratory distress. She has no wheezes.  Breast exam deferred to mammo  Abdominal: Soft. Bowel sounds are normal. She exhibits no distension. There is no tenderness. There is no rebound and no guarding.  Genitourinary:  deferred  Musculoskeletal: She exhibits no edema.  Lymphadenopathy:    She has no cervical adenopathy.  Neurological: She is alert and oriented to person, place, and time. No cranial nerve deficit. Coordination normal.  Skin: Skin is warm and dry. No rash noted. No erythema.  Psychiatric: She has a normal mood and affect. Her behavior is normal. Thought content normal.  Vitals reviewed.         Assessment & Plan:

## 2014-05-15 NOTE — Assessment & Plan Note (Signed)
Pt's sxs and PE consistent w/ infxn.  Start abx.  Reviewed supportive care and red flags that should prompt return.  Pt expressed understanding and is in agreement w/ plan.  

## 2014-05-15 NOTE — Progress Notes (Signed)
Pre visit review using our clinic review tool, if applicable. No additional management support is needed unless otherwise documented below in the visit note. 

## 2014-05-15 NOTE — Assessment & Plan Note (Signed)
Chronic problem, hx of Vit D deficiency.  Check labs.  Replete Vit D prn.

## 2014-05-15 NOTE — Patient Instructions (Signed)
Follow up in 6 months to recheck BP Schedule a nurse visit for your flu shot after you complete your antibiotics We'll notify you of your lab results and make any changes if needed Start the Bactrim twice daily- take w/ food- for the sinus infection Drink plenty of fluids REST! Call with any questions or concerns Happy Holidays!!!

## 2014-05-17 ENCOUNTER — Other Ambulatory Visit: Payer: Self-pay | Admitting: General Practice

## 2014-05-17 ENCOUNTER — Telehealth: Payer: Self-pay | Admitting: Family Medicine

## 2014-05-17 MED ORDER — VITAMIN D (ERGOCALCIFEROL) 1.25 MG (50000 UNIT) PO CAPS: 50000.0000 [IU] | ORAL_CAPSULE | ORAL | Status: DC

## 2014-05-17 NOTE — Telephone Encounter (Signed)
Results have not be commented on. Will call as soon as i have results.

## 2014-05-17 NOTE — Telephone Encounter (Signed)
Caller name: Tedra SenegalRoberson, Brannon H Relation to pt: self  Call back number: [phone]   Reason for call:  Pt requesting a refill of lab results

## 2014-05-30 ENCOUNTER — Ambulatory Visit (INDEPENDENT_AMBULATORY_CARE_PROVIDER_SITE_OTHER): Payer: Medicare Other

## 2014-05-30 DIAGNOSIS — Z23 Encounter for immunization: Secondary | ICD-10-CM

## 2014-05-30 NOTE — Progress Notes (Signed)
Pre visit review using our clinic review tool, if applicable. No additional management support is needed unless otherwise documented below in the visit note. 

## 2014-05-30 NOTE — Progress Notes (Signed)
Pt tolerated injection well.  No signs of a reaction upon leaving the clinic.   

## 2014-06-27 DIAGNOSIS — M25552 Pain in left hip: Secondary | ICD-10-CM | POA: Diagnosis not present

## 2014-08-03 DIAGNOSIS — M1990 Unspecified osteoarthritis, unspecified site: Secondary | ICD-10-CM | POA: Diagnosis not present

## 2014-08-03 DIAGNOSIS — M47816 Spondylosis without myelopathy or radiculopathy, lumbar region: Secondary | ICD-10-CM | POA: Diagnosis not present

## 2014-08-08 ENCOUNTER — Other Ambulatory Visit: Payer: Self-pay | Admitting: Neurosurgery

## 2014-08-08 DIAGNOSIS — M47816 Spondylosis without myelopathy or radiculopathy, lumbar region: Secondary | ICD-10-CM

## 2014-08-09 ENCOUNTER — Ambulatory Visit
Admission: RE | Admit: 2014-08-09 | Discharge: 2014-08-09 | Disposition: A | Discharge status: Home / Self Care | Payer: Medicare Other | Source: Ambulatory Visit | Attending: Neurosurgery | Admitting: Neurosurgery

## 2014-08-09 DIAGNOSIS — M47816 Spondylosis without myelopathy or radiculopathy, lumbar region: Secondary | ICD-10-CM

## 2014-08-09 DIAGNOSIS — M545 Low back pain: Secondary | ICD-10-CM | POA: Diagnosis not present

## 2014-08-09 MED ORDER — METHYLPREDNISOLONE ACETATE 40 MG/ML INJ SUSP (RADIOLOG
120.0000 mg | Freq: Once | INTRAMUSCULAR | Status: AC
  Administered 2014-08-09: 120 mg via EPIDURAL

## 2014-08-09 MED ORDER — IOHEXOL 180 MG/ML  SOLN
1.0000 mL | Freq: Once | INTRAMUSCULAR | Status: AC | PRN
  Administered 2014-08-09: 1 mL via EPIDURAL

## 2014-08-09 NOTE — Discharge Instructions (Signed)

## 2014-09-06 DIAGNOSIS — M1990 Unspecified osteoarthritis, unspecified site: Secondary | ICD-10-CM | POA: Diagnosis not present

## 2014-09-06 DIAGNOSIS — M47816 Spondylosis without myelopathy or radiculopathy, lumbar region: Secondary | ICD-10-CM | POA: Diagnosis not present

## 2014-09-26 ENCOUNTER — Other Ambulatory Visit: Payer: Self-pay | Admitting: Neurosurgery

## 2014-09-26 DIAGNOSIS — M47816 Spondylosis without myelopathy or radiculopathy, lumbar region: Secondary | ICD-10-CM

## 2014-09-27 ENCOUNTER — Ambulatory Visit
Admission: RE | Admit: 2014-09-27 | Discharge: 2014-09-27 | Disposition: A | Discharge status: Home / Self Care | Payer: Medicare Other | Source: Ambulatory Visit | Attending: Neurosurgery | Admitting: Neurosurgery

## 2014-09-27 DIAGNOSIS — M47816 Spondylosis without myelopathy or radiculopathy, lumbar region: Secondary | ICD-10-CM

## 2014-09-27 DIAGNOSIS — M545 Low back pain: Secondary | ICD-10-CM | POA: Diagnosis not present

## 2014-09-27 MED ORDER — IOHEXOL 180 MG/ML  SOLN
1.0000 mL | Freq: Once | INTRAMUSCULAR | Status: AC | PRN
Start: 2014-09-27 — End: 2014-09-27
  Administered 2014-09-27: 1 mL via EPIDURAL

## 2014-09-27 MED ORDER — METHYLPREDNISOLONE ACETATE 40 MG/ML INJ SUSP (RADIOLOG
120.0000 mg | Freq: Once | INTRAMUSCULAR | Status: AC
  Administered 2014-09-27: 120 mg via EPIDURAL

## 2014-10-16 DIAGNOSIS — M47816 Spondylosis without myelopathy or radiculopathy, lumbar region: Secondary | ICD-10-CM | POA: Diagnosis not present

## 2014-10-20 ENCOUNTER — Encounter: Payer: Self-pay | Admitting: Internal Medicine

## 2014-10-27 DIAGNOSIS — Z96653 Presence of artificial knee joint, bilateral: Secondary | ICD-10-CM | POA: Diagnosis not present

## 2014-10-27 DIAGNOSIS — M47816 Spondylosis without myelopathy or radiculopathy, lumbar region: Secondary | ICD-10-CM | POA: Diagnosis not present

## 2014-10-27 DIAGNOSIS — Z981 Arthrodesis status: Secondary | ICD-10-CM | POA: Diagnosis not present

## 2014-10-27 DIAGNOSIS — R002 Palpitations: Secondary | ICD-10-CM | POA: Diagnosis not present

## 2014-10-27 DIAGNOSIS — Z79890 Hormone replacement therapy (postmenopausal): Secondary | ICD-10-CM | POA: Diagnosis not present

## 2014-10-27 DIAGNOSIS — Z79891 Long term (current) use of opiate analgesic: Secondary | ICD-10-CM | POA: Diagnosis not present

## 2014-10-27 DIAGNOSIS — Z79899 Other long term (current) drug therapy: Secondary | ICD-10-CM | POA: Diagnosis not present

## 2014-10-27 DIAGNOSIS — Z90710 Acquired absence of both cervix and uterus: Secondary | ICD-10-CM | POA: Diagnosis not present

## 2014-10-27 DIAGNOSIS — Z01812 Encounter for preprocedural laboratory examination: Secondary | ICD-10-CM | POA: Diagnosis not present

## 2014-11-01 DIAGNOSIS — R5381 Other malaise: Secondary | ICD-10-CM | POA: Diagnosis not present

## 2014-11-01 DIAGNOSIS — Z888 Allergy status to other drugs, medicaments and biological substances status: Secondary | ICD-10-CM | POA: Diagnosis not present

## 2014-11-01 DIAGNOSIS — Z885 Allergy status to narcotic agent status: Secondary | ICD-10-CM | POA: Diagnosis not present

## 2014-11-01 DIAGNOSIS — I10 Essential (primary) hypertension: Secondary | ICD-10-CM | POA: Diagnosis not present

## 2014-11-01 DIAGNOSIS — M47816 Spondylosis without myelopathy or radiculopathy, lumbar region: Secondary | ICD-10-CM | POA: Diagnosis not present

## 2014-11-01 DIAGNOSIS — R112 Nausea with vomiting, unspecified: Secondary | ICD-10-CM | POA: Diagnosis not present

## 2014-11-01 DIAGNOSIS — M79602 Pain in left arm: Secondary | ICD-10-CM | POA: Diagnosis not present

## 2014-11-01 DIAGNOSIS — M4306 Spondylolysis, lumbar region: Secondary | ICD-10-CM | POA: Diagnosis not present

## 2014-11-01 DIAGNOSIS — Z886 Allergy status to analgesic agent status: Secondary | ICD-10-CM | POA: Diagnosis not present

## 2014-11-01 DIAGNOSIS — R079 Chest pain, unspecified: Secondary | ICD-10-CM | POA: Diagnosis not present

## 2014-11-01 DIAGNOSIS — K5900 Constipation, unspecified: Secondary | ICD-10-CM | POA: Diagnosis not present

## 2014-11-01 DIAGNOSIS — M47896 Other spondylosis, lumbar region: Secondary | ICD-10-CM | POA: Diagnosis not present

## 2014-11-01 DIAGNOSIS — Z981 Arthrodesis status: Secondary | ICD-10-CM | POA: Diagnosis not present

## 2014-11-01 DIAGNOSIS — M1990 Unspecified osteoarthritis, unspecified site: Secondary | ICD-10-CM | POA: Diagnosis not present

## 2014-11-01 DIAGNOSIS — Z79899 Other long term (current) drug therapy: Secondary | ICD-10-CM | POA: Diagnosis not present

## 2014-11-03 DIAGNOSIS — M47896 Other spondylosis, lumbar region: Secondary | ICD-10-CM | POA: Diagnosis not present

## 2014-11-09 DIAGNOSIS — M4326 Fusion of spine, lumbar region: Secondary | ICD-10-CM | POA: Diagnosis not present

## 2014-11-09 DIAGNOSIS — M47816 Spondylosis without myelopathy or radiculopathy, lumbar region: Secondary | ICD-10-CM | POA: Diagnosis not present

## 2014-11-09 DIAGNOSIS — Z981 Arthrodesis status: Secondary | ICD-10-CM | POA: Diagnosis not present

## 2014-11-13 ENCOUNTER — Encounter: Payer: Self-pay | Admitting: Family Medicine

## 2014-11-13 ENCOUNTER — Ambulatory Visit (INDEPENDENT_AMBULATORY_CARE_PROVIDER_SITE_OTHER): Payer: Medicare Other | Admitting: Family Medicine

## 2014-11-13 VITALS — BP 110/72 | HR 68 | Temp 97.9°F | Resp 16 | Wt 157.0 lb

## 2014-11-13 DIAGNOSIS — I10 Essential (primary) hypertension: Secondary | ICD-10-CM | POA: Diagnosis not present

## 2014-11-13 DIAGNOSIS — M5430 Sciatica, unspecified side: Secondary | ICD-10-CM

## 2014-11-13 DIAGNOSIS — M545 Low back pain: Secondary | ICD-10-CM

## 2014-11-13 DIAGNOSIS — M5440 Lumbago with sciatica, unspecified side: Secondary | ICD-10-CM

## 2014-11-13 LAB — CBC WITH DIFFERENTIAL/PLATELET
Basophils Absolute: 0 10*3/uL (ref 0.0–0.1)
Basophils Relative: 0.3 % (ref 0.0–3.0)
Eosinophils Absolute: 0.1 10*3/uL (ref 0.0–0.7)
Eosinophils Relative: 1.4 % (ref 0.0–5.0)
HCT: 31.2 % — ABNORMAL LOW (ref 36.0–46.0)
Hemoglobin: 10.3 g/dL — ABNORMAL LOW (ref 12.0–15.0)
Lymphocytes Relative: 14.1 % (ref 12.0–46.0)
Lymphs Abs: 1.5 10*3/uL (ref 0.7–4.0)
MCHC: 32.8 g/dL (ref 30.0–36.0)
MCV: 90.2 fl (ref 78.0–100.0)
Monocytes Absolute: 0.7 10*3/uL (ref 0.1–1.0)
Monocytes Relative: 6.8 % (ref 3.0–12.0)
Neutro Abs: 8.1 10*3/uL — ABNORMAL HIGH (ref 1.4–7.7)
Neutrophils Relative %: 77.4 % — ABNORMAL HIGH (ref 43.0–77.0)
Platelets: 435 10*3/uL — ABNORMAL HIGH (ref 150.0–400.0)
RBC: 3.46 Mil/uL — ABNORMAL LOW (ref 3.87–5.11)
RDW: 14.2 % (ref 11.5–15.5)
WBC: 10.5 10*3/uL (ref 4.0–10.5)

## 2014-11-13 LAB — BASIC METABOLIC PANEL
BUN: 15 mg/dL (ref 6–23)
CO2: 27 mEq/L (ref 19–32)
Calcium: 9.1 mg/dL (ref 8.4–10.5)
Chloride: 104 mEq/L (ref 96–112)
Creatinine, Ser: 0.67 mg/dL (ref 0.40–1.20)
GFR: 92.81 mL/min (ref >60.00)
Glucose, Bld: 94 mg/dL (ref 70–99)
Potassium: 3.9 mEq/L (ref 3.5–5.1)
Sodium: 137 mEq/L (ref 135–145)

## 2014-11-13 NOTE — Assessment & Plan Note (Signed)
Chronic problem.  Excellent control despite her pain.  Asymptomatic.  Check BMP and CBC.  No med changes at this time.  Will follow.

## 2014-11-13 NOTE — Assessment & Plan Note (Signed)
Chronic problem for pt.  Just had 5th back surgery w/ Dr Channing Muttersoy.  Remains in back brace, in considerable pain but has a wonderful attitude about the whole thing.  Will continue to offer my support and follow.

## 2014-11-13 NOTE — Progress Notes (Signed)
Pre visit review using our clinic review tool, if applicable. No additional management support is needed unless otherwise documented below in the visit note. 

## 2014-11-13 NOTE — Addendum Note (Signed)
Addended by: Jackson LatinoYLER, Clotee Schlicker L on: 11/13/2014 10:01 AM   Modules accepted: Medications

## 2014-11-13 NOTE — Patient Instructions (Signed)
Schedule your complete physical in 6 months We'll notify you of your lab results and make any changes if needed Keep up the good work!  You look great! Call with any questions or concerns Hang in there!  You're amazing!!

## 2014-11-13 NOTE — Progress Notes (Signed)
   Subjective:    Patient ID: Tedra SenegalDonna H Koegel, female    DOB: 11/23/1945, 69 y.o.   MRN: [id number]  HPI HTN- chronic problem, on Metoprolol once daily, 'unless tachycardic' and then she takes 2nd pill.  Denies CP, SOB, HAs, visual changes, edema.  Back surgery- pt had surgery on 5/11, pt had plate inserted between L2-3 and is now fused from L2-L5.  Surgeon- Dr Channing Muttersoy.  Pt reports she is in considerable pain from this surgery due to lateral incisions and cutting through muscle.  This is pt's 5th back surgery.   Review of Systems For ROS see HPI     Objective:   Physical Exam  Constitutional: She is oriented to person, place, and time. She appears well-developed and well-nourished. No distress.  HENT:  Head: Normocephalic and atraumatic.  Eyes: Conjunctivae and EOM are normal. Pupils are equal, round, and reactive to light.  Neck: Normal range of motion. Neck supple. No thyromegaly present.  Cardiovascular: Normal rate, regular rhythm, normal heart sounds and intact distal pulses.   No murmur heard. Pulmonary/Chest: Effort normal and breath sounds normal. No respiratory distress.  Abdominal: Soft. She exhibits no distension. There is no tenderness.  Musculoskeletal: She exhibits no edema.  Lymphadenopathy:    She has no cervical adenopathy.  Neurological: She is alert and oriented to person, place, and time.  Skin: Skin is warm and dry.  Psychiatric: She has a normal mood and affect. Her behavior is normal.  Vitals reviewed.         Assessment & Plan:

## 2015-01-10 ENCOUNTER — Telehealth: Payer: Self-pay | Admitting: Family Medicine

## 2015-01-10 NOTE — Telephone Encounter (Signed)
Had back surgery 2 months ago.    C/o:  Pt states she just does not feel right.  Unable to take a deep breath.  Has to force self to take a deep breath.  At the base of right lung-"it kind of feels like it did before when I had pneumonia, almost like it's starting."  Fatigued.  Not sleeping well at night.  Restless. Muscle aches in arms and legs.  Symptoms started 4-5 days ago.  Denies chest pain, shortness of breath, fever, edema and recent blood loss.  Has a dry cough.  Last time she saw Dr. Beverely Lowabori, anemia was noted.  Pt has been taking iron supplement.    Has an appointment tomorrow morning with surgery.  Appointment with Dr. Beverely Lowabori tomorrow at 3:30 pm.   Advice:  Keep appointment as scheduled.  If symptoms worsen or new symptoms develop, go to ER. Pt stated understanding and agreed.

## 2015-01-10 NOTE — Telephone Encounter (Signed)
Caller name: Lupita LeashDonna Relation to pt: self Call back number: [phone]  Pharmacy:  Reason for call:   Patient feels like she can't get a deep breath. Denies SOB. Having leg cramps and "just doesn't feel right".

## 2015-01-11 ENCOUNTER — Encounter: Payer: Self-pay | Admitting: Family Medicine

## 2015-01-11 ENCOUNTER — Ambulatory Visit (HOSPITAL_BASED_OUTPATIENT_CLINIC_OR_DEPARTMENT_OTHER)
Admission: RE | Admit: 2015-01-11 | Discharge: 2015-01-11 | Disposition: A | Discharge status: Home / Self Care | Payer: Medicare Other | Source: Ambulatory Visit | Attending: Family Medicine | Admitting: Family Medicine

## 2015-01-11 ENCOUNTER — Ambulatory Visit (INDEPENDENT_AMBULATORY_CARE_PROVIDER_SITE_OTHER): Payer: Medicare Other | Admitting: Family Medicine

## 2015-01-11 VITALS — BP 118/72 | HR 70 | Temp 98.3°F | Resp 16 | Wt 154.4 lb

## 2015-01-11 DIAGNOSIS — G2581 Restless legs syndrome: Secondary | ICD-10-CM | POA: Diagnosis not present

## 2015-01-11 DIAGNOSIS — J45909 Unspecified asthma, uncomplicated: Secondary | ICD-10-CM | POA: Insufficient documentation

## 2015-01-11 DIAGNOSIS — R0602 Shortness of breath: Secondary | ICD-10-CM

## 2015-01-11 DIAGNOSIS — R5383 Other fatigue: Secondary | ICD-10-CM | POA: Diagnosis not present

## 2015-01-11 DIAGNOSIS — R05 Cough: Secondary | ICD-10-CM | POA: Diagnosis not present

## 2015-01-11 MED ORDER — ROPINIROLE HCL 0.5 MG PO TABS: 0.5000 mg | ORAL_TABLET | Freq: Every day | ORAL | Status: DC

## 2015-01-11 NOTE — Progress Notes (Signed)
Pre visit review using our clinic review tool, if applicable. No additional management support is needed unless otherwise documented below in the visit note. 

## 2015-01-11 NOTE — Assessment & Plan Note (Signed)
Recurring issue for pt.  Suspect this is due to her very poor sleep due to RLS symptoms and her pacing at night.  Will r/o metabolic causes w/ labs.  Will start treatment for RLS and monitor for improvement.

## 2015-01-11 NOTE — Patient Instructions (Signed)
Follow up in 2-3 weeks to recheck fatigue and leg pain Go downstairs and get your chest xray We'll notify you of your lab results and make any changes if needed Start the requip- 1/2 tab prior to bed x1 week and then increase to 1 tab as needed for better symptom control Call with any questions or concerns Hang in there!!!!

## 2015-01-11 NOTE — Progress Notes (Signed)
   Subjective:    Patient ID: Melissa Jordan, female    DOB: 06/02/1946, 69 y.o.   MRN: [id number]  HPI Fatigue- pt reports sxs are getting progressively worse.  'i'm tired all the time'.  Feels that she is unable to take a deep breath.  Unable to sleep 'at least 3x/week' due to leg pain and need to continuously move.  Will get up b/c 'i need to walk'.  Pt has increased fluids, started iron supplement.   Review of Systems For ROS see HPI     Objective:   Physical Exam  Constitutional: She is oriented to person, place, and time. She appears well-developed and well-nourished. No distress.  HENT:  Head: Normocephalic and atraumatic.  Eyes: Conjunctivae and EOM are normal. Pupils are equal, round, and reactive to light.  Neck: Normal range of motion. Neck supple. No thyromegaly present.  Cardiovascular: Normal rate, regular rhythm, normal heart sounds and intact distal pulses.   No murmur heard. Pulmonary/Chest: Effort normal and breath sounds normal. No respiratory distress.  Abdominal: Soft. She exhibits no distension. There is no tenderness.  Musculoskeletal: She exhibits no edema.  Lymphadenopathy:    She has no cervical adenopathy.  Neurological: She is alert and oriented to person, place, and time.  Skin: Skin is warm and dry.  Psychiatric: She has a normal mood and affect. Her behavior is normal.  Vitals reviewed.         Assessment & Plan:

## 2015-01-11 NOTE — Assessment & Plan Note (Signed)
New.  Pt's sxs are severe.  Start Requip as pt reports she had previous bad reaction to benzos- 'i was out of it for days'.  Will start w/ low dose and titrate prn.  Will follow.

## 2015-01-11 NOTE — Assessment & Plan Note (Signed)
New.  No obvious cause on PE.  Will get labs to r/o metabolic causes and get CXR to assess.  Suspect this is subjective due to fatigue.  Will follow closely.

## 2015-01-12 ENCOUNTER — Encounter: Payer: Self-pay | Admitting: *Deleted

## 2015-01-12 ENCOUNTER — Telehealth: Payer: Self-pay | Admitting: *Deleted

## 2015-01-12 LAB — TSH: TSH: 1.38 u[IU]/mL (ref 0.35–4.50)

## 2015-01-12 LAB — B12 AND FOLATE PANEL
Folate: 11.6 ng/mL (ref >5.9)
Vitamin B-12: 372 pg/mL (ref 211–911)

## 2015-01-12 LAB — CBC WITH DIFFERENTIAL/PLATELET
Basophils Absolute: 0.1 10*3/uL (ref 0.0–0.1)
Basophils Relative: 0.8 % (ref 0.0–3.0)
Eosinophils Absolute: 0.2 10*3/uL (ref 0.0–0.7)
Eosinophils Relative: 2.8 % (ref 0.0–5.0)
HCT: 40.3 % (ref 36.0–46.0)
Hemoglobin: 13.3 g/dL (ref 12.0–15.0)
Lymphocytes Relative: 24.9 % (ref 12.0–46.0)
Lymphs Abs: 2 10*3/uL (ref 0.7–4.0)
MCHC: 33.1 g/dL (ref 30.0–36.0)
MCV: 86.7 fl (ref 78.0–100.0)
Monocytes Absolute: 0.5 10*3/uL (ref 0.1–1.0)
Monocytes Relative: 6.8 % (ref 3.0–12.0)
Neutro Abs: 5.1 10*3/uL (ref 1.4–7.7)
Neutrophils Relative %: 64.7 % (ref 43.0–77.0)
Platelets: 212 10*3/uL (ref 150.0–400.0)
RBC: 4.65 Mil/uL (ref 3.87–5.11)
RDW: 13.4 % (ref 11.5–15.5)
WBC: 7.9 10*3/uL (ref 4.0–10.5)

## 2015-01-12 LAB — BASIC METABOLIC PANEL
BUN: 16 mg/dL (ref 6–23)
CO2: 29 mEq/L (ref 19–32)
Calcium: 9.1 mg/dL (ref 8.4–10.5)
Chloride: 104 mEq/L (ref 96–112)
Creatinine, Ser: 0.7 mg/dL (ref 0.40–1.20)
GFR: 88.19 mL/min (ref >60.00)
Glucose, Bld: 96 mg/dL (ref 70–99)
Potassium: 3.8 mEq/L (ref 3.5–5.1)
Sodium: 140 mEq/L (ref 135–145)

## 2015-01-12 MED ORDER — BENZONATATE 200 MG PO CAPS: ORAL_CAPSULE | ORAL | Status: DC

## 2015-01-12 MED ORDER — ALBUTEROL SULFATE HFA 108 (90 BASE) MCG/ACT IN AERS: 2.0000 | INHALATION_SPRAY | RESPIRATORY_TRACT | Status: DC | PRN

## 2015-01-12 NOTE — Telephone Encounter (Signed)
Called and informed pt of below. E-scribed meds to [facility]. No further questions/concerns at this time. JG//CMA

## 2015-01-12 NOTE — Telephone Encounter (Signed)
-----   Message from Sheliah Hatch, MD sent at 01/12/2015 11:47 AM EDT ----- Chest xray doesn't show any pneumonia- this is great news.  It does show bronchitis changes consistent w/ a viral illness.  Based on this, please send Tessalon  TID prn cough, #60, no refills and albuterol HFA, 2 puffs every 4-6 hrs as needed for cough, #1.  If no improvement by early next week, she needs to let us know.

## 2015-01-25 ENCOUNTER — Ambulatory Visit (INDEPENDENT_AMBULATORY_CARE_PROVIDER_SITE_OTHER): Payer: Medicare Other | Admitting: Family Medicine

## 2015-01-25 ENCOUNTER — Encounter: Payer: Self-pay | Admitting: Family Medicine

## 2015-01-25 VITALS — BP 110/76 | HR 62 | Temp 97.9°F | Resp 17 | Ht 63.0 in | Wt 154.5 lb

## 2015-01-25 DIAGNOSIS — G2581 Restless legs syndrome: Secondary | ICD-10-CM

## 2015-01-25 DIAGNOSIS — R05 Cough: Secondary | ICD-10-CM | POA: Diagnosis not present

## 2015-01-25 DIAGNOSIS — R059 Cough, unspecified: Secondary | ICD-10-CM | POA: Insufficient documentation

## 2015-01-25 MED ORDER — CETIRIZINE HCL 10 MG PO TABS: 10.0000 mg | ORAL_TABLET | Freq: Every day | ORAL | Status: DC

## 2015-01-25 NOTE — Patient Instructions (Signed)
Follow up as needed Continue to use the nasal spray Start the Zyrtec daily- compare the OTC and the prescription prices Drink plenty of fluids Continue the Requip nightly for the Restless Leg Syndrome Call with any questions or concerns HAPPY BELATED BIRTHDAY!!!

## 2015-01-25 NOTE — Assessment & Plan Note (Signed)
Ongoing issue for pt.  Well appearing and denies fevers or other symptoms.  Hx of seasonal allergies and pt reports near constant PND at this point.  Suspect that her undertreated seasonal allergies are the cause of her cough.  Pt to continue nasal steroid and start daily antihistamine.  Reviewed supportive care and red flags that should prompt return.  Pt expressed understanding and is in agreement w/ plan.

## 2015-01-25 NOTE — Progress Notes (Signed)
Pre visit review using our clinic review tool, if applicable. No additional management support is needed unless otherwise documented below in the visit note. 

## 2015-01-25 NOTE — Assessment & Plan Note (Signed)
Improved since starting Requip.  Pt is now sleeping much better.  Is pleased with the results thus far.  Will continue to follow.

## 2015-01-25 NOTE — Progress Notes (Signed)
   Subjective:    Patient ID: LINN CLAVIN, female    DOB: 1945/12/14, 69 y.o.   MRN: [id number]  HPI RLS- sxs much improved since starting Requip at last visit.  Has only had 2 nights of pacing in the last 2 weeks when she was previously having 3-4 episodes weekly.  Accordingly, sleep has improved.  Pt reports less fatigue, feeling less worn out.  Cough- continues to have cough despite normal CXR.  No fevers.  Using Nasacort.  Not taking Claritin or Zyrtec.   Review of Systems For ROS see HPI     Objective:   Physical Exam  Constitutional: She appears well-developed and well-nourished. No distress.  HENT:  Head: Normocephalic and atraumatic.  Right Ear: Tympanic membrane normal.  Left Ear: Tympanic membrane normal.  Nose: Mucosal edema and rhinorrhea present. Right sinus exhibits no maxillary sinus tenderness and no frontal sinus tenderness. Left sinus exhibits no maxillary sinus tenderness and no frontal sinus tenderness.  Mouth/Throat: Mucous membranes are normal. Posterior oropharyngeal erythema (w/ PND) present.  Eyes: Conjunctivae and EOM are normal. Pupils are equal, round, and reactive to light.  Neck: Normal range of motion. Neck supple.  Cardiovascular: Normal rate, regular rhythm and normal heart sounds.   Pulmonary/Chest: Effort normal and breath sounds normal. No respiratory distress. She has no wheezes. She has no rales.  Lymphadenopathy:    She has no cervical adenopathy.  Vitals reviewed.         Assessment & Plan:

## 2015-04-09 ENCOUNTER — Ambulatory Visit (INDEPENDENT_AMBULATORY_CARE_PROVIDER_SITE_OTHER): Payer: Medicare Other | Admitting: Family Medicine

## 2015-04-09 ENCOUNTER — Encounter: Payer: Self-pay | Admitting: Family Medicine

## 2015-04-09 VITALS — BP 120/80 | HR 64 | Temp 97.9°F | Resp 17 | Wt 150.1 lb

## 2015-04-09 DIAGNOSIS — J0100 Acute maxillary sinusitis, unspecified: Secondary | ICD-10-CM

## 2015-04-09 MED ORDER — DOXYCYCLINE HYCLATE 100 MG PO TABS: 100.0000 mg | ORAL_TABLET | Freq: Two times a day (BID) | ORAL | Status: DC

## 2015-04-09 NOTE — Progress Notes (Signed)
Pre visit review using our clinic review tool, if applicable. No additional management support is needed unless otherwise documented below in the visit note. 

## 2015-04-09 NOTE — Assessment & Plan Note (Signed)
Pt's sxs and PE consistent w/ infxn.  Start abx.  Reviewed supportive care and red flags that should prompt return.  Pt expressed understanding and is in agreement w/ plan.  

## 2015-04-09 NOTE — Patient Instructions (Signed)
Follow up as needed Start the Doxycycline twice daily- take w/ food Drink plenty of fluids REST! Call with any questions or concerns If you want to join us at the new [place] office, any scheduled appointments will automatically transfer and we will see you at [address] [place], [place], [place] [address]  Hang in there!!!

## 2015-04-09 NOTE — Progress Notes (Signed)
   Subjective:    Patient ID: Melissa Jordan, female    DOB: June 21, 1946, 69 y.o. y.o.   MRN: [id number]  HPI URI- sxs started ~1 week ago.  Has been taking Zyrtec regularly.  Developed tooth pain.  Saw dentist due to tooth pain and had xrays and was told she had a sinus infxn.  No fevers.  + HA, facial pain.  + nasal congestion.  Bilateral ear fullness, L>R.  No cough.   Review of Systems For ROS see HPI     Objective:   Physical Exam  Constitutional: She appears well-developed and well-nourished. No distress.  HENT:  Head: Normocephalic and atraumatic.  Right Ear: Tympanic membrane normal.  Left Ear: Tympanic membrane normal.  Nose: Mucosal edema and rhinorrhea present. Right sinus exhibits maxillary sinus tenderness and frontal sinus tenderness. Left sinus exhibits maxillary sinus tenderness and frontal sinus tenderness.  Mouth/Throat: Uvula is midline and mucous membranes are normal. Posterior oropharyngeal erythema present. No oropharyngeal exudate.  Eyes: Conjunctivae and EOM are normal. Pupils are equal, round, and reactive to light.  Neck: Normal range of motion. Neck supple.  Cardiovascular: Normal rate, regular rhythm and normal heart sounds.   Pulmonary/Chest: Effort normal and breath sounds normal. No respiratory distress. She has no wheezes.  Lymphadenopathy:    She has no cervical adenopathy.  Vitals reviewed.         Assessment & Plan:

## 2015-04-28 ENCOUNTER — Other Ambulatory Visit: Payer: Self-pay | Admitting: Family Medicine

## 2015-04-30 NOTE — Telephone Encounter (Signed)
Medication filled to pharmacy as requested.   

## 2015-05-10 ENCOUNTER — Other Ambulatory Visit: Payer: Self-pay | Admitting: Family Medicine

## 2015-05-10 NOTE — Telephone Encounter (Signed)
Medication filled to pharmacy as requested.   

## 2015-05-16 ENCOUNTER — Encounter: Payer: Self-pay | Admitting: Behavioral Health

## 2015-05-16 ENCOUNTER — Telehealth: Payer: Self-pay | Admitting: Behavioral Health

## 2015-05-16 NOTE — Telephone Encounter (Signed)
Pre-Visit Call completed with patient and chart updated.   Pre-Visit Info documented in Specialty Comments under [employer].    

## 2015-05-21 ENCOUNTER — Ambulatory Visit (INDEPENDENT_AMBULATORY_CARE_PROVIDER_SITE_OTHER): Payer: Medicare Other | Admitting: Family Medicine

## 2015-05-21 ENCOUNTER — Encounter: Payer: Self-pay | Admitting: Family Medicine

## 2015-05-21 ENCOUNTER — Encounter: Payer: Self-pay | Admitting: General Practice

## 2015-05-21 VITALS — BP 122/80 | HR 57 | Temp 98.0°F | Resp 16 | Ht 63.0 in | Wt 149.0 lb

## 2015-05-21 DIAGNOSIS — I10 Essential (primary) hypertension: Secondary | ICD-10-CM | POA: Diagnosis not present

## 2015-05-21 DIAGNOSIS — Z23 Encounter for immunization: Secondary | ICD-10-CM | POA: Diagnosis not present

## 2015-05-21 DIAGNOSIS — Z0000 Encounter for general adult medical examination without abnormal findings: Secondary | ICD-10-CM | POA: Diagnosis not present

## 2015-05-21 DIAGNOSIS — E785 Hyperlipidemia, unspecified: Secondary | ICD-10-CM | POA: Diagnosis not present

## 2015-05-21 DIAGNOSIS — M8580 Other specified disorders of bone density and structure, unspecified site: Secondary | ICD-10-CM

## 2015-05-21 LAB — LIPID PANEL
Cholesterol: 172 mg/dL (ref 0–200)
HDL: 50.1 mg/dL (ref >39.00)
LDL Cholesterol: 102 mg/dL — ABNORMAL HIGH (ref 0–99)
NonHDL: 122.36
Total CHOL/HDL Ratio: 3
Triglycerides: 103 mg/dL (ref 0.0–149.0)
VLDL: 20.6 mg/dL (ref 0.0–40.0)

## 2015-05-21 LAB — CBC WITH DIFFERENTIAL/PLATELET
Basophils Absolute: 0 10*3/uL (ref 0.0–0.1)
Basophils Relative: 0.5 % (ref 0.0–3.0)
Eosinophils Absolute: 0.2 10*3/uL (ref 0.0–0.7)
Eosinophils Relative: 3 % (ref 0.0–5.0)
HCT: 39.6 % (ref 36.0–46.0)
Hemoglobin: 13 g/dL (ref 12.0–15.0)
Lymphocytes Relative: 22.9 % (ref 12.0–46.0)
Lymphs Abs: 1.6 10*3/uL (ref 0.7–4.0)
MCHC: 32.9 g/dL (ref 30.0–36.0)
MCV: 88.7 fl (ref 78.0–100.0)
Monocytes Absolute: 0.6 10*3/uL (ref 0.1–1.0)
Monocytes Relative: 9 % (ref 3.0–12.0)
Neutro Abs: 4.4 10*3/uL (ref 1.4–7.7)
Neutrophils Relative %: 64.6 % (ref 43.0–77.0)
Platelets: 231 10*3/uL (ref 150.0–400.0)
RBC: 4.46 Mil/uL (ref 3.87–5.11)
RDW: 13.7 % (ref 11.5–15.5)
WBC: 6.8 10*3/uL (ref 4.0–10.5)

## 2015-05-21 LAB — VITAMIN D 25 HYDROXY (VIT D DEFICIENCY, FRACTURES): VITD: 32.61 ng/mL (ref 30.00–100.00)

## 2015-05-21 LAB — HEPATIC FUNCTION PANEL
ALT: 8 U/L (ref 0–35)
AST: 12 U/L (ref 0–37)
Albumin: 3.8 g/dL (ref 3.5–5.2)
Alkaline Phosphatase: 76 U/L (ref 39–117)
Bilirubin, Direct: 0.1 mg/dL (ref 0.0–0.3)
Total Bilirubin: 0.4 mg/dL (ref 0.2–1.2)
Total Protein: 5.9 g/dL — ABNORMAL LOW (ref 6.0–8.3)

## 2015-05-21 LAB — BASIC METABOLIC PANEL
BUN: 13 mg/dL (ref 6–23)
CO2: 29 mEq/L (ref 19–32)
Calcium: 8.9 mg/dL (ref 8.4–10.5)
Chloride: 105 mEq/L (ref 96–112)
Creatinine, Ser: 0.76 mg/dL (ref 0.40–1.20)
GFR: 80.12 mL/min (ref >60.00)
Glucose, Bld: 88 mg/dL (ref 70–99)
Potassium: 3.8 mEq/L (ref 3.5–5.1)
Sodium: 140 mEq/L (ref 135–145)

## 2015-05-21 LAB — TSH: TSH: 2.89 u[IU]/mL (ref 0.35–4.50)

## 2015-05-21 MED ORDER — ESTRADIOL 1 MG PO TABS: 1.0000 mg | ORAL_TABLET | Freq: Every day | ORAL | Status: DC

## 2015-05-21 MED ORDER — METOPROLOL TARTRATE 50 MG PO TABS: ORAL_TABLET | ORAL | Status: DC

## 2015-05-21 MED ORDER — ROPINIROLE HCL 0.5 MG PO TABS: 0.5000 mg | ORAL_TABLET | Freq: Every day | ORAL | Status: DC

## 2015-05-21 NOTE — Assessment & Plan Note (Signed)
Chronic problem.  UTD on DEXA.  Taking Vit D daily.  Check labs.  Replete prn.

## 2015-05-21 NOTE — Patient Instructions (Signed)
Follow up in 1 year or as needed We'll notify you of your lab results and make any changes if needed Keep up the good work!  You look great! You are due for mammo and colonoscopy in January 2017 (already scheduled) Call with any questions or concerns If you want to join us at the new [place] office, any scheduled appointments will automatically transfer and we will see you at [address], [place], [place] [address] (OPENING 06/26/15) Happy Holidays!!

## 2015-05-21 NOTE — Assessment & Plan Note (Signed)
Chronic problem.  Well controlled.  Asymptomatic.  Check labs.  No anticipated med changes. 

## 2015-05-21 NOTE — Assessment & Plan Note (Signed)
Chronic problem.  Attempting to control w/ healthy diet and regular exercise.  Check labs.  Will continue to follow.

## 2015-05-21 NOTE — Progress Notes (Signed)
Pre visit review using our clinic review tool, if applicable. No additional management support is needed unless otherwise documented below in the visit note. 

## 2015-05-21 NOTE — Progress Notes (Signed)
   Subjective:    Patient ID: Melissa Jordan, female    DOB: [birth year], 69 y.o.   MRN: [id number]  HPI Here today for CPE.  Risk Factors: HTN- chronic problem, on Metoprolol w/ good control.   Hyperlipidemia- chronic problem, attempting to control w/ healthy diet and regular exercise.  Not currently on meds. Osteopenia- chronic problem, UTD on DEXA.  Taking Vit D daily Physical Activity: Fall Risk: low risk Depression: denies Hearing: normal to conversational tones and whispered voice ADL's: independent Cognitive: normal linear thought process, memory and attention intact Home Safety: safe at home Height, Weight, BMI, Visual Acuity: see vitals, vision corrected to 20/20 w/ glasses Counseling: UTD on mammo, DEXA.  No need for paps due to hysterectomy.  Due for colonoscopy- scheduled for January [facility]([place]).  Flu today. Care team reviewed and updated w/ pt Labs Ordered: See A&P Care Plan: See A&P    Review of Systems Patient reports no vision/ hearing changes, adenopathy,fever, weight change,  persistant/recurrent hoarseness , swallowing issues, chest pain, palpitations, edema, persistant/recurrent cough, hemoptysis, dyspnea (rest/exertional/paroxysmal nocturnal), gastrointestinal bleeding (melena, rectal bleeding), abdominal pain, significant heartburn, bowel changes, GU symptoms (dysuria, hematuria, incontinence), Gyn symptoms (abnormal  bleeding, pain),  syncope, focal weakness, memory loss, numbness & tingling, skin/hair/nail changes, abnormal bruising or bleeding, anxiety, or depression.     Objective:   Physical Exam General Appearance:    Alert, cooperative, no distress, appears stated age  Head:    Normocephalic, without obvious abnormality, atraumatic  Eyes:    PERRL, conjunctiva/corneas clear, EOM's intact, fundi    benign, both eyes  Ears:    Normal TM's and external ear canals, both ears  Nose:   Nares normal, septum midline, mucosa normal, no drainage    or sinus  tenderness  Throat:   Lips, mucosa, and tongue normal; teeth and gums normal  Neck:   Supple, symmetrical, trachea midline, no adenopathy;    Thyroid: no enlargement/tenderness/nodules  Back:     Symmetric, no curvature, ROM normal, no CVA tenderness  Lungs:     Clear to auscultation bilaterally, respirations unlabored  Chest Wall:    No tenderness or deformity   Heart:    Regular rate and rhythm, S1 and S2 normal, no murmur, rub   or gallop  Breast Exam:    Deferred to mammo  Abdomen:     Soft, non-tender, bowel sounds active all four quadrants,    no masses, no organomegaly  Genitalia:    Deferred  Rectal:    Extremities:   Extremities normal, atraumatic, no cyanosis or edema  Pulses:   2+ and symmetric all extremities  Skin:   Skin color, texture, turgor normal, no rashes or lesions  Lymph nodes:   Cervical, supraclavicular, and axillary nodes normal  Neurologic:   CNII-XII intact, normal strength, sensation and reflexes    throughout          Assessment & Plan:

## 2015-05-21 NOTE — Assessment & Plan Note (Signed)
Pt's PE WNL.  UTD on mammo, DEXA, has colonoscopy scheduled for Jan.  Written screening schedule updated and given to pt. Flu shot given.  UTD on pneumonia vaccines.  Check labs.  Anticipatory guidance provided.

## 2015-05-30 DIAGNOSIS — M47816 Spondylosis without myelopathy or radiculopathy, lumbar region: Secondary | ICD-10-CM | POA: Diagnosis not present

## 2015-05-30 DIAGNOSIS — M1990 Unspecified osteoarthritis, unspecified site: Secondary | ICD-10-CM | POA: Diagnosis not present

## 2015-05-31 ENCOUNTER — Encounter: Payer: Self-pay | Admitting: Gastroenterology

## 2015-05-31 ENCOUNTER — Other Ambulatory Visit: Payer: Self-pay

## 2015-05-31 DIAGNOSIS — Z1231 Encounter for screening mammogram for malignant neoplasm of breast: Secondary | ICD-10-CM

## 2015-06-04 ENCOUNTER — Encounter: Payer: Self-pay | Admitting: Cardiology

## 2015-06-04 ENCOUNTER — Encounter: Payer: Self-pay | Admitting: Gastroenterology

## 2015-07-03 LAB — IFOBT (OCCULT BLOOD): IFOBT: NEGATIVE

## 2015-07-12 ENCOUNTER — Encounter: Payer: Self-pay | Admitting: Family Medicine

## 2015-07-12 ENCOUNTER — Ambulatory Visit (INDEPENDENT_AMBULATORY_CARE_PROVIDER_SITE_OTHER): Payer: Medicare Other | Admitting: Family Medicine

## 2015-07-12 VITALS — BP 127/61 | HR 65 | Temp 98.0°F | Ht 63.0 in | Wt 151.0 lb

## 2015-07-12 DIAGNOSIS — M546 Pain in thoracic spine: Secondary | ICD-10-CM | POA: Insufficient documentation

## 2015-07-12 MED ORDER — TIZANIDINE HCL 4 MG PO TABS: 4.0000 mg | ORAL_TABLET | Freq: Four times a day (QID) | ORAL | Status: DC | PRN

## 2015-07-12 MED ORDER — MELOXICAM 15 MG PO TABS: 15.0000 mg | ORAL_TABLET | Freq: Every day | ORAL | Status: DC

## 2015-07-12 NOTE — Progress Notes (Signed)
   Subjective:    Patient ID: Melissa Jordan, female    DOB: 1945-08-19, 70 y.o.   MRN: [id number]  HPI Back pain- no known injury, no change in activity level.  Pt feels pain is muscular.  Now having pain w/ walking, sleeping.  sxs started as 'irritated' a couple of months ago and would improve w/ tylenol and topical rubs.  Now nothing provides relief.  Painful to take deep breath.  Pain is just above bra line on R side.  No rash present.  Pain to swing arms when walking, painful to raise arms.   Review of Systems For ROS see HPI     Objective:   Physical Exam  Constitutional: She is oriented to person, place, and time. She appears well-developed and well-nourished. No distress.  HENT:  Head: Normocephalic and atraumatic.  Neck: Normal range of motion. Neck supple.  Cardiovascular: Normal rate, regular rhythm, normal heart sounds and intact distal pulses.   Pulmonary/Chest: Effort normal and breath sounds normal. No respiratory distress. She has no wheezes. She has no rales.  Musculoskeletal: She exhibits tenderness (TTP over R lat, pain w/ raising R arm).  Neurological: She is alert and oriented to person, place, and time.  Skin: Skin is warm and dry. No rash noted. No erythema.  Psychiatric: She has a normal mood and affect. Her behavior is normal. Thought content normal.  Vitals reviewed.         Assessment & Plan:

## 2015-07-12 NOTE — Patient Instructions (Signed)
Follow up by phone middle of next week so I know how your back pain is doing Start the Tizanidine (Zanaflex) every 6 hrs as needed for spasm- may cause drowsiness Start the once daily Meloxicam for inflammation- take w/ food Alternate ice/heat for pain and spasm relief Call with any questions or concerns Hang in there!!

## 2015-07-12 NOTE — Progress Notes (Signed)
Pre visit review using our clinic review tool, if applicable. No additional management support is needed unless otherwise documented below in the visit note. 

## 2015-07-12 NOTE — Assessment & Plan Note (Addendum)
New.  Pt w/ palpable spasm over R lat and corresponding discomfort w/ raising R arm.  Pt's presentation is atypical for PE or cardiac etiology (normal HR, O2 sat).  Start scheduled NSAID and muscle relaxer prn.  Reviewed supportive care and red flags that should prompt return.  Pt expressed understanding and is in agreement w/ plan.

## 2015-07-19 ENCOUNTER — Telehealth: Payer: Self-pay | Admitting: Family Medicine

## 2015-07-19 NOTE — Telephone Encounter (Signed)
Pt says that her PCP informed her to call in to give her an update this week from her last visit. Pt says that she is feeling a little bit better than last week on her visit. Pt would like to know if she need to complete the medication that she was prescribed or should she stop taking it.

## 2015-07-20 ENCOUNTER — Ambulatory Visit
Admission: RE | Admit: 2015-07-20 | Discharge: 2015-07-20 | Disposition: A | Discharge status: Home / Self Care | Payer: Medicare Other | Source: Ambulatory Visit

## 2015-07-20 DIAGNOSIS — Z1231 Encounter for screening mammogram for malignant neoplasm of breast: Secondary | ICD-10-CM

## 2015-07-20 NOTE — Telephone Encounter (Signed)
Pt informed of provider recommendations and expressed an understanding.

## 2015-07-20 NOTE — Telephone Encounter (Signed)
If feeling better, pt can hold the mobic and the muscle relaxers or use as needed

## 2015-07-26 ENCOUNTER — Ambulatory Visit (AMBULATORY_SURGERY_CENTER): Payer: Self-pay

## 2015-07-26 VITALS — Ht 63.0 in | Wt 150.6 lb

## 2015-07-26 DIAGNOSIS — Z1211 Encounter for screening for malignant neoplasm of colon: Secondary | ICD-10-CM

## 2015-07-26 MED ORDER — SUPREP BOWEL PREP KIT 17.5-3.13-1.6 GM/177ML PO SOLN: 1.0000 | Freq: Once | ORAL | Status: DC

## 2015-07-26 NOTE — Progress Notes (Signed)
PONV with general anesthesia No allergies to eggs or soy No other past problems with anesthesia No diet/weight loss meds No home oxygen  Has email and internet; registered for emmi

## 2015-07-30 ENCOUNTER — Encounter: Payer: Self-pay | Admitting: Family Medicine

## 2015-08-06 ENCOUNTER — Other Ambulatory Visit: Payer: Self-pay | Admitting: Family Medicine

## 2015-08-07 NOTE — Telephone Encounter (Signed)
Medication filled to pharmacy as requested.   

## 2015-08-09 ENCOUNTER — Encounter: Payer: Self-pay | Admitting: Gastroenterology

## 2015-08-09 ENCOUNTER — Ambulatory Visit (AMBULATORY_SURGERY_CENTER): Payer: Medicare Other | Admitting: Gastroenterology

## 2015-08-09 VITALS — BP 156/91 | HR 72 | Temp 99.1°F | Resp 19 | Ht 63.0 in | Wt 150.0 lb

## 2015-08-09 DIAGNOSIS — Z1211 Encounter for screening for malignant neoplasm of colon: Secondary | ICD-10-CM | POA: Diagnosis present

## 2015-08-09 DIAGNOSIS — R1112 Projectile vomiting: Secondary | ICD-10-CM

## 2015-08-09 MED ORDER — ONDANSETRON 4 MG PO TBDP: 4.0000 mg | ORAL_TABLET | Freq: Four times a day (QID) | ORAL | Status: DC | PRN

## 2015-08-09 MED ORDER — ONDANSETRON 4 MG PO TBDP
4.0000 mg | ORAL_TABLET | Freq: Once | ORAL | Status: AC
  Administered 2015-08-09: 4 mg via ORAL

## 2015-08-09 MED ORDER — ONDANSETRON HCL 4 MG PO TABS: 4.0000 mg | ORAL_TABLET | Freq: Three times a day (TID) | ORAL | Status: DC | PRN

## 2015-08-09 MED ORDER — SODIUM CHLORIDE 0.9 % IV SOLN: 500.0000 mL | INTRAVENOUS | Status: DC

## 2015-08-09 MED ORDER — FAMOTIDINE 20 MG PO TABS: 20.0000 mg | ORAL_TABLET | Freq: Two times a day (BID) | ORAL | Status: DC

## 2015-08-09 NOTE — Patient Instructions (Signed)
YOU HAD AN ENDOSCOPIC PROCEDURE TODAY AT THE [facility]:   Refer to the procedure report that was given to you for any specific questions about what was found during the examination.  If the procedure report does not answer your questions, please call your gastroenterologist to clarify.  If you requested that your care partner not be given the details of your procedure findings, then the procedure report has been included in a sealed envelope for you to review at your convenience later.  YOU SHOULD EXPECT: Some feelings of bloating in the abdomen. Passage of more gas than usual.  Walking can help get rid of the air that was put into your GI tract during the procedure and reduce the bloating. If you had a lower endoscopy (such as a colonoscopy or flexible sigmoidoscopy) you may notice spotting of blood in your stool or on the toilet paper. If you underwent a bowel prep for your procedure, you may not have a normal bowel movement for a few days.  Please Note:  You might notice some irritation and congestion in your nose or some drainage.  This is from the oxygen used during your procedure.  There is no need for concern and it should clear up in a day or so.  SYMPTOMS TO REPORT IMMEDIATELY:   Following lower endoscopy (colonoscopy or flexible sigmoidoscopy):  Excessive amounts of blood in the stool  Significant tenderness or worsening of abdominal pains  Swelling of the abdomen that is new, acute  Fever of 100F or higher  For urgent or emergent issues, a gastroenterologist can be reached at any hour by calling [phone].   DIET: Your first meal following the procedure should be a small meal and then it is ok to progress to your normal diet. Heavy or fried foods are harder to digest and may make you feel nauseous or bloated.  Likewise, meals heavy in dairy and vegetables can increase bloating.  Drink plenty of fluids but you should avoid alcoholic beverages for 24  hours.  ACTIVITY:  You should plan to take it easy for the rest of today and you should NOT DRIVE or use heavy machinery until tomorrow (because of the sedation medicines used during the test).    FOLLOW UP: Our staff will call the number listed on your records the next business day following your procedure to check on you and address any questions or concerns that you may have regarding the information given to you following your procedure. If we do not reach you, we will leave a message.  However, if you are feeling well and you are not experiencing any problems, there is no need to return our call.  We will assume that you have returned to your regular daily activities without incident.  If any biopsies were taken you will be contacted by phone or by letter within the next 1-3 weeks.  Please call us at [phone] if you have not heard about the biopsies in 3 weeks.    SIGNATURES/CONFIDENTIALITY: You and/or your care partner have signed paperwork which will be entered into your electronic medical record.  These signatures attest to the fact that that the information above on your After Visit Summary has been reviewed and is understood.  Full responsibility of the confidentiality of this discharge information lies with you and/or your care-partner.  Diverticulosis/ Hemorrhoid handout given Done with Colonoscopy

## 2015-08-09 NOTE — Op Note (Signed)
[facility] [address] [place], [address]   COLONOSCOPY PROCEDURE REPORT  PATIENT: Melissa Jordan, Melissa Jordan  MR#: [id number] BIRTHDATE: 1945/09/07 , 69  yrs. old GENDER: female ENDOSCOPIST: Marsa Aris, MD REFERRED [id number] Beverely Low, M.D. PROCEDURE DATE:  08/09/2015 PROCEDURE:   Colonoscopy, screening First Screening Colonoscopy - Avg.  risk and is 50 yrs.  old or older - No.  Prior Negative Screening - Now for repeat screening. 10 or more years since last screening  History of Adenoma - Now for follow-up colonoscopy & has been > or = to 3 yrs.  N/A  Polyps removed today? No Recommend repeat exam, <10 yrs? No ASA CLASS:   Class II INDICATIONS:Screening for colonic neoplasia and Colorectal Neoplasm Risk Assessment for this procedure is average risk. MEDICATIONS: Propofol 400 mg IV  DESCRIPTION OF PROCEDURE:   After the risks benefits and alternatives of the procedure were thoroughly explained, informed consent was obtained.  The digital rectal exam revealed no abnormalities of the rectum.   The LB [id number]-[id number]  endoscope was introduced through the anus and advanced to the cecum, which was identified by both the appendix and ileocecal valve. No adverse events experienced.   Limited by a tortuous and redundant colon. The quality of the prep was good.  The instrument was then slowly withdrawn as the colon was fully examined. Estimated blood loss is zero unless otherwise noted in this procedure report.   COLON FINDINGS: There was mild diverticulosis noted in the sigmoid colon with associated tortuosity.   Small internal hemorrhoids were found.  Retroflexed views revealed internal hemorrhoids. The time to cecum = 43.7 Withdrawal time = 6.0   The scope was withdrawn and the procedure completed. COMPLICATIONS: There were no immediate complications.  ENDOSCOPIC IMPRESSION: 1.   There was mild diverticulosis noted in the sigmoid colon 2.   Small internal  hemorrhoids  RECOMMENDATIONS: Given your age, you will not need another colonoscopy for colon cancer screening or polyp surveillance.  These types of tests usually stop around the age 45.  eSigned:  Marsa Aris, MD 08/09/2015 9:14 AM

## 2015-08-09 NOTE — Progress Notes (Signed)
Patient awakening,vss,report to rn 

## 2015-08-09 NOTE — Progress Notes (Signed)
Pt c/o nausea with x2 episodes of vomiting States she has Hx n/v post sedation Given Zofran  ODT with some relief Will speak to doctor

## 2015-08-10 ENCOUNTER — Encounter: Payer: Self-pay | Admitting: Family Medicine

## 2015-08-10 ENCOUNTER — Telehealth: Payer: Self-pay

## 2015-08-10 ENCOUNTER — Ambulatory Visit (INDEPENDENT_AMBULATORY_CARE_PROVIDER_SITE_OTHER): Payer: Medicare Other | Admitting: Family Medicine

## 2015-08-10 VITALS — BP 130/76 | HR 73 | Temp 98.5°F | Ht 63.0 in | Wt 148.8 lb

## 2015-08-10 DIAGNOSIS — J0100 Acute maxillary sinusitis, unspecified: Secondary | ICD-10-CM | POA: Diagnosis not present

## 2015-08-10 MED ORDER — PROMETHAZINE-DM 6.25-15 MG/5ML PO SYRP: 5.0000 mL | ORAL_SOLUTION | Freq: Four times a day (QID) | ORAL | Status: DC | PRN

## 2015-08-10 MED ORDER — SULFAMETHOXAZOLE-TRIMETHOPRIM 800-160 MG PO TABS: 1.0000 | ORAL_TABLET | Freq: Two times a day (BID) | ORAL | Status: DC

## 2015-08-10 NOTE — Patient Instructions (Signed)
Follow up as needed Start the Bactrim twice daily- take w/ food Drink plenty of fluids REST! Cough syrup as needed- will cause drowsiness Mucinex DM for daytime cough w/o drowsiness Call with any questions or concerns Hang in there!!!

## 2015-08-10 NOTE — Progress Notes (Signed)
Pre visit review using our clinic review tool, if applicable. No additional management support is needed unless otherwise documented below in the visit note. 

## 2015-08-10 NOTE — Progress Notes (Signed)
   Subjective:    Patient ID: Melissa Jordan, female    DOB: 08-02-1945, 70 y.o.   MRN: [id number]  HPI URI- sxs started yesterday after her colonoscopy w/ nasal congestion and cough.  + SOB, sinus HA (for the last week).  Cough is now productive of thick yellow green sputum.  Tm 99.8.  Some improvement w/ nasacort, Mucinex DM, tylenol.  + laryngitis.  Pt had vomiting after her procedure yesterday.  No known sick contacts.   Review of Systems For ROS see HPI     Objective:   Physical Exam  Constitutional: She appears well-developed and well-nourished. No distress.  HENT:  Head: Normocephalic and atraumatic.  Right Ear: Tympanic membrane normal.  Left Ear: Tympanic membrane normal.  Nose: Mucosal edema and rhinorrhea present. Right sinus exhibits maxillary sinus tenderness. Right sinus exhibits no frontal sinus tenderness. Left sinus exhibits maxillary sinus tenderness. Left sinus exhibits no frontal sinus tenderness.  Mouth/Throat: Uvula is midline and mucous membranes are normal. Posterior oropharyngeal erythema present. No oropharyngeal exudate.  Eyes: Conjunctivae and EOM are normal. Pupils are equal, round, and reactive to light.  Neck: Normal range of motion. Neck supple.  Cardiovascular: Normal rate, regular rhythm and normal heart sounds.   Pulmonary/Chest: Effort normal and breath sounds normal. No respiratory distress. She has no wheezes.  Lymphadenopathy:    She has no cervical adenopathy.  Vitals reviewed.         Assessment & Plan:

## 2015-08-10 NOTE — Assessment & Plan Note (Addendum)
Pt's sxs and PE consistent w/ infxn.  Start abx.  Cough meds prn.  Reviewed supportive care and red flags that should prompt return.  Pt expressed understanding and is in agreement w/ plan.  

## 2015-08-10 NOTE — Telephone Encounter (Signed)
  Follow up Call-  Call back number 08/09/2015  Post procedure Call Back phone  # [phone]  Permission to leave phone message Yes     Patient questions:  Do you have a fever, pain , or abdominal swelling? No. Pain Score  0 *  Have you tolerated food without any problems? Yes.    Have you been able to return to your normal activities? Yes.    Do you have any questions about your discharge instructions: Diet   No. Medications  No. Follow up visit  No.  Do you have questions or concerns about your Care? No.  Actions: * If pain score is 4 or above: No action needed, pain <4.

## 2015-08-14 ENCOUNTER — Telehealth: Payer: Self-pay

## 2015-08-14 MED ORDER — AZITHROMYCIN 250 MG PO TABS: ORAL_TABLET | ORAL | Status: AC

## 2015-08-14 NOTE — Telephone Encounter (Signed)
Pt notified and stated an understanding. Meds filled and allergy list updated.

## 2015-08-14 NOTE — Telephone Encounter (Signed)
Patient states she started ABO Saturday. Took Sunday and Monday night she broke out in hives. States she still has symptoms she was giving ABO for. Advised patient to hold medication until I spoke with Dr. Beverely Low.

## 2015-08-14 NOTE — Telephone Encounter (Signed)
Pt is to stop Bactrim- add to allergy list Start Zpack- 2 tabs on 1st day and 1 tab day 2-5, #6

## 2015-09-04 ENCOUNTER — Other Ambulatory Visit: Payer: Self-pay | Admitting: Family Medicine

## 2015-09-04 NOTE — Telephone Encounter (Signed)
Medication filled to pharmacy as requested.   

## 2015-09-14 DIAGNOSIS — M47816 Spondylosis without myelopathy or radiculopathy, lumbar region: Secondary | ICD-10-CM | POA: Diagnosis not present

## 2015-10-09 ENCOUNTER — Other Ambulatory Visit: Payer: Self-pay | Admitting: Family Medicine

## 2015-10-09 ENCOUNTER — Encounter: Payer: Self-pay | Admitting: Physician Assistant

## 2015-10-09 ENCOUNTER — Ambulatory Visit (INDEPENDENT_AMBULATORY_CARE_PROVIDER_SITE_OTHER): Payer: Medicare Other | Admitting: Physician Assistant

## 2015-10-09 VITALS — BP 140/78 | HR 62 | Temp 98.1°F | Ht 63.0 in | Wt 155.4 lb

## 2015-10-09 DIAGNOSIS — J208 Acute bronchitis due to other specified organisms: Principal | ICD-10-CM

## 2015-10-09 DIAGNOSIS — J00 Acute nasopharyngitis [common cold]: Secondary | ICD-10-CM | POA: Diagnosis not present

## 2015-10-09 DIAGNOSIS — B9689 Other specified bacterial agents as the cause of diseases classified elsewhere: Secondary | ICD-10-CM

## 2015-10-09 MED ORDER — METHYLPREDNISOLONE 4 MG PO TBPK: ORAL_TABLET | ORAL | Status: DC

## 2015-10-09 MED ORDER — BENZONATATE 100 MG PO CAPS: 100.0000 mg | ORAL_CAPSULE | Freq: Two times a day (BID) | ORAL | Status: DC | PRN

## 2015-10-09 MED ORDER — DOXYCYCLINE HYCLATE 100 MG PO CAPS: 100.0000 mg | ORAL_CAPSULE | Freq: Two times a day (BID) | ORAL | Status: DC

## 2015-10-09 MED ORDER — ALBUTEROL SULFATE HFA 108 (90 BASE) MCG/ACT IN AERS
2.0000 | INHALATION_SPRAY | Freq: Four times a day (QID) | RESPIRATORY_TRACT | Status: DC | PRN
Start: 2015-10-09 — End: 2016-01-12

## 2015-10-09 NOTE — Progress Notes (Signed)
Pre visit review using our clinic tool,if applicable. No additional management support is needed unless otherwise documented below in the visit note.  

## 2015-10-09 NOTE — Telephone Encounter (Signed)
Medication filled to pharmacy as requested.   

## 2015-10-09 NOTE — Progress Notes (Signed)
Patient presents to clinic today c/o 2 days of cough that is productive of sputum. Endorses chest tightness and wheezing. Endorses bilateral ear pain and popping. Denies fever, chills, chest pain. Has history of asthma and seasonal allergies. Is not currently on any medications for asthma.   Past Medical History  Diagnosis Date  . Allergy   . Arrhythmia     takes Metoprolol daily  . History of bronchitis     "when I get a bad cold; not chronic; I've had it a few times" (06/28/2012)  . Joint pain   . Joint swelling   . Osteopenia   . Bruises easily   . Seasonal allergies     takes Claritin daily  . Urinary urgency   . Complication of anesthesia     "BP bottoms out after OR" (06/28/2012)  . PONV (postoperative nausea and vomiting)   . Asthma   . GERD (gastroesophageal reflux disease)     "one time; really I think it was all due to drinking aspartame" (06/28/2012)  . Chronic lower back pain   . History of stress test     30 yrs. ago- wnl  . Pneumonia     "couple times in the winters" (06/28/2012), hosp. 2002  . Neuromuscular disorder (HCC)     back related   . Osteoarthritis     back, knees  . Anemia     after last surgery in 2016    Current Outpatient Prescriptions on File Prior to Visit  Medication Sig Dispense Refill  . acetaminophen (TYLENOL) 500 MG tablet Take 1,000 mg by mouth 2 (two) times daily as needed. For pain    . aspirin 81 MG tablet Take 81 mg by mouth daily.    Marland Kitchen BIOTIN PO Take 1 capsule by mouth daily.    . cholecalciferol (VITAMIN D) 1000 UNITS tablet Take 2,000 Units by mouth daily. 2000    . clindamycin (CLEOCIN) 300 MG capsule Reported on 08/09/2015    . estradiol (ESTRACE) 1 MG tablet Take 1 tablet (1 mg total) by mouth daily. 30 tablet 6  . ferrous sulfate 325 (65 FE) MG tablet Take 325 mg by mouth daily with breakfast.    . meloxicam (MOBIC) 15 MG tablet TAKE ONE TABLET BY MOUTH ONCE DAILY 30 tablet 2  . metoprolol (LOPRESSOR) 50 MG tablet TAKE ONE TABLET  BY MOUTH TWICE DAILY 180 tablet 2  . ondansetron (ZOFRAN) 4 MG tablet Take 1 tablet (4 mg total) by mouth every 8 (eight) hours as needed for nausea or vomiting. 30 tablet 1  . promethazine-dextromethorphan (PROMETHAZINE-DM) 6.25-15 MG/5ML syrup Take 5 mLs by mouth 4 (four) times daily as needed. 240 mL 0  . rOPINIRole (REQUIP) 0.5 MG tablet Take 1 tablet (0.5 mg total) by mouth at bedtime. 30 tablet 6  . tiZANidine (ZANAFLEX) 4 MG tablet TAKE ONE TABLET BY MOUTH EVERY 6 HOURS AS NEEDED FOR MUSCLE SPASMS 45 tablet 0  . CALCIUM-VITAMIN D PO Take by mouth.    . famotidine (PEPCID) 20 MG tablet Take 1 tablet (20 mg total) by mouth 2 (two) times daily. (Patient not taking: Reported on 10/09/2015) 60 tablet 3   No current facility-administered medications on file prior to visit.    Allergies  Allergen Reactions  . Bactrim [Sulfamethoxazole-Trimethoprim] Rash  . Lyrica [Pregabalin] Other (See Comments)    "thought I was going to have a heart attack; heart started pounding so hard, I couldn't catch my breath" (06/28/2012)  . Meclizine Other (  See Comments)    "what was in my mind to say wasn't what came out to say; I was kind of in another world" (06/28/2012)  . Morphine Sulfate Rash  . Penicillins Itching and Rash  . Codeine Nausea Only  . Morphine Rash  . Propoxyphene N-Acetaminophen Nausea And Vomiting    "Darvocet"    Family History  Problem Relation Age of Onset  . Alcohol abuse      fhx  . Diabetes      fhx  . Hypertension      fhx  . Stroke      fhx  . Heart disease      fhx  . Asthma      fhx  . COPD Mother   . Heart disease Father   . Colon cancer Neg Hx     Social History   Social History  . Marital Status: Married    Spouse Name: N/A  . Number of Children: N/A  . Years of Education: N/A   Social History Main Topics  . Smoking status: Never Smoker   . Smokeless tobacco: Never Used  . Alcohol Use: No  . Drug Use: No  . Sexual Activity: Not Currently    Birth  Control/ Protection: Surgical   Other Topics Concern  . None   Social History Narrative   Review of Systems - See HPI.  All other ROS are negative.  BP 140/78 mmHg  Pulse 62  Temp(Src) 98.1 F (36.7 C) (Oral)  Ht 5\' 3"  (1.6 m)  Wt 155 lb 6.4 oz (70.489 kg)  BMI 27.53 kg/m2  SpO2 99%  Physical Exam  Constitutional: She is oriented to person, place, and time and well-developed, well-nourished, and in no distress.  HENT:  Head: Normocephalic and atraumatic.  Eyes: Conjunctivae are normal.  Neck: Neck supple.  Cardiovascular: Normal rate, regular rhythm, normal heart sounds and intact distal pulses.   Pulmonary/Chest: Effort normal. No respiratory distress. She has wheezes. She has no rales. She exhibits no tenderness.  Lymphadenopathy:    She has no cervical adenopathy.  Neurological: She is alert and oriented to person, place, and time.  Skin: Skin is warm and dry. No rash noted.  Psychiatric: Affect normal.  Vitals reviewed.    Assessment/Plan: 1. Acute bacterial bronchitis Will start antibiotics giving severity and history of reactive airway disease. Will begin medrol dose pack. Tessalon per orders. Supportive measures reviewed. - doxycycline (VIBRAMYCIN) 100 MG capsule; Take 1 capsule (100 mg total) by mouth 2 (two) times daily.  Dispense: 14 capsule; Refill: 0 - methylPREDNISolone (MEDROL DOSEPAK) 4 MG TBPK tablet; Take following package directions  Dispense: 21 tablet; Refill: 0 - benzonatate (TESSALON) 100 MG capsule; Take 1 capsule (100 mg total) by mouth 2 (two) times daily as needed for cough.  Dispense: 20 capsule; Refill: 0

## 2015-10-09 NOTE — Patient Instructions (Signed)
Take antibiotic (Doxycycline) as directed.  Increase fluids.  Get plenty of rest. Use Mucinex for congestion. Use the steroid and cough medication as directed.  Restart albuterol inhaler. Take a daily probiotic (I recommend Align or Culturelle, but even Activia Yogurt may be beneficial).  A humidifier placed in the bedroom may offer some relief for a dry, scratchy throat of nasal irritation.  Read information below on acute bronchitis. Please call or return to clinic if symptoms are not improving.  Acute Bronchitis Bronchitis is when the airways that extend from the windpipe into the lungs get red, puffy, and painful (inflamed). Bronchitis often causes thick spit (mucus) to develop. This leads to a cough. A cough is the most common symptom of bronchitis. In acute bronchitis, the condition usually begins suddenly and goes away over time (usually in 2 weeks). Smoking, allergies, and asthma can make bronchitis worse. Repeated episodes of bronchitis may cause more lung problems.  HOME CARE  Rest.  Drink enough fluids to keep your pee (urine) clear or pale yellow (unless you need to limit fluids as told by your doctor).  Only take over-the-counter or prescription medicines as told by your doctor.  Avoid smoking and secondhand smoke. These can make bronchitis worse. If you are a smoker, think about using nicotine gum or skin patches. Quitting smoking will help your lungs heal faster.  Reduce the chance of getting bronchitis again by:  Washing your hands often.  Avoiding people with cold symptoms.  Trying not to touch your hands to your mouth, nose, or eyes.  Follow up with your doctor as told.  GET HELP IF: Your symptoms do not improve after 1 week of treatment. Symptoms include:  Cough.  Fever.  Coughing up thick spit.  Body aches.  Chest congestion.  Chills.  Shortness of breath.  Sore throat.  GET HELP RIGHT AWAY IF:   You have an increased fever.  You have  chills.  You have severe shortness of breath.  You have bloody thick spit (sputum).  You throw up (vomit) often.  You lose too much body fluid (dehydration).  You have a severe headache.  You faint.  MAKE SURE YOU:   Understand these instructions.  Will watch your condition.  Will get help right away if you are not doing well or get worse. Document Released: 11/26/2007 Document Revised: 02/09/2013 Document Reviewed: 11/30/2012 [facility] Patient Information 2015 [employer] [place]. This information is not intended to replace advice given to you by your health care provider. Make sure you discuss any questions you have with your health care provider.

## 2015-10-11 ENCOUNTER — Other Ambulatory Visit: Payer: Self-pay | Admitting: Neurosurgery

## 2015-10-11 DIAGNOSIS — M47816 Spondylosis without myelopathy or radiculopathy, lumbar region: Secondary | ICD-10-CM

## 2015-10-15 ENCOUNTER — Ambulatory Visit
Admission: RE | Admit: 2015-10-15 | Discharge: 2015-10-15 | Disposition: A | Discharge status: Home / Self Care | Payer: Medicare Other | Source: Ambulatory Visit | Attending: Neurosurgery | Admitting: Neurosurgery

## 2015-10-15 DIAGNOSIS — M4806 Spinal stenosis, lumbar region: Secondary | ICD-10-CM | POA: Diagnosis not present

## 2015-10-15 DIAGNOSIS — M47816 Spondylosis without myelopathy or radiculopathy, lumbar region: Secondary | ICD-10-CM

## 2015-10-23 DIAGNOSIS — M1990 Unspecified osteoarthritis, unspecified site: Secondary | ICD-10-CM | POA: Diagnosis not present

## 2015-10-23 DIAGNOSIS — M47816 Spondylosis without myelopathy or radiculopathy, lumbar region: Secondary | ICD-10-CM | POA: Diagnosis not present

## 2015-12-18 DIAGNOSIS — M25462 Effusion, left knee: Secondary | ICD-10-CM | POA: Diagnosis not present

## 2015-12-18 DIAGNOSIS — Z96652 Presence of left artificial knee joint: Secondary | ICD-10-CM | POA: Diagnosis not present

## 2015-12-18 DIAGNOSIS — M25562 Pain in left knee: Secondary | ICD-10-CM | POA: Diagnosis not present

## 2015-12-21 DIAGNOSIS — H18211 Corneal edema secondary to contact lens, right eye: Secondary | ICD-10-CM | POA: Diagnosis not present

## 2015-12-26 DIAGNOSIS — H18231 Secondary corneal edema, right eye: Secondary | ICD-10-CM | POA: Diagnosis not present

## 2015-12-27 ENCOUNTER — Other Ambulatory Visit: Payer: Self-pay | Admitting: Family Medicine

## 2015-12-27 NOTE — Telephone Encounter (Signed)
Last OV 08/10/15 meloxicam last filled 10/09/15 #30 with 2

## 2016-01-12 ENCOUNTER — Ambulatory Visit (INDEPENDENT_AMBULATORY_CARE_PROVIDER_SITE_OTHER): Payer: Medicare Other | Admitting: Family Medicine

## 2016-01-12 VITALS — BP 150/74 | HR 68 | Temp 98.3°F | Resp 16 | Ht 64.0 in | Wt 152.6 lb

## 2016-01-12 DIAGNOSIS — L255 Unspecified contact dermatitis due to plants, except food: Secondary | ICD-10-CM

## 2016-01-12 MED ORDER — TRIAMCINOLONE ACETONIDE 0.1 % EX CREA: 1.0000 "application " | TOPICAL_CREAM | Freq: Four times a day (QID) | CUTANEOUS | Status: DC

## 2016-01-12 NOTE — Progress Notes (Signed)
Subjective:    Patient ID: Melissa Jordan, female    DOB: 1945-12-19, 70 y.o.   MRN: [id number] By signing my name below, I, Melissa Jordan, attest that this documentation has been prepared under the direction and in the presence of Norberto Sorenson, MD. Electronically Signed: Javier Jordan, ER Scribe. 01/12/2016. 9:35 AM.  Chief Complaint  Patient presents with  . Rash    Facial, after working in the yard    HPI HPI Comments: Melissa Jordan is a 70 y.o. female who presents to [facility] complaining of a rash that started 3 days ago after she wiped herself with a rag that was dirty. Several hours later she developed a pruritic rash. She has been using topical cortisone and benadryl. The benedryl has effectively treated her itching.    Past Medical History  Diagnosis Date  . Allergy   . Arrhythmia     takes Metoprolol daily  . History of bronchitis     "when I get a bad cold; not chronic; I've had it a few times" (06/28/2012)  . Joint pain   . Joint swelling   . Osteopenia   . Bruises easily   . Seasonal allergies     takes Claritin daily  . Urinary urgency   . Complication of anesthesia     "BP bottoms out after OR" (06/28/2012)  . PONV (postoperative nausea and vomiting)   . Asthma   . GERD (gastroesophageal reflux disease)     "one time; really I think it was all due to drinking aspartame" (06/28/2012)  . Chronic lower back pain   . History of stress test     30 yrs. ago- wnl  . Pneumonia     "couple times in the winters" (06/28/2012), hosp. 2002  . Neuromuscular disorder (HCC)     back related   . Osteoarthritis     back, knees  . Anemia     after last surgery in 2016   Allergies  Allergen Reactions  . Bactrim [Sulfamethoxazole-Trimethoprim] Rash  . Lyrica [Pregabalin] Other (See Comments)    "thought I was going to have a heart attack; heart started pounding so hard, I couldn't catch my breath" (06/28/2012)  . Meclizine Other (See Comments)    "what was in my mind to say  wasn't what came out to say; I was kind of in another world" (06/28/2012)  . Morphine Sulfate Rash  . Penicillins Itching and Rash  . Codeine Nausea Only  . Morphine Rash  . Propoxyphene N-Acetaminophen Nausea And Vomiting    "Darvocet"   Current Outpatient Prescriptions on File Prior to Visit  Medication Sig Dispense Refill  . acetaminophen (TYLENOL) 500 MG tablet Take 1,000 mg by mouth 2 (two) times daily as needed. For pain    . aspirin 81 MG tablet Take 81 mg by mouth daily.    Marland Kitchen BIOTIN PO Take 1 capsule by mouth daily.    Marland Kitchen CALCIUM-VITAMIN D PO Take by mouth.    . cholecalciferol (VITAMIN D) 1000 UNITS tablet Take 2,000 Units by mouth daily. 2000    . estradiol (ESTRACE) 1 MG tablet Take 1 tablet (1 mg total) by mouth daily. 30 tablet 6  . fluticasone (FLONASE) 50 MCG/ACT nasal spray Place into both nostrils daily.    . meloxicam (MOBIC) 15 MG tablet TAKE ONE TABLET BY MOUTH ONCE DAILY 30 tablet 0  . metoprolol (LOPRESSOR) 50 MG tablet TAKE ONE TABLET BY MOUTH TWICE DAILY 180 tablet 2  .  rOPINIRole (REQUIP) 0.5 MG tablet Take 1 tablet (0.5 mg total) by mouth at bedtime. 30 tablet 6   No current facility-administered medications on file prior to visit.    Review of Systems  Constitutional: Negative for fever and chills.  Respiratory: Negative for cough and chest tightness.   Skin: Positive for color change and rash.  Psychiatric/Behavioral: Positive for sleep disturbance.      Objective:  BP 150/74 mmHg  Pulse 68  Temp(Src) 98.3 F (36.8 C) (Oral)  Resp 16  Ht  (1.626 m)  Wt 152 lb 9.6 oz (69.219 kg)  BMI 26.18 kg/m2  SpO2 99%  Physical Exam  Constitutional: She is oriented to person, place, and time. She appears well-developed and well-nourished. No distress.  HENT:  Head: Normocephalic and atraumatic.  Eyes: Pupils are equal, round, and reactive to light.  Neck: Neck supple.  Cardiovascular: Normal rate.   Pulmonary/Chest: Effort normal. No respiratory  distress.  Musculoskeletal: Normal range of motion.  Neurological: She is alert and oriented to person, place, and time. Coordination normal.  Skin: Skin is warm and dry. She is not diaphoretic.  Patchy mildly erythematous macular papular poorly defined rash. Rash is spread behind neck, chest, behind ears and some on upper cheeks.   Psychiatric: She has a normal mood and affect. Her behavior is normal.  Nursing note and vitals reviewed.     Assessment & Plan:   1. Rhus dermatitis   Use sparingly on face. Warned of side effects of overuse of steroid cream. Ice to eliminate pruritis. May take 10-12d for rash to fully resolve.  Meds ordered this encounter  Medications  . triamcinolone cream (KENALOG) 0.1 %    Sig: Apply 1 application topically 4 (four) times daily.    Dispense:  453.6 g    Refill:  0    I personally performed the services described in this documentation, which was scribed in my presence. The recorded information has been reviewed and considered, and addended by me as needed.   Norberto Sorenson, M.D.  [facility] [address] [place], [place] [address] [phone] phone [phone] fax  01/12/2016 11:56 AM

## 2016-01-12 NOTE — Patient Instructions (Addendum)
     IF you received an x-ray today, you will receive an invoice from [facility]. Please contact [facility] at [phone] with questions or concerns regarding your invoice.   IF you received labwork today, you will receive an invoice from [facility]. Please contact [facility] at [phone] with questions or concerns regarding your invoice.   Our billing staff will not be able to assist you with questions regarding bills from these companies.  You will be contacted with the lab results as soon as they are available. The fastest way to get your results is to activate your My Chart account. Instructions are located on the last page of this paperwork. If you have not heard from us regarding the results in 2 weeks, please contact this office.     Poison Ivy Poison ivy is a inflammation of the skin (contact dermatitis) caused by touching the allergens on the leaves of the ivy plant following previous exposure to the plant. The rash usually appears 48 hours after exposure. The rash is usually bumps (papules) or blisters (vesicles) in a linear pattern. Depending on your own sensitivity, the rash may simply cause redness and itching, or it may also progress to blisters which may break open. These must be well cared for to prevent secondary bacterial (germ) infection, followed by scarring. Keep any open areas dry, clean, dressed, and covered with an antibacterial ointment if needed. The eyes may also get puffy. The puffiness is worst in the morning and gets better as the day progresses. This dermatitis usually heals without scarring, within 2 to 3 weeks without treatment. HOME CARE INSTRUCTIONS  Thoroughly wash with soap and water as soon as you have been exposed to poison ivy. You have about one half hour to remove the plant resin before it will cause the rash. This washing will destroy the oil or antigen on the skin that is causing, or will cause, the rash.  Be sure to wash under your fingernails as any plant resin there will continue to spread the rash. Do not rub skin vigorously when washing affected area. Poison ivy cannot spread if no oil from the plant remains on your body. A rash that has progressed to weeping sores will not spread the rash unless you have not washed thoroughly. It is also important to wash any clothes you have been wearing as these may carry active allergens. The rash will return if you wear the unwashed clothing, even several days later. Avoidance of the plant in the future is the best measure. Poison ivy plant can be recognized by the number of leaves. Generally, poison ivy has three leaves with flowering branches on a single stem. Diphenhydramine may be purchased over the counter and used as needed for itching. Do not drive with this medication if it makes you drowsy.Ask your caregiver about medication for children. SEEK MEDICAL CARE IF:  Open sores develop.  Redness spreads beyond area of rash.  You notice purulent (pus-like) discharge.  You have increased pain.  Other signs of infection develop (such as fever).   This information is not intended to replace advice given to you by your health care provider. Make sure you discuss any questions you have with your health care provider.   Document Released: 06/06/2000 Document Revised: 09/01/2011 Document Reviewed: 11/15/2014 Elsevier Interactive Patient Education 2016 Elsevier Inc.  

## 2016-01-14 ENCOUNTER — Encounter: Payer: Self-pay | Admitting: Physician Assistant

## 2016-01-14 ENCOUNTER — Ambulatory Visit (INDEPENDENT_AMBULATORY_CARE_PROVIDER_SITE_OTHER): Payer: Medicare Other | Admitting: Physician Assistant

## 2016-01-14 VITALS — BP 156/72 | HR 64 | Temp 98.3°F | Resp 16 | Ht 64.0 in | Wt 150.5 lb

## 2016-01-14 DIAGNOSIS — L255 Unspecified contact dermatitis due to plants, except food: Secondary | ICD-10-CM

## 2016-01-14 MED ORDER — METHYLPREDNISOLONE 4 MG PO TBPK: ORAL_TABLET | ORAL | 0 refills | Status: DC

## 2016-01-14 NOTE — Progress Notes (Signed)
Patient presents to clinic today c/o burning and pruritic rash over her upper body, neck and face x 5 days. Notes some mild blistering of chest. Denies fever, chills, malaise. Endorses working in the yard prior to symptoms and may have come in contact with poison ivy/sumac. Notes allergy to rhus plants. Did present to [facility] this weekend with similar symptoms. Was placed on Kenalog ointment QID for rash. Endorses rash has spread to bilateral shoulders and scalp. Denies new symptoms.   Past Medical History:  Diagnosis Date  . Allergy   . Anemia    after last surgery in 2016  . Arrhythmia    takes Metoprolol daily  . Asthma   . Bruises easily   . Chronic lower back pain   . Complication of anesthesia    "BP bottoms out after OR" (06/28/2012)  . GERD (gastroesophageal reflux disease)    "one time; really I think it was all due to drinking aspartame" (06/28/2012)  . History of bronchitis    "when I get a bad cold; not chronic; I've had it a few times" (06/28/2012)  . History of stress test    30 yrs. ago- wnl  . Joint pain   . Joint swelling   . Neuromuscular disorder (HCC)    back related   . Osteoarthritis    back, knees  . Osteopenia   . Pneumonia    "couple times in the winters" (06/28/2012), hosp. 2002  . PONV (postoperative nausea and vomiting)   . Seasonal allergies    takes Claritin daily  . Urinary urgency     Current Outpatient Prescriptions on File Prior to Visit  Medication Sig Dispense Refill  . acetaminophen (TYLENOL) 500 MG tablet Take 1,000 mg by mouth 2 (two) times daily as needed. For pain    . aspirin 81 MG tablet Take 81 mg by mouth daily.    Marland Kitchen BIOTIN PO Take 1 capsule by mouth daily.    Marland Kitchen CALCIUM-VITAMIN D PO Take by mouth.    . cholecalciferol (VITAMIN D) 1000 UNITS tablet Take 2,000 Units by mouth daily. 2000    . estradiol (ESTRACE) 1 MG tablet Take 1 tablet (1 mg total) by mouth daily. 30 tablet 6  . fluticasone (FLONASE) 50 MCG/ACT nasal spray Place into  both nostrils daily.    . meloxicam (MOBIC) 15 MG tablet TAKE ONE TABLET BY MOUTH ONCE DAILY 30 tablet 0  . metoprolol (LOPRESSOR) 50 MG tablet TAKE ONE TABLET BY MOUTH TWICE DAILY 180 tablet 2  . rOPINIRole (REQUIP) 0.5 MG tablet Take 1 tablet (0.5 mg total) by mouth at bedtime. 30 tablet 6  . triamcinolone cream (KENALOG) 0.1 % Apply 1 application topically 4 (four) times daily. 453.6 g 0   No current facility-administered medications on file prior to visit.     Allergies  Allergen Reactions  . Bactrim [Sulfamethoxazole-Trimethoprim] Rash  . Lyrica [Pregabalin] Other (See Comments)    "thought I was going to have a heart attack; heart started pounding so hard, I couldn't catch my breath" (06/28/2012)  . Meclizine Other (See Comments)    "what was in my mind to say wasn't what came out to say; I was kind of in another world" (06/28/2012)  . Morphine Sulfate Rash  . Penicillins Itching and Rash  . Codeine Nausea Only  . Morphine Rash  . Propoxyphene N-Acetaminophen Nausea And Vomiting    "Darvocet"    Family History  Problem Relation Age of Onset  . Alcohol  abuse      fhx  . Diabetes      fhx  . Hypertension      fhx  . Stroke      fhx  . Heart disease      fhx  . Asthma      fhx  . COPD Mother   . Heart disease Father   . Colon cancer Neg Hx   . Stroke Paternal Grandmother   . Cancer Paternal Grandfather     Social History   Social History  . Marital status: Married    Spouse name: N/A  . Number of children: N/A  . Years of education: N/A   Social History Main Topics  . Smoking status: Never Smoker  . Smokeless tobacco: Never Used  . Alcohol use No  . Drug use: No  . Sexual activity: Not Currently    Birth control/ protection: Surgical   Other Topics Concern  . Not on file   Social History Narrative  . No narrative on file   Review of Systems - See HPI.  All other ROS are negative.  There were no vitals taken for this visit.  Physical Exam    Constitutional: She is oriented to person, place, and time and well-developed, well-nourished, and in no distress.  HENT:  Head: Normocephalic and atraumatic.  Eyes: Conjunctivae are normal.  Cardiovascular: Normal rate, regular rhythm, normal heart sounds and intact distal pulses.   Pulmonary/Chest: Effort normal and breath sounds normal. No respiratory distress. She has no wheezes. She has no rales. She exhibits no tenderness.  Neurological: She is alert and oriented to person, place, and time.  Skin: Skin is warm and dry. Rash noted. Rash is maculopapular.     Vitals reviewed.   No results found for this or any previous visit (from the past 2160 hour(s)).  Assessment/Plan: 1. Rhus dermatitis Rx Medrol pack. Continue Kenalog. OTC medications and supportive measures reviewed. FU PRN if symptoms are not resolving.  - methylPREDNISolone (MEDROL DOSEPAK) 4 MG TBPK tablet; Take following package directions  Dispense: 21 tablet; Refill: 0   Piedad Climes, [place]

## 2016-01-14 NOTE — Patient Instructions (Signed)
Please take the steroid pack as directed. Avoid heat. Apply cool compresses to the area. Sarna lotion over-the-counter will also help with itch. You can continue the topical steroid but make sure to avoid using on the face.  Follow-up if symptoms are not resolving.  Your BP was elevated when you first came in. On recheck it was improved but still slightly high. I believe this is just due to itch and burning rash, but I want you to follow the diet below for BP and follow-up with Dr. Beverely Low as scheduled for routine follow-ups so she can monitor.  DASH Eating Plan DASH stands for "Dietary Approaches to Stop Hypertension." The DASH eating plan is a healthy eating plan that has been shown to reduce high blood pressure (hypertension). Additional health benefits may include reducing the risk of type 2 diabetes mellitus, heart disease, and stroke. The DASH eating plan may also help with weight loss. WHAT DO I NEED TO KNOW ABOUT THE DASH EATING PLAN? For the DASH eating plan, you will follow these general guidelines:  Choose foods with a percent daily value for sodium of less than 5% (as listed on the food label).  Use salt-free seasonings or herbs instead of table salt or sea salt.  Check with your health care provider or pharmacist before using salt substitutes.  Eat lower-sodium products, often labeled as "lower sodium" or "no salt added."  Eat fresh foods.  Eat more vegetables, fruits, and low-fat dairy products.  Choose whole grains. Look for the word "whole" as the first word in the ingredient list.  Choose fish and skinless chicken or [place] more often than red meat. Limit fish, poultry, and meat to 6 oz (170 g) each day.  Limit sweets, desserts, sugars, and sugary drinks.  Choose heart-healthy fats.  Limit cheese to 1 oz (28 g) per day.  Eat more home-cooked food and less restaurant, buffet, and fast food.  Limit fried foods.  Cook foods using methods other than  frying.  Limit canned vegetables. If you do use them, rinse them well to decrease the sodium.  When eating at a restaurant, ask that your food be prepared with less salt, or no salt if possible. WHAT FOODS CAN I EAT? Seek help from a dietitian for individual calorie needs. Grains Whole grain or whole wheat bread. Brown rice. Whole grain or whole wheat pasta. Quinoa, bulgur, and whole grain cereals. Low-sodium cereals. Corn or whole wheat flour tortillas. Whole grain cornbread. Whole grain crackers. Low-sodium crackers. Vegetables Fresh or frozen vegetables (raw, steamed, roasted, or grilled). Low-sodium or reduced-sodium tomato and vegetable juices. Low-sodium or reduced-sodium tomato sauce and paste. Low-sodium or reduced-sodium canned vegetables.  Fruits All fresh, canned (in natural juice), or frozen fruits. Meat and Other Protein Products Ground beef (85% or leaner), grass-fed beef, or beef trimmed of fat. Skinless chicken or [place]. Ground chicken or [place]. Pork trimmed of fat. All fish and seafood. Eggs. Dried beans, peas, or lentils. Unsalted nuts and seeds. Unsalted canned beans. Dairy Low-fat dairy products, such as skim or 1% milk, 2% or reduced-fat cheeses, low-fat ricotta or cottage cheese, or plain low-fat yogurt. Low-sodium or reduced-sodium cheeses. Fats and Oils Tub margarines without trans fats. Light or reduced-fat mayonnaise and salad dressings (reduced sodium). Avocado. Safflower, olive, or canola oils. Natural peanut or almond butter. Other Unsalted popcorn and pretzels. The items listed above may not be a complete list of recommended foods or beverages. Contact your dietitian for more options. WHAT FOODS ARE NOT RECOMMENDED?  Grains White bread. White pasta. White rice. Refined cornbread. Bagels and croissants. Crackers that contain trans fat. Vegetables Creamed or fried vegetables. Vegetables in a cheese sauce. Regular canned vegetables. Regular canned tomato sauce  and paste. Regular tomato and vegetable juices. Fruits Dried fruits. Canned fruit in light or heavy syrup. Fruit juice. Meat and Other Protein Products Fatty cuts of meat. Ribs, chicken wings, bacon, sausage, bologna, salami, chitterlings, fatback, hot dogs, bratwurst, and packaged luncheon meats. Salted nuts and seeds. Canned beans with salt. Dairy Whole or 2% milk, cream, half-and-half, and cream cheese. Whole-fat or sweetened yogurt. Full-fat cheeses or blue cheese. Nondairy creamers and whipped toppings. Processed cheese, cheese spreads, or cheese curds. Condiments Onion and garlic salt, seasoned salt, table salt, and sea salt. Canned and packaged gravies. Worcestershire sauce. Tartar sauce. Barbecue sauce. Teriyaki sauce. Soy sauce, including reduced sodium. Steak sauce. Fish sauce. Oyster sauce. Cocktail sauce. Horseradish. Ketchup and mustard. Meat flavorings and tenderizers. Bouillon cubes. Hot sauce. Tabasco sauce. Marinades. Taco seasonings. Relishes. Fats and Oils Butter, stick margarine, lard, shortening, ghee, and bacon fat. Coconut, palm kernel, or palm oils. Regular salad dressings. Other Pickles and olives. Salted popcorn and pretzels. The items listed above may not be a complete list of foods and beverages to avoid. Contact your dietitian for more information. WHERE CAN I FIND MORE INFORMATION? National Heart, Lung, and Blood Institute: [employer]   This information is not intended to replace advice given to you by your health care provider. Make sure you discuss any questions you have with your health care provider.   Document Released: 05/29/2011 Document Revised: 06/30/2014 Document Reviewed: 04/13/2013 Elsevier Interactive Patient Education [employer]

## 2016-01-15 DIAGNOSIS — M25562 Pain in left knee: Secondary | ICD-10-CM | POA: Diagnosis not present

## 2016-01-30 DIAGNOSIS — S83272A Complex tear of lateral meniscus, current injury, left knee, initial encounter: Secondary | ICD-10-CM | POA: Diagnosis not present

## 2016-01-30 DIAGNOSIS — M1712 Unilateral primary osteoarthritis, left knee: Secondary | ICD-10-CM | POA: Diagnosis not present

## 2016-01-30 DIAGNOSIS — M94262 Chondromalacia, left knee: Secondary | ICD-10-CM | POA: Diagnosis not present

## 2016-02-05 DIAGNOSIS — M25662 Stiffness of left knee, not elsewhere classified: Secondary | ICD-10-CM | POA: Diagnosis not present

## 2016-02-05 DIAGNOSIS — Z9889 Other specified postprocedural states: Secondary | ICD-10-CM | POA: Diagnosis not present

## 2016-02-05 DIAGNOSIS — M25562 Pain in left knee: Secondary | ICD-10-CM | POA: Diagnosis not present

## 2016-02-06 ENCOUNTER — Other Ambulatory Visit: Payer: Self-pay | Admitting: Family Medicine

## 2016-02-27 ENCOUNTER — Other Ambulatory Visit: Payer: Self-pay | Admitting: Family Medicine

## 2016-03-11 ENCOUNTER — Other Ambulatory Visit: Payer: Self-pay | Admitting: Family Medicine

## 2016-03-11 DIAGNOSIS — G8929 Other chronic pain: Secondary | ICD-10-CM | POA: Diagnosis not present

## 2016-03-11 DIAGNOSIS — M1712 Unilateral primary osteoarthritis, left knee: Secondary | ICD-10-CM | POA: Diagnosis not present

## 2016-03-11 DIAGNOSIS — Z96652 Presence of left artificial knee joint: Secondary | ICD-10-CM | POA: Diagnosis not present

## 2016-03-11 DIAGNOSIS — Z9889 Other specified postprocedural states: Secondary | ICD-10-CM | POA: Diagnosis not present

## 2016-03-17 ENCOUNTER — Encounter: Payer: Self-pay | Admitting: General Practice

## 2016-03-17 ENCOUNTER — Ambulatory Visit (INDEPENDENT_AMBULATORY_CARE_PROVIDER_SITE_OTHER): Payer: Medicare Other | Admitting: Family Medicine

## 2016-03-17 ENCOUNTER — Encounter: Payer: Self-pay | Admitting: Family Medicine

## 2016-03-17 VITALS — BP 130/68 | HR 62 | Temp 98.1°F | Resp 16 | Ht 64.0 in | Wt 153.1 lb

## 2016-03-17 DIAGNOSIS — J0100 Acute maxillary sinusitis, unspecified: Secondary | ICD-10-CM | POA: Diagnosis not present

## 2016-03-17 DIAGNOSIS — J9801 Acute bronchospasm: Secondary | ICD-10-CM | POA: Diagnosis not present

## 2016-03-17 MED ORDER — PROMETHAZINE-DM 6.25-15 MG/5ML PO SYRP: 5.0000 mL | ORAL_SOLUTION | Freq: Four times a day (QID) | ORAL | 0 refills | Status: DC | PRN

## 2016-03-17 MED ORDER — DOXYCYCLINE HYCLATE 100 MG PO TABS: 100.0000 mg | ORAL_TABLET | Freq: Two times a day (BID) | ORAL | 0 refills | Status: DC

## 2016-03-17 MED ORDER — IPRATROPIUM-ALBUTEROL 0.5-2.5 (3) MG/3ML IN SOLN: 3.0000 mL | Freq: Once | RESPIRATORY_TRACT | Status: DC

## 2016-03-17 MED ORDER — ALBUTEROL SULFATE (2.5 MG/3ML) 0.083% IN NEBU
2.5000 mg | INHALATION_SOLUTION | Freq: Once | RESPIRATORY_TRACT | Status: AC
  Administered 2016-03-17: 2.5 mg via RESPIRATORY_TRACT

## 2016-03-17 NOTE — Progress Notes (Signed)
Pre visit review using our clinic review tool, if applicable. No additional management support is needed unless otherwise documented below in the visit note. 

## 2016-03-17 NOTE — Patient Instructions (Signed)
Follow up as needed Start the Doxycycline twice daily- take w/ food- for the sinus infection and bronchitis Use the cough syrup as needed- will cause drowsiness Mucinex DM for daytime cough w/o drowsiness Drink plenty of fluids REST! Call with any questions or concerns Hang in there!!!

## 2016-03-17 NOTE — Progress Notes (Signed)
   Subjective:    Patient ID: Melissa SenegalDonna H Tellez, female    DOB: [birth year], 70 y.o.   MRN: [id number]  HPI URI- sxs started ~2 weeks ago, 'when my ears started hurting and I started barking like a dog it was time to come in'.  sxs started w/ nasal congestion and sinus pressure.  Took Zyrtec and Mucinex w/o improvement.  L ear pain.  + rattling in the chest w/ tightness.  No fevers.  No known sick contacts.  No tooth pain.   Review of Systems For ROS see HPI     Objective:   Physical Exam  Constitutional: She is oriented to person, place, and time. She appears well-developed and well-nourished. No distress.  HENT:  Head: Normocephalic and atraumatic.  Right Ear: Tympanic membrane normal.  Left Ear: Tympanic membrane normal.  Nose: Mucosal edema and rhinorrhea present. Right sinus exhibits maxillary sinus tenderness and frontal sinus tenderness. Left sinus exhibits maxillary sinus tenderness and frontal sinus tenderness.  Mouth/Throat: Uvula is midline and mucous membranes are normal. Posterior oropharyngeal erythema present. No oropharyngeal exudate.  Eyes: Conjunctivae and EOM are normal. Pupils are equal, round, and reactive to light.  Neck: Normal range of motion. Neck supple.  Cardiovascular: Normal rate, regular rhythm and normal heart sounds.   Pulmonary/Chest: Effort normal and breath sounds normal. No respiratory distress. She has no wheezes.  + barking cough Poor movement throughout- improved s/p neb tx  Lymphadenopathy:    She has no cervical adenopathy.  Neurological: She is alert and oriented to person, place, and time.  Psychiatric: She has a normal mood and affect. Her behavior is normal. Thought content normal.  Vitals reviewed.         Assessment & Plan:

## 2016-03-17 NOTE — Assessment & Plan Note (Signed)
Pt's sxs and PE consistent w/ sinus infxn w/ likely bronchitis.  Pt's air movement improved s/p neb tx in office and cough lessened.  Start abx.  Cough meds prn.  Reviewed supportive care and red flags that should prompt return.  Pt expressed understanding and is in agreement w/ plan.

## 2016-03-25 DIAGNOSIS — Z96652 Presence of left artificial knee joint: Secondary | ICD-10-CM | POA: Diagnosis not present

## 2016-03-25 DIAGNOSIS — Z9889 Other specified postprocedural states: Secondary | ICD-10-CM | POA: Diagnosis not present

## 2016-03-25 DIAGNOSIS — M25562 Pain in left knee: Secondary | ICD-10-CM | POA: Diagnosis not present

## 2016-03-25 DIAGNOSIS — M1712 Unilateral primary osteoarthritis, left knee: Secondary | ICD-10-CM | POA: Diagnosis not present

## 2016-03-25 DIAGNOSIS — M25462 Effusion, left knee: Secondary | ICD-10-CM | POA: Diagnosis not present

## 2016-03-28 ENCOUNTER — Encounter: Payer: Self-pay | Admitting: Family Medicine

## 2016-03-28 ENCOUNTER — Ambulatory Visit (INDEPENDENT_AMBULATORY_CARE_PROVIDER_SITE_OTHER): Payer: Medicare Other | Admitting: Family Medicine

## 2016-03-28 VITALS — BP 125/70 | HR 64 | Temp 98.1°F | Resp 17 | Ht 64.0 in | Wt 154.2 lb

## 2016-03-28 DIAGNOSIS — Z01818 Encounter for other preprocedural examination: Secondary | ICD-10-CM | POA: Diagnosis not present

## 2016-03-28 NOTE — Progress Notes (Signed)
Subjective:    Patient ID: Melissa Jordan, female    DOB: [birth year], 70 y.o.   MRN: [id number]  HPI Review of Systems    Objective:   Physical Exam    Assessment & Plan:    Subjective:    Melissa SenegalDonna H Iracheta is a 70 y.o. female who presents to the office today for a preoperative consultation at the request of surgeon Dr Sherlean FootLucey who plans on performing L TKR on TBD  . This consultation is requested for the specific conditions prompting preoperative evaluation (i.e. because of potential affect on operative risk): HTN, Hypothyroid. Planned anesthesia: general. The patient has the following known anesthesia issues: past general anesthesia with complications (nausea and vomiting). Patients bleeding risk: no recent abnormal bleeding and no remote history of abnormal bleeding. Patient does not have objections to receiving blood products if needed.  The following portions of the patient's history were reviewed and updated as appropriate: allergies, current medications, past family history, past medical history, past social history, past surgical history and problem list.  Review of Systems A comprehensive review of systems was negative. - pt denies HA, visual changes, CP, SOB, abd pain, N/V, myalgias, edema, dizziness   Objective:    BP 125/70   Pulse 64   Temp 98.1 F (36.7 C) (Oral)   Resp 17   Ht 5\' 4"  (1.626 m)   Wt 154 lb 4 oz (70 kg)   SpO2 98%   BMI 26.48 kg/m   General Appearance:    Alert, cooperative, no distress, appears stated age  Head:    Normocephalic, without obvious abnormality, atraumatic  Eyes:    PERRL, conjunctiva/corneas clear, EOM's intact, fundi    benign, both eyes  Ears:    Normal TM's and external ear canals, both ears  Nose:   Nares normal, septum midline, mucosa normal, no drainage    or sinus tenderness  Throat:   Lips, mucosa, and tongue normal; teeth and gums normal  Neck:   Supple, symmetrical, trachea midline, no adenopathy;    thyroid:  no  enlargement/tenderness/nodules  Back:     Symmetric, no curvature, ROM normal, no CVA tenderness  Lungs:     Clear to auscultation bilaterally, respirations unlabored  Chest Wall:    No tenderness or deformity   Heart:    Regular rate and rhythm, S1 and S2 normal, no murmur, rub   or gallop  Breast Exam:    No tenderness, masses, or nipple abnormality  Abdomen:     Soft, non-tender, bowel sounds active all four quadrants,    no masses, no organomegaly  Genitalia:    deferred  Rectal:    Extremities:   Extremities normal, atraumatic, no cyanosis or edema  Pulses:   2+ and symmetric all extremities  Skin:   Skin color, texture, turgor normal, no rashes or lesions  Lymph nodes:   Cervical, supraclavicular, and axillary nodes normal  Neurologic:   CNII-XII intact, normal strength, sensation and reflexes    throughout     Predictors of intubation difficulty:  Morbid obesity? no  Anatomically abnormal facies? no  Prominent incisors? no  Receding mandible? no  Short, thick neck? no  Neck range of motion: normal  Dentition: No chipped, loose, or missing teeth.  Cardiographics ECG: normal sinus rhythm, no blocks or conduction defects, no ischemic changes Echocardiogram: not done  Imaging Chest x-ray: NA   Lab Review  No visits with results within 6 Month(s) from this visit.  Latest known  visit with results is:  Scanned Document on 07/30/2015  Component Date Value  . IFOBT 07/03/2015 Negative       Assessment:      70 y.o. female with planned surgery as above.   Known risk factors for perioperative complications: Anemia   Difficulty with intubation is not anticipated.  Cardiac Risk Estimation: low  Current medications which may produce withdrawal symptoms if withheld perioperatively: none     Plan:    1. Preoperative workup as follows none. 2. Change in medication regimen before surgery: discontinue ASA 14 days before surgery. 3. Prophylaxis for cardiac events with  perioperative beta-blockers: not indicated. 4. Invasive hemodynamic monitoring perioperatively: at the discretion of anesthesiologist. 5. Deep vein thrombosis prophylaxis postoperatively:regimen to be chosen by surgical team. 6. Surveillance for postoperative MI with ECG immediately postoperatively and on postoperative days 1 and 2 AND troponin levels 24 hours postoperatively and on day 4 or hospital discharge (whichever comes first): at the discretion of anesthesiologist. 7. Other measures: NONE

## 2016-03-28 NOTE — Progress Notes (Signed)
Pre visit review using our clinic review tool, if applicable. No additional management support is needed unless otherwise documented below in the visit note. 

## 2016-03-28 NOTE — Patient Instructions (Addendum)
Schedule your complete physical at your convenience Keep up the good work!  You look great! We'll fax this to Dr Sherlean FootLucey and he can get you scheduled Call with any questions or concerns GOOD LUCK WITH SURGERY!!!

## 2016-04-03 ENCOUNTER — Other Ambulatory Visit: Payer: Self-pay | Admitting: Orthopedic Surgery

## 2016-04-08 ENCOUNTER — Other Ambulatory Visit: Payer: Self-pay | Admitting: Family Medicine

## 2016-04-23 DIAGNOSIS — G8929 Other chronic pain: Secondary | ICD-10-CM | POA: Diagnosis not present

## 2016-04-23 DIAGNOSIS — M1710 Unilateral primary osteoarthritis, unspecified knee: Secondary | ICD-10-CM | POA: Diagnosis not present

## 2016-04-23 DIAGNOSIS — M47816 Spondylosis without myelopathy or radiculopathy, lumbar region: Secondary | ICD-10-CM | POA: Diagnosis not present

## 2016-04-23 DIAGNOSIS — M25562 Pain in left knee: Secondary | ICD-10-CM | POA: Diagnosis not present

## 2016-04-28 ENCOUNTER — Encounter (HOSPITAL_COMMUNITY)
Admission: RE | Admit: 2016-04-28 | Discharge: 2016-04-28 | Disposition: A | Discharge status: Home / Self Care | Payer: Medicare Other | Source: Ambulatory Visit | Attending: Orthopedic Surgery | Admitting: Orthopedic Surgery

## 2016-04-28 ENCOUNTER — Encounter (HOSPITAL_COMMUNITY): Payer: Self-pay

## 2016-04-28 DIAGNOSIS — Z01812 Encounter for preprocedural laboratory examination: Secondary | ICD-10-CM | POA: Insufficient documentation

## 2016-04-28 DIAGNOSIS — M1712 Unilateral primary osteoarthritis, left knee: Secondary | ICD-10-CM | POA: Insufficient documentation

## 2016-04-28 LAB — CBC WITH DIFFERENTIAL/PLATELET
Basophils Absolute: 0 10*3/uL (ref 0.0–0.1)
Basophils Relative: 1 %
Eosinophils Absolute: 0.3 10*3/uL (ref 0.0–0.7)
Eosinophils Relative: 4 %
HCT: 40.6 % (ref 36.0–46.0)
Hemoglobin: 13.1 g/dL (ref 12.0–15.0)
Lymphocytes Relative: 26 %
Lymphs Abs: 1.7 10*3/uL (ref 0.7–4.0)
MCH: 29 pg (ref 26.0–34.0)
MCHC: 32.3 g/dL (ref 30.0–36.0)
MCV: 89.8 fL (ref 78.0–100.0)
Monocytes Absolute: 0.6 10*3/uL (ref 0.1–1.0)
Monocytes Relative: 9 %
Neutro Abs: 4.1 10*3/uL (ref 1.7–7.7)
Neutrophils Relative %: 60 %
Platelets: 211 10*3/uL (ref 150–400)
RBC: 4.52 MIL/uL (ref 3.87–5.11)
RDW: 12.9 % (ref 11.5–15.5)
WBC: 6.7 10*3/uL (ref 4.0–10.5)

## 2016-04-28 LAB — COMPREHENSIVE METABOLIC PANEL
ALT: 11 U/L — ABNORMAL LOW (ref 14–54)
AST: 15 U/L (ref 15–41)
Albumin: 3.9 g/dL (ref 3.5–5.0)
Alkaline Phosphatase: 71 U/L (ref 38–126)
Anion gap: 7 (ref 5–15)
BUN: 15 mg/dL (ref 6–20)
CO2: 26 mmol/L (ref 22–32)
Calcium: 8.8 mg/dL — ABNORMAL LOW (ref 8.9–10.3)
Chloride: 106 mmol/L (ref 101–111)
Creatinine, Ser: 0.68 mg/dL (ref 0.44–1.00)
GFR calc Af Amer: >60 mL/min (ref >60)
GFR calc non Af Amer: >60 mL/min (ref >60)
Glucose, Bld: 99 mg/dL (ref 65–99)
Potassium: 4 mmol/L (ref 3.5–5.1)
Sodium: 139 mmol/L (ref 135–145)
Total Bilirubin: 0.7 mg/dL (ref 0.3–1.2)
Total Protein: 5.9 g/dL — ABNORMAL LOW (ref 6.5–8.1)

## 2016-04-28 LAB — SURGICAL PCR SCREEN
MRSA, PCR: NEGATIVE
Staphylococcus aureus: NEGATIVE

## 2016-04-28 MED ORDER — CHLORHEXIDINE GLUCONATE 4 % EX LIQD: 60.0000 mL | Freq: Once | CUTANEOUS | Status: DC

## 2016-04-28 NOTE — Pre-Procedure Instructions (Addendum)
Melissa Jordan  04/28/2016      [facility] - [place] ([place]), [place] - [address] [place] [id number] Phone: [phone] Fax: [phone]    Your procedure is scheduled on Monday, November 20.  [facility] Admitting at 8:30 AM                Your surgery or procedure is scheduled for 10:30 - 11:59 AM   Call this number if you have problems the MORNING of surgery:[phone]               For any other questions, please call [phone], Monday - Friday 8 AM - 4 PM.   Remember:  Do not eat food or drink liquids after midnight Sunday, November 19.  Take these medicines the morning of surgery with A SIP OF WATER: estradiol (ESTRACE), metoprolol (LOPRESSOR).                  Take if needed metoprolol (LOPRESSOR) - second dose for heart palpations, traMADol (ULTRAM) ,acetaminophen (TYLENOL).               1 Week prior to surgery STOP taking Aspirin , Aspirin Products (Goody Powder, Excedrin Migraine), Ibuprofen (Advil), Naproxen (Aleve), Vitamins and Herbal Products (ie Fish Oil)                    [place]- Preparing For Surgery  Before surgery, you can play an important role. Because skin is not sterile, your skin needs to be as free of germs as possible. You can reduce the number of germs on your skin by washing with CHG (chlorahexidine gluconate) Soap before surgery.  CHG is an antiseptic cleaner which kills germs and bonds with the skin to continue killing germs even after washing.  Please do not use if you have an allergy to CHG or antibacterial soaps. If your skin becomes reddened/irritated stop using the CHG.  Do not shave (including legs and underarms) for at least 48 hours prior to first CHG shower. It is OK to shave your face.  Please follow these instructions carefully.   1. Shower the NIGHT BEFORE SURGERY and the MORNING OF SURGERY with CHG.   2. If you chose to wash your hair, wash your hair  first as usual with your normal shampoo.  3. After you shampoo, rinse your hair and body thoroughly to remove the shampoo.  4. Use CHG as you would any other liquid soap. You can apply CHG directly to the skin and wash gently with a scrungie or a clean washcloth.   5. Apply the CHG Soap to your body ONLY FROM THE NECK DOWN.  Do not use on open wounds or open sores. Avoid contact with your eyes, ears, mouth and genitals (private parts). Wash genitals (private parts) with your normal soap.  6. Wash thoroughly, paying special attention to the area where your surgery will be performed.  7. Thoroughly rinse your body with warm water from the neck down.  8. DO NOT shower/wash with your normal soap after using and rinsing off the CHG Soap.  9. Pat yourself dry with a CLEAN TOWEL.   10. Wear CLEAN PAJAMAS   11. Place CLEAN SHEETS on your bed the night of your first shower and DO NOT SLEEP WITH PETS.  Day of Surgery: Do not apply any deodorants/lotions. Please wear clean clothes to the hospital/surgery center.  Do not wear jewelry, make-up or nail polish.  Do not wear lotions, powders, or perfumes, or deodorant.  Do not shave 48 hours prior to surgery.               Do not bring valuables to the hospital.  [facility] is not responsible for any belongings or valuables.  Contacts, dentures or bridgework may not be worn into surgery.  Leave your suitcase in the car.  After surgery it may be brought to your room.  For patients admitted to the hospital, discharge time will be determined by your treatment team.  Patients discharged the day of surgery will not be allowed to drive home.   Special instructions:  -  Please read over the following fact sheets that you were given.  Patient Instructions for Mupirocin Application

## 2016-04-28 NOTE — Progress Notes (Signed)
PCP is Dr. Beverely Lowabori  LOV 03/2016 Thirty yrs ago, she had been worked up for Idiopathic tachycardia, nothing definitive was found, but she has been on metoprolol by PCP.  No problems since and has not been back to see cardiologist.

## 2016-04-29 ENCOUNTER — Other Ambulatory Visit (HOSPITAL_COMMUNITY): Payer: Medicare Other

## 2016-05-09 ENCOUNTER — Ambulatory Visit (INDEPENDENT_AMBULATORY_CARE_PROVIDER_SITE_OTHER): Payer: Medicare Other | Admitting: Family Medicine

## 2016-05-09 ENCOUNTER — Encounter: Payer: Self-pay | Admitting: Family Medicine

## 2016-05-09 ENCOUNTER — Telehealth: Payer: Self-pay | Admitting: Family Medicine

## 2016-05-09 VITALS — BP 131/81 | HR 62 | Temp 98.5°F | Resp 17 | Ht 64.0 in | Wt 154.5 lb

## 2016-05-09 DIAGNOSIS — J0100 Acute maxillary sinusitis, unspecified: Secondary | ICD-10-CM | POA: Diagnosis not present

## 2016-05-09 MED ORDER — TRANEXAMIC ACID 1000 MG/10ML IV SOLN
1000.0000 mg | INTRAVENOUS | Status: AC
  Administered 2016-05-12: 1000 mg via INTRAVENOUS
  Filled 2016-05-09: qty 10

## 2016-05-09 MED ORDER — DOXYCYCLINE HYCLATE 100 MG PO TABS: 100.0000 mg | ORAL_TABLET | Freq: Two times a day (BID) | ORAL | 0 refills | Status: DC

## 2016-05-09 MED ORDER — BUPIVACAINE LIPOSOME 1.3 % IJ SUSP
20.0000 mL | INTRAMUSCULAR | Status: AC
  Administered 2016-05-12: 20 mL
  Filled 2016-05-09: qty 20

## 2016-05-09 NOTE — Patient Instructions (Signed)
Follow up as needed Start the Doxycycline twice daily- take w/ food Drink plenty of fluids REST!!! Mucinex DM to thin congestion and help w/ cough Call with any questions or concerns Hang in there!! GOOD LUCK WITH SURGERY!!! Happy Holidays!!!

## 2016-05-09 NOTE — Assessment & Plan Note (Signed)
Pt's sxs and PE consistent w/ infxn.  Start abx.  Pt to call ortho and discuss knee surgery scheduled for Monday.  Reviewed supportive care and red flags that should prompt return.  Pt expressed understanding and is in agreement w/ plan.

## 2016-05-09 NOTE — Progress Notes (Signed)
   Subjective:    Patient ID: Melissa SenegalDonna H Jordan, female    DOB: Oct 03, 1945, 70 y.o.   MRN: [id number]  HPI URI- sxs started yesterday w/ hoarseness, maxillary sinus pressure, bilateral ear fullness.  No fevers.  + PND.  Minimal cough.  'feels like my head's going to explode'.  + tooth pain.  + sick contacts.   Review of Systems For ROS see HPI     Objective:   Physical Exam  Constitutional: She appears well-developed and well-nourished. No distress.  HENT:  Head: Normocephalic and atraumatic.  Right Ear: Tympanic membrane normal.  Left Ear: Tympanic membrane normal.  Nose: Mucosal edema and rhinorrhea present. Right sinus exhibits maxillary sinus tenderness and frontal sinus tenderness. Left sinus exhibits maxillary sinus tenderness and frontal sinus tenderness.  Mouth/Throat: Uvula is midline and mucous membranes are normal. Posterior oropharyngeal erythema present. No oropharyngeal exudate.  Eyes: Conjunctivae and EOM are normal. Pupils are equal, round, and reactive to light.  Neck: Normal range of motion. Neck supple.  Cardiovascular: Normal rate, regular rhythm and normal heart sounds.   Pulmonary/Chest: Effort normal and breath sounds normal. No respiratory distress. She has no wheezes.  Lymphadenopathy:    She has no cervical adenopathy.  Vitals reviewed.         Assessment & Plan:

## 2016-05-09 NOTE — Telephone Encounter (Signed)
Patient Name: Melissa Jordan DOB: 1945/08/08 Initial Comment RECORD 1 of 2: Caller states she has a sinus infection Nurse Assessment Nurse: Yetta BarreJones, RN, Miranda Date/Time (Eastern Time): 05/09/2016 7:55:03 AM Confirm and document reason for call. If symptomatic, describe symptoms. You must click the next button to save text entered. ---Caller states she has sinus congestion and hoarseness since last night. Denies fever. Has the patient traveled out of the country within the last 30 days? ---No Does the patient have any new or worsening symptoms? ---Yes Will a triage be completed? ---Yes Related visit to physician within the last 2 weeks? ---No Does the PT have any chronic conditions? (i.e. diabetes, asthma, etc.) ---Yes List chronic conditions. ---Heart rate, Scheduled for knee surgery on Monday. Is this a behavioral health or substance abuse call? ---No Guidelines Guideline Title Affirmed Question Affirmed Notes Common Cold Earache Final Disposition User See Physician within 24 Hours Yetta BarreJones, RN, Miranda Comments Appt scheduled for today at 945 with Dr. Beverely Lowabori. Referrals REFERRED TO PCP OFFICE Disagree/Comply: Comply

## 2016-05-09 NOTE — Progress Notes (Signed)
Pre visit review using our clinic review tool, if applicable. No additional management support is needed unless otherwise documented below in the visit note. 

## 2016-05-11 MED ORDER — SODIUM CHLORIDE 0.9 % IV SOLN: INTRAVENOUS | Status: DC

## 2016-05-11 MED ORDER — DEXAMETHASONE SODIUM PHOSPHATE 10 MG/ML IJ SOLN
8.0000 mg | Freq: Once | INTRAMUSCULAR | Status: AC
  Administered 2016-05-12: 8 mg via INTRAVENOUS
  Filled 2016-05-11: qty 1

## 2016-05-11 MED ORDER — CHLORHEXIDINE GLUCONATE 4 % EX LIQD: 60.0000 mL | Freq: Once | CUTANEOUS | Status: DC

## 2016-05-11 MED ORDER — CLINDAMYCIN PHOSPHATE 900 MG/50ML IV SOLN
900.0000 mg | INTRAVENOUS | Status: AC
  Administered 2016-05-12: 900 mg via INTRAVENOUS
  Filled 2016-05-11: qty 50

## 2016-05-12 ENCOUNTER — Encounter (HOSPITAL_COMMUNITY): Payer: Self-pay

## 2016-05-12 ENCOUNTER — Inpatient Hospital Stay (HOSPITAL_COMMUNITY)
Admission: RE | Admit: 2016-05-12 | Discharge: 2016-05-14 | DRG: 468 | Disposition: A | Discharge status: Home / Self Care | Payer: Medicare Other | Source: Ambulatory Visit | Attending: Orthopedic Surgery | Admitting: Orthopedic Surgery

## 2016-05-12 ENCOUNTER — Inpatient Hospital Stay (HOSPITAL_COMMUNITY): Payer: Medicare Other | Admitting: Certified Registered Nurse Anesthetist

## 2016-05-12 ENCOUNTER — Encounter (HOSPITAL_COMMUNITY)
Admission: RE | Disposition: A | Discharge status: Home / Self Care | Payer: Self-pay | Source: Ambulatory Visit | Attending: Orthopedic Surgery

## 2016-05-12 DIAGNOSIS — J45909 Unspecified asthma, uncomplicated: Secondary | ICD-10-CM | POA: Diagnosis not present

## 2016-05-12 DIAGNOSIS — G2581 Restless legs syndrome: Secondary | ICD-10-CM | POA: Diagnosis present

## 2016-05-12 DIAGNOSIS — M5430 Sciatica, unspecified side: Secondary | ICD-10-CM | POA: Diagnosis not present

## 2016-05-12 DIAGNOSIS — Z23 Encounter for immunization: Secondary | ICD-10-CM

## 2016-05-12 DIAGNOSIS — Z8701 Personal history of pneumonia (recurrent): Secondary | ICD-10-CM | POA: Diagnosis not present

## 2016-05-12 DIAGNOSIS — Z880 Allergy status to penicillin: Secondary | ICD-10-CM | POA: Diagnosis not present

## 2016-05-12 DIAGNOSIS — M1712 Unilateral primary osteoarthritis, left knee: Principal | ICD-10-CM | POA: Diagnosis present

## 2016-05-12 DIAGNOSIS — R3915 Urgency of urination: Secondary | ICD-10-CM | POA: Diagnosis not present

## 2016-05-12 DIAGNOSIS — Z888 Allergy status to other drugs, medicaments and biological substances status: Secondary | ICD-10-CM | POA: Diagnosis not present

## 2016-05-12 DIAGNOSIS — E039 Hypothyroidism, unspecified: Secondary | ICD-10-CM | POA: Diagnosis present

## 2016-05-12 DIAGNOSIS — M8580 Other specified disorders of bone density and structure, unspecified site: Secondary | ICD-10-CM | POA: Diagnosis present

## 2016-05-12 DIAGNOSIS — K219 Gastro-esophageal reflux disease without esophagitis: Secondary | ICD-10-CM | POA: Diagnosis present

## 2016-05-12 DIAGNOSIS — Z885 Allergy status to narcotic agent status: Secondary | ICD-10-CM

## 2016-05-12 DIAGNOSIS — I10 Essential (primary) hypertension: Secondary | ICD-10-CM | POA: Diagnosis present

## 2016-05-12 DIAGNOSIS — Z96659 Presence of unspecified artificial knee joint: Secondary | ICD-10-CM

## 2016-05-12 DIAGNOSIS — M549 Dorsalgia, unspecified: Secondary | ICD-10-CM | POA: Diagnosis present

## 2016-05-12 DIAGNOSIS — G8918 Other acute postprocedural pain: Secondary | ICD-10-CM | POA: Diagnosis not present

## 2016-05-12 DIAGNOSIS — Z96652 Presence of left artificial knee joint: Secondary | ICD-10-CM | POA: Diagnosis not present

## 2016-05-12 DIAGNOSIS — E785 Hyperlipidemia, unspecified: Secondary | ICD-10-CM | POA: Diagnosis not present

## 2016-05-12 DIAGNOSIS — M546 Pain in thoracic spine: Secondary | ICD-10-CM | POA: Diagnosis not present

## 2016-05-12 HISTORY — PX: TOTAL KNEE ARTHROPLASTY: SHX125

## 2016-05-12 SURGERY — ARTHROPLASTY, KNEE, TOTAL
Anesthesia: General | Site: Knee | Laterality: Left

## 2016-05-12 MED ORDER — MIDAZOLAM HCL 2 MG/2ML IJ SOLN
INTRAMUSCULAR | Status: AC
  Filled 2016-05-12: qty 2

## 2016-05-12 MED ORDER — FLEET ENEMA 7-19 GM/118ML RE ENEM: 1.0000 | ENEMA | Freq: Once | RECTAL | Status: DC | PRN

## 2016-05-12 MED ORDER — ACETAMINOPHEN 325 MG PO TABS
650.0000 mg | ORAL_TABLET | Freq: Four times a day (QID) | ORAL | Status: DC | PRN
  Administered 2016-05-13: 650 mg via ORAL
  Filled 2016-05-12: qty 2

## 2016-05-12 MED ORDER — FENTANYL CITRATE (PF) 100 MCG/2ML IJ SOLN
INTRAMUSCULAR | Status: AC
  Filled 2016-05-12: qty 2

## 2016-05-12 MED ORDER — DIPHENHYDRAMINE HCL 12.5 MG/5ML PO ELIX: 12.5000 mg | ORAL_SOLUTION | ORAL | Status: DC | PRN

## 2016-05-12 MED ORDER — ZOLPIDEM TARTRATE 5 MG PO TABS: 5.0000 mg | ORAL_TABLET | Freq: Every evening | ORAL | Status: DC | PRN

## 2016-05-12 MED ORDER — PROMETHAZINE HCL 25 MG/ML IJ SOLN
6.2500 mg | Freq: Once | INTRAMUSCULAR | Status: AC
  Administered 2016-05-12: 6.25 mg via INTRAVENOUS

## 2016-05-12 MED ORDER — METOPROLOL TARTRATE 50 MG PO TABS
50.0000 mg | ORAL_TABLET | Freq: Every day | ORAL | Status: DC
  Administered 2016-05-13 – 2016-05-14 (×2): 50 mg via ORAL
  Filled 2016-05-12 (×2): qty 1

## 2016-05-12 MED ORDER — FENTANYL CITRATE (PF) 100 MCG/2ML IJ SOLN
INTRAMUSCULAR | Status: DC | PRN
  Administered 2016-05-12 (×2): 100 ug via INTRAVENOUS
  Administered 2016-05-12 (×4): 50 ug via INTRAVENOUS

## 2016-05-12 MED ORDER — MENTHOL 3 MG MT LOZG: 1.0000 | LOZENGE | OROMUCOSAL | Status: DC | PRN

## 2016-05-12 MED ORDER — ROPINIROLE HCL 0.5 MG PO TABS
0.5000 mg | ORAL_TABLET | Freq: Every day | ORAL | Status: DC
  Administered 2016-05-12 – 2016-05-13 (×2): 0.5 mg via ORAL
  Filled 2016-05-12 (×2): qty 1

## 2016-05-12 MED ORDER — METOPROLOL TARTRATE 5 MG/5ML IV SOLN
INTRAVENOUS | Status: DC | PRN
  Administered 2016-05-12: 2 mg via INTRAVENOUS
  Administered 2016-05-12: 1 mg via INTRAVENOUS
  Administered 2016-05-12: 2 mg via INTRAVENOUS

## 2016-05-12 MED ORDER — ROCURONIUM BROMIDE 10 MG/ML (PF) SYRINGE
PREFILLED_SYRINGE | INTRAVENOUS | Status: AC
  Filled 2016-05-12: qty 10

## 2016-05-12 MED ORDER — ESTRADIOL 1 MG PO TABS
1.0000 mg | ORAL_TABLET | ORAL | Status: DC
  Administered 2016-05-12 – 2016-05-13 (×2): 1 mg via ORAL
  Filled 2016-05-12 (×2): qty 1

## 2016-05-12 MED ORDER — HYDROMORPHONE HCL 1 MG/ML IJ SOLN
INTRAMUSCULAR | Status: AC
  Filled 2016-05-12: qty 0.5

## 2016-05-12 MED ORDER — ACETAMINOPHEN 10 MG/ML IV SOLN
INTRAVENOUS | Status: AC
  Filled 2016-05-12: qty 100

## 2016-05-12 MED ORDER — HYDROMORPHONE HCL 1 MG/ML IJ SOLN
0.2500 mg | INTRAMUSCULAR | Status: DC | PRN
  Administered 2016-05-12 (×4): 0.5 mg via INTRAVENOUS

## 2016-05-12 MED ORDER — DOCUSATE SODIUM 100 MG PO CAPS
100.0000 mg | ORAL_CAPSULE | Freq: Two times a day (BID) | ORAL | Status: DC
  Administered 2016-05-12 – 2016-05-14 (×4): 100 mg via ORAL
  Filled 2016-05-12 (×4): qty 1

## 2016-05-12 MED ORDER — TRANEXAMIC ACID 1000 MG/10ML IV SOLN
1000.0000 mg | Freq: Once | INTRAVENOUS | Status: AC
  Administered 2016-05-12: 1000 mg via INTRAVENOUS
  Filled 2016-05-12: qty 10

## 2016-05-12 MED ORDER — MIDAZOLAM HCL 2 MG/2ML IJ SOLN
1.5000 mg | Freq: Once | INTRAMUSCULAR | Status: AC
  Administered 2016-05-12: 1.5 mg via INTRAVENOUS

## 2016-05-12 MED ORDER — ASPIRIN EC 325 MG PO TBEC
325.0000 mg | DELAYED_RELEASE_TABLET | Freq: Two times a day (BID) | ORAL | Status: DC
  Administered 2016-05-12 – 2016-05-14 (×4): 325 mg via ORAL
  Filled 2016-05-12 (×4): qty 1

## 2016-05-12 MED ORDER — BUPIVACAINE HCL (PF) 0.5 % IJ SOLN
INTRAMUSCULAR | Status: AC
  Filled 2016-05-12: qty 30

## 2016-05-12 MED ORDER — ACETAMINOPHEN 650 MG RE SUPP: 650.0000 mg | Freq: Four times a day (QID) | RECTAL | Status: DC | PRN

## 2016-05-12 MED ORDER — SCOPOLAMINE 1 MG/3DAYS TD PT72
MEDICATED_PATCH | TRANSDERMAL | Status: DC | PRN
  Administered 2016-05-12: 1 via TRANSDERMAL

## 2016-05-12 MED ORDER — EPINEPHRINE PF 1 MG/ML IJ SOLN
INTRAMUSCULAR | Status: AC
  Filled 2016-05-12: qty 1

## 2016-05-12 MED ORDER — ONDANSETRON HCL 4 MG/2ML IJ SOLN
4.0000 mg | Freq: Four times a day (QID) | INTRAMUSCULAR | Status: DC | PRN
  Administered 2016-05-12 – 2016-05-14 (×4): 4 mg via INTRAVENOUS
  Filled 2016-05-12 (×4): qty 2

## 2016-05-12 MED ORDER — METHOCARBAMOL 500 MG PO TABS
500.0000 mg | ORAL_TABLET | Freq: Four times a day (QID) | ORAL | Status: DC | PRN
  Administered 2016-05-12 – 2016-05-14 (×6): 500 mg via ORAL
  Filled 2016-05-12 (×7): qty 1

## 2016-05-12 MED ORDER — PHENOL 1.4 % MT LIQD: 1.0000 | OROMUCOSAL | Status: DC | PRN

## 2016-05-12 MED ORDER — LABETALOL HCL 5 MG/ML IV SOLN
INTRAVENOUS | Status: AC
  Filled 2016-05-12: qty 4

## 2016-05-12 MED ORDER — OXYCODONE HCL ER 10 MG PO T12A
10.0000 mg | EXTENDED_RELEASE_TABLET | Freq: Two times a day (BID) | ORAL | Status: DC
  Administered 2016-05-12 – 2016-05-14 (×4): 10 mg via ORAL
  Filled 2016-05-12 (×4): qty 1

## 2016-05-12 MED ORDER — FENTANYL CITRATE (PF) 100 MCG/2ML IJ SOLN
INTRAMUSCULAR | Status: AC
  Filled 2016-05-12: qty 4

## 2016-05-12 MED ORDER — BUPIVACAINE-EPINEPHRINE 0.5% -1:200000 IJ SOLN
INTRAMUSCULAR | Status: DC | PRN
  Administered 2016-05-12: 30 mL

## 2016-05-12 MED ORDER — ALUM & MAG HYDROXIDE-SIMETH 200-200-20 MG/5ML PO SUSP: 30.0000 mL | ORAL | Status: DC | PRN

## 2016-05-12 MED ORDER — HYDROMORPHONE HCL 1 MG/ML IJ SOLN
0.5000 mg | Freq: Once | INTRAMUSCULAR | Status: AC
  Administered 2016-05-12: 0.5 mg via INTRAVENOUS

## 2016-05-12 MED ORDER — METOCLOPRAMIDE HCL 5 MG PO TABS: 5.0000 mg | ORAL_TABLET | Freq: Three times a day (TID) | ORAL | Status: DC | PRN

## 2016-05-12 MED ORDER — SENNOSIDES-DOCUSATE SODIUM 8.6-50 MG PO TABS: 1.0000 | ORAL_TABLET | Freq: Every evening | ORAL | Status: DC | PRN

## 2016-05-12 MED ORDER — METOPROLOL TARTRATE 5 MG/5ML IV SOLN
INTRAVENOUS | Status: AC
  Filled 2016-05-12: qty 5

## 2016-05-12 MED ORDER — 0.9 % SODIUM CHLORIDE (POUR BTL) OPTIME
TOPICAL | Status: DC | PRN
  Administered 2016-05-12: 1000 mL

## 2016-05-12 MED ORDER — OXYCODONE HCL 5 MG PO TABS
5.0000 mg | ORAL_TABLET | ORAL | Status: DC | PRN
  Administered 2016-05-12 – 2016-05-14 (×11): 10 mg via ORAL
  Filled 2016-05-12 (×11): qty 2

## 2016-05-12 MED ORDER — ONDANSETRON HCL 4 MG/2ML IJ SOLN
INTRAMUSCULAR | Status: DC | PRN
  Administered 2016-05-12: 4 mg via INTRAVENOUS

## 2016-05-12 MED ORDER — SUCCINYLCHOLINE CHLORIDE 200 MG/10ML IV SOSY
PREFILLED_SYRINGE | INTRAVENOUS | Status: AC
  Filled 2016-05-12: qty 10

## 2016-05-12 MED ORDER — SODIUM CHLORIDE 0.9 % IR SOLN
Status: DC | PRN
  Administered 2016-05-12: 3000 mL

## 2016-05-12 MED ORDER — CLINDAMYCIN PHOSPHATE 600 MG/50ML IV SOLN
600.0000 mg | Freq: Four times a day (QID) | INTRAVENOUS | Status: AC
  Administered 2016-05-12 – 2016-05-13 (×2): 600 mg via INTRAVENOUS
  Filled 2016-05-12 (×2): qty 50

## 2016-05-12 MED ORDER — LIDOCAINE HCL (CARDIAC) 20 MG/ML IV SOLN
INTRAVENOUS | Status: DC | PRN
  Administered 2016-05-12: 100 mg via INTRAVENOUS

## 2016-05-12 MED ORDER — BISACODYL 5 MG PO TBEC: 5.0000 mg | DELAYED_RELEASE_TABLET | Freq: Every day | ORAL | Status: DC | PRN

## 2016-05-12 MED ORDER — METHOCARBAMOL 1000 MG/10ML IJ SOLN: 500.0000 mg | Freq: Four times a day (QID) | INTRAVENOUS | Status: DC | PRN

## 2016-05-12 MED ORDER — SODIUM CHLORIDE 0.9 % IV SOLN
INTRAVENOUS | Status: DC
  Administered 2016-05-12 – 2016-05-13 (×2): via INTRAVENOUS

## 2016-05-12 MED ORDER — ONDANSETRON HCL 4 MG/2ML IJ SOLN
INTRAMUSCULAR | Status: AC
  Filled 2016-05-12: qty 2

## 2016-05-12 MED ORDER — HYDROMORPHONE HCL 2 MG/ML IJ SOLN
1.0000 mg | INTRAMUSCULAR | Status: DC | PRN
  Administered 2016-05-12 – 2016-05-14 (×10): 1 mg via INTRAVENOUS
  Filled 2016-05-12 (×10): qty 1

## 2016-05-12 MED ORDER — DEXAMETHASONE SODIUM PHOSPHATE 10 MG/ML IJ SOLN
10.0000 mg | Freq: Once | INTRAMUSCULAR | Status: AC
  Administered 2016-05-13: 10 mg via INTRAVENOUS
  Filled 2016-05-12: qty 1

## 2016-05-12 MED ORDER — FENTANYL CITRATE (PF) 100 MCG/2ML IJ SOLN
50.0000 ug | Freq: Once | INTRAMUSCULAR | Status: AC
  Administered 2016-05-12: 50 ug via INTRAVENOUS

## 2016-05-12 MED ORDER — PROMETHAZINE HCL 25 MG/ML IJ SOLN
INTRAMUSCULAR | Status: AC
  Filled 2016-05-12: qty 1

## 2016-05-12 MED ORDER — PROPOFOL 10 MG/ML IV BOLUS
INTRAVENOUS | Status: DC | PRN
Start: 2016-05-12 — End: 2016-05-12
  Administered 2016-05-12: 150 mg via INTRAVENOUS

## 2016-05-12 MED ORDER — EPHEDRINE 5 MG/ML INJ
INTRAVENOUS | Status: AC
  Filled 2016-05-12: qty 40

## 2016-05-12 MED ORDER — ONDANSETRON HCL 4 MG PO TABS
4.0000 mg | ORAL_TABLET | Freq: Four times a day (QID) | ORAL | Status: DC | PRN
  Administered 2016-05-13: 4 mg via ORAL
  Filled 2016-05-12: qty 1

## 2016-05-12 MED ORDER — EPHEDRINE SULFATE 50 MG/ML IJ SOLN
INTRAMUSCULAR | Status: DC | PRN
  Administered 2016-05-12 (×2): 15 mg via INTRAVENOUS

## 2016-05-12 MED ORDER — LACTATED RINGERS IV SOLN
INTRAVENOUS | Status: DC
  Administered 2016-05-12 (×2): via INTRAVENOUS

## 2016-05-12 MED ORDER — LIDOCAINE 2% (20 MG/ML) 5 ML SYRINGE
INTRAMUSCULAR | Status: AC
  Filled 2016-05-12: qty 10

## 2016-05-12 MED ORDER — LABETALOL HCL 5 MG/ML IV SOLN
INTRAVENOUS | Status: DC | PRN
  Administered 2016-05-12 (×2): 10 mg via INTRAVENOUS

## 2016-05-12 MED ORDER — METOCLOPRAMIDE HCL 5 MG/ML IJ SOLN
5.0000 mg | Freq: Three times a day (TID) | INTRAMUSCULAR | Status: DC | PRN
  Administered 2016-05-13: 10 mg via INTRAVENOUS
  Filled 2016-05-12: qty 2

## 2016-05-12 MED ORDER — PHENYLEPHRINE 40 MCG/ML (10ML) SYRINGE FOR IV PUSH (FOR BLOOD PRESSURE SUPPORT)
PREFILLED_SYRINGE | INTRAVENOUS | Status: AC
Start: 2016-05-12 — End: 2016-05-12
  Filled 2016-05-12: qty 30

## 2016-05-12 MED ORDER — SODIUM CHLORIDE 0.9 % IJ SOLN
INTRAMUSCULAR | Status: DC | PRN
  Administered 2016-05-12: 20 mL

## 2016-05-12 MED ORDER — GABAPENTIN 300 MG PO CAPS
300.0000 mg | ORAL_CAPSULE | Freq: Three times a day (TID) | ORAL | Status: DC
  Administered 2016-05-12 – 2016-05-14 (×5): 300 mg via ORAL
  Filled 2016-05-12 (×5): qty 1

## 2016-05-12 MED ORDER — ACETAMINOPHEN 10 MG/ML IV SOLN
1000.0000 mg | Freq: Once | INTRAVENOUS | Status: AC
  Administered 2016-05-12: 1000 mg via INTRAVENOUS

## 2016-05-12 SURGICAL SUPPLY — 58 items
BANDAGE ESMARK 6X9 LF (GAUZE/BANDAGES/DRESSINGS) ×1 IMPLANT
BLADE SAGITTAL 13X1.27X60 (BLADE) ×2 IMPLANT
BLADE SAW SGTL 83.5X18.5 (BLADE) ×2 IMPLANT
BLADE SURG 10 STRL SS (BLADE) ×3 IMPLANT
BNDG CMPR 9X6 STRL LF SNTH (GAUZE/BANDAGES/DRESSINGS) ×1
BNDG ESMARK 6X9 LF (GAUZE/BANDAGES/DRESSINGS) ×2
BOWL SMART MIX CTS (DISPOSABLE) ×2 IMPLANT
CAPT KNEE TOTAL 3 ×2 IMPLANT
CEMENT BONE SIMPLEX SPEEDSET (Cement) ×3 IMPLANT
COVER SURGICAL LIGHT HANDLE (MISCELLANEOUS) ×2 IMPLANT
CUFF TOURNIQUET SINGLE 34IN LL (TOURNIQUET CUFF) ×2 IMPLANT
DRAPE HALF SHEET 40X57 (DRAPES) ×2 IMPLANT
DRAPE INCISE IOBAN 66X45 STRL (DRAPES) ×4 IMPLANT
DRAPE U-SHAPE 47X51 STRL (DRAPES) ×2 IMPLANT
DRSG AQUACEL AG ADV 3.5X10 (GAUZE/BANDAGES/DRESSINGS) ×2 IMPLANT
DRSG PAD ABDOMINAL 8X10 ST (GAUZE/BANDAGES/DRESSINGS) ×2 IMPLANT
DURAPREP 26ML APPLICATOR (WOUND CARE) ×4 IMPLANT
ELECT REM PT RETURN 9FT ADLT (ELECTROSURGICAL) ×2
ELECTRODE REM PT RTRN 9FT ADLT (ELECTROSURGICAL) ×1 IMPLANT
GLOVE BIOGEL M 7.0 STRL (GLOVE) IMPLANT
GLOVE BIOGEL PI IND STRL 7.5 (GLOVE) IMPLANT
GLOVE BIOGEL PI IND STRL 8.5 (GLOVE) ×1 IMPLANT
GLOVE BIOGEL PI INDICATOR 7.5 (GLOVE)
GLOVE BIOGEL PI INDICATOR 8.5 (GLOVE) ×1
GLOVE SURG ORTHO 8.0 STRL STRW (GLOVE) ×4 IMPLANT
GOWN STRL REUS W/ TWL LRG LVL3 (GOWN DISPOSABLE) ×1 IMPLANT
GOWN STRL REUS W/ TWL XL LVL3 (GOWN DISPOSABLE) ×2 IMPLANT
GOWN STRL REUS W/TWL 2XL LVL3 (GOWN DISPOSABLE) ×2 IMPLANT
GOWN STRL REUS W/TWL LRG LVL3 (GOWN DISPOSABLE) ×2
GOWN STRL REUS W/TWL XL LVL3 (GOWN DISPOSABLE) ×4
HANDPIECE INTERPULSE COAX TIP (DISPOSABLE) ×2
HOOD PEEL AWAY FACE SHEILD DIS (HOOD) ×6 IMPLANT
KIT BASIN OR (CUSTOM PROCEDURE TRAY) ×2 IMPLANT
KIT ROOM TURNOVER OR (KITS) ×2 IMPLANT
KNEE CAPITATED TOTAL 3 IMPLANT
MANIFOLD NEPTUNE II (INSTRUMENTS) ×2 IMPLANT
NARROW THIN SAGITTAL BLADE EXTRA SHORT ×1 IMPLANT
NEEDLE 22X1 1/2 (OR ONLY) (NEEDLE) ×4 IMPLANT
NS IRRIG 1000ML POUR BTL (IV SOLUTION) ×2 IMPLANT
PACK TOTAL JOINT (CUSTOM PROCEDURE TRAY) ×2 IMPLANT
PAD ARMBOARD 7.5X6 YLW CONV (MISCELLANEOUS) ×4 IMPLANT
SET HNDPC FAN SPRY TIP SCT (DISPOSABLE) ×1 IMPLANT
STAPLER VISISTAT 35W (STAPLE) ×2 IMPLANT
STRIP CLOSURE SKIN 1/2X4 (GAUZE/BANDAGES/DRESSINGS) ×2 IMPLANT
SUCTION FRAZIER HANDLE 10FR (MISCELLANEOUS)
SUCTION TUBE FRAZIER 10FR DISP (MISCELLANEOUS) IMPLANT
SUT BONE WAX W31G (SUTURE) ×2 IMPLANT
SUT MNCRL AB 4-0 PS2 18 (SUTURE) ×1 IMPLANT
SUT VIC AB 0 CTB1 27 (SUTURE) ×4 IMPLANT
SUT VIC AB 1 CT1 27 (SUTURE) ×4
SUT VIC AB 1 CT1 27XBRD ANBCTR (SUTURE) ×2 IMPLANT
SUT VIC AB 2-0 CT1 27 (SUTURE) ×4
SUT VIC AB 2-0 CT1 TAPERPNT 27 (SUTURE) ×2 IMPLANT
SYR 20CC LL (SYRINGE) ×4 IMPLANT
TOWEL OR 17X24 6PK STRL BLUE (TOWEL DISPOSABLE) ×2 IMPLANT
TOWEL OR 17X26 10 PK STRL BLUE (TOWEL DISPOSABLE) ×2 IMPLANT
TRAY CATH 16FR W/PLASTIC CATH (SET/KITS/TRAYS/PACK) IMPLANT
WRAP KNEE MAXI GEL POST OP (GAUZE/BANDAGES/DRESSINGS) ×2 IMPLANT

## 2016-05-12 NOTE — Progress Notes (Signed)
Report given to philip lopez rn as caregiver 

## 2016-05-12 NOTE — Transfer of Care (Signed)
Immediate Anesthesia Transfer of Care Note  Patient: Melissa Jordan  Procedure(s) Performed: Procedure(s): LEFT TOTAL KNEE ARTHROPLASTY (Left)  Patient Location: PACU  Anesthesia Type:GA combined with regional for post-op pain  Level of Consciousness: awake, alert , oriented and patient cooperative  Airway & Oxygen Therapy: Patient Spontanous Breathing and Patient connected to nasal cannula oxygen  Post-op Assessment: Report given to RN and Post -op Vital signs reviewed and stable  Post vital signs: Reviewed and stable  Last Vitals:  Vitals:   05/12/16 1210 05/12/16 1215  BP: (!) 182/69 (!) 162/72  Pulse: 68 (!) 59  Resp: 15 14  Temp:      Last Pain:  Vitals:   05/12/16 1200  TempSrc:   PainSc: 0-No pain         Complications: No apparent anesthesia complications

## 2016-05-12 NOTE — Progress Notes (Signed)
Orthopedic Tech Progress Note Patient Details:  Melissa SenegalDonna H Jordan 1946/06/02 [id number]  CPM Left Knee CPM Left Knee: On Left Knee Flexion (Degrees): 90 Left Knee Extension (Degrees): 0   Collan Schoenfeld 05/12/2016, 4:24 PM Trapeze bar patient helper eill be provided when one becomes available; RN notified

## 2016-05-12 NOTE — Anesthesia Procedure Notes (Addendum)
Anesthesia Regional Block:  Adductor canal block  Pre-Anesthetic Checklist: ,, timeout performed, Correct Patient, Correct Site, Correct Laterality, Correct Procedure, Correct Position, site marked, Risks and benefits discussed, pre-op evaluation,  At surgeon's request and post-op pain management  Laterality: Left  Prep: chloraprep       Needles:   Needle Type: Echogenic Needle      Needle Gauge: 21 and 21 G    Additional Needles:  Procedures: ultrasound guided (picture in chart) Adductor canal block Narrative:  Start time: 05/12/2016 11:44 AM End time: 05/12/2016 11:56 AM Injection made incrementally with aspirations every 5 mL. Anesthesiologist: Cristela BlueJACKSON, Antonetta Clanton  Additional Notes: 20cc  .75% Naropin

## 2016-05-12 NOTE — Anesthesia Postprocedure Evaluation (Signed)
Anesthesia Post Note  Patient: Melissa SenegalDonna H Jordan  Procedure(s) Performed: Procedure(s) (LRB): LEFT TOTAL KNEE ARTHROPLASTY (Left)  Patient location during evaluation: PACU Anesthesia Type: General and Regional Level of consciousness: awake and alert Pain management: pain level controlled Vital Signs Assessment: post-procedure vital signs reviewed and stable Respiratory status: spontaneous breathing, nonlabored ventilation, respiratory function stable and patient connected to nasal cannula oxygen Cardiovascular status: blood pressure returned to baseline and stable Postop Assessment: no signs of nausea or vomiting Anesthetic complications: no    Last Vitals:  Vitals:   05/12/16 1515 05/12/16 1800  BP: (!) 168/85   Pulse: 78   Resp: 18   Temp:  36.9 C    Last Pain:  Vitals:   05/12/16 1745  TempSrc:   PainSc: Asleep    LLE Motor Response: Responds to commands (05/12/16 1800) LLE Sensation: Full sensation (05/12/16 1800)          Kaley Jutras,W. EDMOND
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
## 2016-05-12 NOTE — Anesthesia Preprocedure Evaluation (Addendum)
Anesthesia Evaluation  Patient identified by MRN, date of birth, ID band Patient awake    Reviewed: Allergy & Precautions, H&P , Patient's Chart, lab work & pertinent test results, reviewed documented beta blocker date and time   Airway Mallampati: II  TM Distance: >3 FB Neck ROM: full    Dental no notable dental hx.    Pulmonary    Pulmonary exam normal breath sounds clear to auscultation       Cardiovascular hypertension,  Rhythm:regular Rate:Normal     Neuro/Psych    GI/Hepatic   Endo/Other    Renal/GU      Musculoskeletal   Abdominal   Peds  Hematology   Anesthesia Other Findings Multiple back surgeries  Reproductive/Obstetrics                            Anesthesia Physical Anesthesia Plan  ASA: II  Anesthesia Plan:    Post-op Pain Management:    Induction: Intravenous  Airway Management Planned: LMA  Additional Equipment:   Intra-op Plan:   Post-operative Plan:   Informed Consent: I have reviewed the patients History and Physical, chart, labs and discussed the procedure including the risks, benefits and alternatives for the proposed anesthesia with the patient or authorized representative who has indicated his/her understanding and acceptance.   Dental Advisory Given and Dental advisory given  Plan Discussed with: CRNA and Surgeon  Anesthesia Plan Comments: (Discussed GA with LMA, possible sore throat, potential need to switch to ETT, N/V, pulmonary aspiration. Questions answered. )        Anesthesia Quick Evaluation

## 2016-05-12 NOTE — Anesthesia Procedure Notes (Signed)
Procedure Name: LMA Insertion Date/Time: 05/12/2016 12:56 PM Performed by: Faustino CongressWHITE, Bianco Cange TENA Reiko Vinje Pre-anesthesia Checklist: Patient identified, Emergency Drugs available, Suction available and Patient being monitored Patient Re-evaluated:Patient Re-evaluated prior to inductionOxygen Delivery Method: Circle System Utilized Preoxygenation: Pre-oxygenation with 100% oxygen Intubation Type: IV induction Ventilation: Mask ventilation without difficulty LMA: LMA inserted LMA Size: 4.0 Number of attempts: 3 Placement Confirmation: positive ETCO2 Tube secured with: Tape Dental Injury: Teeth and Oropharynx as per pre-operative assessment  Comments: Attempt by CRNA x2, unable to seat LMA, Attempt x1 by MD, able to seat LMA, +ETCO2, BBS=, secured

## 2016-05-13 ENCOUNTER — Encounter (HOSPITAL_COMMUNITY): Payer: Self-pay

## 2016-05-13 LAB — CBC
HCT: 31.9 % — ABNORMAL LOW (ref 36.0–46.0)
Hemoglobin: 10.7 g/dL — ABNORMAL LOW (ref 12.0–15.0)
MCH: 29.7 pg (ref 26.0–34.0)
MCHC: 33.5 g/dL (ref 30.0–36.0)
MCV: 88.6 fL (ref 78.0–100.0)
Platelets: 259 10*3/uL (ref 150–400)
RBC: 3.6 MIL/uL — ABNORMAL LOW (ref 3.87–5.11)
RDW: 12.9 % (ref 11.5–15.5)
WBC: 13.5 10*3/uL — ABNORMAL HIGH (ref 4.0–10.5)

## 2016-05-13 LAB — BASIC METABOLIC PANEL
Anion gap: 11 (ref 5–15)
BUN: 13 mg/dL (ref 6–20)
CO2: 24 mmol/L (ref 22–32)
Calcium: 8.5 mg/dL — ABNORMAL LOW (ref 8.9–10.3)
Chloride: 103 mmol/L (ref 101–111)
Creatinine, Ser: 0.81 mg/dL (ref 0.44–1.00)
GFR calc Af Amer: >60 mL/min (ref >60)
GFR calc non Af Amer: >60 mL/min (ref >60)
Glucose, Bld: 120 mg/dL — ABNORMAL HIGH (ref 65–99)
Potassium: 3.9 mmol/L (ref 3.5–5.1)
Sodium: 138 mmol/L (ref 135–145)

## 2016-05-13 MED ORDER — METHOCARBAMOL 500 MG PO TABS: 500.0000 mg | ORAL_TABLET | Freq: Four times a day (QID) | ORAL | 0 refills | Status: DC | PRN

## 2016-05-13 MED ORDER — ONDANSETRON HCL 4 MG PO TABS: 4.0000 mg | ORAL_TABLET | Freq: Four times a day (QID) | ORAL | 0 refills | Status: DC | PRN

## 2016-05-13 MED ORDER — OXYCODONE HCL 5 MG PO TABS: 5.0000 mg | ORAL_TABLET | ORAL | 0 refills | Status: DC | PRN

## 2016-05-13 MED ORDER — ASPIRIN 325 MG PO TBEC: 325.0000 mg | DELAYED_RELEASE_TABLET | Freq: Two times a day (BID) | ORAL | 0 refills | Status: DC

## 2016-05-13 NOTE — Op Note (Addendum)
TOTAL KNEE REPLACEMENT OPERATIVE NOTE:  05/12/2016  10:29 AM  PATIENT:  Melissa Jordan  70 y.o. female  PRE-OPERATIVE DIAGNOSIS:  primary osteoarthritis left knee status post left unicompartment arthroplasty   POST-OPERATIVE DIAGNOSIS:  primary osteoarthritis left knee status post left unicompartment arthroplasty   PROCEDURE:  Procedure(s): LEFT REVISION TOTAL KNEE ARTHROPLASTY  SURGEON:  Surgeon(s): Dannielle HuhSteve Terral Cooks, MD  PHYSICIAN ASSISTANT: Skip MayerBlair Roberts, PA-C  ANESTHESIA:   general  DRAINS: Hemovac  SPECIMEN: None  COUNTS:  Correct  TOURNIQUET:   Total Tourniquet Time Documented: Thigh (Left) - 60 minutes Total: Thigh (Left) - 60 minutes   DICTATION:  Indication for procedure:    The patient is a 70 y.o. female who has failed conservative treatment for primary osteoarthritis left knee status post left unicompartment arthroplasty .  Informed consent was obtained prior to anesthesia. The risks versus benefits of the operation were explain and in a way the patient can, and did, understand.   On the implant demand matching protocol, this patient scored 10.  Therefore, this patient was not receive a polyethylene insert with vitamin E which is a high demand implant.  Description of procedure:     The patient was taken to the operating room and placed under anesthesia.  The patient was positioned in the usual fashion taking care that all body parts were adequately padded and/or protected.  I foley catheter was not placed.  A tourniquet was applied and the leg prepped and draped in the usual sterile fashion.  The extremity was exsanguinated with the esmarch and tourniquet inflated to 350 mmHg.  Pre-operative range of motion was normal.  The knee was in 1 degree of mild valgus.  A midline incision approximately 6-7 inches long was made with a #10 blade.  A new blade was used to make a parapatellar arthrotomy going 2-3 cm into the quadriceps tendon, over the patella, and  alongside the medial aspect of the patellar tendon.  A synovectomy was then performed with the #10 blade and forceps. I then elevated the deep MCL off the medial tibial metaphysis subperiosteally around to the semimembranosus attachment.    I everted the patella and used calipers to measure patellar thickness.  I used the reamer to ream down to appropriate thickness to recreate the native thickness.  I then removed excess bone with the rongeur and sagittal saw.  I used the appropriately sized template and drilled the three lug holes.  I then put the trial in place and measured the thickness with the calipers to ensure recreation of the native thickness.  The trial was then removed and the patella subluxed and the knee brought into flexion.  A homan retractor was place to retract and protect the patella and lateral structures.  A Z-retractor was place medially to protect the medial structures.  The extra-medullary alignment system was used to make cut the tibial articular surface perpendicular to the anamotic axis of the tibia and in 3 degrees of posterior slope. tHIS WAS JUST UNDER THE MEDIAL UNI TIBIAL TRAY.  THE COMPONENT WAS REMOVED EASILY WITH MINIMAL BONE LOSS. The cut surface and alignment jig was removed.  I then used the intramedullary alignment guide to make a 5 valgus cut on the distal femur.  THIS WAS JUST BENEATH THE UNI FEMORAL COMPOMENT.  THE COMPONENT WAS REMOVED WITH MINIMAL BONE LOSS.  I then marked out the epicondylar axis on the distal femur.  The posterior condylar axis measured 3 degrees.  I then used the anterior  referencing sizer and measured the femur to be a size 6.  The 4-In-1 cutting block was screwed into place in external rotation matching the posterior condylar angle, making our cuts perpendicular to the epicondylar axis.  Anterior, posterior and chamfer cuts were made with the sagittal saw.  The cutting block and cut pieces were removed.  A lamina spreader was placed in 90  degrees of flexion.  The ACL, PCL, menisci, and posterior condylar osteophytes were removed.  A 16 mm spacer blocked was found to offer good flexion and extension gap balance after minimal in degree releasing.   The scoop retractor was then placed and the femoral finishing block was pinned in place.  The small sagittal saw was used as well as the lug drill to finish the femur.  The block and cut surfaces were removed and the medullary canal hole filled with autograft bone from the cut pieces.  The tibia was delivered forward in deep flexion and external rotation.  A size D tray was selected and pinned into place centered on the medial 1/3 of the tibial tubercle.  The reamer and keel was used to prepare the tibia through the tray.    I then trialed with the size 6 femur, size D tibia, a 16 mm insert and the 32 patella.  I had excellent flexion/extension gap balance, excellent patella tracking.  Flexion was full and beyond 120 degrees; extension was zero.  These components were chosen and the staff opened them to me on the back table while the knee was lavaged copiously and the cement mixed.  The soft tissue was infiltrated with 60cc of exparel 1.3% through a 21 gauge needle.  I cemented in the components and removed all excess cement.  The polyethylene tibial component was snapped into place and the knee placed in extension while cement was hardening.  The capsule was infilltrated with 30cc of .25% Marcaine with epinephrine.  A hemovac was place in the joint exiting superolaterally.  A pain pump was place superomedially superficial to the arthrotomy.  Once the cement was hard, the tourniquet was let down.  Hemostasis was obtained.  The arthrotomy was closed with figure-8 #1 vicryl sutures.  The deep soft tissues were closed with #0 vicryls and the subcuticular layer closed with a running #2-0 vicryl.  The skin was reapproximated and closed with skin staples.  The wound was dressed with xeroform, 4 x4's, 2 ABD  sponges, a single layer of webril and a TED stocking.   The patient was then awakened, extubated, and taken to the recovery room in stable condition.  BLOOD LOSS:  300cc DRAINS: 1 hemovac, 1 pain catheter COMPLICATIONS:  None.  PLAN OF CARE: Admit to inpatient   PATIENT DISPOSITION:  PACU - hemodynamically stable.   Delay start of Pharmacological VTE agent (>24hrs) due to surgical blood loss or risk of bleeding:  not applicable  Please fax a copy of this op note to my office at [phone] (please only include page 1 and 2 of the Case Information op note)

## 2016-05-13 NOTE — Evaluation (Signed)
Occupational Therapy Evaluation Patient Details Name: Melissa Jordan MRN: [id number] DOB: 1946/01/10 Today's Date: 05/13/2016    History of Present Illness PT s/p elective L TKA on 11/20. Pt with previous back surgeries and bilat uniknees.   Clinical Impression   PTA, pt was independent with ADL and IADL. 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 Pt currently requires mod assist for LB ADL and min guard assist for functional mobility. Pt continues to c/o intense pain with functional mobility and became dizzy after approximately 5 minutes of activity. Dizziness resolved with reclined positioning and return to bed.  Pt has 24 hour support available from husband. Recommend D/C home with 24 hour supervision/assistance from husband and no OT follow-up post-acute D/C. Pt would benefit from continued OT services while admitted to improve independence with ADL and functional mobility to ensure a safe D/C home. OT will continue to follow acutely. Pt has all DME needs met.    Follow Up Recommendations  No OT follow up;Supervision/Assistance - 24 hour    Equipment Recommendations  None recommended by OT    Recommendations for Other Services       Precautions / Restrictions Precautions Precautions: Knee;Fall;Other (comment) Precaution Booklet Issued: Yes (comment) Precaution Comments: pt with low BP and drops during OOB Restrictions Weight Bearing Restrictions: Yes LLE Weight Bearing: Weight bearing as tolerated      Mobility Bed Mobility Overal bed mobility: Needs Assistance Bed Mobility: Supine to Sit;Sit to Supine     Supine to sit: Min guard Sit to supine: Min assist   General bed mobility comments: Pt slow to progress to edge of bed and required increased assist to lift legs and return to bed.    Transfers Overall transfer level: Needs assistance Equipment used: Rolling walker (2 wheeled) Transfers: Sit to/from Stand Sit to Stand: Min guard          General transfer comment: v/c's for hand placement, increased time, pt with onset of dizziness requiring lengthy standing break.    Balance Overall balance assessment: Needs assistance Sitting-balance support: No upper extremity supported;Feet supported Sitting balance-Leahy Scale: Good     Standing balance support: Single extremity supported;Bilateral upper extremity supported;During functional activity Standing balance-Leahy Scale: Fair Standing balance comment: Able to stand statically without UE support but requires B UE support for dynamic standing.                            ADL Overall ADL's : Needs assistance/impaired     Grooming: Set up;Sitting   Upper Body Bathing: Set up;Sitting   Lower Body Bathing: Moderate assistance;Sit to/from stand   Upper Body Dressing : Set up;Sitting   Lower Body Dressing: Moderate assistance;Sit to/from stand   Toilet Transfer: Min guard;BSC;RW;Ambulation   Toileting- Water quality scientist and Hygiene: Min guard;Sit to/from stand       Functional mobility during ADLs: Min guard;Rolling walker General ADL Comments: Pt educated on safety with ADL post-operatively, fall preventions, and ice for pain management.     Vision Vision Assessment?: No apparent visual deficits   Perception     Praxis      Pertinent Vitals/Pain Pain Assessment: 0-10 Pain Score: 4  Pain Location: L knee Pain Descriptors / Indicators: Discomfort;Tightness;Sore Pain Intervention(s): Premedicated before session     Hand Dominance Right   Extremity/Trunk Assessment Upper Extremity Assessment Upper Extremity Assessment: Overall WFL for tasks assessed   Lower Extremity Assessment Lower Extremity Assessment: LLE deficits/detail  LLE Deficits / Details: Decreased strength and ROM as expected post-operatively.       Communication Communication Communication: No difficulties   Cognition Arousal/Alertness: Awake/alert Behavior During  Therapy: WFL for tasks assessed/performed Overall Cognitive Status: Within Functional Limits for tasks assessed                     General Comments       Exercises       Shoulder Instructions      Home Living Family/patient expects to be discharged to:: Private residence Living Arrangements: Spouse/significant other Available Help at Discharge: Family;Available 24 hours/day Type of Home: House Home Access: Stairs to enter [employer] of Steps: 11 Entrance Stairs-Rails: None Home Layout: One level     Bathroom Shower/Tub: Occupational psychologist: Handicapped height Bathroom Accessibility: Yes   Home Equipment: Environmental consultant - 2 wheels;Bedside commode;Shower seat;Grab bars - tub/shower          Prior Functioning/Environment Level of Independence: Independent                 OT Problem List: Decreased strength;Decreased range of motion;Decreased activity tolerance;Impaired balance (sitting and/or standing);Decreased safety awareness;Decreased knowledge of use of DME or AE;Decreased knowledge of precautions;Pain   OT Treatment/Interventions: Self-care/ADL training;Therapeutic exercise;Energy conservation;Therapeutic activities;Patient/family education;Balance training    OT Goals(Current goals can be found in the care plan section) Acute Rehab OT Goals Patient Stated Goal: home today OT Goal Formulation: With patient Time For Goal Achievement: 05/27/16 Potential to Achieve Goals: Good ADL Goals Pt Will Perform Lower Body Bathing: with supervision;sit to/from stand Pt Will Perform Lower Body Dressing: with supervision;sit to/from stand Pt Will Transfer to Toilet: with modified independence;ambulating;bedside commode Pt Will Perform Toileting - Clothing Manipulation and hygiene: with modified independence;sit to/from stand Pt Will Perform Tub/Shower Transfer: with modified independence;ambulating;rolling walker;Shower transfer;shower seat  OT  Frequency: Min 2X/week   Barriers to D/C:            Co-evaluation              End of Session Equipment Utilized During Treatment: Gait belt;Rolling walker  Activity Tolerance: Patient tolerated treatment well;Treatment limited secondary to medical complications (Comment) (Pt continues to become dizzy OOB for extended periods of tim) Patient left: in bed;with call bell/phone within reach   Time: 1231-1258 OT Time Calculation (min): 27 min Charges:  OT General Charges $OT Visit: 1 Procedure OT Evaluation $OT Eval Moderate Complexity: 1 Procedure OT Treatments $Self Care/Home Management : 8-22 mins  Norman Herrlich, OTR/L [phone] 05/13/2016, 1:58 PM

## 2016-05-13 NOTE — Progress Notes (Signed)
SPORTS MEDICINE AND JOINT REPLACEMENT  Georgena SpurlingStephen Lucey, MD    Laurier Nancyolby Fannie Alomar, PA-C [address], [place], [place]  [id number]                             [phone]   PROGRESS NOTE  Subjective:  negative for Chest Pain  negative for Shortness of Breath  negative for Nausea/Vomiting   negative for Calf Pain  negative for Bowel Movement   Tolerating Diet: yes         Patient reports pain as 5 on 0-10 scale.    Objective: Vital signs in last 24 hours:   Patient Vitals for the past 24 hrs:  BP Temp Temp src Pulse Resp SpO2  05/13/16 0540 (!) 100/52 98.2 F (36.8 C) Oral 72 20 100 %  05/12/16 2334 (!) 100/54 97.7 F (36.5 C) Oral 70 18 99 %  05/12/16 1859 117/62 99.3 F (37.4 C) Oral 75 20 96 %  05/12/16 1831 (!) 111/59 99.3 F (37.4 C) Oral 70 14 100 %  05/12/16 1830 - 98.5 F (36.9 C) - 78 19 98 %  05/12/16 1816 116/69 - - 75 11 98 %  05/12/16 1815 - - - 76 16 99 %  05/12/16 1800 127/70 98.5 F (36.9 C) - 82 (!) 24 99 %  05/12/16 1746 117/72 - - 76 15 100 %  05/12/16 1745 - - - 84 18 99 %  05/12/16 1731 136/75 - - 90 19 99 %  05/12/16 1730 - - - 89 17 99 %  05/12/16 1716 (!) 153/78 - - 88 16 98 %  05/12/16 1715 - - - 93 (!) 22 100 %  05/12/16 1700 (!) 152/91 - - 92 20 96 %  05/12/16 1646 139/88 - - 97 18 99 %  05/12/16 1645 - - - 87 (!) 21 97 %  05/12/16 1631 (!) 150/88 - - 76 12 97 %  05/12/16 1630 - - - 74 14 99 %  05/12/16 1616 (!) 150/95 - - 79 13 96 %  05/12/16 1615 - - - 82 18 100 %  05/12/16 1601 (!) 148/87 - - 77 19 91 %  05/12/16 1600 - - - 80 (!) 24 92 %  05/12/16 1546 (!) 151/87 - - 71 17 94 %  05/12/16 1545 - - - 76 (!) 21 92 %  05/12/16 1531 (!) 159/90 - - 73 17 95 %  05/12/16 1530 - - - 76 17 95 %  05/12/16 1516 (!) 168/85 - - 78 (!) 21 97 %  05/12/16 1515 (!) 168/85 - - 75 16 98 %  05/12/16 1508 (!) 164/88 - - 80 (!) 22 96 %  05/12/16 1501 (!) 173/95 - - 81 12 98 %    @flow {1959:LAST@   Intake/Output from previous day:   11/20 0701 -  11/21 0700 In: 1300 [I.V.:1300] Out: 1000 [Urine:900]   Intake/Output this shift:   No intake/output data recorded.   Intake/Output      11/20 0701 - 11/21 0700 11/21 0701 - 11/22 0700   I.V. (mL/kg) 1300 (18.5)    Total Intake(mL/kg) 1300 (18.5)    Urine (mL/kg/hr) 900    Blood 100    Total Output 1000     Net +300          Urine Occurrence 1 x       LABORATORY DATA:  Recent Labs  05/13/16 0459  WBC  13.5*  HGB 10.7*  HCT 31.9*  PLT 259    Recent Labs  05/13/16 0459  NA 138  K 3.9  CL 103  CO2 24  BUN 13  CREATININE 0.81  GLUCOSE 120*  CALCIUM 8.5*   Lab Results  Component Value Date   INR 0.99 11/23/2012   INR 1.08 06/18/2012   INR 0.96 01/15/2010    Examination:  General appearance: alert, cooperative and no distress Extremities: extremities normal, atraumatic, no cyanosis or edema  Wound Exam: clean, dry, intact   Drainage:  None: wound tissue dry  Motor Exam: Quadriceps and Hamstrings Intact  Sensory Exam: Superficial Peroneal, Deep Peroneal and Tibial normal   Assessment:    1 Day Post-Op  Procedure(s) (LRB): LEFT TOTAL KNEE ARTHROPLASTY (Left)  ADDITIONAL DIAGNOSIS:  Active Problems:   S/P total knee replacement     Plan: Physical Therapy as ordered Weight Bearing as Tolerated (WBAT)  DVT Prophylaxis:  Aspirin  DISCHARGE PLAN: Home  DISCHARGE NEEDS: HHPT   Patient is doing well, will probably keep until tomorrow to improve mobility and pain control         Guy SandiferColby Alan Tyrees Chopin 05/13/2016, 12:44 PM

## 2016-05-13 NOTE — Evaluation (Signed)
Physical Therapy Evaluation Patient Details Name: Melissa Jordan MRN: [id number] DOB: 06-27-1945 Today's Date: 05/13/2016   History of Present Illness  PT s/p elective L TKA on 11/20. Pt with previous back surgeries and bilat uniknees.  Clinical Impression  Pt is s/p TKA resulting in the deficits listed below (see PT Problem List). Pt greatly limited by low BP, lightheadedness with mobility/passing out, and 10/10 L knee pain. Pt only able to tolerate 6' of ambulation this AM and has 11 steps to enter home.  Pt will benefit from skilled PT to increase their independence and safety with mobility to allow discharge to the venue listed below.       Follow Up Recommendations Home health PT;Supervision/Assistance - 24 hour    Equipment Recommendations  None recommended by PT    Recommendations for Other Services       Precautions / Restrictions Precautions Precautions: Knee;Fall;Other (comment) Precaution Booklet Issued: Yes (comment) Precaution Comments: pt with low BP and drops during OOB, pt given HEP Restrictions Weight Bearing Restrictions: Yes LLE Weight Bearing: Weight bearing as tolerated      Mobility  Bed Mobility Overal bed mobility: Needs Assistance Bed Mobility: Supine to Sit     Supine to sit: Min guard     General bed mobility comments: v/c's to complete quad set prior to abducting L LE out of the bed, v/c's for long sit position  Transfers Overall transfer level: Needs assistance Equipment used: Rolling walker (2 wheeled) Transfers: Sit to/from UGI CorporationStand;Stand Pivot Transfers Sit to Stand: Min guard Stand pivot transfers: Min assist       General transfer comment: v/c's for hand placement, increased time, pt with onset of dizziness requiring minA to std pvt to chair and sit  Ambulation/Gait Ambulation/Gait assistance: Min assist Ambulation Distance (Feet): 6 Feet Assistive device: Rolling walker (2 wheeled) Gait Pattern/deviations: Step-to  pattern;Antalgic Gait velocity: slow Gait velocity interpretation: Below normal speed for age/gender General Gait Details: v/c's for sequencing stepping with RW (leading with L LE and staying inside walker) attempted amb x2 however could only amb 6' prior to becoming very lightheaded with the feeling of passing out and requiring to sit. BP 118/58  Stairs            Wheelchair Mobility    Modified Rankin (Stroke Patients Only)       Balance Overall balance assessment:  (needs RW for safe standing/amb due to L TKA)                                           Pertinent Vitals/Pain Pain Assessment: 0-10 Pain Score: 9  Pain Location: L knee, 10 during amb Pain Descriptors / Indicators: Aching;Throbbing;Pressure Pain Intervention(s): Patient requesting pain meds-RN notified;RN gave pain meds during session    Home Living Family/patient expects to be discharged to:: Private residence Living Arrangements: Spouse/significant other Available Help at Discharge: Family;Available 24 hours/day Type of Home: House Home Access: Stairs to enter Entrance Stairs-Rails: None Entrance Stairs-Number of Steps: 11 Home Layout: One level Home Equipment: Walker - 2 wheels;Bedside commode      Prior Function Level of Independence: Independent               Hand Dominance   Dominant Hand: Right    Extremity/Trunk Assessment   Upper Extremity Assessment: Overall WFL for tasks assessed  Lower Extremity Assessment: LLE deficits/detail   LLE Deficits / Details: pt able to initiate active knee flexion and quad set  Cervical / Trunk Assessment: Normal  Communication   Communication: No difficulties  Cognition Arousal/Alertness: Awake/alert Behavior During Therapy: WFL for tasks assessed/performed Overall Cognitive Status: Within Functional Limits for tasks assessed                      General Comments General comments (skin integrity,  edema, etc.): pt with expected L knee swelling, pt unable to keep food or drink down    Exercises Total Joint Exercises Ankle Circles/Pumps: AROM;Both;10 reps Quad Sets: AROM;Left;10 reps;Supine Heel Slides: AAROM;5 reps;Supine Goniometric ROM: 30 deg AAROM of L knee flexion   Assessment/Plan    PT Assessment Patient needs continued PT services  PT Problem List Decreased strength;Decreased range of motion;Decreased activity tolerance;Decreased balance;Decreased mobility;Decreased knowledge of use of DME;Pain          PT Treatment Interventions DME instruction;Gait training;Stair training;Functional mobility training;Therapeutic exercise;Balance training;Therapeutic activities    PT Goals (Current goals can be found in the Care Plan section)  Acute Rehab PT Goals Patient Stated Goal: home today PT Goal Formulation: With patient Time For Goal Achievement: 05/20/16 Potential to Achieve Goals: Good    Frequency 7X/week   Barriers to discharge   has 11 steps to enter home but does have 24/7 assist and bilat HRs    Co-evaluation               End of Session Equipment Utilized During Treatment: Gait belt Activity Tolerance: Patient limited by fatigue (limited by drop in BP with OOB and 10/10 L knee pain) Patient left: in chair;with call bell/phone within reach Nurse Communication: Mobility status         Time: 0722-0807 PT Time Calculation (min) (ACUTE ONLY): 45 min   Charges:   PT Evaluation $PT Eval Moderate Complexity: 1 Procedure PT Treatments $Gait Training: 8-22 mins $Therapeutic Exercise: 8-22 mins   PT G Codes:        Mansoor Hillyard M Georgette Helmer 05/13/2016, 8:25 AM  Lewis ShockAshly Nicloe Frontera, PT, DPT Pager #: [phone] Office #: [phone]

## 2016-05-13 NOTE — Care Management Note (Signed)
Case Management Note  Patient Details  Name: Melissa SenegalDonna H Orlich MRN: [id number] Date of Birth: 02-24-1946  Subjective/Objective:   70 yr old female s/p left total knee revision.                  Action/Plan: Case manager spoke with patient and husband concerning Home Health and DME needs. Patient was preoperatively setup with [facility], no changes. Patient has rolling walker and 3in1., will have family support at discharge.    Expected Discharge Date:   05/14/16               Expected Discharge Plan:  Home w Home Health Services  In-House Referral:  NA  Discharge planning Services  CM Consult  Post Acute Care Choice:  Home Health Choice offered to:  Patient  DME Arranged:  N/A DME Agency:  NA  HH Arranged:  PT [facility]  Status of Service:  Completed, signed off  If discussed at [employer] Length of Stay Meetings, dates discussed:    Additional Comments:  Durenda GuthrieBrady, Bostyn Kunkler Naomi, RN 05/13/2016, 3:57 PM

## 2016-05-13 NOTE — Progress Notes (Signed)
Physical Therapy Treatment Patient Details Name: Melissa Jordan MRN: [id number] DOB: 11-11-1945 Today's Date: 05/13/2016    History of Present Illness PT s/p elective L TKA on 11/20. Pt with previous back surgeries and bilat uniknees.    PT Comments    Pt performed increased gait but remains to c/o pain and dizziness.  Pt not ready to d/c today and will require stair training next session for safe entry into home.    Follow Up Recommendations  Home health PT;Supervision/Assistance - 24 hour     Equipment Recommendations  None recommended by PT    Recommendations for Other Services       Precautions / Restrictions Precautions Precautions: Knee;Fall;Other (comment) Precaution Booklet Issued: Yes (comment) Precaution Comments: pt with low BP and drops during OOB, pt given HEP Restrictions Weight Bearing Restrictions: Yes LLE Weight Bearing: Weight bearing as tolerated    Mobility  Bed Mobility Overal bed mobility: Needs Assistance Bed Mobility: Supine to Sit;Sit to Supine     Supine to sit: Min guard Sit to supine: Min assist   General bed mobility comments: Pt slow to progress to edge of bed and required increased assist to lift legs and return to bed.    Transfers Overall transfer level: Needs assistance Equipment used: Rolling walker (2 wheeled) Transfers: Sit to/from Stand Sit to Stand: Min guard         General transfer comment: v/c's for hand placement, increased time, pt with onset of dizziness requiring lengthy standing break.  Ambulation/Gait Ambulation/Gait assistance: Min assist Ambulation Distance (Feet): 10 Feet (+18 ft) Assistive device: Rolling walker (2 wheeled) Gait Pattern/deviations: Step-to pattern;Antalgic;Trunk flexed Gait velocity: slow Gait velocity interpretation: Below normal speed for age/gender General Gait Details: Cues for sequencing, pursed lip breathing and forward gaze.  Pt with increased dizziness and required chair for  rest break after 10 ft of gait training.     Stairs            Wheelchair Mobility    Modified Rankin (Stroke Patients Only)       Balance Overall balance assessment: Needs assistance   Sitting balance-Leahy Scale: Good       Standing balance-Leahy Scale: Fair                      Cognition Arousal/Alertness: Awake/alert Behavior During Therapy: WFL for tasks assessed/performed Overall Cognitive Status: Within Functional Limits for tasks assessed                      Exercises Total Joint Exercises Ankle Circles/Pumps: AROM;Both;10 reps;Supine Quad Sets: AROM;Left;10 reps;Supine Heel Slides: AAROM;Left;10 reps;Supine Hip ABduction/ADduction: AAROM;Left;10 reps;Supine Straight Leg Raises: AAROM;Left;10 reps;Supine    General Comments        Pertinent Vitals/Pain Pain Assessment: 0-10 Pain Score: 4  Pain Location: L knee Pain Descriptors / Indicators: Discomfort;Tightness;Sore Pain Intervention(s): Premedicated before session    Home Living Family/patient expects to be discharged to:: Private residence Living Arrangements: Spouse/significant other Available Help at Discharge: Family;Available 24 hours/day Type of Home: House Home Access: Stairs to enter Entrance Stairs-Rails: None Home Layout: One level Home Equipment: Environmental consultantWalker - 2 wheels;Bedside commode;Shower seat;Grab bars - tub/shower      Prior Function Level of Independence: Independent          PT Goals (current goals can now be found in the care plan section) Acute Rehab PT Goals Patient Stated Goal: home today Potential to Achieve Goals: Good Progress towards PT goals:  Progressing toward goals    Frequency    7X/week      PT Plan Current plan remains appropriate    Co-evaluation             End of Session Equipment Utilized During Treatment: Gait belt Activity Tolerance: Patient limited by fatigue Patient left: with call bell/phone within reach;with bed  alarm set;in bed     Time: 1202-1224 PT Time Calculation (min) (ACUTE ONLY): 22 min  Charges:  $Gait Training: 8-22 mins                    G Codes:      Melissa Jordan 05/13/2016, 12:39 PM  Melissa Jordan, PTA pager [phone]

## 2016-05-14 DIAGNOSIS — Z23 Encounter for immunization: Secondary | ICD-10-CM | POA: Diagnosis not present

## 2016-05-14 LAB — CBC
HCT: 29.5 % — ABNORMAL LOW (ref 36.0–46.0)
Hemoglobin: 9.6 g/dL — ABNORMAL LOW (ref 12.0–15.0)
MCH: 29.3 pg (ref 26.0–34.0)
MCHC: 32.5 g/dL (ref 30.0–36.0)
MCV: 89.9 fL (ref 78.0–100.0)
Platelets: 218 10*3/uL (ref 150–400)
RBC: 3.28 MIL/uL — ABNORMAL LOW (ref 3.87–5.11)
RDW: 13.3 % (ref 11.5–15.5)
WBC: 12.8 10*3/uL — ABNORMAL HIGH (ref 4.0–10.5)

## 2016-05-14 MED ORDER — INFLUENZA VAC SPLIT QUAD 0.5 ML IM SUSY
0.5000 mL | PREFILLED_SYRINGE | INTRAMUSCULAR | Status: AC
  Administered 2016-05-14: 0.5 mL via INTRAMUSCULAR
  Filled 2016-05-14: qty 0.5

## 2016-05-14 NOTE — Progress Notes (Signed)
SPORTS MEDICINE AND JOINT REPLACEMENT  Georgena SpurlingStephen Lucey, MD    Laurier Nancyolby Uday Jantz, PA-C [address], [place], [place]  [id number]                             [phone]   PROGRESS NOTE  Subjective:  negative for Chest Pain  negative for Shortness of Breath  negative for Nausea/Vomiting   negative for Calf Pain  negative for Bowel Movement   Tolerating Diet: yes         Patient reports pain as 3 on 0-10 scale.    Objective: Vital signs in last 24 hours:   Patient Vitals for the past 24 hrs:  BP Temp Temp src Pulse Resp SpO2  05/14/16 0820 (!) 127/59 - - 79 - -  05/14/16 0408 (!) 118/50 98.7 F (37.1 C) Oral 75 16 98 %  05/13/16 2043 (!) 104/53 98.5 F (36.9 C) Oral 69 16 96 %    @flow {1959:LAST@   Intake/Output from previous day:   11/21 0701 - 11/22 0700 In: 1216 [P.O.:716; I.V.:500] Out: -    Intake/Output this shift:   No intake/output data recorded.   Intake/Output      11/21 0701 - 11/22 0700 11/22 0701 - 11/23 0700   P.O. 716    I.V. (mL/kg) 500 (7.1)    Total Intake(mL/kg) 1216 (17.3)    Urine (mL/kg/hr)     Blood     Total Output       Net +1216          Urine Occurrence 4 x       LABORATORY DATA:  Recent Labs  05/13/16 0459 05/14/16 0553  WBC 13.5* 12.8*  HGB 10.7* 9.6*  HCT 31.9* 29.5*  PLT 259 218    Recent Labs  05/13/16 0459  NA 138  K 3.9  CL 103  CO2 24  BUN 13  CREATININE 0.81  GLUCOSE 120*  CALCIUM 8.5*   Lab Results  Component Value Date   INR 0.99 11/23/2012   INR 1.08 06/18/2012   INR 0.96 01/15/2010    Examination:  General appearance: alert, cooperative and no distress Extremities: extremities normal, atraumatic, no cyanosis or edema  Wound Exam: clean, dry, intact   Drainage:  None: wound tissue dry  Motor Exam: Quadriceps and Hamstrings Intact  Sensory Exam: Superficial Peroneal, Deep Peroneal and Tibial normal   Assessment:    2 Days Post-Op  Procedure(s) (LRB): LEFT TOTAL KNEE ARTHROPLASTY  (Left)  ADDITIONAL DIAGNOSIS:  Active Problems:   S/P total knee replacement     Plan: Physical Therapy as ordered Weight Bearing as Tolerated (WBAT)  DVT Prophylaxis:  Aspirin  DISCHARGE PLAN: Home  DISCHARGE NEEDS: HHPT   Patient doing great, will D/C today         Guy SandiferColby Alan Bridger Pizzi 05/14/2016, 2:57 PM

## 2016-05-14 NOTE — Progress Notes (Signed)
   05/14/16 1400  PT Visit Information  Last PT Received On 05/14/16  Assistance Needed +1  History of Present Illness PT s/p elective L TKA on 11/20. Pt with previous back surgeries and bilat uniknees.  Subjective Data  Subjective "I am going home today."  Patient Stated Goal home today  Precautions  Precautions Knee;Fall;Other (comment)  Precaution Booklet Issued Yes (comment)  Restrictions  Weight Bearing Restrictions Yes  LLE Weight Bearing WBAT  Pain Assessment  Pain Assessment 0-10  Pain Score 5  Pain Location left knee  Pain Descriptors / Indicators Aching;Operative site guarding;Grimacing  Pain Intervention(s) Limited activity within patient's tolerance;Monitored during session;Premedicated before session;Repositioned  Cognition  Arousal/Alertness Awake/alert  Behavior During Therapy WFL for tasks assessed/performed  Overall Cognitive Status Within Functional Limits for tasks assessed  Bed Mobility  Overal bed mobility Needs Assistance  Bed Mobility Supine to Sit;Sit to Supine  Supine to sit Supervision  Sit to supine Min assist  General bed mobility comments Needed no assist for left LE to get OOB but needed assist to get into BEd.   Transfers  Overall transfer level Needs assistance  Equipment used Rolling walker (2 wheeled)  Transfers Sit to/from Stand  Sit to Stand Supervision  Stand pivot transfers Min guard  General transfer comment v/c's for hand placement, increased time  Ambulation/Gait  Ambulation/Gait assistance Min guard  Ambulation Distance (Feet) 50 Feet  Assistive device Rolling walker (2 wheeled)  Gait Pattern/deviations Step-to pattern;Antalgic;Trunk flexed  General Gait Details Better sequencing this pm and doing well with RW. Husband aware of how to assist pt.    Gait velocity slow  Gait velocity interpretation Below normal speed for age/gender  Stairs Yes  Stairs assistance Supervision  Stair Management One rail Left;Step to pattern;Sideways   Number of Stairs 6  General stair comments Pt able to ascend and descend 4 steps with rail with supervision only.  Husband observed and understands how to asssit pt prn.   Balance  Overall balance assessment Needs assistance  Sitting-balance support No upper extremity supported;Feet supported  Sitting balance-Leahy Scale Good  General Comments  General comments (skin integrity, edema, etc.) Placed pt in CPM set to -2-67 degrees  Total Joint Exercises  Goniometric ROM 2-68 degrees  Knee Flexion AROM;AAROM;5 reps;Left;Seated  PT - End of Session  Equipment Utilized During Treatment Gait belt  Activity Tolerance Patient limited by fatigue  Patient left with call bell/phone within reach;in bed;with family/visitor present  Nurse Communication Mobility status  PT - Assessment/Plan  PT Plan Current plan remains appropriate  PT Frequency (ACUTE ONLY) 7X/week  Follow Up Recommendations Home health PT;Supervision/Assistance - 24 hour  PT equipment None recommended by PT  PT Goal Progression  Progress towards PT goals Progressing toward goals  PT Time Calculation  PT Start Time (ACUTE ONLY) 1253  PT Stop Time (ACUTE ONLY) 1320  PT Time Calculation (min) (ACUTE ONLY) 27 min  PT General Charges  $$ ACUTE PT VISIT 1 Procedure  PT Treatments  $Gait Training 8-22 mins  $Self Care/Home Management 8-22  [facility]tersvilleDawn Saphire Jordan,PT Acute Rehabilitation [phone] (pager)

## 2016-05-14 NOTE — Progress Notes (Signed)
Physical Therapy Treatment Patient Details Name: Melissa Jordan MRN: [id number] DOB: 1945-06-26 Today's Date: 05/14/2016    History of Present Illness PT s/p elective L TKA on 11/20. Pt with previous back surgeries and bilat uniknees.    PT Comments    Pt admitted with above diagnosis. Pt currently with functional limitations due to balance, strength and endurance deficits. Pt was able to ambulate further and practice up and down steps.  Pt was still dizzy but was able to tolerate session.  Also performed some exercises as well.  Good progress and anticipate d/c this pm after therapy session.  Pt will benefit from skilled PT to increase their independence and safety with mobility to allow discharge to the venue listed below.    Follow Up Recommendations  Home health PT;Supervision/Assistance - 24 hour     Equipment Recommendations  None recommended by PT    Recommendations for Other Services       Precautions / Restrictions Precautions Precautions: Knee;Fall;Other (comment) Precaution Booklet Issued: Yes (comment) Precaution Comments: pt with low BP and drops during OOB, pt given HEP Restrictions Weight Bearing Restrictions: Yes LLE Weight Bearing: Weight bearing as tolerated    Mobility  Bed Mobility Overal bed mobility: Needs Assistance Bed Mobility: Supine to Sit;Sit to Supine     Supine to sit: Supervision     General bed mobility comments: Up in chair on arrival.  Transfers Overall transfer level: Needs assistance Equipment used: Rolling walker (2 wheeled) Transfers: Sit to/from Stand Sit to Stand: Supervision         General transfer comment: v/c's for hand placement, increased time  Ambulation/Gait Ambulation/Gait assistance: Min guard;+2 safety/equipment Ambulation Distance (Feet): 40 Feet Assistive device: Rolling walker (2 wheeled) Gait Pattern/deviations: Step-to pattern;Antalgic;Trunk flexed Gait velocity: slow Gait velocity interpretation:  Below normal speed for age/gender General Gait Details: Cues for sequencing and forward gaze.  Pt with increased dizziness and required chair for rest break after 40 ft of gait training.     Stairs Stairs: Yes Stairs assistance: Min guard Stair Management: One rail Left;Step to pattern;Sideways Number of Stairs: 4 General stair comments: Pt able to ascend and descend 4 steps with rail with min guard assist.  Dizzy after 4 steps therefore let pt go back to chair and rest.   Wheelchair Mobility    Modified Rankin (Stroke Patients Only)       Balance Overall balance assessment: Needs assistance Sitting-balance support: No upper extremity supported;Feet supported Sitting balance-Leahy Scale: Good     Standing balance support: Bilateral upper extremity supported;During functional activity Standing balance-Leahy Scale: Fair Standing balance comment: Can stand statically without UE support but needs UE support for dynamic activity                    Cognition Arousal/Alertness: Awake/alert Behavior During Therapy: WFL for tasks assessed/performed Overall Cognitive Status: Within Functional Limits for tasks assessed                      Exercises Total Joint Exercises Ankle Circles/Pumps: AROM;Both;10 reps;Supine Quad Sets: AROM;Left;10 reps;Supine Heel Slides: AAROM;Left;10 reps;Supine Hip ABduction/ADduction: AAROM;Left;10 reps;Supine Straight Leg Raises: AAROM;Left;10 reps;Supine Knee Flexion: AROM;AAROM;5 reps;Left;Seated Goniometric ROM: 2-68 degrees AAROM    General Comments General comments (skin integrity, edema, etc.): Placed ice packs around knee at end of treatment.       Pertinent Vitals/Pain Pain Assessment: 0-10 Pain Score: 8  Pain Location: left knee Pain Descriptors / Indicators: Aching;Operative site guarding;Grimacing;Guarding  Pain Intervention(s): Limited activity within patient's tolerance;Monitored during session;Premedicated before  session;Repositioned;Ice applied  VSS    Home Living                      Prior Function            PT Goals (current goals can now be found in the care plan section) Acute Rehab PT Goals Patient Stated Goal: home today Progress towards PT goals: Progressing toward goals    Frequency    7X/week      PT Plan Current plan remains appropriate    Co-evaluation             End of Session Equipment Utilized During Treatment: Gait belt Activity Tolerance: Patient limited by fatigue;Patient limited by pain (limited by dizziness) Patient left: with call bell/phone within reach;in chair;with chair alarm set     Time: [id number] PT Time Calculation (min) (ACUTE ONLY): 30 min  Charges:  $Gait Training: 8-22 mins $Therapeutic Activity: 8-22 mins                    G Codes:      Berline LopesDawn F Damante Spragg 05/14/2016, 10:38 AM Eber Jonesawn Louay Myrie,PT Acute Rehabilitation [phone] (pager)

## 2016-05-14 NOTE — Progress Notes (Signed)
Reviewed discharge instructions/medications with patient and patient's husband.  Answered all of their questions.  Patient is ready for discharge.   

## 2016-05-14 NOTE — Progress Notes (Signed)
Occupational Therapy Treatment Patient Details Name: Melissa SenegalDonna H Galeana MRN: [id number] DOB: 28-Aug-1945 Today's Date: 05/14/2016    History of present illness PT s/p elective L TKA on 11/20. Pt with previous back surgeries and bilat uniknees.   OT comments  Pt progressing well toward OT goals but continues to report significant pain which is limiting ADL participation and activity tolerance. Pt currently requiring min assist with LB ADL, supervision for standing grooming tasks, and min guard assist for shower transfer. Pt educated on safe shower transfer, LB ADL post-operatively, energy conservation, and fall prevention. Pt reports no further questions or concerns. Pt able to tolerate approximately 15 minutes of standing ADL tasks prior to reports of slight dizziness while ambulating back to her recliner. RN present and BP 127/59. Pt reports that dizziness resolved once seated. Pt plans to D/C this afternoon with 24 hour assistance from her husband. OT will continue to follow acutely.   Follow Up Recommendations  No OT follow up;Supervision/Assistance - 24 hour    Equipment Recommendations  None recommended by OT    Recommendations for Other Services      Precautions / Restrictions Precautions Precautions: Knee;Fall;Other (comment) Precaution Comments: pt with low BP and drops during OOB, pt given HEP Restrictions Weight Bearing Restrictions: Yes LLE Weight Bearing: Weight bearing as tolerated       Mobility Bed Mobility Overal bed mobility: Needs Assistance Bed Mobility: Supine to Sit;Sit to Supine     Supine to sit: Supervision        Transfers Overall transfer level: Needs assistance Equipment used: Rolling walker (2 wheeled) Transfers: Sit to/from Stand Sit to Stand: Supervision              Balance Overall balance assessment: Needs assistance Sitting-balance support: Feet supported;No upper extremity supported Sitting balance-Leahy Scale: Good     Standing  balance support: During functional activity;No upper extremity supported;Bilateral upper extremity supported Standing balance-Leahy Scale: Fair Standing balance comment: Able to stand statically with no UE support; requires B UE support for functional mobility.                   ADL Overall ADL's : Needs assistance/impaired     Grooming: Supervision/safety;Standing               Lower Body Dressing: Minimal assistance;Sit to/from stand   Toilet Transfer: Supervision/safety;BSC;RW;Ambulation   Toileting- ArchitectClothing Manipulation and Hygiene: Supervision/safety;Sit to/from stand   Tub/ Shower Transfer: 3 in 1;Walk-in shower;Ambulation;Min guard;Rolling walker   Functional mobility during ADLs: Supervision/safety;Rolling walker General ADL Comments: Educated pt on safe shower transfer with 3-in-1, fall prevention, and safe ADL post-operatively.      Vision                     Perception     Praxis      Cognition   Behavior During Therapy: WFL for tasks assessed/performed Overall Cognitive Status: Within Functional Limits for tasks assessed                       Extremity/Trunk Assessment               Exercises     Shoulder Instructions       General Comments      Pertinent Vitals/ Pain       Pain Assessment: 0-10 Pain Score: 10-Worst pain ever Pain Location: R knee with movement to EOB, improves with ambulation Pain Descriptors / Indicators: Aching;Operative site  guarding;Grimacing;Guarding;Sore Pain Intervention(s): Limited activity within patient's tolerance;Monitored during session;RN gave pain meds during session;Repositioned;Ice applied  Home Living                                          Prior Functioning/Environment              Frequency  Min 2X/week        Progress Toward Goals  OT Goals(current goals can now be found in the care plan section)  Progress towards OT goals: Progressing  toward goals  Acute Rehab OT Goals Patient Stated Goal: home today OT Goal Formulation: With patient Time For Goal Achievement: 05/27/16 Potential to Achieve Goals: Good ADL Goals Pt Will Perform Lower Body Bathing: with supervision;sit to/from stand Pt Will Perform Lower Body Dressing: with supervision;sit to/from stand Pt Will Transfer to Toilet: with modified independence;ambulating;bedside commode Pt Will Perform Toileting - Clothing Manipulation and hygiene: with modified independence;sit to/from stand Pt Will Perform Tub/Shower Transfer: with modified independence;ambulating;rolling walker;Shower transfer;shower seat  Plan Discharge plan remains appropriate    Co-evaluation                 End of Session Equipment Utilized During Treatment: Gait belt;Rolling walker CPM Left Knee CPM Left Knee: Off   Activity Tolerance Patient tolerated treatment well;Other (comment) (Became dizzy at end of ADL session while returning to chair)   Patient Left in chair;with call bell/phone within reach   Nurse Communication Mobility status        Time: [id number] OT Time Calculation (min): 29 min  Charges: OT General Charges $OT Visit: 1 Procedure OT Treatments $Self Care/Home Management : 23-37 mins  Doristine SectionCharity A Sorayah Schrodt, OTR/L [phone] 05/14/2016, 9:33 AM

## 2016-05-14 NOTE — Discharge Summary (Signed)
SPORTS MEDICINE & JOINT REPLACEMENT   Georgena SpurlingStephen Lucey, MD   Laurier Nancyolby Journiee Feldkamp, PA-C [address], [place], [place]  [address]                             [phone]  PATIENT ID: Melissa SenegalDonna H Jordan        MRN:  [id number]          DOB/AGE: 03-06-1946 / 70 y.o.    DISCHARGE SUMMARY  ADMISSION DATE:    05/12/2016 DISCHARGE DATE:   05/14/2016   ADMISSION DIAGNOSIS: primary osteoarthritis left knee status post left unicompartment arthroplasty     DISCHARGE DIAGNOSIS:  primary osteoarthritis left knee status post left unicompartment arthroplasty     ADDITIONAL DIAGNOSIS: Active Problems:   S/P total knee replacement  Past Medical History:  Diagnosis Date  . Allergy   . Anemia    after last surgery in 2016  . Arrhythmia    takes Metoprolol daily  . Asthma   . Bruises easily   . Chronic lower back pain   . Complication of anesthesia    "BP bottoms out after OR" (06/28/2012)  . GERD (gastroesophageal reflux disease)    "one time; really I think it was all due to drinking aspartame" (06/28/2012)  . History of bronchitis    "when I get a bad cold; not chronic; I've had it a few times" (06/28/2012)  . History of stress test    30 yrs. ago- wnl  . Joint pain   . Joint swelling   . Neuromuscular disorder (HCC)    back related   . Osteoarthritis    back, knees  . Osteopenia   . Pneumonia    "couple times in the winters" (06/28/2012), hosp. 2002  . PONV (postoperative nausea and vomiting)    SUPER NAUSEATED  . Seasonal allergies    takes Claritin daily  . Urinary urgency     PROCEDURE: Procedure(s): LEFT TOTAL KNEE ARTHROPLASTY on 05/12/2016  CONSULTS:    HISTORY:  See H&P in chart  HOSPITAL COURSE:  Melissa Jordan is a 70 y.o. admitted on 05/12/2016 and found to have a diagnosis of primary osteoarthritis left knee status post left unicompartment arthroplasty .  After appropriate laboratory studies were obtained  they were taken to the operating room on 05/12/2016 and  underwent Procedure(s): LEFT TOTAL KNEE ARTHROPLASTY.   They were given perioperative antibiotics:  Anti-infectives    Start     Dose/Rate Route Frequency Ordered Stop   05/12/16 2000  clindamycin (CLEOCIN) IVPB 600 mg     600 mg 100 mL/hr over 30 Minutes Intravenous Every 6 hours 05/12/16 1937 05/13/16 0202   05/12/16 0600  clindamycin (CLEOCIN) IVPB 900 mg     900 mg 100 mL/hr over 30 Minutes Intravenous On call to O.R. 05/11/16 1402 05/12/16 1300    .  Patient given tranexamic acid IV or topical and exparel intra-operatively.  Tolerated the procedure well.    POD# 1: Vital signs were stable.  Patient denied Chest pain, shortness of breath, or calf pain.  Patient was started on Lovenox 30 mg subcutaneously twice daily at 8am.  Consults to PT, OT, and care management were made.  The patient was weight bearing as tolerated.  CPM was placed on the operative leg 0-90 degrees for 6-8 hours a day. When out of the CPM, patient was placed in the foam block to achieve full extension. Incentive spirometry was  taught.  Dressing was changed.       POD #2, Continued  PT for ambulation and exercise program.  IV saline locked.  O2 discontinued.    The remainder of the hospital course was dedicated to ambulation and strengthening.   The patient was discharged on 2 Days Post-Op in  Good condition.  Blood products given:none  DIAGNOSTIC STUDIES: Recent vital signs: Patient Vitals for the past 24 hrs:  BP Temp Temp src Pulse Resp SpO2  05/14/16 0820 (!) 127/59 - - 79 - -  05/14/16 0408 (!) 118/50 98.7 F (37.1 C) Oral 75 16 98 %  05/13/16 2043 (!) 104/53 98.5 F (36.9 C) Oral 69 16 96 %       Recent laboratory studies:  Recent Labs  05/13/16 0459 05/14/16 0553  WBC 13.5* 12.8*  HGB 10.7* 9.6*  HCT 31.9* 29.5*  PLT 259 218    Recent Labs  05/13/16 0459  NA 138  K 3.9  CL 103  CO2 24  BUN 13  CREATININE 0.81  GLUCOSE 120*  CALCIUM 8.5*   Lab Results  Component Value Date    INR 0.99 11/23/2012   INR 1.08 06/18/2012   INR 0.96 01/15/2010     Recent Radiographic Studies :  No results found.  DISCHARGE INSTRUCTIONS: Discharge Instructions    CPM    Complete by:  As directed    Continuous passive motion machine (CPM):      Use the CPM from 0 to 90 for 4-6 hours per day.      You may increase by 10 per day.  You may break it up into 2 or 3 sessions per day.      Use CPM for 2 weeks or until you are told to stop.   Call MD / Call 911    Complete by:  As directed    If you experience chest pain or shortness of breath, CALL 911 and be transported to the hospital emergency room.  If you develope a fever above 101 F, pus (white drainage) or increased drainage or redness at the wound, or calf pain, call your surgeon's office.   Constipation Prevention    Complete by:  As directed    Drink plenty of fluids.  Prune juice may be helpful.  You may use a stool softener, such as Colace (over the counter) 100 mg twice a day.  Use MiraLax (over the counter) for constipation as needed.   Diet - low sodium heart healthy    Complete by:  As directed    Discharge instructions    Complete by:  As directed    INSTRUCTIONS AFTER JOINT REPLACEMENT   Remove items at home which could result in a fall. This includes throw rugs or furniture in walking pathways ICE to the affected joint every three hours while awake for 30 minutes at a time, for at least the first 3-5 days, and then as needed for pain and swelling.  Continue to use ice for pain and swelling. You may notice swelling that will progress down to the foot and ankle.  This is normal after surgery.  Elevate your leg when you are not up walking on it.   Continue to use the breathing machine you got in the hospital (incentive spirometer) which will help keep your temperature down.  It is common for your temperature to cycle up and down following surgery, especially at night when you are not up moving around and exerting  yourself.  The breathing machine keeps your lungs expanded and your temperature down.   DIET:  As you were doing prior to hospitalization, we recommend a well-balanced diet.  DRESSING / WOUND CARE / SHOWERING  Keep the surgical dressing until follow up.  The dressing is water proof, so you can shower without any extra covering.  IF THE DRESSING FALLS OFF or the wound gets wet inside, change the dressing with sterile gauze.  Please use good hand washing techniques before changing the dressing.  Do not use any lotions or creams on the incision until instructed by your surgeon.    ACTIVITY  Increase activity slowly as tolerated, but follow the weight bearing instructions below.   No driving for 6 weeks or until further direction given by your physician.  You cannot drive while taking narcotics.  No lifting or carrying greater than 10 lbs. until further directed by your surgeon. Avoid periods of inactivity such as sitting longer than an hour when not asleep. This helps prevent blood clots.  You may return to work once you are authorized by your doctor.     WEIGHT BEARING   Weight bearing as tolerated with assist device (walker, cane, etc) as directed, use it as long as suggested by your surgeon or therapist, typically at least 4-6 weeks.   EXERCISES  Results after joint replacement surgery are often greatly improved when you follow the exercise, range of motion and muscle strengthening exercises prescribed by your doctor. Safety measures are also important to protect the joint from further injury. Any time any of these exercises cause you to have increased pain or swelling, decrease what you are doing until you are comfortable again and then slowly increase them. If you have problems or questions, call your caregiver or physical therapist for advice.   Rehabilitation is important following a joint replacement. After just a few days of immobilization, the muscles of the leg can become weakened  and shrink (atrophy).  These exercises are designed to build up the tone and strength of the thigh and leg muscles and to improve motion. Often times heat used for twenty to thirty minutes before working out will loosen up your tissues and help with improving the range of motion but do not use heat for the first two weeks following surgery (sometimes heat can increase post-operative swelling).   These exercises can be done on a training (exercise) mat, on the floor, on a table or on a bed. Use whatever works the best and is most comfortable for you.    Use music or television while you are exercising so that the exercises are a pleasant break in your day. This will make your life better with the exercises acting as a break in your routine that you can look forward to.   Perform all exercises about fifteen times, three times per day or as directed.  You should exercise both the operative leg and the other leg as well.   Exercises include:   Quad Sets - Tighten up the muscle on the front of the thigh (Quad) and hold for 5-10 seconds.   Straight Leg Raises - With your knee straight (if you were given a brace, keep it on), lift the leg to 60 degrees, hold for 3 seconds, and slowly lower the leg.  Perform this exercise against resistance later as your leg gets stronger.  Leg Slides: Lying on your back, slowly slide your foot toward your buttocks, bending your knee up off the floor (only go  as far as is comfortable). Then slowly slide your foot back down until your leg is flat on the floor again.  Angel Wings: Lying on your back spread your legs to the side as far apart as you can without causing discomfort.  Hamstring Strength:  Lying on your back, push your heel against the floor with your leg straight by tightening up the muscles of your buttocks.  Repeat, but this time bend your knee to a comfortable angle, and push your heel against the floor.  You may put a pillow under the heel to make it more comfortable  if necessary.   A rehabilitation program following joint replacement surgery can speed recovery and prevent re-injury in the future due to weakened muscles. Contact your doctor or a physical therapist for more information on knee rehabilitation.    CONSTIPATION  Constipation is defined medically as fewer than three stools per week and severe constipation as less than one stool per week.  Even if you have a regular bowel pattern at home, your normal regimen is likely to be disrupted due to multiple reasons following surgery.  Combination of anesthesia, postoperative narcotics, change in appetite and fluid intake all can affect your bowels.   YOU MUST use at least one of the following options; they are listed in order of increasing strength to get the job done.  They are all available over the counter, and you may need to use some, POSSIBLY even all of these options:    Drink plenty of fluids (prune juice may be helpful) and high fiber foods Colace 100 mg by mouth twice a day  Senokot for constipation as directed and as needed Dulcolax (bisacodyl), take with full glass of water  Miralax (polyethylene glycol) once or twice a day as needed.  If you have tried all these things and are unable to have a bowel movement in the first 3-4 days after surgery call either your surgeon or your primary doctor.    If you experience loose stools or diarrhea, hold the medications until you stool forms back up.  If your symptoms do not get better within 1 week or if they get worse, check with your doctor.  If you experience "the worst abdominal pain ever" or develop nausea or vomiting, please contact the office immediately for further recommendations for treatment.   ITCHING:  If you experience itching with your medications, try taking only a single pain pill, or even half a pain pill at a time.  You can also use Benadryl over the counter for itching or also to help with sleep.   TED HOSE STOCKINGS:  Use  stockings on both legs until for at least 2 weeks or as directed by physician office. They may be removed at night for sleeping.  MEDICATIONS:  See your medication summary on the "After Visit Summary" that nursing will review with you.  You may have some home medications which will be placed on hold until you complete the course of blood thinner medication.  It is important for you to complete the blood thinner medication as prescribed.  PRECAUTIONS:  If you experience chest pain or shortness of breath - call 911 immediately for transfer to the hospital emergency department.   If you develop a fever greater that 101 F, purulent drainage from wound, increased redness or drainage from wound, foul odor from the wound/dressing, or calf pain - CONTACT YOUR SURGEON.  FOLLOW-UP APPOINTMENTS:  If you do not already have a post-op appointment, please call the office for an appointment to be seen by your surgeon.  Guidelines for how soon to be seen are listed in your "After Visit Summary", but are typically between 1-4 weeks after surgery.  OTHER INSTRUCTIONS:   Knee Replacement:  Do not place pillow under knee, focus on keeping the knee straight while resting. CPM instructions: 0-90 degrees, 2 hours in the morning, 2 hours in the afternoon, and 2 hours in the evening. Place foam block, curve side up under heel at all times except when in CPM or when walking.  DO NOT modify, tear, cut, or change the foam block in any way.  MAKE SURE YOU:  Understand these instructions.  Get help right away if you are not doing well or get worse.    Thank you for letting us be a part of your medical care team.  It is a privilege we respect greatly.  We hope these instructions will help you stay on track for a fast and full recovery!   Increase activity slowly as tolerated    Complete by:  As directed       DISCHARGE MEDICATIONS:     Medication List    STOP taking  these medications   aspirin 81 MG tablet Replaced by:  aspirin 325 MG EC tablet   meloxicam 15 MG tablet Commonly known as:  MOBIC     TAKE these medications   acetaminophen 500 MG tablet Commonly known as:  TYLENOL Take 1,000 mg by mouth 2 (two) times daily as needed. For pain   aspirin 325 MG EC tablet Take 1 tablet (325 mg total) by mouth 2 (two) times daily. Replaces:  aspirin 81 MG tablet   Biotin 5000 MCG Caps Take 5,000 mcg by mouth daily.   CALCIUM PO Take 2 tablets by mouth daily.   CALCIUM-VITAMIN D PO Take 1 tablet by mouth daily.   doxycycline 100 MG tablet Commonly known as:  VIBRA-TABS Take 1 tablet (100 mg total) by mouth 2 (two) times daily.   estradiol 1 MG tablet Commonly known as:  ESTRACE TAKE ONE TABLET BY MOUTH ONCE DAILY   fluticasone 50 MCG/ACT nasal spray Commonly known as:  FLONASE Place 2 sprays into both nostrils daily.   methocarbamol 500 MG tablet Commonly known as:  ROBAXIN Take 1-2 tablets (500-1,000 mg total) by mouth every 6 (six) hours as needed for muscle spasms.   metoprolol 50 MG tablet Commonly known as:  LOPRESSOR TAKE ONE TABLET BY MOUTH TWICE DAILY What changed:  how much to take  how to take this  when to take this  additional instructions   ondansetron 4 MG tablet Commonly known as:  ZOFRAN Take 1 tablet (4 mg total) by mouth every 6 (six) hours as needed for nausea.   oxyCODONE 5 MG immediate release tablet Commonly known as:  Oxy IR/ROXICODONE Take 1-2 tablets (5-10 mg total) by mouth every 3 (three) hours as needed for breakthrough pain.   rOPINIRole 0.5 MG tablet Commonly known as:  REQUIP TAKE ONE TABLET BY MOUTH AT BEDTIME   traMADol 50 MG tablet Commonly known as:  ULTRAM Take 50 mg by mouth every 6 (six) hours as needed for moderate pain.   triamcinolone cream 0.1 % Commonly known as:  KENALOG Apply 1 application topically 4 (four) times daily.            Durable Medical Equipment  Start     Ordered   05/12/16 1938  DME Walker rolling  Once    Question:  Patient needs a walker to treat with the following condition  Answer:  S/P total knee replacement   05/12/16 1937   05/12/16 1938  DME 3 n 1  Once     05/12/16 1937   05/12/16 1938  DME Bedside commode  Once    Question:  Patient needs a bedside commode to treat with the following condition  Answer:  S/P total knee replacement   05/12/16 1937      FOLLOW UP VISIT:   Follow-up Information    [facility] Follow up.   Specialty:  Home Health Services Why:  Someone from [facility] will contact you to arrange start date and time for therapy.   Happy Thanksgiving from [facility] Staff Contact information: [address] [place] [address] [phone]           DISPOSITION: HOME VS. SNF  CONDITION:  Meriel PicaGood   Naseer Hearn Alan Tamarra Geiselman 05/14/2016, 2:59 PM

## 2016-05-14 NOTE — Consult Note (Signed)
            [facility] CM Primary Care Navigator  05/14/2016  Tedra SenegalDonna H Romig [birth year] [id number]    Seen patient at the bedside to identify possible discharge needs.  Patient states she had noted her knee "popping" during an exercise and it started swelling with increased/ worsening pain which led to this admission and surgery.   Patient endorses Dr. Kirtland BouchardK. Tabori [facility] as her primary care provider.    Patient shared [facility] to [facility] without difficulty.   She reports managing her own medications at home straight out of the bottles since she only takes 2-4 pills.   Patient is independent with self care prior to admission/ surgery and is able to drive as well. Her husband Les Pou(Carlton) will be providing transportation to her doctors' appointments when she gets home. Patient's husband will be her primary caregiver at home and daughter Waynetta Sandy(Beth) will be able to assist with care needs per patient.  Plan for discharge is home with home health services (set-up with [facility]) per patient. Patient expressed understanding to call primary care provider's office once discharged, to schedule a post discharge follow-up appointment within a week or sooner if needs arise. Patient letter provided as a reminder.  She denies any further needs or concerns at this time.    For additional questions please contact:  Karin GoldenLorraine A. Brinsley Wence, BSN, RN-BC [facility] PRIMARY CARE Navigator Cell: [phone]

## 2016-05-17 DIAGNOSIS — Z981 Arthrodesis status: Secondary | ICD-10-CM | POA: Diagnosis not present

## 2016-05-17 DIAGNOSIS — E785 Hyperlipidemia, unspecified: Secondary | ICD-10-CM | POA: Diagnosis not present

## 2016-05-17 DIAGNOSIS — Z79891 Long term (current) use of opiate analgesic: Secondary | ICD-10-CM | POA: Diagnosis not present

## 2016-05-17 DIAGNOSIS — E039 Hypothyroidism, unspecified: Secondary | ICD-10-CM | POA: Diagnosis not present

## 2016-05-17 DIAGNOSIS — I10 Essential (primary) hypertension: Secondary | ICD-10-CM | POA: Diagnosis not present

## 2016-05-17 DIAGNOSIS — M8580 Other specified disorders of bone density and structure, unspecified site: Secondary | ICD-10-CM | POA: Diagnosis not present

## 2016-05-17 DIAGNOSIS — Z7982 Long term (current) use of aspirin: Secondary | ICD-10-CM | POA: Diagnosis not present

## 2016-05-17 DIAGNOSIS — Z471 Aftercare following joint replacement surgery: Secondary | ICD-10-CM | POA: Diagnosis not present

## 2016-05-17 DIAGNOSIS — Z96653 Presence of artificial knee joint, bilateral: Secondary | ICD-10-CM | POA: Diagnosis not present

## 2016-05-19 DIAGNOSIS — Z96653 Presence of artificial knee joint, bilateral: Secondary | ICD-10-CM | POA: Diagnosis not present

## 2016-05-19 DIAGNOSIS — Z981 Arthrodesis status: Secondary | ICD-10-CM | POA: Diagnosis not present

## 2016-05-19 DIAGNOSIS — I10 Essential (primary) hypertension: Secondary | ICD-10-CM | POA: Diagnosis not present

## 2016-05-19 DIAGNOSIS — E039 Hypothyroidism, unspecified: Secondary | ICD-10-CM | POA: Diagnosis not present

## 2016-05-19 DIAGNOSIS — Z471 Aftercare following joint replacement surgery: Secondary | ICD-10-CM | POA: Diagnosis not present

## 2016-05-19 DIAGNOSIS — E785 Hyperlipidemia, unspecified: Secondary | ICD-10-CM | POA: Diagnosis not present

## 2016-05-19 DIAGNOSIS — Z79891 Long term (current) use of opiate analgesic: Secondary | ICD-10-CM | POA: Diagnosis not present

## 2016-05-19 DIAGNOSIS — M8580 Other specified disorders of bone density and structure, unspecified site: Secondary | ICD-10-CM | POA: Diagnosis not present

## 2016-05-19 DIAGNOSIS — Z7982 Long term (current) use of aspirin: Secondary | ICD-10-CM | POA: Diagnosis not present

## 2016-05-20 ENCOUNTER — Other Ambulatory Visit: Payer: Self-pay | Admitting: Family Medicine

## 2016-05-20 DIAGNOSIS — I10 Essential (primary) hypertension: Secondary | ICD-10-CM | POA: Diagnosis not present

## 2016-05-20 DIAGNOSIS — Z7982 Long term (current) use of aspirin: Secondary | ICD-10-CM | POA: Diagnosis not present

## 2016-05-20 DIAGNOSIS — Z471 Aftercare following joint replacement surgery: Secondary | ICD-10-CM | POA: Diagnosis not present

## 2016-05-20 DIAGNOSIS — Z981 Arthrodesis status: Secondary | ICD-10-CM | POA: Diagnosis not present

## 2016-05-20 DIAGNOSIS — M8580 Other specified disorders of bone density and structure, unspecified site: Secondary | ICD-10-CM | POA: Diagnosis not present

## 2016-05-20 DIAGNOSIS — Z96653 Presence of artificial knee joint, bilateral: Secondary | ICD-10-CM | POA: Diagnosis not present

## 2016-05-20 DIAGNOSIS — E785 Hyperlipidemia, unspecified: Secondary | ICD-10-CM | POA: Diagnosis not present

## 2016-05-20 DIAGNOSIS — E039 Hypothyroidism, unspecified: Secondary | ICD-10-CM | POA: Diagnosis not present

## 2016-05-20 DIAGNOSIS — Z79891 Long term (current) use of opiate analgesic: Secondary | ICD-10-CM | POA: Diagnosis not present

## 2016-05-21 DIAGNOSIS — Z96653 Presence of artificial knee joint, bilateral: Secondary | ICD-10-CM | POA: Diagnosis not present

## 2016-05-21 DIAGNOSIS — E785 Hyperlipidemia, unspecified: Secondary | ICD-10-CM | POA: Diagnosis not present

## 2016-05-21 DIAGNOSIS — Z471 Aftercare following joint replacement surgery: Secondary | ICD-10-CM | POA: Diagnosis not present

## 2016-05-21 DIAGNOSIS — I10 Essential (primary) hypertension: Secondary | ICD-10-CM | POA: Diagnosis not present

## 2016-05-21 DIAGNOSIS — M8580 Other specified disorders of bone density and structure, unspecified site: Secondary | ICD-10-CM | POA: Diagnosis not present

## 2016-05-21 DIAGNOSIS — E039 Hypothyroidism, unspecified: Secondary | ICD-10-CM | POA: Diagnosis not present

## 2016-05-21 DIAGNOSIS — Z7982 Long term (current) use of aspirin: Secondary | ICD-10-CM | POA: Diagnosis not present

## 2016-05-21 DIAGNOSIS — Z79891 Long term (current) use of opiate analgesic: Secondary | ICD-10-CM | POA: Diagnosis not present

## 2016-05-21 DIAGNOSIS — Z981 Arthrodesis status: Secondary | ICD-10-CM | POA: Diagnosis not present

## 2016-05-22 DIAGNOSIS — M25662 Stiffness of left knee, not elsewhere classified: Secondary | ICD-10-CM | POA: Diagnosis not present

## 2016-05-22 DIAGNOSIS — Z471 Aftercare following joint replacement surgery: Secondary | ICD-10-CM | POA: Diagnosis not present

## 2016-05-22 DIAGNOSIS — G8929 Other chronic pain: Secondary | ICD-10-CM | POA: Diagnosis not present

## 2016-05-22 DIAGNOSIS — Z96652 Presence of left artificial knee joint: Secondary | ICD-10-CM | POA: Diagnosis not present

## 2016-05-22 DIAGNOSIS — M25562 Pain in left knee: Secondary | ICD-10-CM | POA: Diagnosis not present

## 2016-05-22 DIAGNOSIS — R262 Difficulty in walking, not elsewhere classified: Secondary | ICD-10-CM | POA: Diagnosis not present

## 2016-05-26 DIAGNOSIS — M25562 Pain in left knee: Secondary | ICD-10-CM | POA: Diagnosis not present

## 2016-05-26 DIAGNOSIS — G8929 Other chronic pain: Secondary | ICD-10-CM | POA: Diagnosis not present

## 2016-05-26 DIAGNOSIS — R262 Difficulty in walking, not elsewhere classified: Secondary | ICD-10-CM | POA: Diagnosis not present

## 2016-05-26 DIAGNOSIS — M25662 Stiffness of left knee, not elsewhere classified: Secondary | ICD-10-CM | POA: Diagnosis not present

## 2016-05-26 DIAGNOSIS — Z96652 Presence of left artificial knee joint: Secondary | ICD-10-CM | POA: Diagnosis not present

## 2016-05-28 NOTE — Addendum Note (Signed)
Addendum  created 05/28/16 1545 by Cristela BlueKyle Khari Lett, MD   Anesthesia Intra Blocks edited, Sign clinical note

## 2016-05-29 DIAGNOSIS — R262 Difficulty in walking, not elsewhere classified: Secondary | ICD-10-CM | POA: Diagnosis not present

## 2016-05-29 DIAGNOSIS — Z96652 Presence of left artificial knee joint: Secondary | ICD-10-CM | POA: Diagnosis not present

## 2016-05-29 DIAGNOSIS — M25662 Stiffness of left knee, not elsewhere classified: Secondary | ICD-10-CM | POA: Diagnosis not present

## 2016-05-29 DIAGNOSIS — M25562 Pain in left knee: Secondary | ICD-10-CM | POA: Diagnosis not present

## 2016-05-29 DIAGNOSIS — G8929 Other chronic pain: Secondary | ICD-10-CM | POA: Diagnosis not present

## 2016-06-02 DIAGNOSIS — M25662 Stiffness of left knee, not elsewhere classified: Secondary | ICD-10-CM | POA: Diagnosis not present

## 2016-06-02 DIAGNOSIS — Z96652 Presence of left artificial knee joint: Secondary | ICD-10-CM | POA: Diagnosis not present

## 2016-06-02 DIAGNOSIS — M25562 Pain in left knee: Secondary | ICD-10-CM | POA: Diagnosis not present

## 2016-06-02 DIAGNOSIS — R262 Difficulty in walking, not elsewhere classified: Secondary | ICD-10-CM | POA: Diagnosis not present

## 2016-06-02 DIAGNOSIS — G8929 Other chronic pain: Secondary | ICD-10-CM | POA: Diagnosis not present

## 2016-06-04 DIAGNOSIS — R262 Difficulty in walking, not elsewhere classified: Secondary | ICD-10-CM | POA: Diagnosis not present

## 2016-06-04 DIAGNOSIS — Z96652 Presence of left artificial knee joint: Secondary | ICD-10-CM | POA: Diagnosis not present

## 2016-06-04 DIAGNOSIS — M25662 Stiffness of left knee, not elsewhere classified: Secondary | ICD-10-CM | POA: Diagnosis not present

## 2016-06-04 DIAGNOSIS — G8929 Other chronic pain: Secondary | ICD-10-CM | POA: Diagnosis not present

## 2016-06-04 DIAGNOSIS — M25562 Pain in left knee: Secondary | ICD-10-CM | POA: Diagnosis not present

## 2016-06-10 DIAGNOSIS — R262 Difficulty in walking, not elsewhere classified: Secondary | ICD-10-CM | POA: Diagnosis not present

## 2016-06-10 DIAGNOSIS — G8929 Other chronic pain: Secondary | ICD-10-CM | POA: Diagnosis not present

## 2016-06-10 DIAGNOSIS — M25662 Stiffness of left knee, not elsewhere classified: Secondary | ICD-10-CM | POA: Diagnosis not present

## 2016-06-10 DIAGNOSIS — Z96652 Presence of left artificial knee joint: Secondary | ICD-10-CM | POA: Diagnosis not present

## 2016-06-10 DIAGNOSIS — M25562 Pain in left knee: Secondary | ICD-10-CM | POA: Diagnosis not present

## 2016-06-12 DIAGNOSIS — M25662 Stiffness of left knee, not elsewhere classified: Secondary | ICD-10-CM | POA: Diagnosis not present

## 2016-06-12 DIAGNOSIS — M25562 Pain in left knee: Secondary | ICD-10-CM | POA: Diagnosis not present

## 2016-06-12 DIAGNOSIS — Z96652 Presence of left artificial knee joint: Secondary | ICD-10-CM | POA: Diagnosis not present

## 2016-06-12 DIAGNOSIS — R262 Difficulty in walking, not elsewhere classified: Secondary | ICD-10-CM | POA: Diagnosis not present

## 2016-06-12 DIAGNOSIS — G8929 Other chronic pain: Secondary | ICD-10-CM | POA: Diagnosis not present

## 2016-06-18 DIAGNOSIS — G8929 Other chronic pain: Secondary | ICD-10-CM | POA: Diagnosis not present

## 2016-06-18 DIAGNOSIS — Z96652 Presence of left artificial knee joint: Secondary | ICD-10-CM | POA: Diagnosis not present

## 2016-06-18 DIAGNOSIS — M25562 Pain in left knee: Secondary | ICD-10-CM | POA: Diagnosis not present

## 2016-06-18 DIAGNOSIS — R262 Difficulty in walking, not elsewhere classified: Secondary | ICD-10-CM | POA: Diagnosis not present

## 2016-06-18 DIAGNOSIS — M25662 Stiffness of left knee, not elsewhere classified: Secondary | ICD-10-CM | POA: Diagnosis not present

## 2016-06-20 DIAGNOSIS — M25562 Pain in left knee: Secondary | ICD-10-CM | POA: Diagnosis not present

## 2016-06-20 DIAGNOSIS — Z96652 Presence of left artificial knee joint: Secondary | ICD-10-CM | POA: Diagnosis not present

## 2016-06-20 DIAGNOSIS — M25662 Stiffness of left knee, not elsewhere classified: Secondary | ICD-10-CM | POA: Diagnosis not present

## 2016-06-20 DIAGNOSIS — G8929 Other chronic pain: Secondary | ICD-10-CM | POA: Diagnosis not present

## 2016-06-20 DIAGNOSIS — R262 Difficulty in walking, not elsewhere classified: Secondary | ICD-10-CM | POA: Diagnosis not present

## 2016-06-23 DIAGNOSIS — I499 Cardiac arrhythmia, unspecified: Secondary | ICD-10-CM

## 2016-06-23 HISTORY — DX: Cardiac arrhythmia, unspecified: I49.9

## 2016-06-25 ENCOUNTER — Other Ambulatory Visit: Payer: Self-pay | Admitting: Family Medicine

## 2016-06-25 DIAGNOSIS — M25662 Stiffness of left knee, not elsewhere classified: Secondary | ICD-10-CM | POA: Diagnosis not present

## 2016-06-25 DIAGNOSIS — Z96652 Presence of left artificial knee joint: Secondary | ICD-10-CM | POA: Diagnosis not present

## 2016-06-25 DIAGNOSIS — M25562 Pain in left knee: Secondary | ICD-10-CM | POA: Diagnosis not present

## 2016-06-25 DIAGNOSIS — R262 Difficulty in walking, not elsewhere classified: Secondary | ICD-10-CM | POA: Diagnosis not present

## 2016-06-25 DIAGNOSIS — G8929 Other chronic pain: Secondary | ICD-10-CM | POA: Diagnosis not present

## 2016-06-27 DIAGNOSIS — M25562 Pain in left knee: Secondary | ICD-10-CM | POA: Diagnosis not present

## 2016-06-27 DIAGNOSIS — Z96652 Presence of left artificial knee joint: Secondary | ICD-10-CM | POA: Diagnosis not present

## 2016-06-27 DIAGNOSIS — M25662 Stiffness of left knee, not elsewhere classified: Secondary | ICD-10-CM | POA: Diagnosis not present

## 2016-06-27 DIAGNOSIS — G8929 Other chronic pain: Secondary | ICD-10-CM | POA: Diagnosis not present

## 2016-06-27 DIAGNOSIS — R262 Difficulty in walking, not elsewhere classified: Secondary | ICD-10-CM | POA: Diagnosis not present

## 2016-07-01 DIAGNOSIS — R262 Difficulty in walking, not elsewhere classified: Secondary | ICD-10-CM | POA: Diagnosis not present

## 2016-07-01 DIAGNOSIS — M25562 Pain in left knee: Secondary | ICD-10-CM | POA: Diagnosis not present

## 2016-07-01 DIAGNOSIS — Z96652 Presence of left artificial knee joint: Secondary | ICD-10-CM | POA: Diagnosis not present

## 2016-07-01 DIAGNOSIS — M25662 Stiffness of left knee, not elsewhere classified: Secondary | ICD-10-CM | POA: Diagnosis not present

## 2016-07-01 DIAGNOSIS — G8929 Other chronic pain: Secondary | ICD-10-CM | POA: Diagnosis not present

## 2016-07-17 DIAGNOSIS — Z96652 Presence of left artificial knee joint: Secondary | ICD-10-CM | POA: Diagnosis not present

## 2016-07-17 DIAGNOSIS — M25562 Pain in left knee: Secondary | ICD-10-CM | POA: Diagnosis not present

## 2016-07-17 DIAGNOSIS — G8929 Other chronic pain: Secondary | ICD-10-CM | POA: Diagnosis not present

## 2016-07-17 DIAGNOSIS — M25662 Stiffness of left knee, not elsewhere classified: Secondary | ICD-10-CM | POA: Diagnosis not present

## 2016-07-17 DIAGNOSIS — R262 Difficulty in walking, not elsewhere classified: Secondary | ICD-10-CM | POA: Diagnosis not present

## 2016-07-17 DIAGNOSIS — H5203 Hypermetropia, bilateral: Secondary | ICD-10-CM | POA: Diagnosis not present

## 2016-07-25 ENCOUNTER — Other Ambulatory Visit: Payer: Self-pay | Admitting: Family Medicine

## 2016-07-29 NOTE — Progress Notes (Signed)
Pre visit review using our clinic review tool, if applicable. No additional management support is needed unless otherwise documented below in the visit note. 
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
## 2016-07-30 ENCOUNTER — Ambulatory Visit (INDEPENDENT_AMBULATORY_CARE_PROVIDER_SITE_OTHER): Payer: Medicare Other

## 2016-07-30 VITALS — BP 126/78 | HR 60 | Ht 64.0 in | Wt 145.0 lb

## 2016-07-30 DIAGNOSIS — Z0000 Encounter for general adult medical examination without abnormal findings: Secondary | ICD-10-CM | POA: Diagnosis not present

## 2016-07-30 DIAGNOSIS — E2839 Other primary ovarian failure: Secondary | ICD-10-CM | POA: Diagnosis not present

## 2016-07-30 NOTE — Patient Instructions (Addendum)
Continue to eat heart healthy diet (full of fruits, vegetables, whole grains, lean protein, water--limit salt, fat, and sugar intake) and increase physical activity as tolerated.  Continue doing brain stimulating activities (puzzles, reading, adult coloring books, staying active) to keep memory sharp.   Bring a copy of your advance directives to your next office visit.  Schedule mammogram and bone scan  Fall Prevention in the Home Introduction Falls can cause injuries. They can happen to people of all ages. There are many things you can do to make your home safe and to help prevent falls. What can I do on the outside of my home?  Regularly fix the edges of walkways and driveways and fix any cracks.  Remove anything that might make you trip as you walk through a door, such as a raised step or threshold.  Trim any bushes or trees on the path to your home.  Use bright outdoor lighting.  Clear any walking paths of anything that might make someone trip, such as rocks or tools.  Regularly check to see if handrails are loose or broken. Make sure that both sides of any steps have handrails.  Any raised decks and porches should have guardrails on the edges.  Have any leaves, snow, or ice cleared regularly.  Use sand or salt on walking paths during winter.  Clean up any spills in your garage right away. This includes oil or grease spills. What can I do in the bathroom?  Use night lights.  Install grab bars by the toilet and in the tub and shower. Do not use towel bars as grab bars.  Use non-skid mats or decals in the tub or shower.  If you need to sit down in the shower, use a plastic, non-slip stool.  Keep the floor dry. Clean up any water that spills on the floor as soon as it happens.  Remove soap buildup in the tub or shower regularly.  Attach bath mats securely with double-sided non-slip rug tape.  Do not have throw rugs and other things on the floor that can make you  trip. What can I do in the bedroom?  Use night lights.  Make sure that you have a light by your bed that is easy to reach.  Do not use any sheets or blankets that are too big for your bed. They should not hang down onto the floor.  Have a firm chair that has side arms. You can use this for support while you get dressed.  Do not have throw rugs and other things on the floor that can make you trip. What can I do in the kitchen?  Clean up any spills right away.  Avoid walking on wet floors.  Keep items that you use a lot in easy-to-reach places.  If you need to reach something above you, use a strong step stool that has a grab bar.  Keep electrical cords out of the way.  Do not use floor polish or wax that makes floors slippery. If you must use wax, use non-skid floor wax.  Do not have throw rugs and other things on the floor that can make you trip. What can I do with my stairs?  Do not leave any items on the stairs.  Make sure that there are handrails on both sides of the stairs and use them. Fix handrails that are broken or loose. Make sure that handrails are as long as the stairways.  Check any carpeting to make sure that it   it is firmly attached to the stairs. Fix any carpet that is loose or worn.  Avoid having throw rugs at the top or bottom of the stairs. If you do have throw rugs, attach them to the floor with carpet tape.  Make sure that you have a light switch at the top of the stairs and the bottom of the stairs. If you do not have them, ask someone to add them for you. What else can I do to help prevent falls?  Wear shoes that:  Do not have high heels.  Have rubber bottoms.  Are comfortable and fit you well.  Are closed at the toe. Do not wear sandals.  If you use a stepladder:  Make sure that it is fully opened. Do not climb a closed stepladder.  Make sure that both sides of the stepladder are locked into place.  Ask someone to hold it for you, if  possible.  Clearly mark and make sure that you can see:  Any grab bars or handrails.  First and last steps.  Where the edge of each step is.  Use tools that help you move around (mobility aids) if they are needed. These include:  Canes.  Walkers.  Scooters.  Crutches.  Turn on the lights when you go into a dark area. Replace any light bulbs as soon as they burn out.  Set up your furniture so you have a clear path. Avoid moving your furniture around.  If any of your floors are uneven, fix them.  If there are any pets around you, be aware of where they are.  Review your medicines with your doctor. Some medicines can make you feel dizzy. This can increase your chance of falling. Ask your doctor what other things that you can do to help prevent falls. This information is not intended to replace advice given to you by your health care provider. Make sure you discuss any questions you have with your health care provider. Document Released: 04/05/2009 Document Revised: 11/15/2015 Document Reviewed: 07/14/2014  2017 [employer]  Health Maintenance, Female Introduction Adopting a healthy lifestyle and getting preventive care can go a long way to promote health and wellness. Talk with your health care provider about what schedule of regular examinations is right for you. This is a good chance for you to check in with your provider about disease prevention and staying healthy. In between checkups, there are plenty of things you can do on your own. Experts have done a lot of research about which lifestyle changes and preventive measures are most likely to keep you healthy. Ask your health care provider for more information. Weight and diet Eat a healthy diet  Be sure to include plenty of vegetables, fruits, low-fat dairy products, and lean protein.  Do not eat a lot of foods high in solid fats, added sugars, or salt.  Get regular exercise. This is one of the most important things you  can do for your health.  Most adults should exercise for at least 150 minutes each week. The exercise should increase your heart rate and make you sweat (moderate-intensity exercise).  Most adults should also do strengthening exercises at least twice a week. This is in addition to the moderate-intensity exercise. Maintain a healthy weight  Body mass index (BMI) is a measurement that can be used to identify possible weight problems. It estimates body fat based on height and weight. Your health care provider can help determine your BMI and help you achieve or maintain  a healthy weight.  For females 33 years of age and older:  A BMI below 18.5 is considered underweight.  A BMI of 18.5 to 24.9 is normal.  A BMI of 25 to 29.9 is considered overweight.  A BMI of 30 and above is considered obese. Watch levels of cholesterol and blood lipids  You should start having your blood tested for lipids and cholesterol at 71 years of age, then have this test every 5 years.  You may need to have your cholesterol levels checked more often if:  Your lipid or cholesterol levels are high.  You are older than 71 years of age.  You are at high risk for heart disease. Cancer screening Lung Cancer  Lung cancer screening is recommended for adults 38-76 years old who are at high risk for lung cancer because of a history of smoking.  A yearly low-dose CT scan of the lungs is recommended for people who:  Currently smoke.  Have quit within the past 15 years.  Have at least a 30-pack-year history of smoking. A pack year is smoking an average of one pack of cigarettes a day for 1 year.  Yearly screening should continue until it has been 15 years since you quit.  Yearly screening should stop if you develop a health problem that would prevent you from having lung cancer treatment. Breast Cancer  Practice breast self-awareness. This means understanding how your breasts normally appear and feel.  It also  means doing regular breast self-exams. Let your health care provider know about any changes, no matter how small.  If you are in your 20s or 30s, you should have a clinical breast exam (CBE) by a health care provider every 1-3 years as part of a regular health exam.  If you are 40 or older, have a CBE every year. Also consider having a breast X-ray (mammogram) every year.  If you have a family history of breast cancer, talk to your health care provider about genetic screening.  If you are at high risk for breast cancer, talk to your health care provider about having an MRI and a mammogram every year.  Breast cancer gene (BRCA) assessment is recommended for women who have family members with BRCA-related cancers. BRCA-related cancers include:  Breast.  Ovarian.  Tubal.  Peritoneal cancers.  Results of the assessment will determine the need for genetic counseling and BRCA1 and BRCA2 testing. Cervical Cancer  Your health care provider may recommend that you be screened regularly for cancer of the pelvic organs (ovaries, uterus, and vagina). This screening involves a pelvic examination, including checking for microscopic changes to the surface of your cervix (Pap test). You may be encouraged to have this screening done every 3 years, beginning at age 80.  For women ages 75-65, health care providers may recommend pelvic exams and Pap testing every 3 years, or they may recommend the Pap and pelvic exam, combined with testing for human papilloma virus (HPV), every 5 years. Some types of HPV increase your risk of cervical cancer. Testing for HPV may also be done on women of any age with unclear Pap test results.  Other health care providers may not recommend any screening for nonpregnant women who are considered low risk for pelvic cancer and who do not have symptoms. Ask your health care provider if a screening pelvic exam is right for you.  If you have had past treatment for cervical cancer or  a condition that could lead to cancer, you need  Pap tests and screening for cancer for at least 20 years after your treatment. If Pap tests have been discontinued, your risk factors (such as having a new sexual partner) need to be reassessed to determine if screening should resume. Some women have medical problems that increase the chance of getting cervical cancer. In these cases, your health care provider may recommend more frequent screening and Pap tests. Colorectal Cancer  This type of cancer can be detected and often prevented.  Routine colorectal cancer screening usually begins at 71 years of age and continues through 71 years of age.  Your health care provider may recommend screening at an earlier age if you have risk factors for colon cancer.  Your health care provider may also recommend using home test kits to check for hidden blood in the stool.  A small camera at the end of a tube can be used to examine your colon directly (sigmoidoscopy or colonoscopy). This is done to check for the earliest forms of colorectal cancer.  Routine screening usually begins at age 41.  Direct examination of the colon should be repeated every 5-10 years through 71 years of age. However, you may need to be screened more often if early forms of precancerous polyps or small growths are found. Skin Cancer  Check your skin from head to toe regularly.  Tell your health care provider about any new moles or changes in moles, especially if there is a change in a mole's shape or color.  Also tell your health care provider if you have a mole that is larger than the size of a pencil eraser.  Always use sunscreen. Apply sunscreen liberally and repeatedly throughout the day.  Protect yourself by wearing long sleeves, pants, a wide-brimmed hat, and sunglasses whenever you are outside. Heart disease, diabetes, and high blood pressure  High blood pressure causes heart disease and increases the risk of stroke. High  blood pressure is more likely to develop in:  People who have blood pressure in the high end of the normal range (130-139/85-89 mm Hg).  People who are overweight or obese.  People who are African American.  If you are 45-3 years of age, have your blood pressure checked every 3-5 years. If you are 38 years of age or older, have your blood pressure checked every year. You should have your blood pressure measured twice-once when you are at a hospital or clinic, and once when you are not at a hospital or clinic. Record the average of the two measurements. To check your blood pressure when you are not at a hospital or clinic, you can use:  An automated blood pressure machine at a pharmacy.  A home blood pressure monitor.  If you are between 1 years and 6 years old, ask your health care provider if you should take aspirin to prevent strokes.  Have regular diabetes screenings. This involves taking a blood sample to check your fasting blood sugar level.  If you are at a normal weight and have a low risk for diabetes, have this test once every three years after 71 years of age.  If you are overweight and have a high risk for diabetes, consider being tested at a younger age or more often. Preventing infection Hepatitis B  If you have a higher risk for hepatitis B, you should be screened for this virus. You are considered at high risk for hepatitis B if:  You were born in a country where hepatitis B is common. Ask your  health care provider which countries are considered high risk.  Your parents were born in a high-risk country, and you have not been immunized against hepatitis B (hepatitis B vaccine).  You have HIV or AIDS.  You use needles to inject street drugs.  You live with someone who has hepatitis B.  You have had sex with someone who has hepatitis B.  You get hemodialysis treatment.  You take certain medicines for conditions, including cancer, organ transplantation, and  autoimmune conditions. Hepatitis C  Blood testing is recommended for:  Everyone born from [birth year] through 1965.  Anyone with known risk factors for hepatitis C. Sexually transmitted infections (STIs)  You should be screened for sexually transmitted infections (STIs) including gonorrhea and chlamydia if:  You are sexually active and are younger than 71 years of age.  You are older than 71 years of age and your health care provider tells you that you are at risk for this type of infection.  Your sexual activity has changed since you were last screened and you are at an increased risk for chlamydia or gonorrhea. Ask your health care provider if you are at risk.  If you do not have HIV, but are at risk, it may be recommended that you take a prescription medicine daily to prevent HIV infection. This is called pre-exposure prophylaxis (PrEP). You are considered at risk if:  You are sexually active and do not regularly use condoms or know the HIV status of your partner(s).  You take drugs by injection.  You are sexually active with a partner who has HIV. Talk with your health care provider about whether you are at high risk of being infected with HIV. If you choose to begin PrEP, you should first be tested for HIV. You should then be tested every 3 months for as long as you are taking PrEP. Pregnancy  If you are premenopausal and you may become pregnant, ask your health care provider about preconception counseling.  If you may become pregnant, take 400 to 800 micrograms (mcg) of folic acid every day.  If you want to prevent pregnancy, talk to your health care provider about birth control (contraception). Osteoporosis and menopause  Osteoporosis is a disease in which the bones lose minerals and strength with aging. This can result in serious bone fractures. Your risk for osteoporosis can be identified using a bone density scan.  If you are 41 years of age or older, or if you are at risk for  osteoporosis and fractures, ask your health care provider if you should be screened.  Ask your health care provider whether you should take a calcium or vitamin D supplement to lower your risk for osteoporosis.  Menopause may have certain physical symptoms and risks.  Hormone replacement therapy may reduce some of these symptoms and risks. Talk to your health care provider about whether hormone replacement therapy is right for you. Follow these instructions at home:  Schedule regular health, dental, and eye exams.  Stay current with your immunizations.  Do not use any tobacco products including cigarettes, chewing tobacco, or electronic cigarettes.  If you are pregnant, do not drink alcohol.  If you are breastfeeding, limit how much and how often you drink alcohol.  Limit alcohol intake to no more than 1 drink per day for nonpregnant women. One drink equals 12 ounces of beer, 5 ounces of wine, or 1 ounces of hard liquor.  Do not use street drugs.  Do not share needles.  Ask your  health care provider for help if you need support or information about quitting drugs.  Tell your health care provider if you often feel depressed.  Tell your health care provider if you have ever been abused or do not feel safe at home. This information is not intended to replace advice given to you by your health care provider. Make sure you discuss any questions you have with your health care provider. Document Released: 12/23/2010 Document Revised: 11/15/2015 Document Reviewed: 03/13/2015  2017 [employer]

## 2016-07-30 NOTE — Progress Notes (Signed)
Reviewed documentation per RN- agree w/ above.  Pt to see me for CPE

## 2016-08-28 ENCOUNTER — Other Ambulatory Visit: Payer: Self-pay | Admitting: Family Medicine

## 2016-09-11 ENCOUNTER — Other Ambulatory Visit: Payer: Self-pay | Admitting: Family Medicine

## 2016-09-11 DIAGNOSIS — Z1231 Encounter for screening mammogram for malignant neoplasm of breast: Secondary | ICD-10-CM

## 2016-10-16 ENCOUNTER — Encounter: Payer: Self-pay | Admitting: General Practice

## 2016-10-16 ENCOUNTER — Ambulatory Visit
Admission: RE | Admit: 2016-10-16 | Discharge: 2016-10-16 | Disposition: A | Discharge status: Home / Self Care | Payer: Medicare Other | Source: Ambulatory Visit | Attending: Family Medicine | Admitting: Family Medicine

## 2016-10-16 DIAGNOSIS — M85851 Other specified disorders of bone density and structure, right thigh: Secondary | ICD-10-CM | POA: Diagnosis not present

## 2016-10-16 DIAGNOSIS — Z1231 Encounter for screening mammogram for malignant neoplasm of breast: Secondary | ICD-10-CM

## 2016-10-16 DIAGNOSIS — Z780 Asymptomatic menopausal state: Secondary | ICD-10-CM | POA: Diagnosis not present

## 2016-10-16 DIAGNOSIS — E2839 Other primary ovarian failure: Secondary | ICD-10-CM

## 2016-10-17 ENCOUNTER — Other Ambulatory Visit: Payer: Self-pay | Admitting: Family Medicine

## 2016-10-17 DIAGNOSIS — R928 Other abnormal and inconclusive findings on diagnostic imaging of breast: Secondary | ICD-10-CM

## 2016-10-21 DIAGNOSIS — M47816 Spondylosis without myelopathy or radiculopathy, lumbar region: Secondary | ICD-10-CM | POA: Diagnosis not present

## 2016-10-22 ENCOUNTER — Ambulatory Visit
Admission: RE | Admit: 2016-10-22 | Discharge: 2016-10-22 | Disposition: A | Discharge status: Home / Self Care | Payer: Medicare Other | Source: Ambulatory Visit | Attending: Family Medicine | Admitting: Family Medicine

## 2016-10-22 DIAGNOSIS — R928 Other abnormal and inconclusive findings on diagnostic imaging of breast: Secondary | ICD-10-CM

## 2016-10-22 DIAGNOSIS — R922 Inconclusive mammogram: Secondary | ICD-10-CM | POA: Diagnosis not present

## 2016-10-24 ENCOUNTER — Other Ambulatory Visit: Payer: Self-pay | Admitting: Family Medicine

## 2016-11-05 ENCOUNTER — Other Ambulatory Visit: Payer: Self-pay | Admitting: Family Medicine

## 2016-11-05 DIAGNOSIS — Z96652 Presence of left artificial knee joint: Secondary | ICD-10-CM | POA: Diagnosis not present

## 2016-11-05 DIAGNOSIS — Z471 Aftercare following joint replacement surgery: Secondary | ICD-10-CM | POA: Diagnosis not present

## 2016-11-25 ENCOUNTER — Other Ambulatory Visit: Payer: Self-pay | Admitting: Family Medicine

## 2016-12-22 ENCOUNTER — Other Ambulatory Visit: Payer: Self-pay | Admitting: Family Medicine

## 2016-12-22 ENCOUNTER — Encounter: Payer: Medicare Other | Admitting: Family Medicine

## 2017-01-14 ENCOUNTER — Encounter: Payer: Medicare Other | Admitting: Family Medicine

## 2017-01-27 ENCOUNTER — Other Ambulatory Visit: Payer: Self-pay | Admitting: Family Medicine

## 2017-01-29 DIAGNOSIS — M47816 Spondylosis without myelopathy or radiculopathy, lumbar region: Secondary | ICD-10-CM | POA: Diagnosis not present

## 2017-01-29 DIAGNOSIS — M1710 Unilateral primary osteoarthritis, unspecified knee: Secondary | ICD-10-CM | POA: Diagnosis not present

## 2017-02-23 ENCOUNTER — Other Ambulatory Visit: Payer: Self-pay | Admitting: Family Medicine

## 2017-03-06 ENCOUNTER — Emergency Department (HOSPITAL_COMMUNITY)
Admission: EM | Admit: 2017-03-06 | Discharge: 2017-03-06 | Disposition: A | Discharge status: Home / Self Care | Payer: Medicare Other | Attending: Emergency Medicine | Admitting: Emergency Medicine

## 2017-03-06 ENCOUNTER — Emergency Department (HOSPITAL_COMMUNITY): Payer: Medicare Other

## 2017-03-06 ENCOUNTER — Encounter (HOSPITAL_COMMUNITY): Payer: Self-pay

## 2017-03-06 DIAGNOSIS — Z79899 Other long term (current) drug therapy: Secondary | ICD-10-CM | POA: Diagnosis not present

## 2017-03-06 DIAGNOSIS — I4891 Unspecified atrial fibrillation: Secondary | ICD-10-CM

## 2017-03-06 DIAGNOSIS — I10 Essential (primary) hypertension: Secondary | ICD-10-CM | POA: Insufficient documentation

## 2017-03-06 DIAGNOSIS — R069 Unspecified abnormalities of breathing: Secondary | ICD-10-CM | POA: Diagnosis not present

## 2017-03-06 DIAGNOSIS — J45998 Other asthma: Secondary | ICD-10-CM | POA: Diagnosis not present

## 2017-03-06 DIAGNOSIS — Z7901 Long term (current) use of anticoagulants: Secondary | ICD-10-CM | POA: Diagnosis not present

## 2017-03-06 DIAGNOSIS — Z7982 Long term (current) use of aspirin: Secondary | ICD-10-CM | POA: Insufficient documentation

## 2017-03-06 DIAGNOSIS — Z96652 Presence of left artificial knee joint: Secondary | ICD-10-CM | POA: Diagnosis not present

## 2017-03-06 DIAGNOSIS — R002 Palpitations: Secondary | ICD-10-CM | POA: Diagnosis present

## 2017-03-06 DIAGNOSIS — R0602 Shortness of breath: Secondary | ICD-10-CM | POA: Insufficient documentation

## 2017-03-06 LAB — CBC WITH DIFFERENTIAL/PLATELET
Basophils Absolute: 0.1 10*3/uL (ref 0.0–0.1)
Basophils Relative: 1 %
Eosinophils Absolute: 0.2 10*3/uL (ref 0.0–0.7)
Eosinophils Relative: 2 %
HCT: 41.7 % (ref 36.0–46.0)
Hemoglobin: 13.6 g/dL (ref 12.0–15.0)
Lymphocytes Relative: 14 %
Lymphs Abs: 1.4 10*3/uL (ref 0.7–4.0)
MCH: 28.9 pg (ref 26.0–34.0)
MCHC: 32.6 g/dL (ref 30.0–36.0)
MCV: 88.7 fL (ref 78.0–100.0)
Monocytes Absolute: 0.8 10*3/uL (ref 0.1–1.0)
Monocytes Relative: 8 %
Neutro Abs: 8 10*3/uL — ABNORMAL HIGH (ref 1.7–7.7)
Neutrophils Relative %: 75 %
Platelets: 243 10*3/uL (ref 150–400)
RBC: 4.7 MIL/uL (ref 3.87–5.11)
RDW: 13.6 % (ref 11.5–15.5)
WBC: 10.4 10*3/uL (ref 4.0–10.5)

## 2017-03-06 LAB — COMPREHENSIVE METABOLIC PANEL
ALT: 13 U/L — ABNORMAL LOW (ref 14–54)
AST: 18 U/L (ref 15–41)
Albumin: 3.8 g/dL (ref 3.5–5.0)
Alkaline Phosphatase: 70 U/L (ref 38–126)
Anion gap: 7 (ref 5–15)
BUN: 22 mg/dL — ABNORMAL HIGH (ref 6–20)
CO2: 21 mmol/L — ABNORMAL LOW (ref 22–32)
Calcium: 8.9 mg/dL (ref 8.9–10.3)
Chloride: 111 mmol/L (ref 101–111)
Creatinine, Ser: 0.73 mg/dL (ref 0.44–1.00)
GFR calc Af Amer: >60 mL/min (ref >60)
GFR calc non Af Amer: >60 mL/min (ref >60)
Glucose, Bld: 97 mg/dL (ref 65–99)
Potassium: 3.9 mmol/L (ref 3.5–5.1)
Sodium: 139 mmol/L (ref 135–145)
Total Bilirubin: 0.5 mg/dL (ref 0.3–1.2)
Total Protein: 6.1 g/dL — ABNORMAL LOW (ref 6.5–8.1)

## 2017-03-06 LAB — URINALYSIS, ROUTINE W REFLEX MICROSCOPIC
Bilirubin Urine: NEGATIVE
Glucose, UA: NEGATIVE mg/dL
Ketones, ur: NEGATIVE mg/dL
Leukocytes, UA: NEGATIVE
Nitrite: NEGATIVE
Protein, ur: NEGATIVE mg/dL
Specific Gravity, Urine: 1.004 — ABNORMAL LOW (ref 1.005–1.030)
WBC, UA: NONE SEEN WBC/hpf (ref 0–5)
pH: 8 (ref 5.0–8.0)

## 2017-03-06 LAB — TSH: TSH: 2.364 u[IU]/mL (ref 0.350–4.500)

## 2017-03-06 LAB — TROPONIN I: Troponin I: <0.03 ng/mL (ref <0.03)

## 2017-03-06 LAB — MAGNESIUM: Magnesium: 1.9 mg/dL (ref 1.7–2.4)

## 2017-03-06 MED ORDER — AMIODARONE HCL 200 MG PO TABS
200.0000 mg | ORAL_TABLET | Freq: Once | ORAL | Status: AC
  Administered 2017-03-06: 200 mg via ORAL
  Filled 2017-03-06: qty 1

## 2017-03-06 MED ORDER — APIXABAN 5 MG PO TABS
5.0000 mg | ORAL_TABLET | Freq: Once | ORAL | Status: AC
  Administered 2017-03-06: 5 mg via ORAL
  Filled 2017-03-06 (×2): qty 1

## 2017-03-06 MED ORDER — APIXABAN 5 MG PO TABS: 5.0000 mg | ORAL_TABLET | Freq: Two times a day (BID) | ORAL | 0 refills | Status: DC

## 2017-03-06 MED ORDER — AMIODARONE HCL 200 MG PO TABS: 200.0000 mg | ORAL_TABLET | Freq: Every day | ORAL | 0 refills | Status: DC
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
## 2017-03-06 NOTE — ED Notes (Signed)
Patient given discharge instructions and verbalized understanding.  Patient stable to discharge at this time.  Patient is alert and oriented to baseline.  No distressed noted at this time.  All belongings taken with the patient at discharge.   

## 2017-03-06 NOTE — ED Triage Notes (Signed)
[facility]- pt coming from home c/o SOB and generalized weakness. She reports palpitations. HR initially 160, pt was cool and clammy. 600cc of fluid administered PTA. Pt was able to vagal herself down to a rate of 84 with EMS. Pt arrives AOX4, skin warm and dry. Vitals stable.

## 2017-03-06 NOTE — Discharge Instructions (Signed)
Information on my medicine - ELIQUIS® (apixaban) ° °This medication education was reviewed with me or my healthcare representative as part of my discharge preparation.  The pharmacist that spoke with me during my hospital stay was:  Jayven Naill G, RPH ° °Why was Eliquis® prescribed for you? °Eliquis® was prescribed for you to reduce the risk of forming blood clots that can cause a stroke if you have a medical condition called atrial fibrillation (a type of irregular heartbeat) OR to reduce the risk of a blood clots forming after orthopedic surgery. ° °What do You need to know about Eliquis® ? °Take your Eliquis® TWICE DAILY - one tablet in the morning and one tablet in the evening with or without food.  It would be best to take the doses about the same time each day. ° °If you have difficulty swallowing the tablet whole please discuss with your pharmacist how to take the medication safely. ° °Take Eliquis® exactly as prescribed by your doctor and DO NOT stop taking Eliquis® without talking to the doctor who prescribed the medication.  Stopping may increase your risk of developing a new clot or stroke.  Refill your prescription before you run out. ° °After discharge, you should have regular check-up appointments with your healthcare provider that is prescribing your Eliquis®.  In the future your dose may need to be changed if your kidney function or weight changes by a significant amount or as you get older. ° °What do you do if you miss a dose? °If you miss a dose, take it as soon as you remember on the same day and resume taking twice daily.  Do not take more than one dose of ELIQUIS at the same time. ° °Important Safety Information °A possible side effect of Eliquis® is bleeding. You should call your healthcare provider right away if you experience any of the following: °? Bleeding from an injury or your nose that does not stop. °? Unusual colored urine (red or dark brown) or unusual colored stools (red or  black). °? Unusual bruising for unknown reasons. °? A serious fall or if you hit your head (even if there is no bleeding). ° °Some medicines may interact with Eliquis® and might increase your risk of bleeding or clotting while on Eliquis®. To help avoid this, consult your healthcare provider or pharmacist prior to using any new prescription or non-prescription medications, including herbals, vitamins, non-steroidal anti-inflammatory drugs (NSAIDs) and supplements. ° °This website has more information on Eliquis® (apixaban): www.Eliquis.com. ° ° °

## 2017-03-09 ENCOUNTER — Other Ambulatory Visit: Payer: Self-pay | Admitting: Cardiology

## 2017-03-09 DIAGNOSIS — R200 Anesthesia of skin: Secondary | ICD-10-CM | POA: Diagnosis not present

## 2017-03-09 DIAGNOSIS — I480 Paroxysmal atrial fibrillation: Secondary | ICD-10-CM | POA: Diagnosis not present

## 2017-03-09 DIAGNOSIS — I10 Essential (primary) hypertension: Secondary | ICD-10-CM | POA: Diagnosis not present

## 2017-03-09 DIAGNOSIS — E039 Hypothyroidism, unspecified: Secondary | ICD-10-CM | POA: Diagnosis not present

## 2017-03-09 DIAGNOSIS — R002 Palpitations: Secondary | ICD-10-CM | POA: Diagnosis not present

## 2017-03-09 DIAGNOSIS — R079 Chest pain, unspecified: Secondary | ICD-10-CM

## 2017-03-10 ENCOUNTER — Ambulatory Visit (INDEPENDENT_AMBULATORY_CARE_PROVIDER_SITE_OTHER): Payer: Medicare Other | Admitting: Family Medicine

## 2017-03-10 ENCOUNTER — Encounter: Payer: Self-pay | Admitting: Family Medicine

## 2017-03-10 VITALS — BP 122/80 | HR 80 | Temp 98.1°F | Resp 17 | Ht 64.0 in | Wt 153.4 lb

## 2017-03-10 DIAGNOSIS — Z0000 Encounter for general adult medical examination without abnormal findings: Secondary | ICD-10-CM | POA: Diagnosis not present

## 2017-03-10 DIAGNOSIS — E785 Hyperlipidemia, unspecified: Secondary | ICD-10-CM | POA: Diagnosis not present

## 2017-03-10 DIAGNOSIS — I4891 Unspecified atrial fibrillation: Secondary | ICD-10-CM

## 2017-03-10 DIAGNOSIS — Z23 Encounter for immunization: Secondary | ICD-10-CM | POA: Diagnosis not present

## 2017-03-10 LAB — HEPATIC FUNCTION PANEL
ALT: 10 U/L (ref 0–35)
AST: 13 U/L (ref 0–37)
Albumin: 4.2 g/dL (ref 3.5–5.2)
Alkaline Phosphatase: 71 U/L (ref 39–117)
Bilirubin, Direct: 0.1 mg/dL (ref 0.0–0.3)
Total Bilirubin: 0.6 mg/dL (ref 0.2–1.2)
Total Protein: 6.3 g/dL (ref 6.0–8.3)

## 2017-03-10 LAB — LIPID PANEL
Cholesterol: 202 mg/dL — ABNORMAL HIGH (ref 0–200)
HDL: 72.3 mg/dL (ref >39.00)
LDL Cholesterol: 104 mg/dL — ABNORMAL HIGH (ref 0–99)
NonHDL: 130.06
Total CHOL/HDL Ratio: 3
Triglycerides: 129 mg/dL (ref 0.0–149.0)
VLDL: 25.8 mg/dL (ref 0.0–40.0)

## 2017-03-10 LAB — BASIC METABOLIC PANEL
BUN: 23 mg/dL (ref 6–23)
CO2: 29 mEq/L (ref 19–32)
Calcium: 9.1 mg/dL (ref 8.4–10.5)
Chloride: 103 mEq/L (ref 96–112)
Creatinine, Ser: 0.66 mg/dL (ref 0.40–1.20)
GFR: 93.8 mL/min (ref >60.00)
Glucose, Bld: 85 mg/dL (ref 70–99)
Potassium: 4.2 mEq/L (ref 3.5–5.1)
Sodium: 139 mEq/L (ref 135–145)

## 2017-03-10 LAB — CBC WITH DIFFERENTIAL/PLATELET
Basophils Absolute: 0.1 10*3/uL (ref 0.0–0.1)
Basophils Relative: 0.7 % (ref 0.0–3.0)
Eosinophils Absolute: 0.2 10*3/uL (ref 0.0–0.7)
Eosinophils Relative: 2.5 % (ref 0.0–5.0)
HCT: 41.7 % (ref 36.0–46.0)
Hemoglobin: 13.7 g/dL (ref 12.0–15.0)
Lymphocytes Relative: 21.6 % (ref 12.0–46.0)
Lymphs Abs: 1.9 10*3/uL (ref 0.7–4.0)
MCHC: 32.8 g/dL (ref 30.0–36.0)
MCV: 90.3 fl (ref 78.0–100.0)
Monocytes Absolute: 0.7 10*3/uL (ref 0.1–1.0)
Monocytes Relative: 7.5 % (ref 3.0–12.0)
Neutro Abs: 5.9 10*3/uL (ref 1.4–7.7)
Neutrophils Relative %: 67.7 % (ref 43.0–77.0)
Platelets: 212 10*3/uL (ref 150.0–400.0)
RBC: 4.61 Mil/uL (ref 3.87–5.11)
RDW: 13.6 % (ref 11.5–15.5)
WBC: 8.8 10*3/uL (ref 4.0–10.5)

## 2017-03-10 LAB — TSH: TSH: 3.36 u[IU]/mL (ref 0.35–4.50)

## 2017-03-10 MED ORDER — ASPIRIN EC 81 MG PO TBEC: 81.0000 mg | DELAYED_RELEASE_TABLET | Freq: Every day | ORAL | 3 refills | Status: DC

## 2017-03-10 NOTE — Assessment & Plan Note (Signed)
Chronic problem.  Tolerating statin w/o difficulty.  Check labs.  Adjust meds prn  

## 2017-03-10 NOTE — Progress Notes (Signed)
Pre visit review using our clinic review tool, if applicable. No additional management support is needed unless otherwise documented below in the visit note. 

## 2017-03-10 NOTE — Patient Instructions (Signed)
Follow up in 6 months to recheck cholesterol We'll notify you of your lab results and make any changes if needed Keep up the good work!  You look great! Call with any questions or concerns Hang in there!!!

## 2017-03-10 NOTE — Assessment & Plan Note (Signed)
New onset.  Now seeing Dr Sharyn Lull and on Eliquis and Amiodarone.  Asymptomatic at this time.  Will follow along and assist as able

## 2017-03-10 NOTE — Progress Notes (Signed)
   Subjective:    Patient ID: Melissa Jordan, female    DOB: January 31, 1946, 71 y.o.   MRN: [id number]  HPI CPE- UTD on colonoscopy, mammo, DEXA.  Due for Flu and Prevnar.   Review of Systems Patient reports no vision/ hearing changes, adenopathy,fever, weight change,  persistant/recurrent hoarseness , swallowing issues, chest pain, edema, persistant/recurrent cough, hemoptysis, dyspnea (rest/exertional/paroxysmal nocturnal), gastrointestinal bleeding (melena, rectal bleeding), abdominal pain, significant heartburn, bowel changes, Gyn symptoms (abnormal  bleeding, pain),  syncope, focal weakness, memory loss, numbness & tingling, skin/hair/nail changes, abnormal bruising or bleeding, anxiety, or depression.   + palpitations- new dx of Afib, now following w/ Dr Sharyn Lull + urinary incontinence- both stress and overflow    Objective:   Physical Exam General Appearance:    Alert, cooperative, no distress, appears stated age  Head:    Normocephalic, without obvious abnormality, atraumatic  Eyes:    PERRL, conjunctiva/corneas clear, EOM's intact, fundi    benign, both eyes  Ears:    Normal TM's and external ear canals, both ears  Nose:   Nares normal, septum midline, mucosa normal, no drainage    or sinus tenderness  Throat:   Lips, mucosa, and tongue normal; teeth and gums normal  Neck:   Supple, symmetrical, trachea midline, no adenopathy;    Thyroid: no enlargement/tenderness/nodules  Back:     Symmetric, no curvature, ROM normal, no CVA tenderness  Lungs:     Clear to auscultation bilaterally, respirations unlabored  Chest Wall:    No tenderness or deformity   Heart:    Regular rate and rhythm, S1 and S2 normal, no murmur, rub   or gallop  Breast Exam:    Deferred to mammo  Abdomen:     Soft, non-tender, bowel sounds active all four quadrants,    no masses, no organomegaly  Genitalia:    Deferred  Rectal:    Extremities:   Extremities normal, atraumatic, no cyanosis or edema  Pulses:    2+ and symmetric all extremities  Skin:   Skin color, texture, turgor normal, no rashes or lesions  Lymph nodes:   Cervical, supraclavicular, and axillary nodes normal  Neurologic:   CNII-XII intact, normal strength, sensation and reflexes    throughout          Assessment & Plan:

## 2017-03-10 NOTE — Assessment & Plan Note (Signed)
Pt's PE WNL.  UTD on mammo, DEXA, colonoscopy.  Due for flu and prevnar- will get flu today.  Check labs.  Anticipatory guidance provided.

## 2017-03-11 ENCOUNTER — Encounter: Payer: Self-pay | Admitting: General Practice

## 2017-03-20 ENCOUNTER — Ambulatory Visit (HOSPITAL_COMMUNITY)
Admission: RE | Admit: 2017-03-20 | Discharge: 2017-03-20 | Disposition: A | Discharge status: Home / Self Care | Payer: Medicare Other | Source: Ambulatory Visit | Attending: Cardiology | Admitting: Cardiology

## 2017-03-20 ENCOUNTER — Encounter (HOSPITAL_COMMUNITY): Payer: Self-pay

## 2017-03-20 DIAGNOSIS — R079 Chest pain, unspecified: Secondary | ICD-10-CM | POA: Insufficient documentation

## 2017-03-20 DIAGNOSIS — I480 Paroxysmal atrial fibrillation: Secondary | ICD-10-CM | POA: Diagnosis not present

## 2017-03-20 DIAGNOSIS — I10 Essential (primary) hypertension: Secondary | ICD-10-CM | POA: Diagnosis not present

## 2017-03-20 DIAGNOSIS — R9431 Abnormal electrocardiogram [ECG] [EKG]: Secondary | ICD-10-CM | POA: Diagnosis not present

## 2017-03-20 DIAGNOSIS — R002 Palpitations: Secondary | ICD-10-CM | POA: Diagnosis not present

## 2017-03-20 DIAGNOSIS — R6884 Jaw pain: Secondary | ICD-10-CM | POA: Diagnosis not present

## 2017-03-20 MED ORDER — REGADENOSON 0.4 MG/5ML IV SOLN
0.4000 mg | Freq: Once | INTRAVENOUS | Status: AC
  Administered 2017-03-20: 0.4 mg via INTRAVENOUS

## 2017-03-20 MED ORDER — REGADENOSON 0.4 MG/5ML IV SOLN
INTRAVENOUS | Status: AC
  Filled 2017-03-20: qty 5

## 2017-03-20 MED ORDER — TECHNETIUM TC 99M TETROFOSMIN IV KIT
10.0000 | PACK | Freq: Once | INTRAVENOUS | Status: AC | PRN
  Administered 2017-03-20: 10 via INTRAVENOUS

## 2017-03-20 MED ORDER — TECHNETIUM TC 99M TETROFOSMIN IV KIT
30.0000 | PACK | Freq: Once | INTRAVENOUS | Status: AC | PRN
  Administered 2017-03-20: 30 via INTRAVENOUS

## 2017-05-18 ENCOUNTER — Other Ambulatory Visit: Payer: Self-pay | Admitting: Family Medicine

## 2017-05-18 DIAGNOSIS — Z471 Aftercare following joint replacement surgery: Secondary | ICD-10-CM | POA: Diagnosis not present

## 2017-05-18 DIAGNOSIS — M1990 Unspecified osteoarthritis, unspecified site: Secondary | ICD-10-CM | POA: Diagnosis not present

## 2017-05-18 DIAGNOSIS — I480 Paroxysmal atrial fibrillation: Secondary | ICD-10-CM | POA: Diagnosis not present

## 2017-05-18 DIAGNOSIS — I10 Essential (primary) hypertension: Secondary | ICD-10-CM | POA: Diagnosis not present

## 2017-05-18 DIAGNOSIS — E039 Hypothyroidism, unspecified: Secondary | ICD-10-CM | POA: Diagnosis not present

## 2017-05-18 DIAGNOSIS — Z96652 Presence of left artificial knee joint: Secondary | ICD-10-CM | POA: Diagnosis not present

## 2017-05-20 DIAGNOSIS — M47816 Spondylosis without myelopathy or radiculopathy, lumbar region: Secondary | ICD-10-CM | POA: Diagnosis not present
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
## 2017-08-05 ENCOUNTER — Other Ambulatory Visit: Payer: Self-pay

## 2017-08-05 ENCOUNTER — Ambulatory Visit (INDEPENDENT_AMBULATORY_CARE_PROVIDER_SITE_OTHER): Payer: Medicare Other

## 2017-08-05 VITALS — BP 134/68 | HR 58 | Ht 64.0 in | Wt 155.8 lb

## 2017-08-05 DIAGNOSIS — Z0000 Encounter for general adult medical examination without abnormal findings: Secondary | ICD-10-CM | POA: Diagnosis not present
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
## 2017-08-05 NOTE — Patient Instructions (Addendum)
Schedule mammogram after 10/16/2017  Shingrix (shingles vaccine)  Bring a copy of your living will and/or healthcare power of attorney to your next office visit.  Continue doing brain stimulating activities (puzzles, reading, adult coloring books, staying active) to keep memory sharp.    Health Maintenance, Female Adopting a healthy lifestyle and getting preventive care can go a long way to promote health and wellness. Talk with your health care provider about what schedule of regular examinations is right for you. This is a good chance for you to check in with your provider about disease prevention and staying healthy. In between checkups, there are plenty of things you can do on your own. Experts have done a lot of research about which lifestyle changes and preventive measures are most likely to keep you healthy. Ask your health care provider for more information. Weight and diet Eat a healthy diet  Be sure to include plenty of vegetables, fruits, low-fat dairy products, and lean protein.  Do not eat a lot of foods high in solid fats, added sugars, or salt.  Get regular exercise. This is one of the most important things you can do for your health. ? Most adults should exercise for at least 150 minutes each week. The exercise should increase your heart rate and make you sweat (moderate-intensity exercise). ? Most adults should also do strengthening exercises at least twice a week. This is in addition to the moderate-intensity exercise.  Maintain a healthy weight  Body mass index (BMI) is a measurement that can be used to identify possible weight problems. It estimates body fat based on height and weight. Your health care provider can help determine your BMI and help you achieve or maintain a healthy weight.  For females 27 years of age and older: ? A BMI below 18.5 is considered underweight. ? A BMI of 18.5 to 24.9 is normal. ? A BMI of 25 to 29.9 is considered overweight. ? A BMI of  30 and above is considered obese.  Watch levels of cholesterol and blood lipids  You should start having your blood tested for lipids and cholesterol at 72 years of age, then have this test every 5 years.  You may need to have your cholesterol levels checked more often if: ? Your lipid or cholesterol levels are high. ? You are older than 72 years of age. ? You are at high risk for heart disease.  Cancer screening Lung Cancer  Lung cancer screening is recommended for adults 86-37 years old who are at high risk for lung cancer because of a history of smoking.  A yearly low-dose CT scan of the lungs is recommended for people who: ? Currently smoke. ? Have quit within the past 15 years. ? Have at least a 30-pack-year history of smoking. A pack year is smoking an average of one pack of cigarettes a day for 1 year.  Yearly screening should continue until it has been 15 years since you quit.  Yearly screening should stop if you develop a health problem that would prevent you from having lung cancer treatment.  Breast Cancer  Practice breast self-awareness. This means understanding how your breasts normally appear and feel.  It also means doing regular breast self-exams. Let your health care provider know about any changes, no matter how small.  If you are in your 20s or 30s, you should have a clinical breast exam (CBE) by a health care provider every 1-3 years as part of a regular health exam.  If you are 40 or older, have a CBE every year. Also consider having a breast X-ray (mammogram) every year.  If you have a family history of breast cancer, talk to your health care provider about genetic screening.  If you are at high risk for breast cancer, talk to your health care provider about having an MRI and a mammogram every year.  Breast cancer gene (BRCA) assessment is recommended for women who have family members with BRCA-related cancers. BRCA-related cancers  include: ? Breast. ? Ovarian. ? Tubal. ? Peritoneal cancers.  Results of the assessment will determine the need for genetic counseling and BRCA1 and BRCA2 testing.  Cervical Cancer Your health care provider may recommend that you be screened regularly for cancer of the pelvic organs (ovaries, uterus, and vagina). This screening involves a pelvic examination, including checking for microscopic changes to the surface of your cervix (Pap test). You may be encouraged to have this screening done every 3 years, beginning at age 21.  For women ages 30-65, health care providers may recommend pelvic exams and Pap testing every 3 years, or they may recommend the Pap and pelvic exam, combined with testing for human papilloma virus (HPV), every 5 years. Some types of HPV increase your risk of cervical cancer. Testing for HPV may also be done on women of any age with unclear Pap test results.  Other health care providers may not recommend any screening for nonpregnant women who are considered low risk for pelvic cancer and who do not have symptoms. Ask your health care provider if a screening pelvic exam is right for you.  If you have had past treatment for cervical cancer or a condition that could lead to cancer, you need Pap tests and screening for cancer for at least 20 years after your treatment. If Pap tests have been discontinued, your risk factors (such as having a new sexual partner) need to be reassessed to determine if screening should resume. Some women have medical problems that increase the chance of getting cervical cancer. In these cases, your health care provider may recommend more frequent screening and Pap tests.  Colorectal Cancer  This type of cancer can be detected and often prevented.  Routine colorectal cancer screening usually begins at 72 years of age and continues through 72 years of age.  Your health care provider may recommend screening at an earlier age if you have risk factors  for colon cancer.  Your health care provider may also recommend using home test kits to check for hidden blood in the stool.  A small camera at the end of a tube can be used to examine your colon directly (sigmoidoscopy or colonoscopy). This is done to check for the earliest forms of colorectal cancer.  Routine screening usually begins at age 50.  Direct examination of the colon should be repeated every 5-10 years through 72 years of age. However, you may need to be screened more often if early forms of precancerous polyps or small growths are found.  Skin Cancer  Check your skin from head to toe regularly.  Tell your health care provider about any new moles or changes in moles, especially if there is a change in a mole's shape or color.  Also tell your health care provider if you have a mole that is larger than the size of a pencil eraser.  Always use sunscreen. Apply sunscreen liberally and repeatedly throughout the day.  Protect yourself by wearing long sleeves, pants, a wide-brimmed hat, and   sunglasses whenever you are outside.  Heart disease, diabetes, and high blood pressure  High blood pressure causes heart disease and increases the risk of stroke. High blood pressure is more likely to develop in: ? People who have blood pressure in the high end of the normal range (130-139/85-89 mm Hg). ? People who are overweight or obese. ? People who are African American.  If you are 25-70 years of age, have your blood pressure checked every 3-5 years. If you are 86 years of age or older, have your blood pressure checked every year. You should have your blood pressure measured twice-once when you are at a hospital or clinic, and once when you are not at a hospital or clinic. Record the average of the two measurements. To check your blood pressure when you are not at a hospital or clinic, you can use: ? An automated blood pressure machine at a pharmacy. ? A home blood pressure monitor.  If  you are between 34 years and 33 years old, ask your health care provider if you should take aspirin to prevent strokes.  Have regular diabetes screenings. This involves taking a blood sample to check your fasting blood sugar level. ? If you are at a normal weight and have a low risk for diabetes, have this test once every three years after 71 years of age. ? If you are overweight and have a high risk for diabetes, consider being tested at a younger age or more often. Preventing infection Hepatitis B  If you have a higher risk for hepatitis B, you should be screened for this virus. You are considered at high risk for hepatitis B if: ? You were born in a country where hepatitis B is common. Ask your health care provider which countries are considered high risk. ? Your parents were born in a high-risk country, and you have not been immunized against hepatitis B (hepatitis B vaccine). ? You have HIV or AIDS. ? You use needles to inject street drugs. ? You live with someone who has hepatitis B. ? You have had sex with someone who has hepatitis B. ? You get hemodialysis treatment. ? You take certain medicines for conditions, including cancer, organ transplantation, and autoimmune conditions.  Hepatitis C  Blood testing is recommended for: ? Everyone born from [birth year] through 1965. ? Anyone with known risk factors for hepatitis C.  Sexually transmitted infections (STIs)  You should be screened for sexually transmitted infections (STIs) including gonorrhea and chlamydia if: ? You are sexually active and are younger than 72 years of age. ? You are older than 72 years of age and your health care provider tells you that you are at risk for this type of infection. ? Your sexual activity has changed since you were last screened and you are at an increased risk for chlamydia or gonorrhea. Ask your health care provider if you are at risk.  If you do not have HIV, but are at risk, it may be recommended  that you take a prescription medicine daily to prevent HIV infection. This is called pre-exposure prophylaxis (PrEP). You are considered at risk if: ? You are sexually active and do not regularly use condoms or know the HIV status of your partner(s). ? You take drugs by injection. ? You are sexually active with a partner who has HIV.  Talk with your health care provider about whether you are at high risk of being infected with HIV. If you choose to begin PrEP, you  should first be tested for HIV. You should then be tested every 3 months for as long as you are taking PrEP. Pregnancy  If you are premenopausal and you may become pregnant, ask your health care provider about preconception counseling.  If you may become pregnant, take 400 to 800 micrograms (mcg) of folic acid every day.  If you want to prevent pregnancy, talk to your health care provider about birth control (contraception). Osteoporosis and menopause  Osteoporosis is a disease in which the bones lose minerals and strength with aging. This can result in serious bone fractures. Your risk for osteoporosis can be identified using a bone density scan.  If you are 65 years of age or older, or if you are at risk for osteoporosis and fractures, ask your health care provider if you should be screened.  Ask your health care provider whether you should take a calcium or vitamin D supplement to lower your risk for osteoporosis.  Menopause may have certain physical symptoms and risks.  Hormone replacement therapy may reduce some of these symptoms and risks. Talk to your health care provider about whether hormone replacement therapy is right for you. Follow these instructions at home:  Schedule regular health, dental, and eye exams.  Stay current with your immunizations.  Do not use any tobacco products including cigarettes, chewing tobacco, or electronic cigarettes.  If you are pregnant, do not drink alcohol.  If you are  breastfeeding, limit how much and how often you drink alcohol.  Limit alcohol intake to no more than 1 drink per day for nonpregnant women. One drink equals 12 ounces of beer, 5 ounces of wine, or 1 ounces of hard liquor.  Do not use street drugs.  Do not share needles.  Ask your health care provider for help if you need support or information about quitting drugs.  Tell your health care provider if you often feel depressed.  Tell your health care provider if you have ever been abused or do not feel safe at home. This information is not intended to replace advice given to you by your health care provider. Make sure you discuss any questions you have with your health care provider. Document Released: 12/23/2010 Document Revised: 11/15/2015 Document Reviewed: 03/13/2015 Elsevier Interactive Patient Education  2018 [employer]  

## 2017-08-17 DIAGNOSIS — E039 Hypothyroidism, unspecified: Secondary | ICD-10-CM | POA: Diagnosis not present

## 2017-08-17 DIAGNOSIS — I480 Paroxysmal atrial fibrillation: Secondary | ICD-10-CM | POA: Diagnosis not present

## 2017-08-17 DIAGNOSIS — I10 Essential (primary) hypertension: Secondary | ICD-10-CM | POA: Diagnosis not present

## 2017-08-17 DIAGNOSIS — M1990 Unspecified osteoarthritis, unspecified site: Secondary | ICD-10-CM | POA: Diagnosis not present

## 2017-08-18 DIAGNOSIS — M47816 Spondylosis without myelopathy or radiculopathy, lumbar region: Secondary | ICD-10-CM | POA: Diagnosis not present

## 2017-08-19 ENCOUNTER — Other Ambulatory Visit: Payer: Self-pay | Admitting: Neurosurgery

## 2017-08-19 ENCOUNTER — Other Ambulatory Visit: Payer: Self-pay | Admitting: Family Medicine

## 2017-08-19 DIAGNOSIS — M4306 Spondylolysis, lumbar region: Secondary | ICD-10-CM

## 2017-08-27 ENCOUNTER — Ambulatory Visit
Admission: RE | Admit: 2017-08-27 | Discharge: 2017-08-27 | Disposition: A | Discharge status: Home / Self Care | Payer: Medicare Other | Source: Ambulatory Visit | Attending: Neurosurgery | Admitting: Neurosurgery

## 2017-08-27 DIAGNOSIS — M4306 Spondylolysis, lumbar region: Secondary | ICD-10-CM

## 2017-08-27 DIAGNOSIS — M545 Low back pain: Secondary | ICD-10-CM | POA: Diagnosis not present

## 2017-09-08 ENCOUNTER — Encounter: Payer: Self-pay | Admitting: Family Medicine

## 2017-09-08 ENCOUNTER — Other Ambulatory Visit: Payer: Self-pay

## 2017-09-08 ENCOUNTER — Ambulatory Visit (INDEPENDENT_AMBULATORY_CARE_PROVIDER_SITE_OTHER): Payer: Medicare Other | Admitting: Family Medicine

## 2017-09-08 VITALS — BP 120/80 | HR 76 | Temp 98.0°F | Resp 16 | Ht 64.0 in | Wt 157.1 lb

## 2017-09-08 DIAGNOSIS — I4891 Unspecified atrial fibrillation: Secondary | ICD-10-CM | POA: Diagnosis not present

## 2017-09-08 DIAGNOSIS — H6981 Other specified disorders of Eustachian tube, right ear: Secondary | ICD-10-CM

## 2017-09-08 DIAGNOSIS — E785 Hyperlipidemia, unspecified: Secondary | ICD-10-CM | POA: Diagnosis not present

## 2017-09-08 DIAGNOSIS — I10 Essential (primary) hypertension: Secondary | ICD-10-CM

## 2017-09-08 LAB — CBC WITH DIFFERENTIAL/PLATELET
Basophils Absolute: 0.1 10*3/uL (ref 0.0–0.1)
Basophils Relative: 0.7 % (ref 0.0–3.0)
Eosinophils Absolute: 0.3 10*3/uL (ref 0.0–0.7)
Eosinophils Relative: 3.4 % (ref 0.0–5.0)
HCT: 39.7 % (ref 36.0–46.0)
Hemoglobin: 13.4 g/dL (ref 12.0–15.0)
Lymphocytes Relative: 20.7 % (ref 12.0–46.0)
Lymphs Abs: 1.6 10*3/uL (ref 0.7–4.0)
MCHC: 33.6 g/dL (ref 30.0–36.0)
MCV: 89.9 fl (ref 78.0–100.0)
Monocytes Absolute: 0.7 10*3/uL (ref 0.1–1.0)
Monocytes Relative: 9.4 % (ref 3.0–12.0)
Neutro Abs: 5.1 10*3/uL (ref 1.4–7.7)
Neutrophils Relative %: 65.8 % (ref 43.0–77.0)
Platelets: 192 10*3/uL (ref 150.0–400.0)
RBC: 4.42 Mil/uL (ref 3.87–5.11)
RDW: 14 % (ref 11.5–15.5)
WBC: 7.7 10*3/uL (ref 4.0–10.5)

## 2017-09-08 LAB — BASIC METABOLIC PANEL
BUN: 20 mg/dL (ref 6–23)
CO2: 28 mEq/L (ref 19–32)
Calcium: 8.9 mg/dL (ref 8.4–10.5)
Chloride: 104 mEq/L (ref 96–112)
Creatinine, Ser: 0.79 mg/dL (ref 0.40–1.20)
GFR: 76.11 mL/min (ref >60.00)
Glucose, Bld: 85 mg/dL (ref 70–99)
Potassium: 4 mEq/L (ref 3.5–5.1)
Sodium: 139 mEq/L (ref 135–145)

## 2017-09-08 LAB — LIPID PANEL
Cholesterol: 168 mg/dL (ref 0–200)
HDL: 62.9 mg/dL (ref >39.00)
LDL Cholesterol: 91 mg/dL (ref 0–99)
NonHDL: 105.22
Total CHOL/HDL Ratio: 3
Triglycerides: 70 mg/dL (ref 0.0–149.0)
VLDL: 14 mg/dL (ref 0.0–40.0)

## 2017-09-08 LAB — HEPATIC FUNCTION PANEL
ALT: 11 U/L (ref 0–35)
AST: 12 U/L (ref 0–37)
Albumin: 4.1 g/dL (ref 3.5–5.2)
Alkaline Phosphatase: 66 U/L (ref 39–117)
Bilirubin, Direct: 0.1 mg/dL (ref 0.0–0.3)
Total Bilirubin: 0.6 mg/dL (ref 0.2–1.2)
Total Protein: 5.9 g/dL — ABNORMAL LOW (ref 6.0–8.3)

## 2017-09-08 LAB — TSH: TSH: 11.26 u[IU]/mL — ABNORMAL HIGH (ref 0.35–4.50)

## 2017-09-08 NOTE — Patient Instructions (Signed)
Schedule your complete physical in 6 months We'll notify you of your lab results and make any changes if needed Keep up the good work on healthy diet and regular exercise- you look great!! Your ear pain is due to a clogged eustachian tube- this will improve w/ daily Flonase and Zyrtec Call with any questions or concerns Happy Spring!!!

## 2017-09-08 NOTE — Progress Notes (Signed)
   Subjective:    Patient ID: Melissa Jordan, female    DOB: [birth year], 72 y.o.   MRN: [id number]  HPI HTN- chronic problem, on Metoprolol 1/2 tab twice daily, Losartan 25mg  daily w/ good control.  No CP, SOB, HAs, visual changes, edema.  Hyperlipidemia- pt reports she was on medication 'maybe 40 yrs ago' but none recently.  Has been able to control cholesterol w/ diet and exercise  R ear pain- sxs started today.  No drainage from ear.  No fevers.  + nasal congestion.  Using Flonase.   Review of Systems For ROS see HPI     Objective:   Physical Exam  Constitutional: She is oriented to person, place, and time. She appears well-developed and well-nourished. No distress.  HENT:  Head: Normocephalic and atraumatic.  Right Ear: Tympanic membrane is retracted.  Left Ear: Tympanic membrane normal.  Nose: Mucosal edema and rhinorrhea present. Right sinus exhibits no maxillary sinus tenderness and no frontal sinus tenderness. Left sinus exhibits no maxillary sinus tenderness and no frontal sinus tenderness.  Mouth/Throat: Mucous membranes are normal. Posterior oropharyngeal erythema (w/ PND) present.  Eyes: Conjunctivae and EOM are normal. Pupils are equal, round, and reactive to light.  Neck: Normal range of motion. Neck supple.  Cardiovascular: Normal rate, regular rhythm, normal heart sounds and intact distal pulses.  Pulmonary/Chest: Effort normal and breath sounds normal. No respiratory distress. She has no wheezes. She has no rales.  Abdominal: Soft. Bowel sounds are normal. She exhibits no distension. There is no tenderness. There is no rebound and no guarding.  Lymphadenopathy:    She has no cervical adenopathy.  Neurological: She is alert and oriented to person, place, and time.  Skin: Skin is warm and dry.  Vitals reviewed.         Assessment & Plan:  R eustachian tube dysfxn- new.  No evidence of infxn.  Start daily Zyrtec and Flonase.  Reviewed supportive care and red  flags that should prompt return.  Pt expressed understanding and is in agreement w/ plan.

## 2017-09-08 NOTE — Assessment & Plan Note (Signed)
Chronic problem.  Following w/ Dr Sharyn LullHarwani.

## 2017-09-09 ENCOUNTER — Other Ambulatory Visit: Payer: Self-pay | Admitting: General Practice

## 2017-09-09 ENCOUNTER — Other Ambulatory Visit: Payer: Self-pay | Admitting: Family Medicine

## 2017-09-09 ENCOUNTER — Encounter: Payer: Self-pay | Admitting: Family Medicine

## 2017-09-09 DIAGNOSIS — E039 Hypothyroidism, unspecified: Secondary | ICD-10-CM

## 2017-09-09 MED ORDER — LEVOTHYROXINE SODIUM 75 MCG PO TABS: 75.0000 ug | ORAL_TABLET | Freq: Every day | ORAL | 6 refills | Status: DC

## 2017-09-18 DIAGNOSIS — M47816 Spondylosis without myelopathy or radiculopathy, lumbar region: Secondary | ICD-10-CM | POA: Diagnosis not present

## 2017-09-18 DIAGNOSIS — M461 Sacroiliitis, not elsewhere classified: Secondary | ICD-10-CM | POA: Diagnosis not present

## 2017-09-20 NOTE — Assessment & Plan Note (Signed)
Chronic problem.  She was previously on medication but has been able to control w/ diet and exercise.  Check labs and adjust tx plan as needed

## 2017-09-20 NOTE — Assessment & Plan Note (Signed)
Chronic problem.  Good control.  Asymptomatic.  Check labs.  No anticipated med changes.  Will continue to follow.

## 2017-09-23 ENCOUNTER — Other Ambulatory Visit: Payer: Self-pay | Admitting: Nurse Practitioner

## 2017-09-23 DIAGNOSIS — M461 Sacroiliitis, not elsewhere classified: Secondary | ICD-10-CM

## 2017-09-24 ENCOUNTER — Other Ambulatory Visit: Payer: Self-pay | Admitting: Family Medicine

## 2017-10-06 DIAGNOSIS — H5203 Hypermetropia, bilateral: Secondary | ICD-10-CM | POA: Diagnosis not present

## 2017-10-07 ENCOUNTER — Other Ambulatory Visit: Payer: Self-pay | Admitting: General Practice

## 2017-10-07 MED ORDER — METOPROLOL TARTRATE 50 MG PO TABS: 50.0000 mg | ORAL_TABLET | Freq: Two times a day (BID) | ORAL | 0 refills | Status: DC

## 2017-10-07 MED ORDER — ROPINIROLE HCL 0.5 MG PO TABS: 0.5000 mg | ORAL_TABLET | Freq: Every day | ORAL | 0 refills | Status: DC

## 2017-10-07 MED ORDER — LEVOTHYROXINE SODIUM 75 MCG PO TABS: 75.0000 ug | ORAL_TABLET | Freq: Every day | ORAL | 0 refills | Status: DC

## 2017-10-13 ENCOUNTER — Telehealth: Payer: Self-pay | Admitting: Internal Medicine

## 2017-10-13 ENCOUNTER — Ambulatory Visit
Admission: RE | Admit: 2017-10-13 | Discharge: 2017-10-13 | Disposition: A | Discharge status: Home / Self Care | Payer: Medicare Other | Source: Ambulatory Visit | Attending: Nurse Practitioner | Admitting: Nurse Practitioner

## 2017-10-13 ENCOUNTER — Encounter: Payer: Self-pay | Admitting: Physician Assistant

## 2017-10-13 ENCOUNTER — Other Ambulatory Visit: Payer: Self-pay

## 2017-10-13 ENCOUNTER — Ambulatory Visit (INDEPENDENT_AMBULATORY_CARE_PROVIDER_SITE_OTHER): Payer: Medicare Other | Admitting: Physician Assistant

## 2017-10-13 VITALS — BP 128/70 | HR 73 | Temp 98.2°F | Resp 16 | Ht 64.0 in | Wt 153.0 lb

## 2017-10-13 DIAGNOSIS — J208 Acute bronchitis due to other specified organisms: Secondary | ICD-10-CM | POA: Diagnosis not present

## 2017-10-13 DIAGNOSIS — B9689 Other specified bacterial agents as the cause of diseases classified elsewhere: Secondary | ICD-10-CM | POA: Diagnosis not present

## 2017-10-13 DIAGNOSIS — M461 Sacroiliitis, not elsewhere classified: Secondary | ICD-10-CM

## 2017-10-13 DIAGNOSIS — M533 Sacrococcygeal disorders, not elsewhere classified: Secondary | ICD-10-CM | POA: Diagnosis not present

## 2017-10-13 MED ORDER — IOPAMIDOL (ISOVUE-M 200) INJECTION 41%
1.0000 mL | Freq: Once | INTRAMUSCULAR | Status: AC
  Administered 2017-10-13: 1 mL via INTRA_ARTICULAR

## 2017-10-13 MED ORDER — PROMETHAZINE-DM 6.25-15 MG/5ML PO SYRP: 5.0000 mL | ORAL_SOLUTION | Freq: Four times a day (QID) | ORAL | 0 refills | Status: DC | PRN

## 2017-10-13 MED ORDER — DOXYCYCLINE HYCLATE 100 MG PO CAPS: 100.0000 mg | ORAL_CAPSULE | Freq: Two times a day (BID) | ORAL | 0 refills | Status: DC

## 2017-10-13 MED ORDER — METHYLPREDNISOLONE ACETATE 40 MG/ML INJ SUSP (RADIOLOG
120.0000 mg | Freq: Once | INTRAMUSCULAR | Status: AC
  Administered 2017-10-13: 120 mg via INTRA_ARTICULAR

## 2017-10-13 NOTE — Patient Instructions (Signed)
Take antibiotic (Doxycycline) as directed.  Increase fluids.  Get plenty of rest. Use plain Mucinex to thin mucous. Continue Flonase and start a Claritin. Use the Promethazine-DM for the cough. Do not drive while taking this cough medication. Take a daily probiotic (I recommend Align or Culturelle, but even Activia Yogurt may be beneficial).  A humidifier placed in the bedroom may offer some relief for a dry, scratchy throat of nasal irritation.  Read information below on acute bronchitis. Please call or return to clinic if symptoms are not improving.  Acute Bronchitis Bronchitis is when the airways that extend from the windpipe into the lungs get red, puffy, and painful (inflamed). Bronchitis often causes thick spit (mucus) to develop. This leads to a cough. A cough is the most common symptom of bronchitis. In acute bronchitis, the condition usually begins suddenly and goes away over time (usually in 2 weeks). Smoking, allergies, and asthma can make bronchitis worse. Repeated episodes of bronchitis may cause more lung problems.  HOME CARE  Rest.  Drink enough fluids to keep your pee (urine) clear or pale yellow (unless you need to limit fluids as told by your doctor).  Only take over-the-counter or prescription medicines as told by your doctor.  Avoid smoking and secondhand smoke. These can make bronchitis worse. If you are a smoker, think about using nicotine gum or skin patches. Quitting smoking will help your lungs heal faster.  Reduce the chance of getting bronchitis again by:  Washing your hands often.  Avoiding people with cold symptoms.  Trying not to touch your hands to your mouth, nose, or eyes.  Follow up with your doctor as told.  GET HELP IF: Your symptoms do not improve after 1 week of treatment. Symptoms include:  Cough.  Fever.  Coughing up thick spit.  Body aches.  Chest congestion.  Chills.  Shortness of breath.  Sore throat.  GET HELP RIGHT AWAY IF:    You have an increased fever.  You have chills.  You have severe shortness of breath.  You have bloody thick spit (sputum).  You throw up (vomit) often.  You lose too much body fluid (dehydration).  You have a severe headache.  You faint.  MAKE SURE YOU:   Understand these instructions.  Will watch your condition.  Will get help right away if you are not doing well or get worse. Document Released: 11/26/2007 Document Revised: 02/09/2013 Document Reviewed: 11/30/2012 [facility] Patient Information 2015 [place], [place]. This information is not intended to replace advice given to you by your health care provider. Make sure you discuss any questions you have with your health care provider.

## 2017-10-13 NOTE — Progress Notes (Signed)
Patient presents to clinic today c/o sinus pressure, pnd and nasal congestion that has been present over the past week. Notes this weekend, she started developing increased symptoms and chest congestion with a cough that has become productive of thick green sputum. Has noted chills. Denies objective fever. Notes headache without sinus pain or ear pain. Has noted chest tenderness 2/2 coughing. Denies recent travel.   Past Medical History:  Diagnosis Date  . Allergy   . Anemia    after last surgery in 2016  . Arrhythmia    takes Metoprolol daily  . Asthma   . Bruises easily   . Chronic lower back pain   . Complication of anesthesia    "BP bottoms out after OR" (06/28/2012)  . GERD (gastroesophageal reflux disease)    "one time; really I think it was all due to drinking aspartame" (06/28/2012)  . History of bronchitis    "when I get a bad cold; not chronic; I've had it a few times" (06/28/2012)  . History of stress test    30 yrs. ago- wnl  . Joint pain   . Joint swelling   . Neuromuscular disorder (HCC)    back related   . Osteoarthritis    back, knees  . Osteopenia   . Pneumonia    "couple times in the winters" (06/28/2012), hosp. 2002  . PONV (postoperative nausea and vomiting)    SUPER NAUSEATED  . Seasonal allergies    takes Claritin daily  . Urinary urgency     Current Outpatient Medications on File Prior to Visit  Medication Sig Dispense Refill  . acetaminophen (TYLENOL) 500 MG tablet Take 1,000 mg by mouth 2 (two) times daily as needed. For pain    . amiodarone (PACERONE) 200 MG tablet Take 1 tablet (200 mg total) by mouth daily. (Patient taking differently: Take 200 mg by mouth daily. ) 30 tablet 0  . apixaban (ELIQUIS) 5 MG TABS tablet Take 1 tablet (5 mg total) by mouth 2 (two) times daily. 60 tablet 0  . aspirin EC 81 MG tablet Take 1 tablet (81 mg total) by mouth daily. 30 tablet 3  . Biotin 5000 MCG CAPS Take 5,000 mcg by mouth daily.    Marland Kitchen CALCIUM-VITAMIN D PO  Take 1 tablet by mouth daily.     . cholecalciferol (VITAMIN D) 1000 units tablet Take 1,000 Units by mouth daily.    . DiphenhydrAMINE HCl (BENADRYL PO) Take by mouth.    . estradiol (ESTRACE) 1 MG tablet TAKE 1 TABLET BY MOUTH ONCE DAILY 90 tablet 0  . fluticasone (FLONASE) 50 MCG/ACT nasal spray Place 2 sprays into both nostrils daily as needed for allergies or rhinitis.     Marland Kitchen levothyroxine (SYNTHROID, LEVOTHROID) 75 MCG tablet Take 1 tablet (75 mcg total) by mouth daily. 90 tablet 0  . losartan (COZAAR) 25 MG tablet     . metoprolol tartrate (LOPRESSOR) 50 MG tablet Take 1 tablet (50 mg total) by mouth 2 (two) times daily. 180 tablet 0  . rOPINIRole (REQUIP) 0.5 MG tablet Take 1 tablet (0.5 mg total) by mouth at bedtime. 90 tablet 0  . traMADol (ULTRAM) 50 MG tablet Take 50 mg by mouth every 6 (six) hours as needed for moderate pain.     Marland Kitchen triamcinolone cream (KENALOG) 0.1 % Apply 1 application topically 4 (four) times daily. 453.6 g 0   No current facility-administered medications on file prior to visit.     Allergies  Allergen  Reactions  . Lyrica [Pregabalin] Palpitations and Other (See Comments)    "thought I was going to have a heart attack; heart started pounding so hard, I couldn't catch my breath" (06/28/2012)  . Meclizine Other (See Comments)    "what was in my mind to say wasn't what came out to say; I was kind of in another world" (06/28/2012)  . Penicillins Itching and Rash    Has patient had a PCN reaction causing immediate rash, facial/tongue/throat swelling, SOB or lightheadedness with hypotension: No Has patient had a PCN reaction causing severe rash involving mucus membranes or skin necrosis: No Has patient had a PCN reaction that required hospitalization No Has patient had a PCN reaction occurring within the last 10 years:   # # YES # #  If all of the above answers are "NO", then may proceed with Cephalosporin use.   . Poison Sumac Extract Swelling, Rash and Other (See  Comments)    Blisters  . Bactrim [Sulfamethoxazole-Trimethoprim] Rash  . Codeine Nausea Only  . Morphine Sulfate Rash  . Propoxyphene N-Acetaminophen Nausea And Vomiting    "Darvocet"    Family History  Problem Relation Age of Onset  . COPD Mother   . Heart disease Father   . Stroke Paternal Grandmother   . Cancer Paternal Grandfather   . Alcohol abuse Unknown        fhx  . Diabetes Unknown        fhx  . Hypertension Unknown        fhx  . Stroke Unknown        fhx  . Heart disease Unknown        fhx  . Asthma Unknown        fhx  . Colon cancer Neg Hx     Social History   Socioeconomic History  . Marital status: Married    Spouse name: Not on file  . Number of children: Not on file  . Years of education: Not on file  . Highest education level: Not on file  Occupational History  . Not on file  Social Needs  . Financial resource strain: Not on file  . Food insecurity:    Worry: Not on file    Inability: Not on file  . Transportation needs:    Medical: Not on file    Non-medical: Not on file  Tobacco Use  . Smoking status: Never Smoker  . Smokeless tobacco: Never Used  Substance and Sexual Activity  . Alcohol use: No  . Drug use: No  . Sexual activity: Not Currently    Birth control/protection: Surgical  Lifestyle  . Physical activity:    Days per week: Not on file    Minutes per session: Not on file  . Stress: Not on file  Relationships  . Social connections:    Talks on phone: Not on file    Gets together: Not on file    Attends religious service: Not on file    Active member of club or organization: Not on file    Attends meetings of clubs or organizations: Not on file    Relationship status: Not on file  Other Topics Concern  . Not on file  Social History Narrative  . Not on file   Review of Systems - See HPI.  All other ROS are negative.  BP 128/70   Pulse 73   Temp 98.2 F (36.8 C) (Oral)   Resp 16   Ht 5\' 4"  (  1.626 m)   Wt 153 lb  (69.4 kg)   SpO2 99%   BMI 26.26 kg/m   Physical Exam  Constitutional: She appears well-developed and well-nourished.  HENT:  Head: Normocephalic and atraumatic.  Eyes: Conjunctivae are normal.  Neck: Neck supple.  Cardiovascular: Normal heart sounds.  Pulmonary/Chest: Effort normal and breath sounds normal. No stridor. No respiratory distress. She has no wheezes. She has no rales. She exhibits no tenderness.  Skin: Skin is warm.  Vitals reviewed.  Recent Results (from the past 2160 hour(s))  Lipid panel     Status: None   Collection Time: 09/08/17  9:33 AM  Result Value Ref Range   Cholesterol 168 0 - 200 mg/dL    Comment: ATP III Classification       Desirable:  < 200 mg/dL               Borderline High:  200 - 239 mg/dL          High:  > = 161 mg/dL   Triglycerides 09.6 0.0 - 149.0 mg/dL    Comment: Normal:  <045 mg/dLBorderline High:  150 - 199 mg/dL   HDL 40.98 >11.91 mg/dL   VLDL 47.8 0.0 - 29.5 mg/dL   LDL Cholesterol 91 0 - 99 mg/dL   Total CHOL/HDL Ratio 3     Comment:                Men          Women1/2 Average Risk     3.4          3.3Average Risk          5.0          4.42X Average Risk          9.6          7.13X Average Risk          15.0          11.0                       NonHDL 105.22     Comment: NOTE:  Non-HDL goal should be 30 mg/dL higher than patient's LDL goal (i.e. LDL goal of < 70 mg/dL, would have non-HDL goal of < 100 mg/dL)  Basic metabolic panel     Status: None   Collection Time: 09/08/17  9:33 AM  Result Value Ref Range   Sodium 139 135 - 145 mEq/L   Potassium 4.0 3.5 - 5.1 mEq/L   Chloride 104 96 - 112 mEq/L   CO2 28 19 - 32 mEq/L   Glucose, Bld 85 70 - 99 mg/dL   BUN 20 6 - 23 mg/dL   Creatinine, Ser 6.21 0.40 - 1.20 mg/dL   Calcium 8.9 8.4 - 30.8 mg/dL   GFR 65.78 >46.96 mL/min  Hepatic function panel     Status: Abnormal   Collection Time: 09/08/17  9:33 AM  Result Value Ref Range   Total Bilirubin 0.6 0.2 - 1.2 mg/dL   Bilirubin,  Direct 0.1 0.0 - 0.3 mg/dL   Alkaline Phosphatase 66 39 - 117 U/L   AST 12 0 - 37 U/L   ALT 11 0 - 35 U/L   Total Protein 5.9 (L) 6.0 - 8.3 g/dL   Albumin 4.1 3.5 - 5.2 g/dL  TSH     Status: Abnormal   Collection Time: 09/08/17  9:33 AM  Result Value Ref Range  TSH 11.26 (H) 0.35 - 4.50 uIU/mL  CBC with Differential/Platelet     Status: None   Collection Time: 09/08/17  9:33 AM  Result Value Ref Range   WBC 7.7 4.0 - 10.5 K/uL   RBC 4.42 3.87 - 5.11 Mil/uL   Hemoglobin 13.4 12.0 - 15.0 g/dL   HCT 16.1 09.6 - 04.5 %   MCV 89.9 78.0 - 100.0 fl   MCHC 33.6 30.0 - 36.0 g/dL   RDW 40.9 81.1 - 91.4 %   Platelets 192.0 150.0 - 400.0 K/uL   Neutrophils Relative % 65.8 43.0 - 77.0 %   Lymphocytes Relative 20.7 12.0 - 46.0 %   Monocytes Relative 9.4 3.0 - 12.0 %   Eosinophils Relative 3.4 0.0 - 5.0 %   Basophils Relative 0.7 0.0 - 3.0 %   Neutro Abs 5.1 1.4 - 7.7 K/uL   Lymphs Abs 1.6 0.7 - 4.0 K/uL   Monocytes Absolute 0.7 0.1 - 1.0 K/uL   Eosinophils Absolute 0.3 0.0 - 0.7 K/uL   Basophils Absolute 0.1 0.0 - 0.1 K/uL    Assessment/Plan: 1. Acute bacterial bronchitis Rx doxycycline.  Increase fluids.  Rest.  Saline nasal spray.  Probiotic.  Mucinex as directed.  Humidifier in bedroom. Rx promethazine-DM for cough as she has tolerated well previously.  Call or return to clinic if symptoms are not improving.    Piedad Climes, PA-C

## 2017-10-13 NOTE — Discharge Instructions (Signed)

## 2017-10-13 NOTE — Telephone Encounter (Signed)
pt called answering service states the cough syrup w/ codeine was not sent to her pharmacy, chart reviewed, a Rx for phenergan DM was sent today. To be sure will re-send it, if she feels she needs codeine, needs to contact the office in AM

## 2017-10-14 ENCOUNTER — Other Ambulatory Visit: Payer: Self-pay | Admitting: Physician Assistant

## 2017-10-14 ENCOUNTER — Other Ambulatory Visit: Payer: Self-pay | Admitting: Emergency Medicine

## 2017-10-14 MED ORDER — GUAIFENESIN-CODEINE 100-10 MG/5ML PO SOLN: 5.0000 mL | Freq: Three times a day (TID) | ORAL | 0 refills | Status: DC | PRN

## 2017-10-14 MED ORDER — ESTRADIOL 1 MG PO TABS: 1.0000 mg | ORAL_TABLET | Freq: Every day | ORAL | 0 refills | Status: DC

## 2017-10-21 DIAGNOSIS — M47816 Spondylosis without myelopathy or radiculopathy, lumbar region: Secondary | ICD-10-CM | POA: Diagnosis not present

## 2017-10-21 DIAGNOSIS — M461 Sacroiliitis, not elsewhere classified: Secondary | ICD-10-CM | POA: Diagnosis not present

## 2017-10-22 ENCOUNTER — Other Ambulatory Visit: Payer: Self-pay | Admitting: Family Medicine

## 2017-11-03 ENCOUNTER — Other Ambulatory Visit: Payer: Self-pay

## 2017-11-03 ENCOUNTER — Encounter: Payer: Self-pay | Admitting: Family Medicine

## 2017-11-03 ENCOUNTER — Ambulatory Visit (INDEPENDENT_AMBULATORY_CARE_PROVIDER_SITE_OTHER): Payer: Medicare Other | Admitting: Family Medicine

## 2017-11-03 VITALS — BP 121/81 | HR 59 | Temp 98.6°F | Resp 18 | Ht 64.0 in | Wt 154.4 lb

## 2017-11-03 DIAGNOSIS — R05 Cough: Secondary | ICD-10-CM

## 2017-11-03 DIAGNOSIS — R059 Cough, unspecified: Secondary | ICD-10-CM

## 2017-11-03 MED ORDER — MOMETASONE FUROATE 100 MCG/ACT IN AERO: 1.0000 | INHALATION_SPRAY | Freq: Two times a day (BID) | RESPIRATORY_TRACT | 0 refills | Status: DC

## 2017-11-03 MED ORDER — ALBUTEROL SULFATE HFA 108 (90 BASE) MCG/ACT IN AERS
2.0000 | INHALATION_SPRAY | Freq: Four times a day (QID) | RESPIRATORY_TRACT | 2 refills | Status: DC | PRN
Start: 2017-11-03 — End: 2018-03-31

## 2017-11-03 MED ORDER — GUAIFENESIN-CODEINE 100-10 MG/5ML PO SOLN: 5.0000 mL | Freq: Three times a day (TID) | ORAL | 0 refills | Status: DC | PRN

## 2017-11-03 MED ORDER — PREDNISONE 10 MG PO TABS: ORAL_TABLET | ORAL | 0 refills | Status: DC

## 2017-11-03 MED ORDER — ALBUTEROL SULFATE (2.5 MG/3ML) 0.083% IN NEBU
2.5000 mg | INHALATION_SOLUTION | Freq: Once | RESPIRATORY_TRACT | Status: AC
  Administered 2017-11-03: 2.5 mg via RESPIRATORY_TRACT

## 2017-11-03 NOTE — Patient Instructions (Signed)
Follow up as needed or as scheduled START the Prednisone as directed- 3 tabs at the same time x3 days, then 2 tabs at the same time x3 days, and then 1 tab daily.  Take w/ food. USE the Asmanex inhaler- 1 puff twice daily- until feeling better (free coupon) USE the Albuterol inhaler- 2 puffs every 4-6 hrs as needed for chest tightness, shortness of breath, or cough Drink plenty of fluids REST! Call with any questions or concerns Hang in there!!!

## 2017-11-03 NOTE — Progress Notes (Signed)
   Subjective:    Patient ID: Melissa Jordan, female    DOB: 08-Aug-1945, 73 y.o.   MRN: [id number]  HPI 'same junk I had last time I was here'- pt was seen on 4/23 and dx'd w/ bronchitis.  tx'd w/ Doxy.  Pt reports sxs improved but returned 3 days ago.  No fever.  Cough is mostly dry but will produce green sputum periodically.  + SOB.  No wheezing.  No sinus pain/pressure.  No ear pain.  Using Flonase, Claritin, Mucinex, and cough syrup.   Review of Systems For ROS see HPI     Objective:   Physical Exam  Constitutional: She appears well-developed and well-nourished. No distress.  HENT:  Head: Normocephalic and atraumatic.  TMs normal bilaterally Mild nasal congestion Throat w/out erythema, edema, or exudate  Eyes: Pupils are equal, round, and reactive to light. Conjunctivae and EOM are normal.  Neck: Normal range of motion. Neck supple.  Cardiovascular: Normal rate, regular rhythm, normal heart sounds and intact distal pulses.  No murmur heard. Pulmonary/Chest: Effort normal and breath sounds normal. No respiratory distress. She has no wheezes.  Near continuous dry, hacking cough- improved s/p neb tx in office  Lymphadenopathy:    She has no cervical adenopathy.  Vitals reviewed.         Assessment & Plan:  Cough- new to provider, recurrent for pt.  Her initial bronchitis improved w/ course of Doxy.  sxs returned 3 days ago.  No fevers, body aches.  Suspect viral illness vs post-infectious cough.  Cough lessened in office s/p neb tx.  Start Asmanex until sxs improve, albuterol prn, and Prednisone taper.  If no improvement will need CXR.  Reviewed supportive care and red flags that should prompt return.  Pt expressed understanding and is in agreement w/ plan.

## 2017-11-04 ENCOUNTER — Telehealth: Payer: Self-pay | Admitting: General Practice

## 2017-11-04 DIAGNOSIS — M545 Low back pain: Secondary | ICD-10-CM | POA: Diagnosis not present

## 2017-11-04 DIAGNOSIS — M5136 Other intervertebral disc degeneration, lumbar region: Secondary | ICD-10-CM | POA: Diagnosis not present

## 2017-11-04 DIAGNOSIS — M461 Sacroiliitis, not elsewhere classified: Secondary | ICD-10-CM | POA: Diagnosis not present

## 2017-11-04 DIAGNOSIS — G894 Chronic pain syndrome: Secondary | ICD-10-CM | POA: Diagnosis not present

## 2017-11-04 DIAGNOSIS — M47816 Spondylosis without myelopathy or radiculopathy, lumbar region: Secondary | ICD-10-CM | POA: Diagnosis not present

## 2017-11-04 NOTE — Telephone Encounter (Signed)
Called and spoke with pt. She advised that she was told that the asmanex will be available today.

## 2017-11-04 NOTE — Telephone Encounter (Signed)
Received a PA request from the pharmacy stating that Asmanex is not covered. Pt insurance prefers arnuity or flovent.   Please advise?

## 2017-11-04 NOTE — Telephone Encounter (Signed)
The reason Asmanex was written is b/c Melissa Jordan has a free coupon for it.  That's the only free coupon we have and it shouldn't matter if insurance prefers or not

## 2017-11-09 DIAGNOSIS — E039 Hypothyroidism, unspecified: Secondary | ICD-10-CM | POA: Diagnosis not present

## 2017-11-09 DIAGNOSIS — I10 Essential (primary) hypertension: Secondary | ICD-10-CM | POA: Diagnosis not present

## 2017-11-09 DIAGNOSIS — M1990 Unspecified osteoarthritis, unspecified site: Secondary | ICD-10-CM | POA: Diagnosis not present

## 2017-11-09 DIAGNOSIS — I480 Paroxysmal atrial fibrillation: Secondary | ICD-10-CM | POA: Diagnosis not present

## 2017-11-19 DIAGNOSIS — M47816 Spondylosis without myelopathy or radiculopathy, lumbar region: Secondary | ICD-10-CM | POA: Diagnosis not present

## 2017-11-19 DIAGNOSIS — M461 Sacroiliitis, not elsewhere classified: Secondary | ICD-10-CM | POA: Diagnosis not present

## 2017-11-25 ENCOUNTER — Other Ambulatory Visit: Payer: Self-pay | Admitting: Family Medicine

## 2017-12-01 DIAGNOSIS — M47816 Spondylosis without myelopathy or radiculopathy, lumbar region: Secondary | ICD-10-CM | POA: Diagnosis not present

## 2017-12-01 DIAGNOSIS — M5136 Other intervertebral disc degeneration, lumbar region: Secondary | ICD-10-CM | POA: Diagnosis not present

## 2017-12-01 DIAGNOSIS — G894 Chronic pain syndrome: Secondary | ICD-10-CM | POA: Diagnosis not present

## 2017-12-09 ENCOUNTER — Other Ambulatory Visit: Payer: Self-pay | Admitting: Family Medicine

## 2017-12-17 DIAGNOSIS — M5136 Other intervertebral disc degeneration, lumbar region: Secondary | ICD-10-CM | POA: Diagnosis not present

## 2017-12-17 DIAGNOSIS — M47816 Spondylosis without myelopathy or radiculopathy, lumbar region: Secondary | ICD-10-CM | POA: Diagnosis not present

## 2017-12-23 ENCOUNTER — Encounter (HOSPITAL_COMMUNITY): Payer: Self-pay | Admitting: Emergency Medicine

## 2017-12-23 ENCOUNTER — Ambulatory Visit (HOSPITAL_COMMUNITY)
Admission: EM | Admit: 2017-12-23 | Discharge: 2017-12-23 | Disposition: A | Discharge status: Home / Self Care | Payer: Medicare Other | Attending: Internal Medicine | Admitting: Internal Medicine

## 2017-12-23 ENCOUNTER — Ambulatory Visit (INDEPENDENT_AMBULATORY_CARE_PROVIDER_SITE_OTHER): Payer: Medicare Other

## 2017-12-23 ENCOUNTER — Other Ambulatory Visit: Payer: Self-pay

## 2017-12-23 DIAGNOSIS — J189 Pneumonia, unspecified organism: Secondary | ICD-10-CM

## 2017-12-23 DIAGNOSIS — R05 Cough: Secondary | ICD-10-CM | POA: Diagnosis not present

## 2017-12-23 DIAGNOSIS — M5489 Other dorsalgia: Secondary | ICD-10-CM

## 2017-12-23 MED ORDER — DOXYCYCLINE HYCLATE 100 MG PO CAPS: 100.0000 mg | ORAL_CAPSULE | Freq: Two times a day (BID) | ORAL | 0 refills | Status: DC

## 2017-12-23 MED ORDER — IPRATROPIUM-ALBUTEROL 0.5-2.5 (3) MG/3ML IN SOLN
3.0000 mL | Freq: Once | RESPIRATORY_TRACT | Status: AC
  Administered 2017-12-23: 3 mL via RESPIRATORY_TRACT

## 2017-12-23 MED ORDER — AMIODARONE HCL 200 MG PO TABS: 100.0000 mg | ORAL_TABLET | Freq: Every day | ORAL | 3 refills | Status: DC

## 2017-12-23 MED ORDER — PREDNISONE 10 MG (21) PO TBPK: ORAL_TABLET | Freq: Every day | ORAL | 0 refills | Status: DC

## 2017-12-23 MED ORDER — IPRATROPIUM-ALBUTEROL 0.5-2.5 (3) MG/3ML IN SOLN
RESPIRATORY_TRACT | Status: AC
  Filled 2017-12-23: qty 3

## 2017-12-23 NOTE — ED Notes (Signed)
Patient says she will not go if told to go to ed.  Patient aware we may not be able to diagnose her in this setting.
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
## 2017-12-23 NOTE — ED Triage Notes (Signed)
Right side and back pain with movement, breathing, with coughing.  Patient says she has had pleurisy before and thinks this is very similar.  Patient has had a cough for a week.

## 2018-01-11 ENCOUNTER — Ambulatory Visit (INDEPENDENT_AMBULATORY_CARE_PROVIDER_SITE_OTHER): Payer: Medicare Other | Admitting: Physician Assistant

## 2018-01-11 ENCOUNTER — Other Ambulatory Visit: Payer: Self-pay

## 2018-01-11 ENCOUNTER — Ambulatory Visit (HOSPITAL_BASED_OUTPATIENT_CLINIC_OR_DEPARTMENT_OTHER)
Admission: RE | Admit: 2018-01-11 | Discharge: 2018-01-11 | Disposition: A | Discharge status: Home / Self Care | Payer: Medicare Other | Source: Ambulatory Visit | Attending: Physician Assistant | Admitting: Physician Assistant

## 2018-01-11 ENCOUNTER — Encounter: Payer: Self-pay | Admitting: Physician Assistant

## 2018-01-11 VITALS — BP 112/70 | HR 73 | Temp 98.7°F | Resp 16 | Ht 64.0 in | Wt 154.0 lb

## 2018-01-11 DIAGNOSIS — R918 Other nonspecific abnormal finding of lung field: Secondary | ICD-10-CM | POA: Insufficient documentation

## 2018-01-11 DIAGNOSIS — J189 Pneumonia, unspecified organism: Secondary | ICD-10-CM | POA: Insufficient documentation

## 2018-01-11 MED ORDER — PROMETHAZINE-DM 6.25-15 MG/5ML PO SYRP: 5.0000 mL | ORAL_SOLUTION | Freq: Four times a day (QID) | ORAL | 0 refills | Status: DC | PRN

## 2018-01-11 NOTE — Progress Notes (Signed)
Patient presents to clinic today with concerns of recurrence of pneumonia. [facility] with c/o productive cough, chest congestion and fatigue. CXR obtained at that time revealed a lingular pneumonia. Patient was started on 10-days of Doxycycline and 12 days of prednisone taper. Notes taking medications as directed and tolerating well. Notes significant improvement in symptoms. Since completing medicines, has none a recurrence of symptoms over the past week. Denies fever, chills. Notes cough that is sometimes productive of green sputum. Denies sinus pressure, nasal congestion, ear pain. Denies recent travel or sick contact. Is not currently taking anything for symptoms..   Past Medical History:  Diagnosis Date  . Allergy   . Anemia    after last surgery in 2016  . Arrhythmia    takes Metoprolol daily  . Asthma   . Bruises easily   . Chronic lower back pain   . Complication of anesthesia    "BP bottoms out after OR" (06/28/2012)  . GERD (gastroesophageal reflux disease)    "one time; really I think it was all due to drinking aspartame" (06/28/2012)  . History of bronchitis    "when I get a bad cold; not chronic; I've had it a few times" (06/28/2012)  . History of stress test    30 yrs. ago- wnl  . Joint pain   . Joint swelling   . Neuromuscular disorder (HCC)    back related   . Osteoarthritis    back, knees  . Osteopenia   . Pneumonia    "couple times in the winters" (06/28/2012), hosp. 2002  . PONV (postoperative nausea and vomiting)    SUPER NAUSEATED  . Seasonal allergies    takes Claritin daily  . Urinary urgency     Current Outpatient Medications on File Prior to Visit  Medication Sig Dispense Refill  . acetaminophen (TYLENOL) 500 MG tablet Take 1,000 mg by mouth 2 (two) times daily as needed. For pain    . albuterol (PROVENTIL HFA;VENTOLIN HFA) 108 (90 Base) MCG/ACT inhaler Inhale 2 puffs into the lungs every 6 (six) hours as needed for wheezing or  shortness of breath. 1 Inhaler 2  . amiodarone (PACERONE) 200 MG tablet Take 0.5 tablets (100 mg total) by mouth daily. 30 tablet 3  . apixaban (ELIQUIS) 5 MG TABS tablet Take 1 tablet (5 mg total) by mouth 2 (two) times daily. 60 tablet 0  . aspirin EC 81 MG tablet Take 1 tablet (81 mg total) by mouth daily. 30 tablet 3  . Biotin 5000 MCG CAPS Take 5,000 mcg by mouth daily.    Marland Kitchen. CALCIUM-VITAMIN D PO Take 1 tablet by mouth daily.     . cholecalciferol (VITAMIN D) 1000 units tablet Take 1,000 Units by mouth daily.    Marland Kitchen. estradiol (ESTRACE) 1 MG tablet TAKE 1 TABLET BY MOUTH  DAILY 90 tablet 0  . fluticasone (FLONASE) 50 MCG/ACT nasal spray Place 2 sprays into both nostrils daily as needed for allergies or rhinitis.     Marland Kitchen. levothyroxine (SYNTHROID, LEVOTHROID) 75 MCG tablet TAKE 1 TABLET BY MOUTH  DAILY 90 tablet 0  . losartan (COZAAR) 25 MG tablet     . metoprolol tartrate (LOPRESSOR) 50 MG tablet Take 1 tablet (50 mg total) by mouth 2 (two) times daily. 180 tablet 0  . Mometasone Furoate (ASMANEX HFA) 100 MCG/ACT AERO Inhale 1 puff into the lungs 2 (two) times daily. 1 Inhaler 0  . rOPINIRole (REQUIP) 0.5 MG tablet TAKE 1  TABLET BY MOUTH AT  BEDTIME 90 tablet 0  . traMADol (ULTRAM) 50 MG tablet Take 50 mg by mouth every 6 (six) hours as needed for moderate pain.     Marland Kitchen triamcinolone cream (KENALOG) 0.1 % Apply 1 application topically 4 (four) times daily. 453.6 g 0   No current facility-administered medications on file prior to visit.     Allergies  Allergen Reactions  . Lyrica [Pregabalin] Palpitations and Other (See Comments)    "thought I was going to have a heart attack; heart started pounding so hard, I couldn't catch my breath" (06/28/2012)  . Meclizine Other (See Comments)    "what was in my mind to say wasn't what came out to say; I was kind of in another world" (06/28/2012)  . Penicillins Itching and Rash    Has patient had a PCN reaction causing immediate rash, facial/tongue/throat  swelling, SOB or lightheadedness with hypotension: No Has patient had a PCN reaction causing severe rash involving mucus membranes or skin necrosis: No Has patient had a PCN reaction that required hospitalization No Has patient had a PCN reaction occurring within the last 10 years:   # # YES # #  If all of the above answers are "NO", then may proceed with Cephalosporin use.   . Poison Sumac Extract Swelling, Rash and Other (See Comments)    Blisters  . Bactrim [Sulfamethoxazole-Trimethoprim] Rash  . Codeine Nausea Only  . Morphine Sulfate Rash  . Propoxyphene N-Acetaminophen Nausea And Vomiting    "Darvocet"    Family History  Problem Relation Age of Onset  . COPD Mother   . Heart disease Father   . Stroke Paternal Grandmother   . Cancer Paternal Grandfather   . Alcohol abuse Unknown        fhx  . Diabetes Unknown        fhx  . Hypertension Unknown        fhx  . Stroke Unknown        fhx  . Heart disease Unknown        fhx  . Asthma Unknown        fhx  . Colon cancer Neg Hx     Social History   Socioeconomic History  . Marital status: Married    Spouse name: Not on file  . Number of children: Not on file  . Years of education: Not on file  . Highest education level: Not on file  Occupational History  . Not on file  Social Needs  . Financial resource strain: Not on file  . Food insecurity:    Worry: Not on file    Inability: Not on file  . Transportation needs:    Medical: Not on file    Non-medical: Not on file  Tobacco Use  . Smoking status: Never Smoker  . Smokeless tobacco: Never Used  Substance and Sexual Activity  . Alcohol use: No  . Drug use: No  . Sexual activity: Not Currently    Birth control/protection: Surgical  Lifestyle  . Physical activity:    Days per week: Not on file    Minutes per session: Not on file  . Stress: Not on file  Relationships  . Social connections:    Talks on phone: Not on file    Gets together: Not on file     Attends religious service: Not on file    Active member of club or organization: Not on file    Attends meetings of  clubs or organizations: Not on file    Relationship status: Not on file  Other Topics Concern  . Not on file  Social History Narrative  . Not on file   Review of Systems - See HPI.  All other ROS are negative.  BP 112/70   Pulse 73   Temp 98.7 F (37.1 C) (Oral)   Resp 16   Ht 5\' 4"  (1.626 m)   Wt 154 lb (69.9 kg)   SpO2 98%   BMI 26.43 kg/m   Physical Exam  Constitutional: She appears well-developed and well-nourished.  HENT:  Head: Normocephalic and atraumatic.  Right Ear: External ear normal.  Left Ear: External ear normal.  Nose: Nose normal.  Mouth/Throat: Oropharynx is clear and moist. No oropharyngeal exudate.  TM within normal limits bilaterally  Eyes: Conjunctivae are normal.  Cardiovascular: Normal rate, regular rhythm and normal heart sounds.  Pulmonary/Chest: Effort normal. No stridor. No respiratory distress. She has no wheezes. She has rales (Mid and lower left lung.). She exhibits no tenderness.  Vitals reviewed.  Assessment/Plan: 1. Community acquired pneumonia, unspecified laterality Recently treated with Doxycycline. Question recurrence versus new onset bronchitis with bronchospasm. Vitals are stable. No distress. OTC medications reviewed. Start promethazine-DM for cough. Restart MDI. Will repeat CXR today to assess. Will restart antibiotics based on imaging findings.  - DG Chest 2 View; Future    Piedad Climes, PA-C

## 2018-01-11 NOTE — Patient Instructions (Signed)
Please go to the [facility] for [facility]. I will call with results.  Please keep well hydrated.  Start plain Mucinex. Take the cough medication as directed and continue the inhalers as directed.  We will alter regimen further based on x-ray results.

## 2018-01-12 ENCOUNTER — Telehealth: Payer: Self-pay | Admitting: Physician Assistant

## 2018-01-12 ENCOUNTER — Other Ambulatory Visit: Payer: Self-pay | Admitting: Physician Assistant

## 2018-01-12 DIAGNOSIS — J189 Pneumonia, unspecified organism: Secondary | ICD-10-CM

## 2018-01-12 MED ORDER — DOXYCYCLINE HYCLATE 100 MG PO TABS: 100.0000 mg | ORAL_TABLET | Freq: Two times a day (BID) | ORAL | 0 refills | Status: DC

## 2018-01-12 NOTE — Telephone Encounter (Signed)
Copied from CRM [id number]. Topic: General - Other >> Jan 12, 2018 12:40 PM Stephannie LiSimmons, Katrinka Herbison L, NT wrote: Reason for CRM: The patient called and would like to know the results of the  x rays that were done on yesterday ,she would like to know if she will need to begin antibiotics please advise  [phone]

## 2018-01-12 NOTE — Telephone Encounter (Signed)
Advised patient of xray results. No worsening of xray results. Reminded to make sure she is taking Mucinex to help with the mucous. Repeat abx. Doxycycline sent to the pharmacy. She is agreeable.

## 2018-01-13 ENCOUNTER — Other Ambulatory Visit: Payer: Self-pay | Admitting: Emergency Medicine

## 2018-01-13 DIAGNOSIS — J189 Pneumonia, unspecified organism: Secondary | ICD-10-CM

## 2018-01-13 MED ORDER — DOXYCYCLINE HYCLATE 100 MG PO TABS: 100.0000 mg | ORAL_TABLET | Freq: Two times a day (BID) | ORAL | 0 refills | Status: AC

## 2018-01-27 DIAGNOSIS — M47816 Spondylosis without myelopathy or radiculopathy, lumbar region: Secondary | ICD-10-CM | POA: Diagnosis not present

## 2018-02-01 ENCOUNTER — Other Ambulatory Visit: Payer: Self-pay | Admitting: Family Medicine

## 2018-02-09 ENCOUNTER — Other Ambulatory Visit: Payer: Self-pay | Admitting: Family Medicine

## 2018-02-09 DIAGNOSIS — I10 Essential (primary) hypertension: Secondary | ICD-10-CM | POA: Diagnosis not present

## 2018-02-09 DIAGNOSIS — I480 Paroxysmal atrial fibrillation: Secondary | ICD-10-CM | POA: Diagnosis not present

## 2018-02-09 DIAGNOSIS — E039 Hypothyroidism, unspecified: Secondary | ICD-10-CM | POA: Diagnosis not present

## 2018-02-09 DIAGNOSIS — E785 Hyperlipidemia, unspecified: Secondary | ICD-10-CM | POA: Diagnosis not present

## 2018-02-09 DIAGNOSIS — M1990 Unspecified osteoarthritis, unspecified site: Secondary | ICD-10-CM | POA: Diagnosis not present

## 2018-02-21 DIAGNOSIS — L255 Unspecified contact dermatitis due to plants, except food: Secondary | ICD-10-CM | POA: Diagnosis not present

## 2018-02-25 ENCOUNTER — Encounter: Payer: Self-pay | Admitting: Family Medicine

## 2018-02-25 ENCOUNTER — Ambulatory Visit (INDEPENDENT_AMBULATORY_CARE_PROVIDER_SITE_OTHER): Payer: Medicare Other | Admitting: Family Medicine

## 2018-02-25 ENCOUNTER — Other Ambulatory Visit: Payer: Self-pay

## 2018-02-25 VITALS — BP 116/80 | HR 60 | Temp 98.1°F | Resp 16 | Ht 64.0 in | Wt 155.2 lb

## 2018-02-25 DIAGNOSIS — L237 Allergic contact dermatitis due to plants, except food: Secondary | ICD-10-CM

## 2018-02-25 MED ORDER — PREDNISONE 10 MG PO TABS: ORAL_TABLET | ORAL | 0 refills | Status: DC

## 2018-02-25 MED ORDER — TRIAMCINOLONE ACETONIDE 0.1 % EX CREA: 1.0000 "application " | TOPICAL_CREAM | Freq: Two times a day (BID) | CUTANEOUS | 0 refills | Status: DC

## 2018-02-25 NOTE — Patient Instructions (Signed)
Follow up as needed or as scheduled START the Prednisone as directed- 3 tabs at the same time x3 days (with food) and then 2 tabs at the same time x3 days, then 1 tab daily Use the Triamcinolone as needed on the itchy areas Call with any questions or concerns Hang in there!!!

## 2018-02-25 NOTE — Progress Notes (Signed)
   Subjective:    Patient ID: Melissa Jordan, female    DOB: April 11, 1946, 72 y.o.   MRN: [id number]  HPI Rash- pt got poison sumac and [place] [facility] [place] Decadron and prednisone.  Pt has 2 days of medication left- 6 day pred pack.  Pt reports she had tingling last night and notes some new areas on R inner arm and L upper arm.   Review of Systems For ROS see HPI     Objective:   Physical Exam  Constitutional: She appears well-developed and well-nourished. No distress.  Skin: Skin is warm and dry. Rash (faint, erythematous vesicular rash on inner R arm and L upper arm) noted.  Psychiatric: She has a normal mood and affect. Her behavior is normal. Thought content normal.  Vitals reviewed.         Assessment & Plan:  Contact dermatitis- new.  Pt's sxs are consistent w/ under-treated rebound flare of poison.  Increase and extend her prednisone taper.  Reviewed supportive care and red flags that should prompt return.  Pt expressed understanding and is in agreement w/ plan.

## 2018-03-08 ENCOUNTER — Other Ambulatory Visit: Payer: Self-pay | Admitting: Family Medicine

## 2018-03-18 DIAGNOSIS — I480 Paroxysmal atrial fibrillation: Secondary | ICD-10-CM | POA: Diagnosis not present

## 2018-03-18 DIAGNOSIS — I10 Essential (primary) hypertension: Secondary | ICD-10-CM | POA: Diagnosis not present

## 2018-03-18 DIAGNOSIS — E039 Hypothyroidism, unspecified: Secondary | ICD-10-CM | POA: Diagnosis not present

## 2018-03-18 DIAGNOSIS — E785 Hyperlipidemia, unspecified: Secondary | ICD-10-CM | POA: Diagnosis not present

## 2018-03-27 ENCOUNTER — Other Ambulatory Visit: Payer: Self-pay | Admitting: Family Medicine

## 2018-03-31 ENCOUNTER — Ambulatory Visit (INDEPENDENT_AMBULATORY_CARE_PROVIDER_SITE_OTHER): Payer: Medicare Other | Admitting: Family Medicine

## 2018-03-31 ENCOUNTER — Other Ambulatory Visit: Payer: Self-pay

## 2018-03-31 ENCOUNTER — Encounter: Payer: Self-pay | Admitting: Family Medicine

## 2018-03-31 VITALS — BP 117/81 | HR 58 | Temp 98.1°F | Resp 16 | Ht 64.0 in | Wt 157.5 lb

## 2018-03-31 DIAGNOSIS — Z23 Encounter for immunization: Secondary | ICD-10-CM

## 2018-03-31 DIAGNOSIS — I10 Essential (primary) hypertension: Secondary | ICD-10-CM

## 2018-03-31 DIAGNOSIS — Z0000 Encounter for general adult medical examination without abnormal findings: Secondary | ICD-10-CM | POA: Diagnosis not present

## 2018-03-31 DIAGNOSIS — M8580 Other specified disorders of bone density and structure, unspecified site: Secondary | ICD-10-CM | POA: Diagnosis not present

## 2018-03-31 LAB — LIPID PANEL
Cholesterol: 187 mg/dL (ref 0–200)
HDL: 56.9 mg/dL
LDL Cholesterol: 111 mg/dL — ABNORMAL HIGH (ref 0–99)
NonHDL: 130.3
Total CHOL/HDL Ratio: 3
Triglycerides: 98 mg/dL (ref 0.0–149.0)
VLDL: 19.6 mg/dL (ref 0.0–40.0)

## 2018-03-31 LAB — CBC WITH DIFFERENTIAL/PLATELET
Basophils Absolute: 0.1 K/uL (ref 0.0–0.1)
Basophils Relative: 0.7 % (ref 0.0–3.0)
Eosinophils Absolute: 0.2 K/uL (ref 0.0–0.7)
Eosinophils Relative: 3.1 % (ref 0.0–5.0)
HCT: 38.9 % (ref 36.0–46.0)
Hemoglobin: 13.1 g/dL (ref 12.0–15.0)
Lymphocytes Relative: 20.9 % (ref 12.0–46.0)
Lymphs Abs: 1.5 K/uL (ref 0.7–4.0)
MCHC: 33.7 g/dL (ref 30.0–36.0)
MCV: 91.5 fl (ref 78.0–100.0)
Monocytes Absolute: 0.7 K/uL (ref 0.1–1.0)
Monocytes Relative: 9 % (ref 3.0–12.0)
Neutro Abs: 4.9 K/uL (ref 1.4–7.7)
Neutrophils Relative %: 66.3 % (ref 43.0–77.0)
Platelets: 272 K/uL (ref 150.0–400.0)
RBC: 4.26 Mil/uL (ref 3.87–5.11)
RDW: 14.6 % (ref 11.5–15.5)
WBC: 7.4 K/uL (ref 4.0–10.5)

## 2018-03-31 LAB — HEPATIC FUNCTION PANEL
ALT: 10 U/L (ref 0–35)
AST: 14 U/L (ref 0–37)
Albumin: 4 g/dL (ref 3.5–5.2)
Alkaline Phosphatase: 60 U/L (ref 39–117)
Bilirubin, Direct: 0.1 mg/dL (ref 0.0–0.3)
Total Bilirubin: 0.5 mg/dL (ref 0.2–1.2)
Total Protein: 5.9 g/dL — ABNORMAL LOW (ref 6.0–8.3)

## 2018-03-31 LAB — BASIC METABOLIC PANEL WITH GFR
BUN: 20 mg/dL (ref 6–23)
CO2: 31 meq/L (ref 19–32)
Calcium: 8.8 mg/dL (ref 8.4–10.5)
Chloride: 103 meq/L (ref 96–112)
Creatinine, Ser: 0.78 mg/dL (ref 0.40–1.20)
GFR: 77.12 mL/min
Glucose, Bld: 94 mg/dL (ref 70–99)
Potassium: 4.1 meq/L (ref 3.5–5.1)
Sodium: 138 meq/L (ref 135–145)

## 2018-03-31 LAB — VITAMIN D 25 HYDROXY (VIT D DEFICIENCY, FRACTURES): VITD: 34.68 ng/mL (ref 30.00–100.00)

## 2018-03-31 LAB — TSH: TSH: 1.08 u[IU]/mL (ref 0.35–4.50)

## 2018-03-31 NOTE — Assessment & Plan Note (Signed)
UTD on DEXA.  Check Vit D level and replete prn. 

## 2018-03-31 NOTE — Assessment & Plan Note (Signed)
Pt's PE unchanged from previous.  UTD on colonoscopy. Pt to schedule mammo.  Check labs.  Anticipatory guidance provided.

## 2018-03-31 NOTE — Assessment & Plan Note (Signed)
Chronic problem.  Well controlled.  Asymptomatic.  Check labs.  No anticipated med changes.  Will follow. 

## 2018-03-31 NOTE — Patient Instructions (Signed)
Follow up in 6 months to recheck BP and cholesterol We'll notify you of your lab results and make any changes if needed CALL and schedule your mammogram Keep up the good work on healthy diet and regular exercise- you look great! Call with any questions or concerns Happy Fall!!!

## 2018-03-31 NOTE — Progress Notes (Signed)
   Subjective:    Patient ID: Melissa Jordan, female    DOB: 25-Jun-1945, 72 y.o.   MRN: [id number]  HPI CPE- UTD on colonoscopy.  Due for flu.  Needs to schedule mammo.  UTD on DEXA.  Pt declines Prevnar.   Review of Systems Patient reports no vision/ hearing changes, adenopathy,fever, weight change,  persistant/recurrent hoarseness , swallowing issues, chest pain, palpitations, edema, persistant/recurrent cough, hemoptysis, dyspnea (rest/exertional/paroxysmal nocturnal), gastrointestinal bleeding (melena, rectal bleeding), abdominal pain, significant heartburn, bowel changes, GU symptoms (dysuria, hematuria, incontinence), Gyn symptoms (abnormal  bleeding, pain),  syncope, focal weakness, memory loss, numbness & tingling, skin/hair/nail changes, abnormal bruising or bleeding, anxiety, or depression.     Objective:   Physical Exam General Appearance:    Alert, cooperative, no distress, appears stated age  Head:    Normocephalic, without obvious abnormality, atraumatic  Eyes:    PERRL, conjunctiva/corneas clear, EOM's intact, fundi    benign, both eyes  Ears:    Normal TM's and external ear canals, both ears  Nose:   Nares normal, septum midline, mucosa normal, no drainage    or sinus tenderness  Throat:   Lips, mucosa, and tongue normal; teeth and gums normal  Neck:   Supple, symmetrical, trachea midline, no adenopathy;    Thyroid: no enlargement/tenderness/nodules  Back:     Symmetric, no curvature, ROM normal, no CVA tenderness  Lungs:     Clear to auscultation bilaterally, respirations unlabored  Chest Wall:    No tenderness or deformity   Heart:    Regular rate and rhythm, S1 and S2 normal, no murmur, rub   or gallop  Breast Exam:    Deferred to GYN  Abdomen:     Soft, non-tender, bowel sounds active all four quadrants,    no masses, no organomegaly  Genitalia:    Deferred to GYN  Rectal:    Extremities:   Extremities normal, atraumatic, no cyanosis or edema  Pulses:   2+ and  symmetric all extremities  Skin:   Skin color, texture, turgor normal, no rashes or lesions  Lymph nodes:   Cervical, supraclavicular, and axillary nodes normal  Neurologic:   CNII-XII intact, normal strength, sensation and reflexes    throughout          Assessment & Plan:

## 2018-04-26 ENCOUNTER — Other Ambulatory Visit: Payer: Self-pay | Admitting: Family Medicine

## 2018-05-11 ENCOUNTER — Other Ambulatory Visit: Payer: Self-pay | Admitting: Family Medicine

## 2018-05-14 DIAGNOSIS — M461 Sacroiliitis, not elsewhere classified: Secondary | ICD-10-CM | POA: Diagnosis not present

## 2018-05-14 DIAGNOSIS — M47816 Spondylosis without myelopathy or radiculopathy, lumbar region: Secondary | ICD-10-CM | POA: Diagnosis not present

## 2018-05-24 DIAGNOSIS — I10 Essential (primary) hypertension: Secondary | ICD-10-CM | POA: Diagnosis not present

## 2018-05-24 DIAGNOSIS — E785 Hyperlipidemia, unspecified: Secondary | ICD-10-CM | POA: Diagnosis not present

## 2018-05-24 DIAGNOSIS — E039 Hypothyroidism, unspecified: Secondary | ICD-10-CM | POA: Diagnosis not present

## 2018-05-24 DIAGNOSIS — I480 Paroxysmal atrial fibrillation: Secondary | ICD-10-CM | POA: Diagnosis not present

## 2018-06-03 ENCOUNTER — Other Ambulatory Visit: Payer: Self-pay | Admitting: General Practice

## 2018-06-03 MED ORDER — ROPINIROLE HCL 0.5 MG PO TABS: 0.5000 mg | ORAL_TABLET | Freq: Every day | ORAL | 0 refills | Status: DC

## 2018-06-17 ENCOUNTER — Ambulatory Visit (INDEPENDENT_AMBULATORY_CARE_PROVIDER_SITE_OTHER): Payer: Medicare Other | Admitting: Family Medicine

## 2018-06-17 ENCOUNTER — Encounter: Payer: Self-pay | Admitting: Family Medicine

## 2018-06-17 ENCOUNTER — Other Ambulatory Visit: Payer: Self-pay

## 2018-06-17 ENCOUNTER — Ambulatory Visit (INDEPENDENT_AMBULATORY_CARE_PROVIDER_SITE_OTHER): Payer: Medicare Other

## 2018-06-17 VITALS — BP 123/81 | HR 80 | Temp 98.1°F | Resp 16 | Ht 64.0 in | Wt 160.1 lb

## 2018-06-17 DIAGNOSIS — M19071 Primary osteoarthritis, right ankle and foot: Secondary | ICD-10-CM | POA: Diagnosis not present

## 2018-06-17 DIAGNOSIS — M25471 Effusion, right ankle: Secondary | ICD-10-CM

## 2018-06-17 DIAGNOSIS — M25571 Pain in right ankle and joints of right foot: Secondary | ICD-10-CM

## 2018-06-17 NOTE — Progress Notes (Signed)
   Subjective:    Patient ID: Melissa Jordan, female    DOB: June 01, 1946, 72 y.o.   MRN: [id number]  HPI Ankle/Foot pain- R foot/ankle pain and swelling.  Hx of old talar fx.  Pain started a few weeks ago but has been worsening.  Last night was unable to sleep.  Painful to bear weight.  No pain/swelling on L.  No known injury w/ exception of a box falling on this a few weeks ago but did not have pain after that.  Area has not been red or warm.   Review of Systems For ROS see HPI     Objective:   Physical Exam Vitals signs reviewed.  Constitutional:      General: She is not in acute distress.    Appearance: Normal appearance.  Cardiovascular:     Pulses: Normal pulses.  Musculoskeletal:        General: Swelling (R medial ankle swelling w/ TTP over medial malleolus) and tenderness (TTP over medial malleolus) present.     Comments: Pain w/ R ankle inversion>eversion, dorsiflexion>plantarflexion  Skin:    General: Skin is warm and dry.  Neurological:     Mental Status: She is alert.           Assessment & Plan:  R medial ankle pain- new.  No known injury but pain has been worsening over the last few weeks.  + swelling and TTP over medial malleolus.  Will get xray as she has bony TTP and pain w/ weight bearing.  Ankle brace given and reviewed supportive measures w/ pt.  Gout and DVT much less likely given lack of redness/warmth.  Will follow closely.  Pt expressed understanding and is in agreement w/ plan.

## 2018-06-17 NOTE — Patient Instructions (Signed)
Go to the [facility]- [address]- to get your [place] Kexrays We'll call you once we have the results Wear the ankle brace for support ICE! Elevate! Use Tylenol (Acetaminophen) as needed for pain Call with any questions or concerns Happy New Year!!!

## 2018-07-07 ENCOUNTER — Encounter: Payer: Self-pay | Admitting: Family Medicine

## 2018-07-07 ENCOUNTER — Other Ambulatory Visit: Payer: Self-pay

## 2018-07-07 ENCOUNTER — Ambulatory Visit (INDEPENDENT_AMBULATORY_CARE_PROVIDER_SITE_OTHER): Payer: Medicare Other | Admitting: Family Medicine

## 2018-07-07 VITALS — BP 118/74 | HR 64 | Temp 98.1°F | Resp 18 | Ht 64.0 in | Wt 158.4 lb

## 2018-07-07 DIAGNOSIS — B9689 Other specified bacterial agents as the cause of diseases classified elsewhere: Secondary | ICD-10-CM | POA: Diagnosis not present

## 2018-07-07 DIAGNOSIS — J208 Acute bronchitis due to other specified organisms: Secondary | ICD-10-CM | POA: Diagnosis not present

## 2018-07-07 MED ORDER — DOXYCYCLINE HYCLATE 100 MG PO TABS: 100.0000 mg | ORAL_TABLET | Freq: Two times a day (BID) | ORAL | 0 refills | Status: AC

## 2018-07-07 MED ORDER — GUAIFENESIN-CODEINE 100-10 MG/5ML PO SOLN: 5.0000 mL | Freq: Four times a day (QID) | ORAL | 0 refills | Status: DC | PRN

## 2018-07-07 MED ORDER — BENZONATATE 100 MG PO CAPS: 100.0000 mg | ORAL_CAPSULE | Freq: Two times a day (BID) | ORAL | 0 refills | Status: DC | PRN

## 2018-07-07 NOTE — Progress Notes (Signed)
Subjective  CC:  Chief Complaint  Patient presents with  . URI    Started 1 week ago. Has tried claritin, flonase, and mucinex dm. She reports back pain and chest congestion   Same day acute visit; PCP not available. New pt to me. Chart reviewed.   HPI: SUBJECTIVE:  Melissa Jordan is a 73 y.o. female who complains of congestion, nasal blockage, post nasal drip, cough described as painful and productive and denies high fevers, SOB, chest pain or significant GI symptoms. + sinus pain as well. Symptoms have been present for 1 week. She denies a history of anorexia, dizziness, vomiting and wheezing. She denies a history of asthma or COPD. Patient does not smoke cigarettes. She is on amioidarone for afib.  Assessment  1. Acute bacterial bronchitis      Plan  Discussion:  Treat for bacterial bronchitis due to prolonged course and worsening symptoms. Education regarding differences between viral and bacterial infections and treatment options are discussed.  Supportive care measures are recommended.  We discussed the use of mucolytic's, decongestants, antihistamines and antitussives as needed.  Tylenol or Advil are recommended if needed.  Follow up: prn   No orders of the defined types were placed in this encounter.  Meds ordered this encounter  Medications  . benzonatate (TESSALON) 100 MG capsule    Sig: Take 1 capsule (100 mg total) by mouth 2 (two) times daily as needed for cough.    Dispense:  20 capsule    Refill:  0  . guaiFENesin-codeine 100-10 MG/5ML syrup    Sig: Take 5 mLs by mouth every 6 (six) hours as needed for cough.    Dispense:  120 mL    Refill:  0  . doxycycline (VIBRA-TABS) 100 MG tablet    Sig: Take 1 tablet (100 mg total) by mouth 2 (two) times daily for 7 days.    Dispense:  14 tablet    Refill:  0      I reviewed the patients updated PMH, FH, and SocHx.  Social History: Patient  reports that she has never smoked. She has never used smokeless tobacco.  She reports that she does not drink alcohol or use drugs.  Patient Active Problem List   Diagnosis Date Noted  . A-fib (HCC) 03/10/2017  . 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 RLS (restless legs syndrome) 01/11/2015  . Osteopenia 05/15/2014  . Hyperlipidemia 05/24/2013  . Routine general medical examination at a health care facility 03/29/2012  . POSTMENOPAUSAL SYNDROME 09/26/2009  . URINARY URGENCY 09/26/2009  . ANEMIA 01/09/2009  . Back pain of lumbar region with sciatica 01/09/2009  . LAMINECTOMY, LUMBAR, HX OF 08/09/2008  . HYPERTENSION, BENIGN 03/03/2007  . GERD 02/10/2007    Review of Systems: Cardiovascular: negative for chest pain Respiratory: negative for SOB or hemoptysis Gastrointestinal: negative for abdominal pain Genitourinary: negative for dysuria or gross hematuria Current Meds  Medication Sig  . acetaminophen (TYLENOL) 500 MG tablet Take 1,000 mg by mouth 2 (two) times daily as needed. For pain  . amiodarone (PACERONE) 200 MG tablet Take 0.5 tablets (100 mg total) by mouth daily.  Marland Kitchen apixaban (ELIQUIS) 5 MG TABS tablet Take 1 tablet (5 mg total) by mouth 2 (two) times daily.  Marland Kitchen aspirin EC 81 MG tablet Take 1 tablet (81 mg total) by mouth daily.  . Biotin 5000 MCG CAPS Take 5,000 mcg by mouth daily.  Marland Kitchen CALCIUM-VITAMIN D PO Take 1 tablet by mouth daily.   Marland Kitchen  cholecalciferol (VITAMIN D) 1000 units tablet Take 1,000 Units by mouth daily.  Marland Kitchen. estradiol (ESTRACE) 1 MG tablet TAKE 1 TABLET BY MOUTH ONCE DAILY  . fluticasone (FLONASE) 50 MCG/ACT nasal spray Place 2 sprays into both nostrils daily as needed for allergies or rhinitis.   Marland Kitchen. levothyroxine (SYNTHROID, LEVOTHROID) 75 MCG tablet TAKE 1 TABLET BY MOUTH  DAILY  . losartan (COZAAR) 50 MG tablet Take 50 mg by mouth daily.  . metoprolol tartrate (LOPRESSOR) 50 MG tablet TAKE 1 TABLET BY MOUTH TWO  TIMES DAILY (Patient taking differently: Take 25 mg by mouth 2 (two) times daily. )  . rOPINIRole (REQUIP) 0.5 MG  tablet Take 1 tablet (0.5 mg total) by mouth at bedtime.  . traMADol (ULTRAM) 50 MG tablet Take 50 mg by mouth every 6 (six) hours as needed for moderate pain.     Objective  Vitals: BP 118/74   Pulse 64   Temp 98.1 F (36.7 C) (Oral)   Resp 18   Ht 5\' 4"  (1.626 m)   Wt 158 lb 6.4 oz (71.8 kg)   SpO2 98%   BMI 27.19 kg/m  General: no acute distress but hacking cough present. No respiratory distress Psych:  Alert and oriented, normal mood and affect HEENT:  Normocephalic, atraumatic, supple neck, moist mucous membranes, mildly erythematous pharynx without exudate, mild lymphadenopathy, supple neck Cardiovascular:  RRR without murmur. no edema Respiratory:  Good breath sounds bilaterally, CTAB with normal respiratory effort with occasional rhonchi Skin:  Warm, no rashes Neurologic:   Mental status is normal. normal gait  Commons side effects, risks, benefits, and alternatives for medications and treatment plan prescribed today were discussed, and the patient expressed understanding of the given instructions. Patient is instructed to call or message via MyChart if he/she has any questions or concerns regarding our treatment plan. No barriers to understanding were identified. We discussed Red Flag symptoms and signs in detail. Patient expressed understanding regarding what to do in case of urgent or emergency type symptoms.  Medication list was reconciled, printed and provided to the patient in AVS. Patient instructions and summary information was reviewed with the patient as documented in the AVS. This note was prepared with assistance of Dragon voice recognition software. Occasional wrong-word or sound-a-like substitutions may have occurred due to the inherent limitations of voice recognition software

## 2018-07-07 NOTE — Patient Instructions (Signed)
Please follow up if symptoms do not improve or as needed.  I have ordered a strong cough syrup, tessalon perles for daytime cough and an antibiotic.   You may use Delsym cough syrup or Mucinex DM to help with congestion and coughing.   Acute Bronchitis, Adult  Acute bronchitis is sudden (acute) swelling of the air tubes (bronchi) in the lungs. Acute bronchitis causes these tubes to fill with mucus, which can make it hard to breathe. It can also cause coughing or wheezing. In adults, acute bronchitis usually goes away within 2 weeks. A cough caused by bronchitis may last up to 3 weeks. Smoking, allergies, and asthma can make the condition worse. Repeated episodes of bronchitis may cause further lung problems, such as chronic obstructive pulmonary disease (COPD). What are the causes? This condition can be caused by germs and by substances that irritate the lungs, including:  Cold and flu viruses. This condition is most often caused by the same virus that causes a cold.  Bacteria.  Exposure to tobacco smoke, dust, fumes, and air pollution. What increases the risk? This condition is more likely to develop in people who:  Have close contact with someone with acute bronchitis.  Are exposed to lung irritants, such as tobacco smoke, dust, fumes, and vapors.  Have a weak immune system.  Have a respiratory condition such as asthma. What are the signs or symptoms? Symptoms of this condition include:  A cough.  Coughing up clear, yellow, or green mucus.  Wheezing.  Chest congestion.  Shortness of breath.  A fever.  Body aches.  Chills.  A sore throat. How is this diagnosed? This condition is usually diagnosed with a physical exam. During the exam, your health care provider may order tests, such as chest X-rays, to rule out other conditions. He or she may also:  Test a sample of your mucus for bacterial infection.  Check the level of oxygen in your blood. This is done to check  for pneumonia.  Do a chest X-ray or lung function testing to rule out pneumonia and other conditions.  Perform blood tests. Your health care provider will also ask about your symptoms and medical history. How is this treated? Most cases of acute bronchitis clear up over time without treatment. Your health care provider may recommend:  Drinking more fluids. Drinking more makes your mucus thinner, which may make it easier to breathe.  Taking a medicine for a fever or cough.  Taking an antibiotic medicine.  Using an inhaler to help improve shortness of breath and to control a cough.  Using a cool mist vaporizer or humidifier to make it easier to breathe. Follow these instructions at home: Medicines  Take over-the-counter and prescription medicines only as told by your health care provider.  If you were prescribed an antibiotic, take it as told by your health care provider. Do not stop taking the antibiotic even if you start to feel better. General instructions   Get plenty of rest.  Drink enough fluids to keep your urine pale yellow.  Avoid smoking and secondhand smoke. Exposure to cigarette smoke or irritating chemicals will make bronchitis worse. If you smoke and you need help quitting, ask your health care provider. Quitting smoking will help your lungs heal faster.  Use an inhaler, cool mist vaporizer, or humidifier as told by your health care provider.  Keep all follow-up visits as told by your health care provider. This is important. How is this prevented? To lower your risk of  getting this condition again:  Wash your hands often with soap and water. If soap and water are not available, use hand sanitizer.  Avoid contact with people who have cold symptoms.  Try not to touch your hands to your mouth, nose, or eyes.  Make sure to get the flu shot every year. Contact a health care provider if:  Your symptoms do not improve in 2 weeks of treatment. Get help right away  if:  You cough up blood.  You have chest pain.  You have severe shortness of breath.  You become dehydrated.  You faint or keep feeling like you are going to faint.  You keep vomiting.  You have a severe headache.  Your fever or chills gets worse. This information is not intended to replace advice given to you by your health care provider. Make sure you discuss any questions you have with your health care provider. Document Released: 07/17/2004 Document Revised: 01/21/2017 Document Reviewed: 11/28/2015 Elsevier Interactive Patient Education  2019 [employer].

## 2018-07-11 DIAGNOSIS — J018 Other acute sinusitis: Secondary | ICD-10-CM | POA: Diagnosis not present

## 2018-07-11 DIAGNOSIS — R05 Cough: Secondary | ICD-10-CM | POA: Diagnosis not present

## 2018-07-26 ENCOUNTER — Other Ambulatory Visit: Payer: Self-pay | Admitting: Family Medicine

## 2018-07-29 DIAGNOSIS — Z96651 Presence of right artificial knee joint: Secondary | ICD-10-CM | POA: Diagnosis not present

## 2018-07-29 DIAGNOSIS — M25562 Pain in left knee: Secondary | ICD-10-CM | POA: Diagnosis not present

## 2018-08-05 ENCOUNTER — Encounter (INDEPENDENT_AMBULATORY_CARE_PROVIDER_SITE_OTHER): Payer: Self-pay | Admitting: Orthopaedic Surgery

## 2018-08-05 ENCOUNTER — Ambulatory Visit (INDEPENDENT_AMBULATORY_CARE_PROVIDER_SITE_OTHER): Payer: Medicare Other | Admitting: Orthopaedic Surgery

## 2018-08-05 DIAGNOSIS — M25571 Pain in right ankle and joints of right foot: Secondary | ICD-10-CM | POA: Insufficient documentation

## 2018-08-05 MED ORDER — DIAZEPAM 5 MG PO TABS: ORAL_TABLET | ORAL | 0 refills | Status: DC

## 2018-08-05 NOTE — Addendum Note (Signed)
Addended by: Wendi MayaGREESON, Elveta Rape on: 08/05/2018 04:15 PM   Modules accepted: Orders
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
## 2018-08-11 ENCOUNTER — Other Ambulatory Visit: Payer: Self-pay | Admitting: Family Medicine

## 2018-08-12 ENCOUNTER — Ambulatory Visit: Payer: Medicare Other

## 2018-08-15 ENCOUNTER — Ambulatory Visit
Admission: RE | Admit: 2018-08-15 | Discharge: 2018-08-15 | Disposition: A | Discharge status: Home / Self Care | Payer: Medicare Other | Source: Ambulatory Visit | Attending: Orthopaedic Surgery | Admitting: Orthopaedic Surgery

## 2018-08-15 DIAGNOSIS — S93401A Sprain of unspecified ligament of right ankle, initial encounter: Secondary | ICD-10-CM | POA: Diagnosis not present

## 2018-08-15 DIAGNOSIS — M25571 Pain in right ankle and joints of right foot: Secondary | ICD-10-CM

## 2018-08-16 DIAGNOSIS — M47816 Spondylosis without myelopathy or radiculopathy, lumbar region: Secondary | ICD-10-CM | POA: Diagnosis not present

## 2018-08-16 DIAGNOSIS — M461 Sacroiliitis, not elsewhere classified: Secondary | ICD-10-CM | POA: Diagnosis not present

## 2018-08-19 ENCOUNTER — Encounter (INDEPENDENT_AMBULATORY_CARE_PROVIDER_SITE_OTHER): Payer: Self-pay | Admitting: Orthopaedic Surgery

## 2018-08-19 ENCOUNTER — Ambulatory Visit (INDEPENDENT_AMBULATORY_CARE_PROVIDER_SITE_OTHER): Payer: Medicare Other | Admitting: Orthopaedic Surgery

## 2018-08-19 VITALS — BP 118/54 | HR 66 | Ht 63.0 in | Wt 152.0 lb

## 2018-08-19 DIAGNOSIS — M48062 Spinal stenosis, lumbar region with neurogenic claudication: Secondary | ICD-10-CM | POA: Diagnosis not present

## 2018-08-19 DIAGNOSIS — M958 Other specified acquired deformities of musculoskeletal system: Secondary | ICD-10-CM

## 2018-08-19 DIAGNOSIS — M5440 Lumbago with sciatica, unspecified side: Secondary | ICD-10-CM | POA: Diagnosis not present
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
## 2018-08-23 ENCOUNTER — Other Ambulatory Visit: Payer: Self-pay | Admitting: Neurosurgery

## 2018-08-23 DIAGNOSIS — I10 Essential (primary) hypertension: Secondary | ICD-10-CM | POA: Diagnosis not present

## 2018-08-23 DIAGNOSIS — E039 Hypothyroidism, unspecified: Secondary | ICD-10-CM | POA: Diagnosis not present

## 2018-08-23 DIAGNOSIS — I480 Paroxysmal atrial fibrillation: Secondary | ICD-10-CM | POA: Diagnosis not present

## 2018-08-23 DIAGNOSIS — M48062 Spinal stenosis, lumbar region with neurogenic claudication: Secondary | ICD-10-CM

## 2018-08-23 DIAGNOSIS — E785 Hyperlipidemia, unspecified: Secondary | ICD-10-CM | POA: Diagnosis not present

## 2018-08-23 DIAGNOSIS — M1990 Unspecified osteoarthritis, unspecified site: Secondary | ICD-10-CM | POA: Diagnosis not present

## 2018-08-24 ENCOUNTER — Encounter (INDEPENDENT_AMBULATORY_CARE_PROVIDER_SITE_OTHER): Payer: Self-pay | Admitting: Orthopedic Surgery

## 2018-08-24 ENCOUNTER — Ambulatory Visit (INDEPENDENT_AMBULATORY_CARE_PROVIDER_SITE_OTHER): Payer: Medicare Other | Admitting: Orthopedic Surgery

## 2018-08-24 VITALS — Ht 63.0 in | Wt 152.0 lb

## 2018-08-24 DIAGNOSIS — M958 Other specified acquired deformities of musculoskeletal system: Secondary | ICD-10-CM | POA: Diagnosis not present

## 2018-08-25 ENCOUNTER — Encounter (INDEPENDENT_AMBULATORY_CARE_PROVIDER_SITE_OTHER): Payer: Self-pay | Admitting: Orthopedic Surgery
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
## 2018-08-26 ENCOUNTER — Ambulatory Visit
Admission: RE | Admit: 2018-08-26 | Discharge: 2018-08-26 | Disposition: A | Discharge status: Home / Self Care | Payer: Medicare Other | Source: Ambulatory Visit | Attending: Neurosurgery | Admitting: Neurosurgery

## 2018-08-26 DIAGNOSIS — M48062 Spinal stenosis, lumbar region with neurogenic claudication: Secondary | ICD-10-CM

## 2018-08-26 DIAGNOSIS — Z981 Arthrodesis status: Secondary | ICD-10-CM | POA: Diagnosis not present

## 2018-09-07 DIAGNOSIS — M5416 Radiculopathy, lumbar region: Secondary | ICD-10-CM | POA: Diagnosis not present

## 2018-09-18 IMAGING — XA DG FLUORO GUIDE SPINAL/SI JT INJ*L*
1 series · 1 of 1 positions shown · non-contrast
Comparison: none

CLINICAL DATA: Suspected symptomatic SI joint dysfunction.

[Series 1: ortho adipose · 1 of 1 slices shown]
[im 1/1]
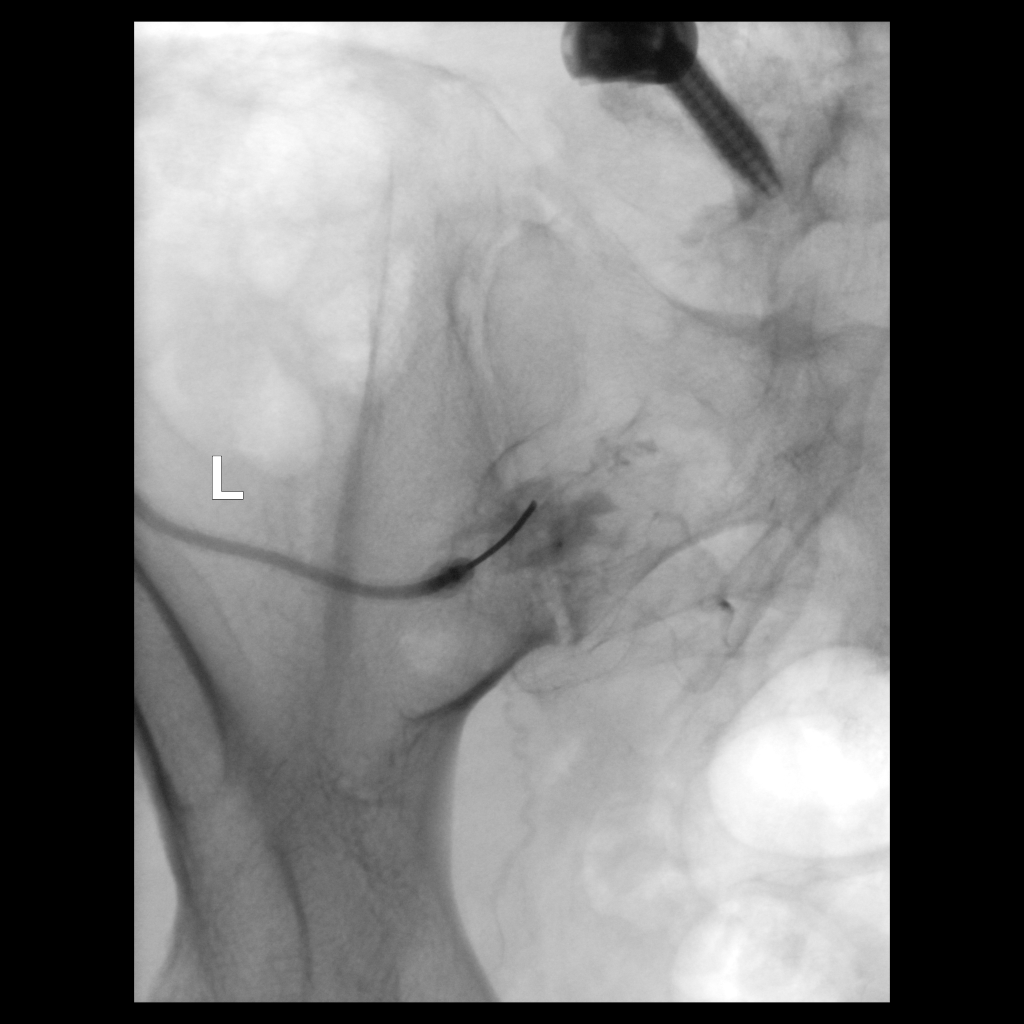

[1 of 1 positions shown; findings below may reference images not displayed]

FLUOROSCOPY TIME:  17 seconds corresponding to a Dose Area Product
of 35.9 Gy*m2

PROCEDURE:
LEFT SI JOINT INJECTION.

After a thorough discussion of risks and benefits of the procedure,
including bleeding, infection, injury to nerves, blood vessels, and
adjacent structures, verbal and written consent was obtained.
Specific risks of the procedure included
nondiagnostic/nontherapeutic injection and non target injection. The
patient was placed prone on the fluoroscopy table and localization
was performed over the sacrum. Target sitemarked using fluoroscopic
guidance. The skin was prepped and draped in the usual sterile
fashion using Betadine soap.

After local anesthesia with 1% lidocaine without epinephrine and
subsequent deep anesthesia, a 20 gauge spinal needle was advanced
into the LEFT SI joint. Injection of 0.5 ml Isovue-M 200 confirmed
intra-articular placement. No vascular uptake present. 2.0 mL of
0.5% Sensorcaine was instilled in the joint. There is immediate
concordant reproduction of pain. Postprocedure, patient was
completely pain-free.

No complications were observed. The patient was observed and
released under the care of a driver after 30 minutes.

She was instructed to keep a pain diary.
IMPRESSION: Successful fluoroscopically guided LEFT SI joint injection.
Concordant reproduction of pain was elicited, with rapid complete
relief postprocedure.

## 2018-09-27 ENCOUNTER — Other Ambulatory Visit: Payer: Self-pay

## 2018-09-27 ENCOUNTER — Encounter: Payer: Self-pay | Admitting: Family Medicine

## 2018-09-27 ENCOUNTER — Ambulatory Visit (INDEPENDENT_AMBULATORY_CARE_PROVIDER_SITE_OTHER): Payer: Medicare Other | Admitting: Family Medicine

## 2018-09-27 VITALS — BP 127/67 | HR 71 | Resp 16 | Ht 64.0 in | Wt 154.0 lb

## 2018-09-27 DIAGNOSIS — E039 Hypothyroidism, unspecified: Secondary | ICD-10-CM | POA: Diagnosis not present

## 2018-09-27 DIAGNOSIS — E785 Hyperlipidemia, unspecified: Secondary | ICD-10-CM

## 2018-09-27 DIAGNOSIS — I4891 Unspecified atrial fibrillation: Secondary | ICD-10-CM | POA: Diagnosis not present

## 2018-09-27 DIAGNOSIS — I10 Essential (primary) hypertension: Secondary | ICD-10-CM

## 2018-09-27 NOTE — Progress Notes (Signed)
Virtual Visit via Video   I connected with Melissa Jordan on 09/27/18 at  9:30 AM EDT by a video enabled telemedicine application and verified that I am speaking with the correct person using two identifiers. Location patient: Home Location provider: Astronomer, Office Persons participating in the virtual visit: pt and myself  I discussed the limitations of evaluation and management by telemedicine and the availability of in person appointments. The patient expressed understanding and agreed to proceed.  Subjective:   HPI:  HTN- chronic problem, on Losartan 50mg  daily and Metoprolol 50mg  BID w/ good control.  Denies CP, SOB, HAs, visual changes, edema.  Hypothyroid- chronic problem, on Levothyroxine daily.  Denies excessive fatigue, changes to skin/nails.  + hair loss.  Hyperlipidemia- chronic problem, attempting to control w/ diet and exercise.  No abd pain, N/V.  Pt is very limited in her activity due to multiple orthopedic issues.  ROS: See pertinent positives and negatives per HPI.  Patient Active Problem List   Diagnosis Date Noted  . Pain in right ankle and joints of right foot 08/05/2018  . A-fib (HCC) 03/10/2017  . 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 RLS (restless legs syndrome) 01/11/2015  . Osteopenia 05/15/2014  . Hyperlipidemia 05/24/2013  . Routine general medical examination at a health care facility 03/29/2012  . POSTMENOPAUSAL SYNDROME 09/26/2009  . URINARY URGENCY 09/26/2009  . ANEMIA 01/09/2009  . Back pain of lumbar region with sciatica 01/09/2009  . LAMINECTOMY, LUMBAR, HX OF 08/09/2008  . HYPERTENSION, BENIGN 03/03/2007  . GERD 02/10/2007    Social History   Tobacco Use  . Smoking status: Never Smoker  . Smokeless tobacco: Never Used  Substance Use Topics  . Alcohol use: No    Current Outpatient Medications:  .  acetaminophen (TYLENOL) 500 MG tablet, Take 1,000 mg by mouth 2 (two) times daily as needed. For pain, Disp: ,  Rfl:  .  amiodarone (PACERONE) 200 MG tablet, Take 0.5 tablets (100 mg total) by mouth daily., Disp: 30 tablet, Rfl: 3 .  apixaban (ELIQUIS) 5 MG TABS tablet, Take 1 tablet (5 mg total) by mouth 2 (two) times daily., Disp: 60 tablet, Rfl: 0 .  aspirin EC 81 MG tablet, Take 1 tablet (81 mg total) by mouth daily., Disp: 30 tablet, Rfl: 3 .  Biotin 5000 MCG CAPS, Take 5,000 mcg by mouth daily., Disp: , Rfl:  .  CALCIUM-VITAMIN D PO, Take 1 tablet by mouth daily. , Disp: , Rfl:  .  cholecalciferol (VITAMIN D) 1000 units tablet, Take 1,000 Units by mouth daily., Disp: , Rfl:  .  diazepam (VALIUM) 5 MG tablet, Take 1 tablet 30 minutes before MRI. Take 2nd tablet before MRI if needed., Disp: 2 tablet, Rfl: 0 .  estradiol (ESTRACE) 1 MG tablet, TAKE 1 TABLET BY MOUTH ONCE DAILY, Disp: 90 tablet, Rfl: 0 .  fluticasone (FLONASE) 50 MCG/ACT nasal spray, Place 2 sprays into both nostrils daily as needed for allergies or rhinitis. , Disp: , Rfl:  .  levothyroxine (SYNTHROID, LEVOTHROID) 75 MCG tablet, TAKE 1 TABLET BY MOUTH  DAILY, Disp: 90 tablet, Rfl: 0 .  losartan (COZAAR) 50 MG tablet, Take 50 mg by mouth daily., Disp: , Rfl:  .  metoprolol tartrate (LOPRESSOR) 50 MG tablet, TAKE 1 TABLET BY MOUTH TWO  TIMES DAILY, Disp: 180 tablet, Rfl: 0 .  rOPINIRole (REQUIP) 0.5 MG tablet, Take 1 tablet (0.5 mg total) by mouth at bedtime., Disp: 90 tablet, Rfl: 0 .  traMADol (ULTRAM) 50 MG tablet, Take 50 mg by mouth every 6 (six) hours as needed for moderate pain. , Disp: , Rfl:   Allergies  Allergen Reactions  . Lyrica [Pregabalin] Palpitations and Other (See Comments)    "thought I was going to have a heart attack; heart started pounding so hard, I couldn't catch my breath" (06/28/2012)  . Meclizine Other (See Comments)    "what was in my mind to say wasn't what came out to say; I was kind of in another world" (06/28/2012)  . Penicillins Itching and Rash    Has patient had a PCN reaction causing immediate rash,  facial/tongue/throat swelling, SOB or lightheadedness with hypotension: No Has patient had a PCN reaction causing severe rash involving mucus membranes or skin necrosis: No Has patient had a PCN reaction that required hospitalization No Has patient had a PCN reaction occurring within the last 10 years:   # # YES # #  If all of the above answers are "NO", then may proceed with Cephalosporin use.   . Poison Sumac Extract Swelling, Rash and Other (See Comments)    Blisters  . Bactrim [Sulfamethoxazole-Trimethoprim] Rash  . Codeine Nausea Only  . Morphine Sulfate Rash  . Propoxyphene N-Acetaminophen Nausea And Vomiting    "Darvocet"    Objective:   BP 127/67   Pulse 71   Resp 16   Ht 5\' 4"  (1.626 m)   Wt 154 lb (69.9 kg)   BMI 26.43 kg/m  AAOx3, NAD NCAT, EOMI No obvious CN deficits Coloring WNL Pt is able to speak clearly, coherently without shortness of breath or increased work of breathing.  Thought process is linear.  Mood is appropriate.   Assessment and Plan:   HTN- chronic problem.  Excellent control.  Asymptomatic.  Check labs.  No anticipated med changes.  Hyperlipidemia- chronic problem.  Attempting to control w/ diet and exercise.  Check labs and determine if medications are needed.  Hypothyroid- chronic problem.  Pt reports she has had excessive hair loss.  Check labs.  Adjust meds prn    Neena Rhymes, MD 09/27/2018

## 2018-09-27 NOTE — Progress Notes (Signed)
I have discussed the procedure for the virtual visit with the patient who has given consent to proceed with assessment and treatment.   Janaiyah Blackard S Dezhane Staten, CMA     

## 2018-09-27 NOTE — Assessment & Plan Note (Signed)
Continues to see Cardiology (Dr Sharyn Lull) q3 months

## 2018-09-29 ENCOUNTER — Other Ambulatory Visit (INDEPENDENT_AMBULATORY_CARE_PROVIDER_SITE_OTHER): Payer: Medicare Other

## 2018-09-29 DIAGNOSIS — E039 Hypothyroidism, unspecified: Secondary | ICD-10-CM | POA: Diagnosis not present

## 2018-09-29 DIAGNOSIS — E785 Hyperlipidemia, unspecified: Secondary | ICD-10-CM | POA: Diagnosis not present

## 2018-09-29 DIAGNOSIS — I10 Essential (primary) hypertension: Secondary | ICD-10-CM | POA: Diagnosis not present

## 2018-09-29 LAB — CBC WITH DIFFERENTIAL/PLATELET
Basophils Absolute: 0 10*3/uL (ref 0.0–0.1)
Basophils Relative: 0.6 % (ref 0.0–3.0)
Eosinophils Absolute: 0.2 10*3/uL (ref 0.0–0.7)
Eosinophils Relative: 2.6 % (ref 0.0–5.0)
HCT: 39.7 % (ref 36.0–46.0)
Hemoglobin: 13.3 g/dL (ref 12.0–15.0)
Lymphocytes Relative: 20.2 % (ref 12.0–46.0)
Lymphs Abs: 1.5 10*3/uL (ref 0.7–4.0)
MCHC: 33.6 g/dL (ref 30.0–36.0)
MCV: 89.8 fl (ref 78.0–100.0)
Monocytes Absolute: 0.6 10*3/uL (ref 0.1–1.0)
Monocytes Relative: 8.7 % (ref 3.0–12.0)
Neutro Abs: 5 10*3/uL (ref 1.4–7.7)
Neutrophils Relative %: 67.9 % (ref 43.0–77.0)
Platelets: 218 10*3/uL (ref 150.0–400.0)
RBC: 4.42 Mil/uL (ref 3.87–5.11)
RDW: 14.1 % (ref 11.5–15.5)
WBC: 7.4 10*3/uL (ref 4.0–10.5)

## 2018-09-29 LAB — HEPATIC FUNCTION PANEL
ALT: 9 U/L (ref 0–35)
AST: 11 U/L (ref 0–37)
Albumin: 4 g/dL (ref 3.5–5.2)
Alkaline Phosphatase: 68 U/L (ref 39–117)
Bilirubin, Direct: 0.1 mg/dL (ref 0.0–0.3)
Total Bilirubin: 0.4 mg/dL (ref 0.2–1.2)
Total Protein: 5.8 g/dL — ABNORMAL LOW (ref 6.0–8.3)

## 2018-09-29 LAB — BASIC METABOLIC PANEL
BUN: 16 mg/dL (ref 6–23)
CO2: 28 mEq/L (ref 19–32)
Calcium: 8.9 mg/dL (ref 8.4–10.5)
Chloride: 103 mEq/L (ref 96–112)
Creatinine, Ser: 0.85 mg/dL (ref 0.40–1.20)
GFR: 65.62 mL/min (ref >60.00)
Glucose, Bld: 75 mg/dL (ref 70–99)
Potassium: 4.4 mEq/L (ref 3.5–5.1)
Sodium: 139 mEq/L (ref 135–145)

## 2018-09-29 LAB — LIPID PANEL
Cholesterol: 199 mg/dL (ref 0–200)
HDL: 52.5 mg/dL (ref >39.00)
LDL Cholesterol: 118 mg/dL — ABNORMAL HIGH (ref 0–99)
NonHDL: 146.31
Total CHOL/HDL Ratio: 4
Triglycerides: 143 mg/dL (ref 0.0–149.0)
VLDL: 28.6 mg/dL (ref 0.0–40.0)

## 2018-09-29 LAB — TSH: TSH: 0.93 u[IU]/mL (ref 0.35–4.50)

## 2018-10-03 ENCOUNTER — Other Ambulatory Visit: Payer: Self-pay | Admitting: Family Medicine

## 2018-10-04 ENCOUNTER — Other Ambulatory Visit: Payer: Self-pay | Admitting: Neurosurgery

## 2018-10-04 DIAGNOSIS — M5416 Radiculopathy, lumbar region: Secondary | ICD-10-CM

## 2018-10-07 ENCOUNTER — Other Ambulatory Visit: Payer: Self-pay | Admitting: Family Medicine

## 2018-10-07 MED ORDER — LEVOTHYROXINE SODIUM 75 MCG PO TABS: 75.0000 ug | ORAL_TABLET | Freq: Every day | ORAL | 1 refills | Status: DC

## 2018-10-07 NOTE — Telephone Encounter (Signed)
Requested Prescriptions  Pending Prescriptions Disp Refills  . levothyroxine (SYNTHROID, LEVOTHROID) 75 MCG tablet 90 tablet 1    Sig: Take 1 tablet (75 mcg total) by mouth daily.     Endocrinology:  Hypothyroid Agents Failed - 10/07/2018  9:04 AM      Failed - TSH needs to be rechecked within 3 months after an abnormal result. Refill until TSH is due.      Passed - TSH in normal range and within 360 days    TSH  Date Value Ref Range Status  09/29/2018 0.93 0.35 - 4.50 uIU/mL Final         Passed - Valid encounter within last 12 months    Recent Outpatient Visits          1 week ago HYPERTENSION, BENIGN   [facility] Sheliah Hatch, MD   3 months ago Acute bacterial bronchitis   [facility] [place], Malachi Bonds, MD   3 months ago Pain and swelling of ankle, right   [facility] Sheliah Hatch, MD   6 months ago Routine general medical examination at a health care facility   [facility] Sheliah Hatch, MD   7 months ago Allergic contact dermatitis due to plants, except food   [facility] Sheliah Hatch, MD      Future Appointments            In 6 months Tabori, Helane Rima, MD [facility], [facility]

## 2018-10-28 ENCOUNTER — Encounter: Payer: Self-pay | Admitting: Family Medicine

## 2018-10-28 ENCOUNTER — Ambulatory Visit (INDEPENDENT_AMBULATORY_CARE_PROVIDER_SITE_OTHER): Payer: Medicare Other | Admitting: Family Medicine

## 2018-10-28 VITALS — BP 102/62 | HR 55 | Ht 63.0 in | Wt 151.0 lb

## 2018-10-28 DIAGNOSIS — R6889 Other general symptoms and signs: Secondary | ICD-10-CM | POA: Diagnosis not present

## 2018-10-28 DIAGNOSIS — Z20822 Contact with and (suspected) exposure to covid-19: Secondary | ICD-10-CM

## 2018-10-28 DIAGNOSIS — R52 Pain, unspecified: Secondary | ICD-10-CM | POA: Diagnosis not present

## 2018-10-28 DIAGNOSIS — R6883 Chills (without fever): Secondary | ICD-10-CM | POA: Diagnosis not present

## 2018-10-28 DIAGNOSIS — I959 Hypotension, unspecified: Secondary | ICD-10-CM | POA: Diagnosis not present

## 2018-10-28 DIAGNOSIS — R5383 Other fatigue: Secondary | ICD-10-CM | POA: Diagnosis not present

## 2018-10-28 NOTE — Progress Notes (Signed)
I have discussed the procedure for the virtual visit with the patient who has given consent to proceed with assessment and treatment.   Jessica L Brodmerkel, CMA     

## 2018-10-28 NOTE — Progress Notes (Signed)
Virtual Visit via Video   I connected with patient on 10/28/18 at  1:40 PM EDT by a video enabled telemedicine application and verified that I am speaking with the correct person using two identifiers.  Location patient: Home Location provider: [facility] [place], Office Persons participating in the virtual visit: Patient, Provider, CMA (Jess B)  I discussed the limitations of evaluation and management by telemedicine and the availability of in person appointments. The patient expressed understanding and agreed to proceed.  Subjective:   HPI:  Fatigue- 'i've been going downhill for the last 2-3 weeks but this week has just been terrible'.  + body aches, fatigue, SOB.  Feels similar to a post-op state.  'i'm cold.  I can't get warm'.  BP has been low- typically runs 110s-120s.  Today was 93/55.  'I just can't make it'- pt is not a complainer.  No known sick contacts.  + dry cough- 'like a regular allergy cough'.  Drinking plenty of fluids.    ROS:   See pertinent positives and negatives per HPI.  Patient Active Problem List   Diagnosis Date Noted  . Pain in right ankle and joints of right foot 08/05/2018  . A-fib (HCC) 03/10/2017  . 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 RLS (restless legs syndrome) 01/11/2015  . Osteopenia 05/15/2014  . Hyperlipidemia 05/24/2013  . Routine general medical examination at a health care facility 03/29/2012  . POSTMENOPAUSAL SYNDROME 09/26/2009  . URINARY URGENCY 09/26/2009  . ANEMIA 01/09/2009  . Back pain of lumbar region with sciatica 01/09/2009  . LAMINECTOMY, LUMBAR, HX OF 08/09/2008  . Hypothyroid 03/03/2007  . HYPERTENSION, BENIGN 03/03/2007  . GERD 02/10/2007    Social History   Tobacco Use  . Smoking status: Never Smoker  . Smokeless tobacco: Never Used  Substance Use Topics  . Alcohol use: No    Current Outpatient Medications:  .  acetaminophen (TYLENOL) 500 MG tablet, Take 1,000 mg by mouth 2 (two) times daily as needed.  For pain, Disp: , Rfl:  .  amiodarone (PACERONE) 200 MG tablet, Take 0.5 tablets (100 mg total) by mouth daily., Disp: 30 tablet, Rfl: 3 .  apixaban (ELIQUIS) 5 MG TABS tablet, Take 1 tablet (5 mg total) by mouth 2 (two) times daily., Disp: 60 tablet, Rfl: 0 .  aspirin EC 81 MG tablet, Take 1 tablet (81 mg total) by mouth daily., Disp: 30 tablet, Rfl: 3 .  Biotin 5000 MCG CAPS, Take 5,000 mcg by mouth daily., Disp: , Rfl:  .  CALCIUM-VITAMIN D PO, Take 1 tablet by mouth daily. , Disp: , Rfl:  .  cholecalciferol (VITAMIN D) 1000 units tablet, Take 1,000 Units by mouth daily., Disp: , Rfl:  .  diazepam (VALIUM) 5 MG tablet, Take 1 tablet 30 minutes before MRI. Take 2nd tablet before MRI if needed., Disp: 2 tablet, Rfl: 0 .  estradiol (ESTRACE) 1 MG tablet, TAKE 1 TABLET BY MOUTH ONCE DAILY, Disp: 90 tablet, Rfl: 0 .  fluticasone (FLONASE) 50 MCG/ACT nasal spray, Place 2 sprays into both nostrils daily as needed for allergies or rhinitis. , Disp: , Rfl:  .  levothyroxine (SYNTHROID, LEVOTHROID) 75 MCG tablet, Take 1 tablet (75 mcg total) by mouth daily., Disp: 90 tablet, Rfl: 1 .  losartan (COZAAR) 50 MG tablet, Take 50 mg by mouth daily., Disp: , Rfl:  .  metoprolol tartrate (LOPRESSOR) 50 MG tablet, TAKE 1 TABLET BY MOUTH TWO  TIMES DAILY, Disp: 180 tablet, Rfl: 0 .  rOPINIRole (REQUIP) 0.5 MG tablet, TAKE 1 TABLET BY MOUTH AT  BEDTIME, Disp: 90 tablet, Rfl: 0 .  traMADol (ULTRAM) 50 MG tablet, Take 50 mg by mouth every 6 (six) hours as needed for moderate pain. , Disp: , Rfl:   Allergies  Allergen Reactions  . Lyrica [Pregabalin] Palpitations and Other (See Comments)    "thought I was going to have a heart attack; heart started pounding so hard, I couldn't catch my breath" (06/28/2012)  . Meclizine Other (See Comments)    "what was in my mind to say wasn't what came out to say; I was kind of in another world" (06/28/2012)  . Penicillins Itching and Rash    Has patient had a PCN reaction causing  immediate rash, facial/tongue/throat swelling, SOB or lightheadedness with hypotension: No Has patient had a PCN reaction causing severe rash involving mucus membranes or skin necrosis: No Has patient had a PCN reaction that required hospitalization No Has patient had a PCN reaction occurring within the last 10 years:   # # YES # #  If all of the above answers are "NO", then may proceed with Cephalosporin use.   . Poison Sumac Extract Swelling, Rash and Other (See Comments)    Blisters  . Bactrim [Sulfamethoxazole-Trimethoprim] Rash  . Codeine Nausea Only  . Morphine Sulfate Rash  . Propoxyphene N-Acetaminophen Nausea And Vomiting    "Darvocet"    Objective:   BP 102/62   Pulse (!) 55   Ht 5\' 3"  (1.6 m)   Wt 151 lb (68.5 kg)   BMI 26.75 kg/m   AAOx3, NAD NCAT, EOMI No obvious CN deficits Coloring WNL Pt is able to speak clearly, coherently without shortness of breath or increased work of breathing.  Thought process is linear.  Mood is appropriate.   Assessment and Plan:   Fatigue- new.  Pt has multiple possible reasons for fatigue- Afib, hypothyroid, hx of anemia, hypotension.  Will check labs to determine if there is metabolic cause.  Discussed possibility of COVID given fatigue, chills, body aches.  Will have pt return tomorrow to assess vitals.  Will STOP Losartan due to low BP.  Hypotension- new.  Pt's SBP typically runs 110s-120s.  A reading of 93 today w/ a HR in the 50s is very low for her.  She is already on 1/2 tab Metoprolol BID.  Will stop Losartan, increase fluids, and monitor BP.  If she feels worsening SOB, dizziness, or has other concerns advised her to go to ER.  Pt expressed understanding and is in agreement w/ plan.   Possible COVID- given pt's excessive fatigue, body aches, chills (no working thermometer), dry cough (which she thinks is allergies) there is the possibility for COVID.  Discussed this w/ pt who became upset bc she doesn't leave the house.  Agreed  that she is low risk, but it's impossible to eliminate risk.  Will have her monitor her sxs closely and notify me if anything changes or worsens.  Pt expressed understanding and is in agreement w/ plan.    Neena Rhymes, MD 10/28/2018

## 2018-10-29 ENCOUNTER — Other Ambulatory Visit: Payer: Self-pay

## 2018-10-29 ENCOUNTER — Other Ambulatory Visit (INDEPENDENT_AMBULATORY_CARE_PROVIDER_SITE_OTHER): Payer: Medicare Other

## 2018-10-29 DIAGNOSIS — R5383 Other fatigue: Secondary | ICD-10-CM | POA: Diagnosis not present

## 2018-10-29 LAB — CBC WITH DIFFERENTIAL/PLATELET
Basophils Absolute: 0 10*3/uL (ref 0.0–0.1)
Basophils Relative: 0.5 % (ref 0.0–3.0)
Eosinophils Absolute: 0.1 10*3/uL (ref 0.0–0.7)
Eosinophils Relative: 1.1 % (ref 0.0–5.0)
HCT: 41.8 % (ref 36.0–46.0)
Hemoglobin: 14.2 g/dL (ref 12.0–15.0)
Lymphocytes Relative: 33.6 % (ref 12.0–46.0)
Lymphs Abs: 1.8 10*3/uL (ref 0.7–4.0)
MCHC: 33.9 g/dL (ref 30.0–36.0)
MCV: 89 fl (ref 78.0–100.0)
Monocytes Absolute: 0.5 10*3/uL (ref 0.1–1.0)
Monocytes Relative: 9 % (ref 3.0–12.0)
Neutro Abs: 2.9 10*3/uL (ref 1.4–7.7)
Neutrophils Relative %: 55.8 % (ref 43.0–77.0)
Platelets: 196 10*3/uL (ref 150.0–400.0)
RBC: 4.7 Mil/uL (ref 3.87–5.11)
RDW: 13.9 % (ref 11.5–15.5)
WBC: 5.3 10*3/uL (ref 4.0–10.5)

## 2018-10-29 LAB — HEPATIC FUNCTION PANEL
ALT: 11 U/L (ref 0–35)
AST: 16 U/L (ref 0–37)
Albumin: 4.1 g/dL (ref 3.5–5.2)
Alkaline Phosphatase: 66 U/L (ref 39–117)
Bilirubin, Direct: 0.1 mg/dL (ref 0.0–0.3)
Total Bilirubin: 0.4 mg/dL (ref 0.2–1.2)
Total Protein: 6.1 g/dL (ref 6.0–8.3)

## 2018-10-29 LAB — BASIC METABOLIC PANEL
BUN: 22 mg/dL (ref 6–23)
CO2: 28 mEq/L (ref 19–32)
Calcium: 8.5 mg/dL (ref 8.4–10.5)
Chloride: 104 mEq/L (ref 96–112)
Creatinine, Ser: 0.87 mg/dL (ref 0.40–1.20)
GFR: 63.86 mL/min (ref >60.00)
Glucose, Bld: 79 mg/dL (ref 70–99)
Potassium: 4.3 mEq/L (ref 3.5–5.1)
Sodium: 140 mEq/L (ref 135–145)

## 2018-10-29 LAB — TSH: TSH: 0.65 u[IU]/mL (ref 0.35–4.50)

## 2018-10-29 MED ORDER — TRAMADOL HCL 50 MG PO TABS: ORAL_TABLET | ORAL | 0 refills | Status: DC

## 2018-11-01 ENCOUNTER — Encounter: Payer: Self-pay | Admitting: Orthopedic Surgery

## 2018-11-01 ENCOUNTER — Other Ambulatory Visit: Payer: Self-pay

## 2018-11-01 ENCOUNTER — Ambulatory Visit (INDEPENDENT_AMBULATORY_CARE_PROVIDER_SITE_OTHER): Payer: Medicare Other | Admitting: Orthopedic Surgery

## 2018-11-01 VITALS — Ht 63.0 in | Wt 151.0 lb

## 2018-11-01 DIAGNOSIS — M25571 Pain in right ankle and joints of right foot: Secondary | ICD-10-CM | POA: Diagnosis not present

## 2018-11-01 DIAGNOSIS — M958 Other specified acquired deformities of musculoskeletal system: Secondary | ICD-10-CM

## 2018-11-01 MED ORDER — METHYLPREDNISOLONE ACETATE 40 MG/ML IJ SUSP
40.0000 mg | INTRAMUSCULAR | Status: AC | PRN
  Administered 2018-11-01: 40 mg via INTRA_ARTICULAR

## 2018-11-01 MED ORDER — LIDOCAINE HCL 1 % IJ SOLN
2.0000 mL | INTRAMUSCULAR | Status: AC | PRN
  Administered 2018-11-01: 2 mL
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
## 2018-11-09 ENCOUNTER — Other Ambulatory Visit: Payer: Self-pay

## 2018-11-09 ENCOUNTER — Encounter (HOSPITAL_BASED_OUTPATIENT_CLINIC_OR_DEPARTMENT_OTHER): Payer: Self-pay | Admitting: *Deleted

## 2018-11-12 ENCOUNTER — Other Ambulatory Visit (HOSPITAL_COMMUNITY)
Admission: RE | Admit: 2018-11-12 | Discharge: 2018-11-12 | Disposition: A | Discharge status: Home / Self Care | Payer: Medicare Other | Source: Ambulatory Visit | Attending: Orthopedic Surgery | Admitting: Orthopedic Surgery

## 2018-11-12 DIAGNOSIS — Z1159 Encounter for screening for other viral diseases: Secondary | ICD-10-CM | POA: Insufficient documentation

## 2018-11-12 DIAGNOSIS — Z01812 Encounter for preprocedural laboratory examination: Secondary | ICD-10-CM | POA: Diagnosis not present

## 2018-11-13 LAB — NOVEL CORONAVIRUS, NAA (HOSP ORDER, SEND-OUT TO REF LAB; TAT 18-24 HRS): SARS-CoV-2, NAA: NOT DETECTED

## 2018-11-16 ENCOUNTER — Encounter (HOSPITAL_BASED_OUTPATIENT_CLINIC_OR_DEPARTMENT_OTHER)
Admission: RE | Disposition: A | Discharge status: Home / Self Care | Payer: Self-pay | Source: Home / Self Care | Attending: Orthopedic Surgery

## 2018-11-16 ENCOUNTER — Other Ambulatory Visit: Payer: Self-pay

## 2018-11-16 ENCOUNTER — Encounter (HOSPITAL_BASED_OUTPATIENT_CLINIC_OR_DEPARTMENT_OTHER): Payer: Self-pay | Admitting: *Deleted

## 2018-11-16 ENCOUNTER — Ambulatory Visit (HOSPITAL_BASED_OUTPATIENT_CLINIC_OR_DEPARTMENT_OTHER): Payer: Medicare Other | Admitting: Anesthesiology

## 2018-11-16 ENCOUNTER — Ambulatory Visit (HOSPITAL_BASED_OUTPATIENT_CLINIC_OR_DEPARTMENT_OTHER)
Admission: RE | Admit: 2018-11-16 | Discharge: 2018-11-16 | Disposition: A | Discharge status: Home / Self Care | Payer: Medicare Other | Attending: Orthopedic Surgery | Admitting: Orthopedic Surgery

## 2018-11-16 DIAGNOSIS — M93271 Osteochondritis dissecans, right ankle and joints of right foot: Secondary | ICD-10-CM | POA: Diagnosis not present

## 2018-11-16 DIAGNOSIS — E785 Hyperlipidemia, unspecified: Secondary | ICD-10-CM | POA: Diagnosis not present

## 2018-11-16 DIAGNOSIS — M12571 Traumatic arthropathy, right ankle and foot: Secondary | ICD-10-CM | POA: Diagnosis not present

## 2018-11-16 DIAGNOSIS — M659 Synovitis and tenosynovitis, unspecified: Secondary | ICD-10-CM | POA: Diagnosis not present

## 2018-11-16 DIAGNOSIS — J302 Other seasonal allergic rhinitis: Secondary | ICD-10-CM | POA: Insufficient documentation

## 2018-11-16 DIAGNOSIS — M19071 Primary osteoarthritis, right ankle and foot: Secondary | ICD-10-CM | POA: Insufficient documentation

## 2018-11-16 DIAGNOSIS — E039 Hypothyroidism, unspecified: Secondary | ICD-10-CM | POA: Diagnosis not present

## 2018-11-16 DIAGNOSIS — M25571 Pain in right ankle and joints of right foot: Secondary | ICD-10-CM | POA: Diagnosis present

## 2018-11-16 DIAGNOSIS — Z96653 Presence of artificial knee joint, bilateral: Secondary | ICD-10-CM | POA: Diagnosis not present

## 2018-11-16 DIAGNOSIS — Q6689 Other  specified congenital deformities of feet: Secondary | ICD-10-CM | POA: Diagnosis not present

## 2018-11-16 DIAGNOSIS — I10 Essential (primary) hypertension: Secondary | ICD-10-CM | POA: Diagnosis not present

## 2018-11-16 DIAGNOSIS — I499 Cardiac arrhythmia, unspecified: Secondary | ICD-10-CM | POA: Insufficient documentation

## 2018-11-16 HISTORY — PX: ANKLE ARTHROSCOPY: SHX545

## 2018-11-16 HISTORY — DX: Essential (primary) hypertension: I10

## 2018-11-16 SURGERY — ARTHROSCOPY, ANKLE
Anesthesia: General | Site: Ankle | Laterality: Right

## 2018-11-16 MED ORDER — OXYCODONE HCL 5 MG PO TABS
ORAL_TABLET | ORAL | Status: AC
  Filled 2018-11-16: qty 1

## 2018-11-16 MED ORDER — FENTANYL CITRATE (PF) 100 MCG/2ML IJ SOLN
INTRAMUSCULAR | Status: AC
  Filled 2018-11-16: qty 2

## 2018-11-16 MED ORDER — MIDAZOLAM HCL 2 MG/2ML IJ SOLN
INTRAMUSCULAR | Status: AC
  Filled 2018-11-16: qty 2

## 2018-11-16 MED ORDER — LACTATED RINGERS IV SOLN
INTRAVENOUS | Status: DC
  Administered 2018-11-16 (×2): via INTRAVENOUS

## 2018-11-16 MED ORDER — MIDAZOLAM HCL 2 MG/2ML IJ SOLN: 1.0000 mg | INTRAMUSCULAR | Status: DC | PRN

## 2018-11-16 MED ORDER — MEPERIDINE HCL 25 MG/ML IJ SOLN: 6.2500 mg | INTRAMUSCULAR | Status: DC | PRN

## 2018-11-16 MED ORDER — FENTANYL CITRATE (PF) 100 MCG/2ML IJ SOLN
25.0000 ug | INTRAMUSCULAR | Status: DC | PRN
  Administered 2018-11-16: 25 ug via INTRAVENOUS
  Administered 2018-11-16: 50 ug via INTRAVENOUS
  Administered 2018-11-16: 11:00:00 25 ug via INTRAVENOUS
  Administered 2018-11-16 (×2): 50 ug via INTRAVENOUS

## 2018-11-16 MED ORDER — DEXAMETHASONE SODIUM PHOSPHATE 10 MG/ML IJ SOLN
INTRAMUSCULAR | Status: AC
  Filled 2018-11-16: qty 1

## 2018-11-16 MED ORDER — BUPIVACAINE HCL (PF) 0.25 % IJ SOLN
INTRAMUSCULAR | Status: AC
  Filled 2018-11-16: qty 90

## 2018-11-16 MED ORDER — ACETAMINOPHEN 500 MG PO TABS
1000.0000 mg | ORAL_TABLET | Freq: Once | ORAL | Status: AC
  Administered 2018-11-16: 1000 mg via ORAL

## 2018-11-16 MED ORDER — FENTANYL CITRATE (PF) 100 MCG/2ML IJ SOLN: 50.0000 ug | INTRAMUSCULAR | Status: DC | PRN

## 2018-11-16 MED ORDER — DEXAMETHASONE SODIUM PHOSPHATE 10 MG/ML IJ SOLN
INTRAMUSCULAR | Status: DC | PRN
  Administered 2018-11-16: 4 mg via INTRAVENOUS

## 2018-11-16 MED ORDER — CLINDAMYCIN PHOSPHATE 900 MG/50ML IV SOLN
INTRAVENOUS | Status: AC
  Filled 2018-11-16: qty 50

## 2018-11-16 MED ORDER — SCOPOLAMINE 1 MG/3DAYS TD PT72
MEDICATED_PATCH | TRANSDERMAL | Status: AC
  Filled 2018-11-16: qty 1

## 2018-11-16 MED ORDER — PROMETHAZINE HCL 25 MG/ML IJ SOLN
6.2500 mg | INTRAMUSCULAR | Status: DC | PRN
  Administered 2018-11-16: 6.25 mg via INTRAVENOUS

## 2018-11-16 MED ORDER — PROPOFOL 10 MG/ML IV BOLUS
INTRAVENOUS | Status: AC
  Filled 2018-11-16: qty 20

## 2018-11-16 MED ORDER — PROPOFOL 10 MG/ML IV BOLUS
INTRAVENOUS | Status: DC | PRN
  Administered 2018-11-16: 150 mg via INTRAVENOUS

## 2018-11-16 MED ORDER — KETOROLAC TROMETHAMINE 15 MG/ML IJ SOLN
15.0000 mg | Freq: Once | INTRAMUSCULAR | Status: AC
  Administered 2018-11-16: 15 mg via INTRAVENOUS

## 2018-11-16 MED ORDER — LIDOCAINE 2% (20 MG/ML) 5 ML SYRINGE
INTRAMUSCULAR | Status: DC | PRN
  Administered 2018-11-16: 100 mg via INTRAVENOUS

## 2018-11-16 MED ORDER — SCOPOLAMINE 1 MG/3DAYS TD PT72: 1.0000 | MEDICATED_PATCH | Freq: Once | TRANSDERMAL | Status: DC | PRN

## 2018-11-16 MED ORDER — BUPIVACAINE HCL (PF) 0.25 % IJ SOLN
INTRAMUSCULAR | Status: DC | PRN
  Administered 2018-11-16: 20 mL

## 2018-11-16 MED ORDER — ONDANSETRON HCL 4 MG/2ML IJ SOLN
INTRAMUSCULAR | Status: DC | PRN
  Administered 2018-11-16: 4 mg via INTRAVENOUS

## 2018-11-16 MED ORDER — FENTANYL CITRATE (PF) 250 MCG/5ML IJ SOLN
INTRAMUSCULAR | Status: DC | PRN
  Administered 2018-11-16 (×2): 50 ug via INTRAVENOUS

## 2018-11-16 MED ORDER — ONDANSETRON HCL 4 MG/2ML IJ SOLN
INTRAMUSCULAR | Status: AC
  Filled 2018-11-16: qty 2

## 2018-11-16 MED ORDER — KETOROLAC TROMETHAMINE 15 MG/ML IJ SOLN
INTRAMUSCULAR | Status: AC
  Filled 2018-11-16: qty 1

## 2018-11-16 MED ORDER — PROMETHAZINE HCL 25 MG/ML IJ SOLN
INTRAMUSCULAR | Status: AC
  Filled 2018-11-16: qty 1

## 2018-11-16 MED ORDER — OXYCODONE-ACETAMINOPHEN 5-325 MG PO TABS: 1.0000 | ORAL_TABLET | ORAL | 0 refills | Status: DC | PRN

## 2018-11-16 MED ORDER — CHLORHEXIDINE GLUCONATE 4 % EX LIQD: 60.0000 mL | Freq: Once | CUTANEOUS | Status: DC

## 2018-11-16 MED ORDER — OXYCODONE HCL 5 MG PO TABS
5.0000 mg | ORAL_TABLET | Freq: Once | ORAL | Status: AC
  Administered 2018-11-16: 5 mg via ORAL

## 2018-11-16 MED ORDER — ACETAMINOPHEN 500 MG PO TABS
ORAL_TABLET | ORAL | Status: AC
  Filled 2018-11-16: qty 2

## 2018-11-16 MED ORDER — CLINDAMYCIN PHOSPHATE 900 MG/50ML IV SOLN
900.0000 mg | INTRAVENOUS | Status: AC
  Administered 2018-11-16: 900 mg via INTRAVENOUS

## 2018-11-16 SURGICAL SUPPLY — 37 items
BANDAGE ESMARK 6X9 LF (GAUZE/BANDAGES/DRESSINGS) IMPLANT
BNDG CMPR 9X6 STRL LF SNTH (GAUZE/BANDAGES/DRESSINGS)
BNDG COHESIVE 4X5 TAN STRL (GAUZE/BANDAGES/DRESSINGS) ×2 IMPLANT
BNDG COHESIVE 6X5 TAN STRL LF (GAUZE/BANDAGES/DRESSINGS) ×2 IMPLANT
BNDG ESMARK 6X9 LF (GAUZE/BANDAGES/DRESSINGS)
BURR OVAL 8 FLU 4.0X13 (MISCELLANEOUS) ×1 IMPLANT
COVER WAND RF STERILE (DRAPES) IMPLANT
DISSECTOR 4.0MM X 13CM (MISCELLANEOUS) ×2 IMPLANT
DRAPE ARTHROSCOPY W/POUCH 90 (DRAPES) ×2 IMPLANT
DRAPE OEC MINIVIEW 54X84 (DRAPES) IMPLANT
DRAPE U-SHAPE 47X51 STRL (DRAPES) ×2 IMPLANT
DRSG EMULSION OIL 3X3 NADH (GAUZE/BANDAGES/DRESSINGS) ×2 IMPLANT
DURAPREP 26ML APPLICATOR (WOUND CARE) ×2 IMPLANT
GAUZE SPONGE 4X4 12PLY STRL (GAUZE/BANDAGES/DRESSINGS) ×2 IMPLANT
GLOVE BIOGEL PI IND STRL 8.5 (GLOVE) ×1 IMPLANT
GLOVE BIOGEL PI INDICATOR 8.5 (GLOVE) ×1
GOWN STRL REUS W/ TWL LRG LVL3 (GOWN DISPOSABLE) ×1 IMPLANT
GOWN STRL REUS W/ TWL XL LVL3 (GOWN DISPOSABLE) ×1 IMPLANT
GOWN STRL REUS W/TWL LRG LVL3 (GOWN DISPOSABLE) ×2
GOWN STRL REUS W/TWL XL LVL3 (GOWN DISPOSABLE) ×2
KNEE WRAP E Z 3 GEL PACK (MISCELLANEOUS) IMPLANT
MANIFOLD NEPTUNE II (INSTRUMENTS) ×1 IMPLANT
NDL SAFETY ECLIPSE 18X1.5 (NEEDLE) ×1 IMPLANT
NEEDLE HYPO 18GX1.5 SHARP (NEEDLE) ×2
PACK ARTHROSCOPY DSU (CUSTOM PROCEDURE TRAY) ×2 IMPLANT
PACK BASIN DAY SURGERY FS (CUSTOM PROCEDURE TRAY) ×2 IMPLANT
PORT APPOLLO RF 90DEGREE MULTI (SURGICAL WAND) ×1 IMPLANT
STRAP ANKLE FOOT DISTRACTOR (ORTHOPEDIC SUPPLIES) IMPLANT
SUT ETHILON 4 0 PS 2 18 (SUTURE) ×2 IMPLANT
SUT MNCRL AB 3-0 PS2 18 (SUTURE) IMPLANT
SUT VIC AB 2-0 CT1 27 (SUTURE)
SUT VIC AB 2-0 CT1 TAPERPNT 27 (SUTURE) IMPLANT
SYR 5ML LL (SYRINGE) IMPLANT
TAPE STRIPS DRAPE STRL (GAUZE/BANDAGES/DRESSINGS) IMPLANT
TOWEL GREEN STERILE FF (TOWEL DISPOSABLE) ×2 IMPLANT
TUBING ARTHROSCOPY IRRIG 16FT (MISCELLANEOUS) ×2 IMPLANT
WATER STERILE IRR 1000ML POUR (IV SOLUTION) ×2 IMPLANT

## 2018-11-16 NOTE — Discharge Instructions (Signed)
°  Post Anesthesia Home Care Instructions  Activity: Get plenty of rest for the remainder of the day. A responsible individual must stay with you for 24 hours following the procedure.  For the next 24 hours, DO NOT: -Drive a car -Advertising copywriter -Drink alcoholic beverages -Take any medication unless instructed by your physician -Make any legal decisions or sign important papers.  Meals: Start with liquid foods such as gelatin or soup. Progress to regular foods as tolerated. Avoid greasy, spicy, heavy foods. If nausea and/or vomiting occur, drink only clear liquids until the nausea and/or vomiting subsides. Call your physician if vomiting continues.  Special Instructions/Symptoms: Your throat may feel dry or sore from the anesthesia or the breathing tube placed in your throat during surgery. If this causes discomfort, gargle with warm salt water. The discomfort should disappear within 24 hours.  If you had a scopolamine patch placed behind your ear for the management of post- operative nausea and/or vomiting:  1. The medication in the patch is effective for 72 hours, after which it should be removed.  Wrap patch in a tissue and discard in the trash. Wash hands thoroughly with soap and water. 2. You may remove the patch earlier than 72 hours if you experience unpleasant side effects which may include dry mouth, dizziness or visual disturbances. 3. Avoid touching the patch. Wash your hands with soap and water after contact with the patch.      Ice to operative site as needed  Keep dressing on and dry as instructed

## 2018-11-16 NOTE — Anesthesia Preprocedure Evaluation (Signed)
Anesthesia Evaluation  Patient identified by MRN, date of birth, ID band Patient awake    Reviewed: Allergy & Precautions, NPO status , Patient's Chart, lab work & pertinent test results  History of Anesthesia Complications (+) PONV  Airway Mallampati: II  TM Distance: >3 FB Neck ROM: Full    Dental no notable dental hx.    Pulmonary asthma ,    Pulmonary exam normal breath sounds clear to auscultation       Cardiovascular hypertension, Pt. on medications Normal cardiovascular exam+ dysrhythmias Atrial Fibrillation  Rhythm:Regular Rate:Normal     Neuro/Psych negative neurological ROS  negative psych ROS   GI/Hepatic negative GI ROS, Neg liver ROS, neg GERD  ,  Endo/Other  negative endocrine ROS  Renal/GU negative Renal ROS  negative genitourinary   Musculoskeletal negative musculoskeletal ROS (+)   Abdominal   Peds negative pediatric ROS (+)  Hematology negative hematology ROS (+)   Anesthesia Other Findings   Reproductive/Obstetrics negative OB ROS                             Anesthesia Physical Anesthesia Plan  ASA: II  Anesthesia Plan: General   Post-op Pain Management:    Induction: Intravenous  PONV Risk Score and Plan: 4 or greater and Ondansetron, Dexamethasone, Midazolam and Metaclopromide  Airway Management Planned: LMA  Additional Equipment:   Intra-op Plan:   Post-operative Plan: Extubation in OR  Informed Consent: I have reviewed the patients History and Physical, chart, labs and discussed the procedure including the risks, benefits and alternatives for the proposed anesthesia with the patient or authorized representative who has indicated his/her understanding and acceptance.     Dental advisory given  Plan Discussed with: CRNA  Anesthesia Plan Comments:         Anesthesia Quick Evaluation

## 2018-11-16 NOTE — Transfer of Care (Signed)
Immediate Anesthesia Transfer of Care Note  Patient: Melissa Jordan  Procedure(s) Performed: RIGHT ANKLE ARTHROSCOPY AND DEBRIDEMENT (Right Ankle)  Patient Location: PACU  Anesthesia Type:General  Level of Consciousness: awake, alert , oriented and patient cooperative  Airway & Oxygen Therapy: Patient Spontanous Breathing and Patient connected to face mask oxygen  Post-op Assessment: Report given to RN, Post -op Vital signs reviewed and stable and Patient moving all extremities  Post vital signs: Reviewed and stable  Last Vitals:  Vitals Value Taken Time  BP 155/88 11/16/2018  9:32 AM  Temp    Pulse 64 11/16/2018  9:34 AM  Resp 12 11/16/2018  9:34 AM  SpO2 100 % 11/16/2018  9:34 AM  Vitals shown include unvalidated device data.  Last Pain:  Vitals:   11/16/18 0727  TempSrc: Oral  PainSc: 5       Patients Stated Pain Goal: 5 (11/16/18 0727)  Complications: No apparent anesthesia complications

## 2018-11-16 NOTE — Op Note (Signed)
11/16/2018  9:38 AM  PATIENT:  Melissa Jordan    PRE-OPERATIVE DIAGNOSIS:  Painful Accessory Navicular Left Fo  POST-OPERATIVE DIAGNOSIS:  Same  PROCEDURE:  RIGHT ANKLE ARTHROSCOPY AND DEBRIDEMENT  SURGEON:  Nadara Mustard, MD  PHYSICIAN ASSISTANT:None ANESTHESIA:   General  PREOPERATIVE INDICATIONS:  [name] is a  73 y.o. female with a diagnosis of Painful Accessory Navicular Left Fo who failed conservative measures and elected for surgical management.    The risks benefits and alternatives were discussed with the patient preoperatively including but not limited to the risks of infection, bleeding, nerve injury, cardiopulmonary complications, the need for revision surgery, among others, and the patient was willing to proceed.  OPERATIVE IMPLANTS: None  @ENCIMAGES @  OPERATIVE FINDINGS: Large osteochondral defect of the medial talar dome involving almost the entire medial talar dome  OPERATIVE PROCEDURE: Patient was brought the operating room underwent a general anesthetic.  After adequate levels anesthesia were obtained patient's right lower extremity was prepped using DuraPrep draped into a sterile field a timeout was called.  The scope was inserted through the anterior medial portal and anterior lateral working portal was established.  The electrical wand was used to debride the synovitis.  Visualization of the articular surface showed a large flap tear of the medial talar dome the shaver was used to debride this back to healthy viable bleeding subchondral bone.  The grabber was used to remove a large fragment that was loose.  The medial lateral gutters were cleansed.  Synovectomy was completed.  Patient underwent extensive debridement.  After debridement the scope was removed the portals were closed using 4-0 nylon the joint was injected with a total of 20 cc of quarter percent Marcaine plain sterile dressing was applied patient was extubated taken the PACU in stable  condition.   DISCHARGE PLANNING:  Antibiotic duration: Preoperative clindamycin 900 mg  Weightbearing: Weightbearing as tolerated  Pain medication: Prescription for Percocet sent to pharmacy  Dressing care/ Wound VAC: Keep dressing intact until follow-up  Ambulatory devices: Postoperative shoe  Discharge to: Home.  Follow-up: In the office 1 week post operative.
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
## 2018-11-16 NOTE — Anesthesia Procedure Notes (Signed)
Procedure Name: LMA Insertion Date/Time: 11/16/2018 8:53 AM Performed by: Lucinda Dell, CRNA Pre-anesthesia Checklist: Patient identified, Suction available, Emergency Drugs available and Patient being monitored Patient Re-evaluated:Patient Re-evaluated prior to induction Oxygen Delivery Method: Circle system utilized Preoxygenation: Pre-oxygenation with 100% oxygen Induction Type: IV induction LMA: LMA inserted LMA Size: 4.0 Number of attempts: 1 Airway Equipment and Method: Stylet Placement Confirmation: ETT inserted through vocal cords under direct vision,  positive ETCO2 and breath sounds checked- equal and bilateral Tube secured with: Tape Dental Injury: Teeth and Oropharynx as per pre-operative assessment

## 2018-11-17 NOTE — Anesthesia Postprocedure Evaluation (Signed)
Anesthesia Post Note  Patient: Melissa Jordan  Procedure(s) Performed: RIGHT ANKLE ARTHROSCOPY AND DEBRIDEMENT (Right Ankle)     Patient location during evaluation: PACU Anesthesia Type: General Level of consciousness: awake and alert Pain management: pain level controlled Vital Signs Assessment: post-procedure vital signs reviewed and stable Respiratory status: spontaneous breathing, nonlabored ventilation, respiratory function stable and patient connected to nasal cannula oxygen Cardiovascular status: blood pressure returned to baseline and stable Postop Assessment: no apparent nausea or vomiting Anesthetic complications: no    Last Vitals:  Vitals:   11/16/18 1241 11/16/18 1249  BP: (!) 143/90   Pulse: 74   Resp: 18   Temp: 37 C 37 C  SpO2: 98%     Last Pain:  Vitals:   11/17/18 0921  TempSrc:   PainSc: 5                  Phillips Grout

## 2018-11-18 ENCOUNTER — Encounter (HOSPITAL_BASED_OUTPATIENT_CLINIC_OR_DEPARTMENT_OTHER): Payer: Self-pay | Admitting: Orthopedic Surgery

## 2018-11-18 ENCOUNTER — Other Ambulatory Visit: Payer: Self-pay | Admitting: Family Medicine

## 2018-11-22 DIAGNOSIS — M1990 Unspecified osteoarthritis, unspecified site: Secondary | ICD-10-CM | POA: Diagnosis not present

## 2018-11-22 DIAGNOSIS — I480 Paroxysmal atrial fibrillation: Secondary | ICD-10-CM | POA: Diagnosis not present

## 2018-11-22 DIAGNOSIS — E039 Hypothyroidism, unspecified: Secondary | ICD-10-CM | POA: Diagnosis not present

## 2018-11-22 DIAGNOSIS — E785 Hyperlipidemia, unspecified: Secondary | ICD-10-CM | POA: Diagnosis not present

## 2018-11-22 DIAGNOSIS — I10 Essential (primary) hypertension: Secondary | ICD-10-CM | POA: Diagnosis not present

## 2018-11-23 ENCOUNTER — Encounter: Payer: Self-pay | Admitting: Orthopedic Surgery

## 2018-11-23 ENCOUNTER — Ambulatory Visit (INDEPENDENT_AMBULATORY_CARE_PROVIDER_SITE_OTHER): Payer: Medicare Other | Admitting: Orthopedic Surgery

## 2018-11-23 ENCOUNTER — Other Ambulatory Visit: Payer: Self-pay

## 2018-11-23 VITALS — Ht 63.0 in | Wt 155.0 lb

## 2018-11-23 DIAGNOSIS — M12571 Traumatic arthropathy, right ankle and foot: Secondary | ICD-10-CM
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
## 2018-12-08 ENCOUNTER — Other Ambulatory Visit: Payer: Self-pay

## 2018-12-08 ENCOUNTER — Encounter: Payer: Self-pay | Admitting: Orthopedic Surgery

## 2018-12-08 ENCOUNTER — Ambulatory Visit (INDEPENDENT_AMBULATORY_CARE_PROVIDER_SITE_OTHER): Payer: Medicare Other | Admitting: Orthopedic Surgery

## 2018-12-08 VITALS — Ht 63.0 in | Wt 155.0 lb

## 2018-12-08 DIAGNOSIS — M12571 Traumatic arthropathy, right ankle and foot: Secondary | ICD-10-CM
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
## 2018-12-12 ENCOUNTER — Other Ambulatory Visit: Payer: Self-pay | Admitting: Family Medicine

## 2018-12-22 ENCOUNTER — Encounter: Payer: Self-pay | Admitting: Family

## 2018-12-22 ENCOUNTER — Ambulatory Visit: Payer: Medicare Other | Admitting: Orthopedic Surgery

## 2018-12-22 ENCOUNTER — Ambulatory Visit (INDEPENDENT_AMBULATORY_CARE_PROVIDER_SITE_OTHER): Payer: Medicare Other | Admitting: Family

## 2018-12-22 ENCOUNTER — Other Ambulatory Visit: Payer: Self-pay | Admitting: Family Medicine

## 2018-12-22 VITALS — Ht 63.0 in | Wt 155.0 lb

## 2018-12-22 DIAGNOSIS — M12571 Traumatic arthropathy, right ankle and foot: Secondary | ICD-10-CM

## 2018-12-22 DIAGNOSIS — M25571 Pain in right ankle and joints of right foot: Secondary | ICD-10-CM
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
## 2019-01-03 ENCOUNTER — Other Ambulatory Visit: Payer: Self-pay | Admitting: Cardiology

## 2019-01-14 ENCOUNTER — Ambulatory Visit
Admission: RE | Admit: 2019-01-14 | Discharge: 2019-01-14 | Disposition: A | Discharge status: Home / Self Care | Payer: Medicare Other | Source: Ambulatory Visit | Attending: Neurosurgery | Admitting: Neurosurgery

## 2019-01-14 ENCOUNTER — Other Ambulatory Visit: Payer: Self-pay

## 2019-01-14 DIAGNOSIS — M5416 Radiculopathy, lumbar region: Secondary | ICD-10-CM

## 2019-01-14 MED ORDER — METHYLPREDNISOLONE ACETATE 40 MG/ML INJ SUSP (RADIOLOG
120.0000 mg | Freq: Once | INTRAMUSCULAR | Status: AC
  Administered 2019-01-14: 120 mg via EPIDURAL

## 2019-01-14 MED ORDER — IOPAMIDOL (ISOVUE-M 200) INJECTION 41%
1.0000 mL | Freq: Once | INTRAMUSCULAR | Status: AC
  Administered 2019-01-14: 1 mL via EPIDURAL

## 2019-01-14 NOTE — Discharge Instructions (Signed)

## 2019-02-02 ENCOUNTER — Other Ambulatory Visit: Payer: Self-pay | Admitting: Family Medicine

## 2019-02-08 DIAGNOSIS — M48062 Spinal stenosis, lumbar region with neurogenic claudication: Secondary | ICD-10-CM | POA: Diagnosis not present

## 2019-02-08 DIAGNOSIS — S32009K Unspecified fracture of unspecified lumbar vertebra, subsequent encounter for fracture with nonunion: Secondary | ICD-10-CM | POA: Diagnosis not present

## 2019-02-09 ENCOUNTER — Other Ambulatory Visit: Payer: Self-pay | Admitting: Neurosurgery

## 2019-02-09 DIAGNOSIS — S32009K Unspecified fracture of unspecified lumbar vertebra, subsequent encounter for fracture with nonunion: Secondary | ICD-10-CM

## 2019-02-11 ENCOUNTER — Ambulatory Visit
Admission: RE | Admit: 2019-02-11 | Discharge: 2019-02-11 | Disposition: A | Discharge status: Home / Self Care | Payer: Medicare Other | Source: Ambulatory Visit | Attending: Neurosurgery | Admitting: Neurosurgery

## 2019-02-11 ENCOUNTER — Other Ambulatory Visit: Payer: Self-pay

## 2019-02-11 DIAGNOSIS — S32009K Unspecified fracture of unspecified lumbar vertebra, subsequent encounter for fracture with nonunion: Secondary | ICD-10-CM

## 2019-02-11 DIAGNOSIS — M47817 Spondylosis without myelopathy or radiculopathy, lumbosacral region: Secondary | ICD-10-CM | POA: Diagnosis not present

## 2019-02-11 MED ORDER — IOPAMIDOL (ISOVUE-M 200) INJECTION 41%
1.0000 mL | Freq: Once | INTRAMUSCULAR | Status: AC
  Administered 2019-02-11: 1 mL via INTRA_ARTICULAR

## 2019-02-11 MED ORDER — METHYLPREDNISOLONE ACETATE 40 MG/ML INJ SUSP (RADIOLOG
120.0000 mg | Freq: Once | INTRAMUSCULAR | Status: AC
  Administered 2019-02-11: 120 mg via INTRALESIONAL

## 2019-03-02 DIAGNOSIS — I10 Essential (primary) hypertension: Secondary | ICD-10-CM | POA: Diagnosis not present

## 2019-03-02 DIAGNOSIS — I480 Paroxysmal atrial fibrillation: Secondary | ICD-10-CM | POA: Diagnosis not present

## 2019-03-02 DIAGNOSIS — E039 Hypothyroidism, unspecified: Secondary | ICD-10-CM | POA: Diagnosis not present

## 2019-03-02 DIAGNOSIS — M1990 Unspecified osteoarthritis, unspecified site: Secondary | ICD-10-CM | POA: Diagnosis not present

## 2019-03-02 DIAGNOSIS — E785 Hyperlipidemia, unspecified: Secondary | ICD-10-CM | POA: Diagnosis not present

## 2019-03-03 ENCOUNTER — Other Ambulatory Visit: Payer: Self-pay | Admitting: Neurosurgery

## 2019-03-03 DIAGNOSIS — S32009K Unspecified fracture of unspecified lumbar vertebra, subsequent encounter for fracture with nonunion: Secondary | ICD-10-CM | POA: Diagnosis not present

## 2019-03-06 ENCOUNTER — Ambulatory Visit
Admission: EM | Admit: 2019-03-06 | Discharge: 2019-03-06 | Disposition: A | Discharge status: Home / Self Care | Payer: Medicare Other | Attending: Emergency Medicine | Admitting: Emergency Medicine

## 2019-03-06 ENCOUNTER — Encounter: Payer: Self-pay | Admitting: Emergency Medicine

## 2019-03-06 ENCOUNTER — Other Ambulatory Visit: Payer: Self-pay

## 2019-03-06 ENCOUNTER — Ambulatory Visit
Admission: EM | Admit: 2019-03-06 | Discharge: 2019-03-06 | Disposition: A | Discharge status: Still In Hospital | Payer: Medicare Other

## 2019-03-06 DIAGNOSIS — R21 Rash and other nonspecific skin eruption: Secondary | ICD-10-CM

## 2019-03-06 DIAGNOSIS — B880 Other acariasis: Secondary | ICD-10-CM

## 2019-03-06 MED ORDER — HYDROXYZINE HCL 25 MG PO TABS: 25.0000 mg | ORAL_TABLET | Freq: Four times a day (QID) | ORAL | 0 refills | Status: DC

## 2019-03-06 MED ORDER — PREDNISONE 20 MG PO TABS: 20.0000 mg | ORAL_TABLET | Freq: Every day | ORAL | 0 refills | Status: AC

## 2019-03-06 MED ORDER — METHYLPREDNISOLONE SODIUM SUCC 125 MG IJ SOLR
125.0000 mg | Freq: Once | INTRAMUSCULAR | Status: AC
  Administered 2019-03-06: 125 mg via INTRAMUSCULAR
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
## 2019-03-06 NOTE — Discharge Instructions (Signed)
Take prednisone every morning with breakfast for the next 3 days. Take hydroxyzine up to every 6 hours as needed for severe itching: Important to not drink/drive, take routine pain medications to avoid somnolence and reduce fall risk. Return for worsening pain, fever.

## 2019-03-06 NOTE — ED Triage Notes (Signed)
Per pt she was in her garden Tuesday and that night noticed a bump (chigger)she had places in her groin area that were itching and have spread to her under arms, stomach, back and spreading, very itchy.

## 2019-03-07 ENCOUNTER — Telehealth: Payer: Self-pay | Admitting: Emergency Medicine

## 2019-03-07 NOTE — Telephone Encounter (Signed)
Checked in on patient, discussed medications, and encouraged return call with any continuing questions or concerns.   Patient states "I am feeling so much better!"

## 2019-03-08 ENCOUNTER — Ambulatory Visit (INDEPENDENT_AMBULATORY_CARE_PROVIDER_SITE_OTHER): Payer: Medicare Other | Admitting: Family Medicine

## 2019-03-08 ENCOUNTER — Other Ambulatory Visit: Payer: Self-pay

## 2019-03-08 ENCOUNTER — Encounter: Payer: Self-pay | Admitting: Family Medicine

## 2019-03-08 VITALS — BP 136/68 | HR 54 | Temp 97.6°F | Ht 63.0 in | Wt 153.0 lb

## 2019-03-08 DIAGNOSIS — H109 Unspecified conjunctivitis: Secondary | ICD-10-CM

## 2019-03-08 MED ORDER — BACITRACIN-POLYMYXIN B 500-10000 UNIT/GM OP OINT: 1.0000 "application " | TOPICAL_OINTMENT | Freq: Three times a day (TID) | OPHTHALMIC | 0 refills | Status: DC

## 2019-03-08 NOTE — Progress Notes (Signed)
Virtual Visit via Video   I connected with patient on 03/08/19 at 10:45 AM EDT by a video enabled telemedicine application and verified that I am speaking with the correct person using two identifiers.  Location patient: [facility] provider[facility] Persons participating in the virtual visit: Patient, Provider, CMA (Jess B)  I discussed the limitations of evaluation and management by telemedicine and the availability of in person appointments. The patient expressed understanding and agreed to proceed.  Subjective:   HPI:  Pink eye- pt reports R eye was 'a little pink' yesterday but today is 'very red, swollen, and I have drainage'.  'cloudy drainage'.  Denies pain but 'I know it's there'.  No fevers.  + seasonal allergies.  No recent rubbing or injury to eye.  + HA, sinus pressure.  Using Flonase daily.  Has added Claritin periodically.  ROS:   See pertinent positives and negatives per HPI.  Patient Active Problem List   Diagnosis Date Noted  . Traumatic arthritis of right ankle   . Pain in right ankle and joints of right foot 08/05/2018  . A-fib (HCC) 03/10/2017  . 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 RLS (restless legs syndrome) 01/11/2015  . Osteopenia 05/15/2014  . Hyperlipidemia 05/24/2013  . Routine general medical examination at a health care facility 03/29/2012  . POSTMENOPAUSAL SYNDROME 09/26/2009  . URINARY URGENCY 09/26/2009  . ANEMIA 01/09/2009  . Back pain of lumbar region with sciatica 01/09/2009  . LAMINECTOMY, LUMBAR, HX OF 08/09/2008  . Hypothyroid 03/03/2007  . HYPERTENSION, BENIGN 03/03/2007  . GERD 02/10/2007    Social History   Tobacco Use  . Smoking status: Never Smoker  . Smokeless tobacco: Never Used  Substance Use Topics  . Alcohol use: No    Current Outpatient Medications:  .  acetaminophen (TYLENOL) 500 MG tablet, Take 1,000 mg by mouth 2 (two) times daily as needed. For pain, Disp: , Rfl:  .  amiodarone  (PACERONE) 200 MG tablet, Take 0.5 tablets (100 mg total) by mouth daily., Disp: 30 tablet, Rfl: 3 .  apixaban (ELIQUIS) 5 MG TABS tablet, Take 1 tablet (5 mg total) by mouth 2 (two) times daily., Disp: 60 tablet, Rfl: 0 .  aspirin EC 81 MG tablet, Take 1 tablet (81 mg total) by mouth daily., Disp: 30 tablet, Rfl: 3 .  Biotin 5000 MCG CAPS, Take 5,000 mcg by mouth daily., Disp: , Rfl:  .  CALCIUM-VITAMIN D PO, Take 1 tablet by mouth daily. , Disp: , Rfl:  .  cholecalciferol (VITAMIN D) 1000 units tablet, Take 1,000 Units by mouth daily., Disp: , Rfl:  .  estradiol (ESTRACE) 1 MG tablet, Take 1 tablet by mouth once daily, Disp: 90 tablet, Rfl: 0 .  fluticasone (FLONASE) 50 MCG/ACT nasal spray, Place 2 sprays into both nostrils daily as needed for allergies or rhinitis. , Disp: , Rfl:  .  hydrOXYzine (ATARAX/VISTARIL) 25 MG tablet, Take 1 tablet (25 mg total) by mouth every 6 (six) hours., Disp: 12 tablet, Rfl: 0 .  levothyroxine (SYNTHROID) 75 MCG tablet, TAKE 1 TABLET BY MOUTH  DAILY, Disp: 90 tablet, Rfl: 1 .  losartan (COZAAR) 50 MG tablet, Take 25 mg by mouth daily. , Disp: , Rfl:  .  metoprolol tartrate (LOPRESSOR) 50 MG tablet, TAKE 1 TABLET BY MOUTH TWO  TIMES DAILY (Patient taking differently: 25 mg. ), Disp: 180 tablet, Rfl: 0 .  predniSONE (DELTASONE) 20 MG tablet, Take 1 tablet (20 mg total) by  mouth daily for 3 days., Disp: 3 tablet, Rfl: 0 .  rOPINIRole (REQUIP) 0.5 MG tablet, TAKE 1 TABLET BY MOUTH AT  BEDTIME, Disp: 90 tablet, Rfl: 0 .  traMADol (ULTRAM) 50 MG tablet, 1 po q 8-12 hrs prn pain, Disp: 30 tablet, Rfl: 0  Allergies  Allergen Reactions  . Lyrica [Pregabalin] Palpitations and Other (See Comments)    "thought I was going to have a heart attack; heart started pounding so hard, I couldn't catch my breath" (06/28/2012)  . Meclizine Other (See Comments)    "what was in my mind to say wasn't what came out to say; I was kind of in another world" (06/28/2012)  . Penicillins Itching  and Rash    Has patient had a PCN reaction causing immediate rash, facial/tongue/throat swelling, SOB or lightheadedness with hypotension: No Has patient had a PCN reaction causing severe rash involving mucus membranes or skin necrosis: No Has patient had a PCN reaction that required hospitalization No Has patient had a PCN reaction occurring within the last 10 years:   # # YES # #  If all of the above answers are "NO", then may proceed with Cephalosporin use.   . Poison Sumac Extract Swelling, Rash and Other (See Comments)    Blisters  . Bactrim [Sulfamethoxazole-Trimethoprim] Rash  . Codeine Nausea Only  . Morphine Sulfate Rash  . Propoxyphene N-Acetaminophen Nausea And Vomiting    "Darvocet"    Objective:   BP 136/68   Pulse (!) 54   Temp 97.6 F (36.4 C) (Tympanic)   Ht 5\' 3"  (1.6 m)   Wt 153 lb (69.4 kg)   BMI 27.10 kg/m  AAOx3, NAD NCAT, EOMI R eye injected No obvious CN deficits Coloring WNL Pt is able to speak clearly, coherently without shortness of breath or increased work of breathing.  Thought process is linear.  Mood is appropriate.   Assessment and Plan:   Conjunctivitis- new.  Start Polysporin as pt is allergic to Bactrim and can't use Polytrim drops.  Reviewed supportive care and red flags that should prompt return.  Pt expressed understanding and is in agreement w/ plan.    Annye Asa, MD 03/08/2019

## 2019-03-08 NOTE — Progress Notes (Signed)
I have discussed the procedure for the virtual visit with the patient who has given consent to proceed with assessment and treatment.   Jessica L Brodmerkel, CMA     

## 2019-03-14 DIAGNOSIS — G8911 Acute pain due to trauma: Secondary | ICD-10-CM | POA: Diagnosis not present

## 2019-03-14 DIAGNOSIS — M25562 Pain in left knee: Secondary | ICD-10-CM | POA: Diagnosis not present

## 2019-03-14 DIAGNOSIS — Z96652 Presence of left artificial knee joint: Secondary | ICD-10-CM | POA: Diagnosis not present

## 2019-03-14 DIAGNOSIS — M25561 Pain in right knee: Secondary | ICD-10-CM | POA: Diagnosis not present

## 2019-03-14 DIAGNOSIS — Z96651 Presence of right artificial knee joint: Secondary | ICD-10-CM | POA: Diagnosis not present

## 2019-03-16 ENCOUNTER — Other Ambulatory Visit: Payer: Self-pay | Admitting: Family Medicine

## 2019-03-18 DIAGNOSIS — L209 Atopic dermatitis, unspecified: Secondary | ICD-10-CM | POA: Diagnosis not present

## 2019-03-23 ENCOUNTER — Ambulatory Visit (INDEPENDENT_AMBULATORY_CARE_PROVIDER_SITE_OTHER): Payer: Medicare Other | Admitting: Family Medicine

## 2019-03-23 ENCOUNTER — Encounter: Payer: Self-pay | Admitting: Family Medicine

## 2019-03-23 ENCOUNTER — Other Ambulatory Visit: Payer: Self-pay

## 2019-03-23 VITALS — BP 126/80 | HR 70 | Temp 98.0°F | Resp 16 | Ht 63.0 in | Wt 157.0 lb

## 2019-03-23 DIAGNOSIS — S7001XA Contusion of right hip, initial encounter: Secondary | ICD-10-CM | POA: Diagnosis not present

## 2019-03-23 NOTE — Progress Notes (Signed)
   Subjective:    Patient ID: Melissa Jordan, female    DOB: 05-May-1946, 73 y.o.   MRN: [id number]  HPI Pt stepped on drier vent, lost her balance, 'overcorrected', skinned R arm against the cinder block wall.  Doesn't remember hitting hip, 'but I must have'.  Woke at 3am unable to move R leg.  Had 'a bruise the size of my hand'.  Put ice on it.  'it kept getting bigger and bigger'.  'it hurts and it burns'.  Pt is on Eliquis and ASA.  Pt is having quite a bit of pain, unable to walk or sit comfortably, R thigh is numb 'like your foot when it's asleep', and pt feels area continues to enlarge.   Review of Systems For ROS see HPI     Objective:   Physical Exam Vitals signs reviewed.  Constitutional:      Appearance: She is not ill-appearing.     Comments: Tearful, obviously uncomfortable  HENT:     Head: Normocephalic and atraumatic.  Skin:    Findings: Bruising (enlarging hematoma of R hip and glute.  very taught, extremely TTP, large enough to cause visible deformity) present.     Comments: Superficial bruising and skin tears on R arm  Neurological:     General: No focal deficit present.     Mental Status: She is alert and oriented to person, place, and time.  Psychiatric:     Comments: Tearful, anxious           Assessment & Plan:  Hematoma of R hip- pt is on both Eliquis and ASA and is not able to stop anticoagulation at this time.  She has numbness and burning of R thigh/glute.  Given that pt feels area continues to enlarge, will place stat referral to vascular in case area needs to be drained.  Pt to continue to ice.  If bleeding prior to vascular appt or continued enlargement, pt to go to ER.  Pt expressed understanding and is in agreement w/ plan.

## 2019-03-23 NOTE — Patient Instructions (Signed)
Follow up as needed or as scheduled We placed a stat referral to Vascular and they say they will call you after the nurse gets back from lunch ICE!!! If it continues to enlarge between now and your vascular appt or you develop spontaneous bleeding, please go to [place] in there!!!

## 2019-03-24 ENCOUNTER — Ambulatory Visit: Payer: Medicare Other | Admitting: Vascular Surgery

## 2019-03-24 ENCOUNTER — Encounter: Payer: Self-pay | Admitting: Vascular Surgery

## 2019-03-24 VITALS — BP 100/67 | Temp 97.8°F | Resp 20 | Ht 63.0 in | Wt 158.0 lb

## 2019-03-24 DIAGNOSIS — S7001XA Contusion of right hip, initial encounter: Secondary | ICD-10-CM | POA: Diagnosis not present
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
## 2019-03-25 DIAGNOSIS — Z96651 Presence of right artificial knee joint: Secondary | ICD-10-CM | POA: Diagnosis not present

## 2019-03-29 ENCOUNTER — Encounter: Payer: Medicare Other | Admitting: Family Medicine

## 2019-03-31 ENCOUNTER — Encounter: Payer: Self-pay | Admitting: Vascular Surgery

## 2019-03-31 ENCOUNTER — Other Ambulatory Visit: Payer: Self-pay

## 2019-03-31 ENCOUNTER — Ambulatory Visit (INDEPENDENT_AMBULATORY_CARE_PROVIDER_SITE_OTHER): Payer: Medicare Other | Admitting: Vascular Surgery

## 2019-03-31 VITALS — BP 122/69 | HR 70 | Temp 97.4°F | Resp 20 | Ht 63.0 in | Wt 158.0 lb

## 2019-03-31 DIAGNOSIS — S7001XD Contusion of right hip, subsequent encounter: Secondary | ICD-10-CM

## 2019-03-31 NOTE — Progress Notes (Signed)
Patient is a 73 year old female who returns for follow-up today.  She had a hematoma in her right hip after a fall while on anticoagulants.  She states she is still having pain in the right hip.  However, it is a little bit better than a week ago.  The hematoma has tracked down to her mid calf at this point.  She remains off all anticoagulation.  She has had no evidence of any embolic events.  She was previously on Eliquis for paroxysmal atrial fibrillation.  She is continuing to apply ice to the hematoma.  Review of systems: She has no fever or chills.  She is able to ambulate a little better than last week.  Physical exam:  Vitals:   03/31/19 0828  BP: 122/69  Pulse: 70  Resp: 20  Temp: (!) 97.4 F (36.3 C)  SpO2: 98%  Weight: 158 lb (71.7 kg)  Height: 5\' 3"  (1.6 m)    Extremities: Hematoma right hip approximately 9 x 7 cm softer than 1 week ago still tender to palpation ecchymosis tracking over the right hip posterior thigh and to mid right posterior calf no evidence of increasing hematoma  Assessment: Slowly resolving right hip hematoma after ground-level fall while on Eliquis.  Plan: Continue to remain off anticoagulation.  She will continue to apply ice and take Tylenol for pain.  She will follow-up in 2 weeks time.  Ruta Hinds, MD [facility] [place] Office: [phone] Pager: [phone]

## 2019-04-05 ENCOUNTER — Ambulatory Visit (INDEPENDENT_AMBULATORY_CARE_PROVIDER_SITE_OTHER): Payer: Medicare Other | Admitting: Family Medicine

## 2019-04-05 ENCOUNTER — Encounter: Payer: Self-pay | Admitting: Family Medicine

## 2019-04-05 ENCOUNTER — Other Ambulatory Visit: Payer: Self-pay

## 2019-04-05 VITALS — BP 120/79 | HR 59 | Temp 97.8°F | Resp 16 | Ht 63.0 in | Wt 156.2 lb

## 2019-04-05 DIAGNOSIS — E039 Hypothyroidism, unspecified: Secondary | ICD-10-CM | POA: Diagnosis not present

## 2019-04-05 DIAGNOSIS — I4891 Unspecified atrial fibrillation: Secondary | ICD-10-CM

## 2019-04-05 DIAGNOSIS — E785 Hyperlipidemia, unspecified: Secondary | ICD-10-CM

## 2019-04-05 DIAGNOSIS — M8580 Other specified disorders of bone density and structure, unspecified site: Secondary | ICD-10-CM

## 2019-04-05 DIAGNOSIS — Z0000 Encounter for general adult medical examination without abnormal findings: Secondary | ICD-10-CM | POA: Diagnosis not present

## 2019-04-05 DIAGNOSIS — I10 Essential (primary) hypertension: Secondary | ICD-10-CM | POA: Diagnosis not present

## 2019-04-05 DIAGNOSIS — Z23 Encounter for immunization: Secondary | ICD-10-CM

## 2019-04-05 LAB — CBC WITH DIFFERENTIAL/PLATELET
Basophils Absolute: 0 10*3/uL (ref 0.0–0.1)
Basophils Relative: 0.2 % (ref 0.0–3.0)
Eosinophils Absolute: 0.1 10*3/uL (ref 0.0–0.7)
Eosinophils Relative: 0.9 % (ref 0.0–5.0)
HCT: 36 % (ref 36.0–46.0)
Hemoglobin: 11.7 g/dL — ABNORMAL LOW (ref 12.0–15.0)
Lymphocytes Relative: 12.9 % (ref 12.0–46.0)
Lymphs Abs: 1.7 10*3/uL (ref 0.7–4.0)
MCHC: 32.7 g/dL (ref 30.0–36.0)
MCV: 93.1 fl (ref 78.0–100.0)
Monocytes Absolute: 0.9 10*3/uL (ref 0.1–1.0)
Monocytes Relative: 6.7 % (ref 3.0–12.0)
Neutro Abs: 10.4 10*3/uL — ABNORMAL HIGH (ref 1.4–7.7)
Neutrophils Relative %: 79.3 % — ABNORMAL HIGH (ref 43.0–77.0)
Platelets: 252 10*3/uL (ref 150.0–400.0)
RBC: 3.86 Mil/uL — ABNORMAL LOW (ref 3.87–5.11)
RDW: 15.2 % (ref 11.5–15.5)
WBC: 13.1 10*3/uL — ABNORMAL HIGH (ref 4.0–10.5)

## 2019-04-05 LAB — HEPATIC FUNCTION PANEL
ALT: 11 U/L (ref 0–35)
AST: 13 U/L (ref 0–37)
Albumin: 4 g/dL (ref 3.5–5.2)
Alkaline Phosphatase: 79 U/L (ref 39–117)
Bilirubin, Direct: 0.1 mg/dL (ref 0.0–0.3)
Total Bilirubin: 0.8 mg/dL (ref 0.2–1.2)
Total Protein: 5.8 g/dL — ABNORMAL LOW (ref 6.0–8.3)

## 2019-04-05 LAB — LIPID PANEL
Cholesterol: 196 mg/dL (ref 0–200)
HDL: 61 mg/dL (ref >39.00)
LDL Cholesterol: 108 mg/dL — ABNORMAL HIGH (ref 0–99)
NonHDL: 135.49
Total CHOL/HDL Ratio: 3
Triglycerides: 136 mg/dL (ref 0.0–149.0)
VLDL: 27.2 mg/dL (ref 0.0–40.0)

## 2019-04-05 LAB — BASIC METABOLIC PANEL
BUN: 20 mg/dL (ref 6–23)
CO2: 29 mEq/L (ref 19–32)
Calcium: 8.7 mg/dL (ref 8.4–10.5)
Chloride: 105 mEq/L (ref 96–112)
Creatinine, Ser: 0.69 mg/dL (ref 0.40–1.20)
GFR: 83.35 mL/min (ref >60.00)
Glucose, Bld: 82 mg/dL (ref 70–99)
Potassium: 4.1 mEq/L (ref 3.5–5.1)
Sodium: 139 mEq/L (ref 135–145)

## 2019-04-05 LAB — TSH: TSH: 1.11 u[IU]/mL (ref 0.35–4.50)

## 2019-04-05 LAB — VITAMIN D 25 HYDROXY (VIT D DEFICIENCY, FRACTURES): VITD: 46.21 ng/mL (ref 30.00–100.00)

## 2019-04-05 MED ORDER — ROPINIROLE HCL 1 MG PO TABS: 1.0000 mg | ORAL_TABLET | Freq: Every day | ORAL | 1 refills | Status: DC

## 2019-04-05 NOTE — Assessment & Plan Note (Signed)
Chronic problem.  Currently asymptomatic.  Check labs.  Adjust meds prn  

## 2019-04-05 NOTE — Assessment & Plan Note (Signed)
Chronic problem.  Well controlled.  Currently asymptomatic.  Will follow. 

## 2019-04-05 NOTE — Progress Notes (Signed)
   Subjective:    Patient ID: Melissa Jordan, female    DOB: 21-May-1946, 73 y.o.   MRN: [id number]  HPI Here today for MWV and CPE.  Risk Factors: Afib- chronic problem, following w/ Cards.  On Amiodarone, metoprolol.  Currently off Eliquis and ASA due to hematoma HTN- chronic problem, on Losartan 50mg , Metoprolol 50mg  daily Hypothyroid- chronic problem, on levothyroxine 76mcg daily Hyperlipidemia- chronic problem, has been controlling w/ healthy diet and regular exercise Physical Activity: was exercising prior to R leg injury Fall Risk: low Depression: denies depression Hearing: normal to conversational tones, mildly decreased to whispered voice ADL's: independent Cognitive: normal linear thought process, memory and attention intact Home Safety: safe at home, lives w/ husband Height, Weight, BMI, Visual Acuity: see vitals, vision corrected to 20/20 w/ glasses Counseling: UTD on colonoscopy, due for flu and Prevnar.  Due for mammogram (pt plans to schedule) Labs Ordered: See A&P Care Plan: See A&P   Patient Care Team    Relationship Specialty Notifications Start End  Midge Minium, MD PCP - General Family Medicine  03/30/12    Comment: Ulysees Barns, MD Attending Physician Neurosurgery  05/15/14   Vickey Huger, MD Consulting Physician Orthopedic Surgery  07/30/16   Galvin Proffer, OD  Optometry  07/30/16   Levin Erp  Dentistry  07/30/16   Charolette Forward, MD Consulting Physician Cardiology  03/10/17     Review of Systems Patient reports no vision/ hearing changes, adenopathy,fever, weight change,  persistant/recurrent hoarseness , swallowing issues, chest pain, palpitations, edema, persistant/recurrent cough, hemoptysis, dyspnea (rest/exertional/paroxysmal nocturnal), gastrointestinal bleeding (melena, rectal bleeding), abdominal pain, significant heartburn, bowel changes, GU symptoms (dysuria, hematuria, incontinence), Gyn symptoms (abnormal  bleeding, pain),   syncope, focal weakness, memory loss, numbness & tingling, skin/hair/nail changes, anxiety, or depression.   + RLS    Objective:   Physical Exam General Appearance:    Alert, cooperative, no distress, appears stated age  Head:    Normocephalic, without obvious abnormality, atraumatic  Eyes:    PERRL, conjunctiva/corneas clear, EOM's intact, fundi    benign, both eyes  Ears:    Normal TM's and external ear canals, both ears  Nose:   Deferred due to COVID  Throat:   Neck:   Supple, symmetrical, trachea midline, no adenopathy;    Thyroid: no enlargement/tenderness/nodules  Back:     Symmetric, no curvature, ROM normal, no CVA tenderness  Lungs:     Clear to auscultation bilaterally, respirations unlabored  Chest Wall:    No tenderness or deformity   Heart:    Regular rate and rhythm, S1 and S2 normal, no murmur, rub   or gallop  Breast Exam:    Deferred to mammo  Abdomen:     Soft, non-tender, bowel sounds active all four quadrants,    no masses, no organomegaly  Genitalia:    Deferred  Rectal:    Extremities:   Large hematoma of R leg  Pulses:   2+ and symmetric all extremities  Skin:   Skin color, texture, turgor normal, no rashes or lesions  Lymph nodes:   Cervical, supraclavicular, and axillary nodes normal  Neurologic:   CNII-XII intact, normal strength, sensation and reflexes    throughout          Assessment & Plan:

## 2019-04-05 NOTE — Assessment & Plan Note (Signed)
Chronic problem.  On Amiodarone and Metoprolol.  Eliquis recently stopped due to large hematoma.  Pt is asking to change Cardiology offices as she feels she needs more communication.  Referral placed.

## 2019-04-05 NOTE — Patient Instructions (Addendum)
Follow up in 6 months to recheck BP and cholesterol We'll notify you of your lab results and make any changes if needed Call and schedule your mammogram at your convenience Increase the Requip to 1mg  nightly- 2 of what you have at home and 1 of the new prescription We'll call you with your cardiology appt Call with any questions or concerns Stay Safe!!!

## 2019-04-05 NOTE — Assessment & Plan Note (Signed)
Check Vit D level and replete prn. 

## 2019-04-05 NOTE — Assessment & Plan Note (Signed)
Pt has been able to control w/ diet and exercise.  Check labs and determine if medication is needed. 

## 2019-04-05 NOTE — Assessment & Plan Note (Signed)
Pt's PE WNL w/ exception of large R leg hematoma.  UTD on colonoscopy.  Flu shot given today.  Pt to scheduled mammo.  Check labs.  Anticipatory guidance provided.

## 2019-04-14 ENCOUNTER — Ambulatory Visit (INDEPENDENT_AMBULATORY_CARE_PROVIDER_SITE_OTHER): Payer: Medicare Other | Admitting: Vascular Surgery

## 2019-04-14 ENCOUNTER — Encounter: Payer: Self-pay | Admitting: Vascular Surgery

## 2019-04-14 ENCOUNTER — Other Ambulatory Visit: Payer: Self-pay

## 2019-04-14 VITALS — BP 122/72 | HR 75 | Temp 97.7°F | Resp 20 | Ht 63.0 in | Wt 156.0 lb

## 2019-04-14 DIAGNOSIS — S7001XD Contusion of right hip, subsequent encounter: Secondary | ICD-10-CM

## 2019-04-14 NOTE — Progress Notes (Signed)
Patient is a 73 year old female who returns for follow-up today.  She was initially seen October 1 for hematoma over her right buttocks region while on anticoagulation.  She has now been off of her anticoagulation for the last 2 weeks.  She still has some soreness over the right hip and swelling.  This has improved a little bit.  She is able to ambulate.  Her problems mainly occur when she tries to sit on this area or rolls over on it in her sleep.  She is still using warm showers and ice with some improvement.  She apparently is scheduled to have a back operation in a few weeks.  I told her she should touch base with Dr. Windy Carina office regarding the hematoma in her hip.  Past Medical History:  Diagnosis Date  . Allergy   . Anemia    after last surgery in 2016  . Arrhythmia    takes Metoprolol daily  . Asthma   . Bruises easily   . Chronic lower back pain   . Complication of anesthesia    "BP bottoms out after OR" (06/28/2012)  . Dysrhythmia 2018   PAF  . GERD (gastroesophageal reflux disease)    "one time; really I think it was all due to drinking aspartame" (06/28/2012)  . History of bronchitis    "when I get a bad cold; not chronic; I've had it a few times" (06/28/2012)  . History of stress test    30 yrs. ago- wnl  . Hypertension   . Joint pain   . Joint swelling   . Neuromuscular disorder ([place])    back related   . Osteoarthritis    back, knees  . Osteopenia   . Pneumonia    "couple times in the winters" (06/28/2012), hosp. 2002  . PONV (postoperative nausea and vomiting)    SUPER NAUSEATED  . Seasonal allergies    takes Claritin daily  . Urinary urgency      Physical exam:  Vitals:   04/14/19 1448  BP: 122/72  Pulse: 75  Resp: 20  Temp: 97.7 F (36.5 C)  SpO2: 98%  Weight: 156 lb (70.8 kg)  Height: 5\' 3"  (1.6 m)    Right hip still with a 10 x 8 palpable hematoma although this is softer than when I initially evaluated her October 1 there is still diffuse ecchymosis  which has extended down her posterior thigh and calf  Assessment: Slowly resolving hematoma right hip which is slowly improving.  I do not believe it needs surgical decompression at this point.  She has no skin compromise.  Pain is slowly improving.  Plan: I would keep her off her anticoagulation until the hematoma has completely resolved.  She apparently is going to see a new cardiologist in the future and it could be revisited whether or not she needs long-term anticoagulation.  I would not say that her hematoma is a complete contraindication to further anticoagulation.  Probably the risk of paroxysmal atrial fibrillation is higher than the risk of recurring hematoma.  I will leave this at the discretion of Dr. Birdie Riddle and her cardiologist.  The patient will follow-up with me on an as-needed basis if her symptoms worsen over time.  If her symptoms continue to resolve she would not need to be seen in the future.  Ruta Hinds, MD [facility] Office: [phone]
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
## 2019-04-27 ENCOUNTER — Telehealth: Payer: Self-pay | Admitting: Cardiology

## 2019-04-27 ENCOUNTER — Other Ambulatory Visit: Payer: Self-pay

## 2019-04-27 ENCOUNTER — Encounter: Payer: Self-pay | Admitting: Cardiology

## 2019-04-27 ENCOUNTER — Ambulatory Visit: Payer: Medicare Other | Admitting: Cardiology

## 2019-04-27 VITALS — BP 130/72 | HR 59 | Ht 63.0 in | Wt 157.1 lb

## 2019-04-27 DIAGNOSIS — Z01818 Encounter for other preprocedural examination: Secondary | ICD-10-CM | POA: Diagnosis not present

## 2019-04-27 DIAGNOSIS — I480 Paroxysmal atrial fibrillation: Secondary | ICD-10-CM | POA: Diagnosis not present

## 2019-04-27 DIAGNOSIS — E785 Hyperlipidemia, unspecified: Secondary | ICD-10-CM

## 2019-04-27 NOTE — Patient Instructions (Signed)
Medication Instructions:  Continue all medications *If you need a refill on your cardiac medications before your next appointment, please call your pharmacy*  Lab Work: None ordered   Testing/Procedures: None ordered  Follow-Up: At [facility], you and your health needs are our priority.  As part of our continuing mission to provide you with exceptional heart care, we have created designated Provider Care Teams.  These Care Teams include your primary Cardiologist (physician) and Advanced Practice Providers (APPs -  Physician Assistants and Nurse Practitioners) who all work together to provide you with the care you need, when you need it.  Your next appointment:  Wed 06/29/19   The format for your next appointment:  Office   Provider:  Dr.Schumann

## 2019-04-27 NOTE — Telephone Encounter (Signed)
[facility] faxed request for medical records to Dr. Charolette Forward  04/27/19  KLM

## 2019-05-03 ENCOUNTER — Other Ambulatory Visit (INDEPENDENT_AMBULATORY_CARE_PROVIDER_SITE_OTHER): Payer: Medicare Other

## 2019-05-03 DIAGNOSIS — I10 Essential (primary) hypertension: Secondary | ICD-10-CM | POA: Diagnosis not present

## 2019-05-03 DIAGNOSIS — E785 Hyperlipidemia, unspecified: Secondary | ICD-10-CM | POA: Diagnosis not present

## 2019-05-03 DIAGNOSIS — I480 Paroxysmal atrial fibrillation: Secondary | ICD-10-CM

## 2019-05-03 DIAGNOSIS — I4891 Unspecified atrial fibrillation: Secondary | ICD-10-CM

## 2019-05-04 ENCOUNTER — Other Ambulatory Visit: Payer: Self-pay | Admitting: Family Medicine

## 2019-05-04 NOTE — Pre-Procedure Instructions (Signed)
[facility] [address] ([address] [place] - [address] [place] [address] Phone: [phone] Fax: [phone]  [facility] - [place], [place] - [address] [facility] [address] [place] [address] [place] [address] Phone: [phone] Fax: [phone]  [id number] [place], [place] - [address] AT [facility] [address] [place] [address] Phone: [phone] Fax: [phone]      Your procedure is scheduled on Friday, November, 13th.  Report to [facility] Main Entrance "A" at 09:00 A.M., and check in at the Admitting office.  Call this number if you have problems the morning of surgery:  [phone]  Call [phone] if you have any questions prior to your surgery date Monday-Friday 8am-4pm    Remember:  Do not eat or drink after midnight the night before your surgery     Take these medicines the morning of surgery with A SIP OF WATER  amiodarone (PACERONE) estradiol (ESTRACE) levothyroxine (SYNTHROID) metoprolol tartrate (LOPRESSOR)  IF NEEDED: acetaminophen (TYLENOL)  loratadine (CLARITIN)  traMADol (ULTRAM) fluticasone (FLONASE) nasal spray  Follow your surgeon's instructions on when to stop apixaban (ELIQUIS).  If no instructions were given by your surgeon then you will need to call the office to get those instructions.     As of today, STOP taking any Aspirin (unless otherwise instructed by your surgeon), Aleve, Naproxen, Ibuprofen, Motrin, Advil, Goody's, BC's, all herbal medications, fish oil, and all vitamins.    The Morning of Surgery  Do not wear jewelry, make-up or nail polish.  Do not wear lotions, powders, perfumes, or deodorant.  Do not shave 48 hours prior to surgery.    Do not bring valuables to the hospital.  [facility] is not responsible for any belongings or valuables.  If you are a smoker, DO NOT Smoke 24 hours prior to surgery IF  you wear a CPAP at night please bring your mask, tubing, and machine the morning of surgery   Remember that you must have someone to transport you home after your surgery, and remain with you for 24 hours if you are discharged the same day.   Contacts, glasses, hearing aids, dentures or bridgework may not be worn into surgery.    Leave your suitcase in the car.  After surgery it may be brought to your room.  For patients admitted to the hospital, discharge time will be determined by your treatment team.  Patients discharged the day of surgery will not be allowed to drive home.    Special instructions:   [facility]- Preparing For Surgery  Before surgery, you can play an important role. Because skin is not sterile, your skin needs to be as free of germs as possible. You can reduce the number of germs on your skin by washing with CHG (chlorahexidine gluconate) Soap before surgery.  CHG is an antiseptic cleaner which kills germs and bonds with the skin to continue killing germs even after washing.    Oral Hygiene is also important to reduce your risk of infection.  Remember - BRUSH YOUR TEETH THE MORNING OF SURGERY WITH YOUR REGULAR TOOTHPASTE  Please do not use if you have an allergy to CHG or antibacterial soaps. If your skin becomes reddened/irritated stop using the CHG.  Do not shave (including legs and underarms) for at least 48 hours prior to first CHG shower. It is OK to shave your face.  Please follow these instructions carefully.  1. Shower the NIGHT BEFORE SURGERY and the MORNING OF SURGERY with CHG Soap.   2. If you chose to wash your hair, wash your hair first as usual with your normal shampoo.  3. After you shampoo, rinse your hair and body thoroughly to remove the shampoo.  4. Use CHG as you would any other liquid soap. You can apply CHG directly to the skin and wash gently with a scrungie or a clean washcloth.   5. Apply the CHG Soap to your body ONLY FROM THE NECK  DOWN.  Do not use on open wounds or open sores. Avoid contact with your eyes, ears, mouth and genitals (private parts). Wash Face and genitals (private parts)  with your normal soap.   6. Wash thoroughly, paying special attention to the area where your surgery will be performed.  7. Thoroughly rinse your body with warm water from the neck down.  8. DO NOT shower/wash with your normal soap after using and rinsing off the CHG Soap.  9. Pat yourself dry with a CLEAN TOWEL.  10. Wear CLEAN PAJAMAS to bed the night before surgery, wear comfortable clothes the morning of surgery  11. Place CLEAN SHEETS on your bed the night of your first shower and DO NOT SLEEP WITH PETS.    Day of Surgery:  Do not apply any deodorants/lotions. Please shower the morning of surgery with the CHG soap  Please wear clean clothes to the hospital/surgery center.   Remember to brush your teeth WITH YOUR REGULAR TOOTHPASTE.   Please read over the following fact sheets that you were given.

## 2019-05-05 ENCOUNTER — Other Ambulatory Visit (HOSPITAL_COMMUNITY)
Admission: RE | Admit: 2019-05-05 | Discharge: 2019-05-05 | Disposition: A | Discharge status: Home / Self Care | Payer: Medicare Other | Source: Ambulatory Visit | Attending: Neurosurgery | Admitting: Neurosurgery

## 2019-05-05 ENCOUNTER — Encounter (HOSPITAL_COMMUNITY)
Admission: RE | Admit: 2019-05-05 | Discharge: 2019-05-05 | Disposition: A | Discharge status: Home / Self Care | Payer: Medicare Other | Source: Ambulatory Visit | Attending: Neurosurgery | Admitting: Neurosurgery

## 2019-05-05 ENCOUNTER — Other Ambulatory Visit: Payer: Self-pay

## 2019-05-05 ENCOUNTER — Encounter (HOSPITAL_COMMUNITY): Payer: Self-pay

## 2019-05-05 DIAGNOSIS — Z79899 Other long term (current) drug therapy: Secondary | ICD-10-CM | POA: Diagnosis not present

## 2019-05-05 DIAGNOSIS — Z825 Family history of asthma and other chronic lower respiratory diseases: Secondary | ICD-10-CM | POA: Diagnosis not present

## 2019-05-05 DIAGNOSIS — Z9109 Other allergy status, other than to drugs and biological substances: Secondary | ICD-10-CM | POA: Diagnosis not present

## 2019-05-05 DIAGNOSIS — K219 Gastro-esophageal reflux disease without esophagitis: Secondary | ICD-10-CM | POA: Diagnosis not present

## 2019-05-05 DIAGNOSIS — Z885 Allergy status to narcotic agent status: Secondary | ICD-10-CM | POA: Diagnosis not present

## 2019-05-05 DIAGNOSIS — M170 Bilateral primary osteoarthritis of knee: Secondary | ICD-10-CM | POA: Diagnosis not present

## 2019-05-05 DIAGNOSIS — Z888 Allergy status to other drugs, medicaments and biological substances status: Secondary | ICD-10-CM | POA: Diagnosis not present

## 2019-05-05 DIAGNOSIS — Z880 Allergy status to penicillin: Secondary | ICD-10-CM | POA: Diagnosis not present

## 2019-05-05 DIAGNOSIS — M960 Pseudarthrosis after fusion or arthrodesis: Secondary | ICD-10-CM | POA: Diagnosis not present

## 2019-05-05 DIAGNOSIS — Z8249 Family history of ischemic heart disease and other diseases of the circulatory system: Secondary | ICD-10-CM | POA: Diagnosis not present

## 2019-05-05 DIAGNOSIS — J45909 Unspecified asthma, uncomplicated: Secondary | ICD-10-CM | POA: Diagnosis not present

## 2019-05-05 DIAGNOSIS — I10 Essential (primary) hypertension: Secondary | ICD-10-CM | POA: Diagnosis not present

## 2019-05-05 DIAGNOSIS — M479 Spondylosis, unspecified: Secondary | ICD-10-CM | POA: Diagnosis not present

## 2019-05-05 DIAGNOSIS — Z882 Allergy status to sulfonamides status: Secondary | ICD-10-CM | POA: Diagnosis not present

## 2019-05-05 DIAGNOSIS — Z01812 Encounter for preprocedural laboratory examination: Secondary | ICD-10-CM | POA: Insufficient documentation

## 2019-05-05 DIAGNOSIS — I480 Paroxysmal atrial fibrillation: Secondary | ICD-10-CM | POA: Diagnosis not present

## 2019-05-05 DIAGNOSIS — Z20828 Contact with and (suspected) exposure to other viral communicable diseases: Secondary | ICD-10-CM | POA: Insufficient documentation

## 2019-05-05 DIAGNOSIS — Z7901 Long term (current) use of anticoagulants: Secondary | ICD-10-CM | POA: Diagnosis not present

## 2019-05-05 LAB — CBC
HCT: 40.3 % (ref 36.0–46.0)
Hemoglobin: 12.8 g/dL (ref 12.0–15.0)
MCH: 30.8 pg (ref 26.0–34.0)
MCHC: 31.8 g/dL (ref 30.0–36.0)
MCV: 96.9 fL (ref 80.0–100.0)
Platelets: 240 10*3/uL (ref 150–400)
RBC: 4.16 MIL/uL (ref 3.87–5.11)
RDW: 13.5 % (ref 11.5–15.5)
WBC: 8.8 10*3/uL (ref 4.0–10.5)
nRBC: 0 % (ref 0.0–0.2)

## 2019-05-05 LAB — BASIC METABOLIC PANEL
Anion gap: 9 (ref 5–15)
BUN: 20 mg/dL (ref 8–23)
CO2: 26 mmol/L (ref 22–32)
Calcium: 8.8 mg/dL — ABNORMAL LOW (ref 8.9–10.3)
Chloride: 104 mmol/L (ref 98–111)
Creatinine, Ser: 0.83 mg/dL (ref 0.44–1.00)
GFR calc Af Amer: >60 mL/min (ref >60)
GFR calc non Af Amer: >60 mL/min (ref >60)
Glucose, Bld: 97 mg/dL (ref 70–99)
Potassium: 4.1 mmol/L (ref 3.5–5.1)
Sodium: 139 mmol/L (ref 135–145)

## 2019-05-05 LAB — ABO/RH: ABO/RH(D): O POS

## 2019-05-05 LAB — TYPE AND SCREEN
ABO/RH(D): O POS
Antibody Screen: NEGATIVE

## 2019-05-05 LAB — SARS CORONAVIRUS 2 (TAT 6-24 HRS): SARS Coronavirus 2: NEGATIVE

## 2019-05-05 LAB — SURGICAL PCR SCREEN
MRSA, PCR: NEGATIVE
Staphylococcus aureus: POSITIVE — AB

## 2019-05-05 MED ORDER — CHLORHEXIDINE GLUCONATE CLOTH 2 % EX PADS: 6.0000 | MEDICATED_PAD | Freq: Once | CUTANEOUS | Status: DC

## 2019-05-05 NOTE — Anesthesia Preprocedure Evaluation (Addendum)
Anesthesia Evaluation  Patient identified by MRN, date of birth, ID band Patient awake    Reviewed: Allergy & Precautions, NPO status , Patient's Chart, lab work & pertinent test results  History of Anesthesia Complications (+) PONV  Airway Mallampati: II  TM Distance: >3 FB Neck ROM: Full    Dental no notable dental hx. (+) Teeth Intact   Pulmonary asthma ,    Pulmonary exam normal breath sounds clear to auscultation       Cardiovascular hypertension, Normal cardiovascular exam Rhythm:Regular Rate:Normal     Neuro/Psych  Neuromuscular disease negative psych ROS   GI/Hepatic Neg liver ROS, GERD  ,  Endo/Other  Hypothyroidism   Renal/GU negative Renal ROS     Musculoskeletal  (+) Arthritis ,   Abdominal   Peds  Hematology   Anesthesia Other Findings   Reproductive/Obstetrics                           Anesthesia Physical Anesthesia Plan  ASA: III  Anesthesia Plan: General   Post-op Pain Management:    Induction: Intravenous  PONV Risk Score and Plan: 3 and Treatment may vary due to age or medical condition, Propofol infusion, Scopolamine patch - Pre-op, Midazolam, Ondansetron and Dexamethasone  Airway Management Planned: Oral ETT  Additional Equipment: None  Intra-op Plan:   Post-operative Plan: Extubation in OR  Informed Consent: I have reviewed the patients History and Physical, chart, labs and discussed the procedure including the risks, benefits and alternatives for the proposed anesthesia with the patient or authorized representative who has indicated his/her understanding and acceptance.     Dental advisory given  Plan Discussed with:   Anesthesia Plan Comments: (Seen by cardiologist Dr. Gardiner Rhyme 04/27/19 for hx of paroxysmal afib/preop eval. Per note "Preop evaluation: Prior to back surgery.  No symptoms to suggest active cardiac condition.  Good functional capacity,  greater than 4 METS.  RCRI score 0. Overall would classify as low risk for an intermediate risk surgery.  No further cardiac work-up recommended."  Preop labs reviewed, WNL.  EKG 05/02/19: Sinus bradycardia. Rate 59.  Nuclear stress 03/20/17: IMPRESSION: 1. No reversible ischemia or infarction.  2. Normal left ventricular wall motion.  3. Left ventricular ejection fraction 73%  4. Non invasive risk stratification*: Low)       Anesthesia Quick Evaluation

## 2019-05-05 NOTE — Progress Notes (Signed)
PCP - Alver Fisher Cardiologist - DR Gardiner Rhyme     -   Chest x-ray - NA EKG - 11/20 Stress Test - 5/99 ECHO - NA Cardiac Cath - NA       Blood Thinner Instructions:ELIQUIS STOPPED 6 WKS AGO Aspirin Instructions:STOP     COVID TEST- TODAY   Anesthesia review: HEART HX  Patient denies shortness of breath, fever, cough and chest pain at PAT appointment   All instructions explained to the patient, with a verbal understanding of the material. Patient agrees to go over the instructions while at home for a better understanding. Patient also instructed to self quarantine after being tested for COVID-19. The opportunity to ask questions was provided.

## 2019-05-05 NOTE — Progress Notes (Signed)
Anesthesia Chart Review:  Seen by cardiologist Dr. Gardiner Rhyme 04/27/19 for hx of paroxysmal afib/preop eval. Per note "Preop evaluation: Prior to back surgery.  No symptoms to suggest active cardiac condition.  Good functional capacity, greater than 4 METS.  RCRI score 0. Overall would classify as low risk for an intermediate risk surgery.  No further cardiac work-up recommended."  Preop labs reviewed, WNL.  EKG 05/02/19: Sinus bradycardia. Rate 59.  Nuclear stress 03/20/17: IMPRESSION: 1. No reversible ischemia or infarction.  2. Normal left ventricular wall motion.  3. Left ventricular ejection fraction 73%  4. Non invasive risk stratification*: Low

## 2019-05-06 ENCOUNTER — Inpatient Hospital Stay (HOSPITAL_COMMUNITY): Payer: Medicare Other

## 2019-05-06 ENCOUNTER — Inpatient Hospital Stay (HOSPITAL_COMMUNITY)
Admission: RE | Admit: 2019-05-06 | Discharge: 2019-05-10 | DRG: 460 | Disposition: A | Discharge status: Home / Self Care | Payer: Medicare Other | Attending: Neurosurgery | Admitting: Neurosurgery

## 2019-05-06 ENCOUNTER — Encounter (HOSPITAL_COMMUNITY): Payer: Self-pay | Admitting: *Deleted

## 2019-05-06 ENCOUNTER — Inpatient Hospital Stay (HOSPITAL_COMMUNITY): Payer: Medicare Other | Admitting: Physician Assistant

## 2019-05-06 ENCOUNTER — Encounter (HOSPITAL_COMMUNITY)
Admission: RE | Disposition: A | Discharge status: Home / Self Care | Payer: Self-pay | Source: Home / Self Care | Attending: Neurosurgery

## 2019-05-06 ENCOUNTER — Other Ambulatory Visit: Payer: Self-pay

## 2019-05-06 ENCOUNTER — Inpatient Hospital Stay (HOSPITAL_COMMUNITY): Payer: Medicare Other | Admitting: Certified Registered"

## 2019-05-06 DIAGNOSIS — Y838 Other surgical procedures as the cause of abnormal reaction of the patient, or of later complication, without mention of misadventure at the time of the procedure: Secondary | ICD-10-CM | POA: Diagnosis present

## 2019-05-06 DIAGNOSIS — Z79899 Other long term (current) drug therapy: Secondary | ICD-10-CM | POA: Diagnosis not present

## 2019-05-06 DIAGNOSIS — K219 Gastro-esophageal reflux disease without esophagitis: Secondary | ICD-10-CM | POA: Diagnosis not present

## 2019-05-06 DIAGNOSIS — R32 Unspecified urinary incontinence: Secondary | ICD-10-CM | POA: Diagnosis not present

## 2019-05-06 DIAGNOSIS — Z8249 Family history of ischemic heart disease and other diseases of the circulatory system: Secondary | ICD-10-CM

## 2019-05-06 DIAGNOSIS — Z880 Allergy status to penicillin: Secondary | ICD-10-CM

## 2019-05-06 DIAGNOSIS — I480 Paroxysmal atrial fibrillation: Secondary | ICD-10-CM | POA: Diagnosis present

## 2019-05-06 DIAGNOSIS — I10 Essential (primary) hypertension: Secondary | ICD-10-CM | POA: Diagnosis present

## 2019-05-06 DIAGNOSIS — Z885 Allergy status to narcotic agent status: Secondary | ICD-10-CM | POA: Diagnosis not present

## 2019-05-06 DIAGNOSIS — Z79890 Hormone replacement therapy (postmenopausal): Secondary | ICD-10-CM

## 2019-05-06 DIAGNOSIS — J45909 Unspecified asthma, uncomplicated: Secondary | ICD-10-CM | POA: Diagnosis not present

## 2019-05-06 DIAGNOSIS — M479 Spondylosis, unspecified: Secondary | ICD-10-CM | POA: Diagnosis present

## 2019-05-06 DIAGNOSIS — Z882 Allergy status to sulfonamides status: Secondary | ICD-10-CM

## 2019-05-06 DIAGNOSIS — Z419 Encounter for procedure for purposes other than remedying health state, unspecified: Secondary | ICD-10-CM

## 2019-05-06 DIAGNOSIS — Z90710 Acquired absence of both cervix and uterus: Secondary | ICD-10-CM | POA: Diagnosis not present

## 2019-05-06 DIAGNOSIS — Z20828 Contact with and (suspected) exposure to other viral communicable diseases: Secondary | ICD-10-CM | POA: Diagnosis present

## 2019-05-06 DIAGNOSIS — Z825 Family history of asthma and other chronic lower respiratory diseases: Secondary | ICD-10-CM | POA: Diagnosis not present

## 2019-05-06 DIAGNOSIS — I4891 Unspecified atrial fibrillation: Secondary | ICD-10-CM | POA: Diagnosis not present

## 2019-05-06 DIAGNOSIS — M960 Pseudarthrosis after fusion or arthrodesis: Principal | ICD-10-CM | POA: Diagnosis present

## 2019-05-06 DIAGNOSIS — Z9109 Other allergy status, other than to drugs and biological substances: Secondary | ICD-10-CM

## 2019-05-06 DIAGNOSIS — Z888 Allergy status to other drugs, medicaments and biological substances status: Secondary | ICD-10-CM | POA: Diagnosis not present

## 2019-05-06 DIAGNOSIS — M549 Dorsalgia, unspecified: Secondary | ICD-10-CM | POA: Diagnosis present

## 2019-05-06 DIAGNOSIS — M4326 Fusion of spine, lumbar region: Secondary | ICD-10-CM | POA: Diagnosis not present

## 2019-05-06 DIAGNOSIS — M170 Bilateral primary osteoarthritis of knee: Secondary | ICD-10-CM | POA: Diagnosis present

## 2019-05-06 DIAGNOSIS — E785 Hyperlipidemia, unspecified: Secondary | ICD-10-CM | POA: Diagnosis not present

## 2019-05-06 DIAGNOSIS — S32009K Unspecified fracture of unspecified lumbar vertebra, subsequent encounter for fracture with nonunion: Secondary | ICD-10-CM | POA: Diagnosis present

## 2019-05-06 DIAGNOSIS — Z7901 Long term (current) use of anticoagulants: Secondary | ICD-10-CM | POA: Diagnosis not present

## 2019-05-06 DIAGNOSIS — E039 Hypothyroidism, unspecified: Secondary | ICD-10-CM | POA: Diagnosis not present

## 2019-05-06 HISTORY — PX: LAMINECTOMY WITH POSTERIOR LATERAL ARTHRODESIS LEVEL 1: SHX6335

## 2019-05-06 SURGERY — LAMINECTOMY WITH POSTERIOR LATERAL ARTHRODESIS LEVEL 1
Anesthesia: General | Site: Back

## 2019-05-06 MED ORDER — METOPROLOL TARTRATE 25 MG PO TABS
25.0000 mg | ORAL_TABLET | Freq: Two times a day (BID) | ORAL | Status: DC
  Administered 2019-05-06 – 2019-05-08 (×3): 25 mg via ORAL
  Filled 2019-05-06 (×5): qty 1

## 2019-05-06 MED ORDER — ONDANSETRON HCL 4 MG/2ML IJ SOLN
4.0000 mg | Freq: Four times a day (QID) | INTRAMUSCULAR | Status: DC | PRN
  Administered 2019-05-06 – 2019-05-09 (×8): 4 mg via INTRAVENOUS
  Filled 2019-05-06 (×8): qty 2

## 2019-05-06 MED ORDER — DEXAMETHASONE SODIUM PHOSPHATE 10 MG/ML IJ SOLN
10.0000 mg | Freq: Four times a day (QID) | INTRAMUSCULAR | Status: DC
  Administered 2019-05-06 – 2019-05-10 (×15): 10 mg via INTRAVENOUS
  Filled 2019-05-06 (×19): qty 1

## 2019-05-06 MED ORDER — MIDAZOLAM HCL 2 MG/2ML IJ SOLN
INTRAMUSCULAR | Status: AC
  Filled 2019-05-06: qty 2

## 2019-05-06 MED ORDER — ACETAMINOPHEN 10 MG/ML IV SOLN
INTRAVENOUS | Status: DC | PRN
  Administered 2019-05-06: 1000 mg via INTRAVENOUS

## 2019-05-06 MED ORDER — OXYCODONE HCL 5 MG PO TABS
10.0000 mg | ORAL_TABLET | ORAL | Status: DC | PRN
  Filled 2019-05-06: qty 2

## 2019-05-06 MED ORDER — ROPINIROLE HCL 1 MG PO TABS
1.0000 mg | ORAL_TABLET | Freq: Every day | ORAL | Status: DC
  Administered 2019-05-06 – 2019-05-09 (×4): 1 mg via ORAL
  Filled 2019-05-06 (×4): qty 1

## 2019-05-06 MED ORDER — PROPOFOL 500 MG/50ML IV EMUL
INTRAVENOUS | Status: DC | PRN
  Administered 2019-05-06: 75 ug/kg/min via INTRAVENOUS

## 2019-05-06 MED ORDER — THROMBIN 20000 UNITS EX SOLR
CUTANEOUS | Status: AC
  Filled 2019-05-06: qty 20000

## 2019-05-06 MED ORDER — FENTANYL CITRATE (PF) 250 MCG/5ML IJ SOLN
INTRAMUSCULAR | Status: AC
  Filled 2019-05-06: qty 5

## 2019-05-06 MED ORDER — LIDOCAINE 2% (20 MG/ML) 5 ML SYRINGE
INTRAMUSCULAR | Status: AC
  Filled 2019-05-06: qty 5

## 2019-05-06 MED ORDER — ONDANSETRON HCL 4 MG PO TABS
4.0000 mg | ORAL_TABLET | Freq: Four times a day (QID) | ORAL | Status: DC | PRN
  Administered 2019-05-10: 4 mg via ORAL
  Filled 2019-05-06: qty 1

## 2019-05-06 MED ORDER — DEXAMETHASONE SODIUM PHOSPHATE 10 MG/ML IJ SOLN
INTRAMUSCULAR | Status: AC
  Filled 2019-05-06: qty 1

## 2019-05-06 MED ORDER — SODIUM CHLORIDE (PF) 0.9 % IJ SOLN
INTRAMUSCULAR | Status: DC | PRN
  Administered 2019-05-06: 5 mL

## 2019-05-06 MED ORDER — ACETAMINOPHEN 500 MG PO TABS: 1000.0000 mg | ORAL_TABLET | Freq: Once | ORAL | Status: DC

## 2019-05-06 MED ORDER — LOSARTAN POTASSIUM 50 MG PO TABS
50.0000 mg | ORAL_TABLET | Freq: Every day | ORAL | Status: DC
  Administered 2019-05-10: 10:00:00 50 mg via ORAL
  Filled 2019-05-06 (×2): qty 1

## 2019-05-06 MED ORDER — SCOPOLAMINE 1 MG/3DAYS TD PT72
1.0000 | MEDICATED_PATCH | TRANSDERMAL | Status: DC
  Administered 2019-05-06: 1 via TRANSDERMAL

## 2019-05-06 MED ORDER — LIDOCAINE-EPINEPHRINE 1 %-1:100000 IJ SOLN
INTRAMUSCULAR | Status: DC | PRN
  Administered 2019-05-06: 10 mL

## 2019-05-06 MED ORDER — LEVOTHYROXINE SODIUM 75 MCG PO TABS
75.0000 ug | ORAL_TABLET | Freq: Every day | ORAL | Status: DC
  Administered 2019-05-07 – 2019-05-10 (×4): 75 ug via ORAL
  Filled 2019-05-06 (×4): qty 1

## 2019-05-06 MED ORDER — PHENYLEPHRINE HCL-NACL 10-0.9 MG/250ML-% IV SOLN
INTRAVENOUS | Status: DC | PRN
  Administered 2019-05-06: 50 ug/min via INTRAVENOUS

## 2019-05-06 MED ORDER — HEPARIN SODIUM (PORCINE) 1000 UNIT/ML IJ SOLN
INTRAMUSCULAR | Status: DC | PRN
  Administered 2019-05-06: 5000 [IU]

## 2019-05-06 MED ORDER — METHOCARBAMOL 1000 MG/10ML IJ SOLN
1000.0000 mg | Freq: Four times a day (QID) | INTRAVENOUS | Status: DC | PRN
  Administered 2019-05-06: 1000 mg via INTRAVENOUS
  Filled 2019-05-06 (×2): qty 10

## 2019-05-06 MED ORDER — KETAMINE HCL 10 MG/ML IJ SOLN
INTRAMUSCULAR | Status: DC | PRN
  Administered 2019-05-06: 25 mg via INTRAVENOUS

## 2019-05-06 MED ORDER — MENTHOL 3 MG MT LOZG: 1.0000 | LOZENGE | OROMUCOSAL | Status: DC | PRN

## 2019-05-06 MED ORDER — LIDOCAINE 2% (20 MG/ML) 5 ML SYRINGE
INTRAMUSCULAR | Status: DC | PRN
  Administered 2019-05-06: 100 mg via INTRAVENOUS

## 2019-05-06 MED ORDER — FENTANYL CITRATE (PF) 100 MCG/2ML IJ SOLN
25.0000 ug | INTRAMUSCULAR | Status: DC | PRN
  Administered 2019-05-06 (×3): 50 ug via INTRAVENOUS

## 2019-05-06 MED ORDER — KETAMINE HCL 50 MG/5ML IJ SOSY
PREFILLED_SYRINGE | INTRAMUSCULAR | Status: AC
  Filled 2019-05-06: qty 5

## 2019-05-06 MED ORDER — ONDANSETRON HCL 4 MG/2ML IJ SOLN
INTRAMUSCULAR | Status: DC | PRN
  Administered 2019-05-06: 4 mg via INTRAVENOUS

## 2019-05-06 MED ORDER — LORATADINE 10 MG PO TABS: 10.0000 mg | ORAL_TABLET | Freq: Every day | ORAL | Status: DC | PRN

## 2019-05-06 MED ORDER — ACETAMINOPHEN 325 MG PO TABS: 650.0000 mg | ORAL_TABLET | ORAL | Status: DC | PRN

## 2019-05-06 MED ORDER — VITAMIN D 25 MCG (1000 UNIT) PO TABS
1000.0000 [IU] | ORAL_TABLET | Freq: Every day | ORAL | Status: DC
  Administered 2019-05-07 – 2019-05-10 (×4): 1000 [IU] via ORAL
  Filled 2019-05-06 (×5): qty 1

## 2019-05-06 MED ORDER — SODIUM CHLORIDE 0.9 % IV SOLN: 250.0000 mL | INTRAVENOUS | Status: DC

## 2019-05-06 MED ORDER — SODIUM CHLORIDE 0.9 % IV SOLN
INTRAVENOUS | Status: DC | PRN
  Administered 2019-05-06: 13:00:00

## 2019-05-06 MED ORDER — DEXAMETHASONE SODIUM PHOSPHATE 10 MG/ML IJ SOLN
10.0000 mg | Freq: Once | INTRAMUSCULAR | Status: AC
  Administered 2019-05-06: 10 mg via INTRAVENOUS

## 2019-05-06 MED ORDER — FENTANYL CITRATE (PF) 100 MCG/2ML IJ SOLN
INTRAMUSCULAR | Status: AC
  Filled 2019-05-06: qty 2

## 2019-05-06 MED ORDER — LIDOCAINE-EPINEPHRINE 1 %-1:100000 IJ SOLN
INTRAMUSCULAR | Status: AC
  Filled 2019-05-06: qty 1

## 2019-05-06 MED ORDER — HEPARIN SODIUM (PORCINE) 1000 UNIT/ML IJ SOLN
INTRAMUSCULAR | Status: AC
  Filled 2019-05-06: qty 1

## 2019-05-06 MED ORDER — MIDAZOLAM HCL 5 MG/5ML IJ SOLN
INTRAMUSCULAR | Status: DC | PRN
  Administered 2019-05-06: 1 mg via INTRAVENOUS

## 2019-05-06 MED ORDER — FENTANYL CITRATE (PF) 100 MCG/2ML IJ SOLN
INTRAMUSCULAR | Status: DC | PRN
  Administered 2019-05-06: 100 ug via INTRAVENOUS

## 2019-05-06 MED ORDER — ACETAMINOPHEN 10 MG/ML IV SOLN
INTRAVENOUS | Status: AC
  Filled 2019-05-06: qty 100

## 2019-05-06 MED ORDER — THROMBIN 20000 UNITS EX SOLR
CUTANEOUS | Status: DC | PRN
  Administered 2019-05-06: 13:00:00 via TOPICAL

## 2019-05-06 MED ORDER — ONDANSETRON HCL 4 MG/2ML IJ SOLN
INTRAMUSCULAR | Status: AC
  Filled 2019-05-06: qty 2

## 2019-05-06 MED ORDER — ACETAMINOPHEN 500 MG PO TABS
1000.0000 mg | ORAL_TABLET | Freq: Four times a day (QID) | ORAL | Status: DC | PRN
  Administered 2019-05-10: 1000 mg via ORAL
  Filled 2019-05-06: qty 2

## 2019-05-06 MED ORDER — PROPOFOL 10 MG/ML IV BOLUS
INTRAVENOUS | Status: DC | PRN
  Administered 2019-05-06: 130 mg via INTRAVENOUS

## 2019-05-06 MED ORDER — LACTATED RINGERS IV SOLN
INTRAVENOUS | Status: DC
  Administered 2019-05-06 (×2): via INTRAVENOUS

## 2019-05-06 MED ORDER — HYDROMORPHONE HCL 2 MG PO TABS
2.0000 mg | ORAL_TABLET | ORAL | Status: DC | PRN
  Administered 2019-05-09 – 2019-05-10 (×4): 2 mg via ORAL
  Filled 2019-05-06 (×4): qty 1

## 2019-05-06 MED ORDER — ACETAMINOPHEN 650 MG RE SUPP: 650.0000 mg | RECTAL | Status: DC | PRN

## 2019-05-06 MED ORDER — SUGAMMADEX SODIUM 200 MG/2ML IV SOLN
INTRAVENOUS | Status: DC | PRN
  Administered 2019-05-06: 200 mg via INTRAVENOUS

## 2019-05-06 MED ORDER — ALUM & MAG HYDROXIDE-SIMETH 200-200-20 MG/5ML PO SUSP: 30.0000 mL | Freq: Four times a day (QID) | ORAL | Status: DC | PRN

## 2019-05-06 MED ORDER — SODIUM CHLORIDE 0.9 % IV SOLN
0.0125 ug/kg/min | INTRAVENOUS | Status: AC
  Administered 2019-05-06: .2 ug/kg/min via INTRAVENOUS
  Filled 2019-05-06: qty 2000

## 2019-05-06 MED ORDER — AMIODARONE HCL 200 MG PO TABS: 100.0000 mg | ORAL_TABLET | ORAL | Status: DC

## 2019-05-06 MED ORDER — HYDROMORPHONE HCL 1 MG/ML IJ SOLN
1.0000 mg | INTRAMUSCULAR | Status: DC | PRN
  Administered 2019-05-06 – 2019-05-09 (×15): 1 mg via INTRAVENOUS
  Administered 2019-05-10: 0.5 mg via INTRAVENOUS
  Filled 2019-05-06 (×18): qty 1

## 2019-05-06 MED ORDER — CEFAZOLIN SODIUM-DEXTROSE 2-4 GM/100ML-% IV SOLN
2.0000 g | Freq: Three times a day (TID) | INTRAVENOUS | Status: AC
  Administered 2019-05-06 – 2019-05-08 (×6): 2 g via INTRAVENOUS
  Filled 2019-05-06 (×6): qty 100

## 2019-05-06 MED ORDER — ARTHREX ANGEL - ACD-A SOLUTION (CHARTING ONLY) OPTIME
TOPICAL | Status: DC | PRN
  Administered 2019-05-06: 20 mL via TOPICAL

## 2019-05-06 MED ORDER — TRAMADOL HCL 50 MG PO TABS
50.0000 mg | ORAL_TABLET | Freq: Three times a day (TID) | ORAL | Status: DC | PRN
  Administered 2019-05-06 – 2019-05-10 (×5): 50 mg via ORAL
  Filled 2019-05-06 (×6): qty 1

## 2019-05-06 MED ORDER — SODIUM CHLORIDE 0.9% FLUSH
3.0000 mL | Freq: Two times a day (BID) | INTRAVENOUS | Status: DC
  Administered 2019-05-07 – 2019-05-09 (×6): 3 mL via INTRAVENOUS

## 2019-05-06 MED ORDER — PROPOFOL 10 MG/ML IV BOLUS
INTRAVENOUS | Status: AC
  Filled 2019-05-06: qty 20

## 2019-05-06 MED ORDER — BUPIVACAINE LIPOSOME 1.3 % IJ SUSP
20.0000 mL | INTRAMUSCULAR | Status: AC
  Administered 2019-05-06: 20 mL
  Filled 2019-05-06: qty 20

## 2019-05-06 MED ORDER — ESTRADIOL 1 MG PO TABS
1.0000 mg | ORAL_TABLET | Freq: Every day | ORAL | Status: DC
  Administered 2019-05-07 – 2019-05-10 (×4): 1 mg via ORAL
  Filled 2019-05-06 (×4): qty 1

## 2019-05-06 MED ORDER — PANTOPRAZOLE SODIUM 40 MG IV SOLR
40.0000 mg | Freq: Every day | INTRAVENOUS | Status: DC
  Administered 2019-05-06 – 2019-05-09 (×4): 40 mg via INTRAVENOUS
  Filled 2019-05-06 (×4): qty 40

## 2019-05-06 MED ORDER — HYDROMORPHONE HCL 1 MG/ML IJ SOLN: 0.5000 mg | INTRAMUSCULAR | Status: DC | PRN

## 2019-05-06 MED ORDER — ROCURONIUM BROMIDE 10 MG/ML (PF) SYRINGE
PREFILLED_SYRINGE | INTRAVENOUS | Status: AC
  Filled 2019-05-06: qty 10

## 2019-05-06 MED ORDER — VANCOMYCIN HCL IN DEXTROSE 1-5 GM/200ML-% IV SOLN
1000.0000 mg | INTRAVENOUS | Status: AC
  Administered 2019-05-06: 09:00:00 1000 mg via INTRAVENOUS

## 2019-05-06 MED ORDER — VANCOMYCIN HCL IN DEXTROSE 1-5 GM/200ML-% IV SOLN
INTRAVENOUS | Status: AC
  Administered 2019-05-06: 1000 mg via INTRAVENOUS
  Filled 2019-05-06: qty 200

## 2019-05-06 MED ORDER — BIOTIN 5000 MCG PO CAPS: 5000.0000 ug | ORAL_CAPSULE | Freq: Every day | ORAL | Status: DC

## 2019-05-06 MED ORDER — FLUTICASONE PROPIONATE 50 MCG/ACT NA SUSP
2.0000 | Freq: Every day | NASAL | Status: DC | PRN
  Filled 2019-05-06: qty 16

## 2019-05-06 MED ORDER — CYCLOBENZAPRINE HCL 10 MG PO TABS
10.0000 mg | ORAL_TABLET | Freq: Three times a day (TID) | ORAL | Status: DC | PRN
  Filled 2019-05-06: qty 1

## 2019-05-06 MED ORDER — SCOPOLAMINE 1 MG/3DAYS TD PT72
MEDICATED_PATCH | TRANSDERMAL | Status: AC
  Filled 2019-05-06: qty 1

## 2019-05-06 MED ORDER — ACETAMINOPHEN 10 MG/ML IV SOLN
1000.0000 mg | Freq: Once | INTRAVENOUS | Status: DC | PRN
  Administered 2019-05-06: 1000 mg via INTRAVENOUS

## 2019-05-06 MED ORDER — ROCURONIUM BROMIDE 10 MG/ML (PF) SYRINGE
PREFILLED_SYRINGE | INTRAVENOUS | Status: DC | PRN
  Administered 2019-05-06: 30 mg via INTRAVENOUS
  Administered 2019-05-06: 10 mg via INTRAVENOUS
  Administered 2019-05-06: 50 mg via INTRAVENOUS

## 2019-05-06 MED ORDER — PHENOL 1.4 % MT LIQD: 1.0000 | OROMUCOSAL | Status: DC | PRN

## 2019-05-06 MED ORDER — SODIUM CHLORIDE 0.9% FLUSH
3.0000 mL | INTRAVENOUS | Status: DC | PRN
  Administered 2019-05-06: 3 mL via INTRAVENOUS
  Filled 2019-05-06: qty 3

## 2019-05-06 MED ORDER — CALCIUM CARBONATE-VITAMIN D 500-200 MG-UNIT PO TABS
1.0000 | ORAL_TABLET | Freq: Every day | ORAL | Status: DC
  Administered 2019-05-07 – 2019-05-10 (×4): 1 via ORAL
  Filled 2019-05-06 (×5): qty 1

## 2019-05-06 MED ORDER — ONDANSETRON HCL 4 MG/2ML IJ SOLN: 4.0000 mg | Freq: Once | INTRAMUSCULAR | Status: DC | PRN

## 2019-05-06 MED ORDER — 0.9 % SODIUM CHLORIDE (POUR BTL) OPTIME
TOPICAL | Status: DC | PRN
  Administered 2019-05-06: 1000 mL

## 2019-05-06 SURGICAL SUPPLY — 84 items
ADH SKN CLS APL DERMABOND .7 (GAUZE/BANDAGES/DRESSINGS) ×1
APL SKNCLS STERI-STRIP NONHPOA (GAUZE/BANDAGES/DRESSINGS) ×1
BAG DECANTER FOR FLEXI CONT (MISCELLANEOUS) ×2 IMPLANT
BENZOIN TINCTURE PRP APPL 2/3 (GAUZE/BANDAGES/DRESSINGS) ×2 IMPLANT
BLADE CLIPPER SURG (BLADE) IMPLANT
BLADE SURG 11 STRL SS (BLADE) ×2 IMPLANT
BNDG GAUZE ELAST 4 BULKY (GAUZE/BANDAGES/DRESSINGS) ×1 IMPLANT
BUR MATCHSTICK NEURO 3.0 LAGG (BURR) ×2 IMPLANT
BUR PRECISION FLUTE 6.0 (BURR) ×2 IMPLANT
CANISTER SUCT 3000ML PPV (MISCELLANEOUS) ×2 IMPLANT
CAP LOCKING THREADED (Cap) ×4 IMPLANT
CARTRIDGE OIL MAESTRO DRILL (MISCELLANEOUS) ×1 IMPLANT
CONT SPEC 4OZ CLIKSEAL STRL BL (MISCELLANEOUS) ×2 IMPLANT
COVER BACK TABLE 24X17X13 BIG (DRAPES) IMPLANT
COVER BACK TABLE 60X90IN (DRAPES) ×2 IMPLANT
COVER WAND RF STERILE (DRAPES) ×1 IMPLANT
DECANTER SPIKE VIAL GLASS SM (MISCELLANEOUS) ×2 IMPLANT
DERMABOND ADVANCED (GAUZE/BANDAGES/DRESSINGS) ×1
DERMABOND ADVANCED .7 DNX12 (GAUZE/BANDAGES/DRESSINGS) ×1 IMPLANT
DIFFUSER DRILL AIR PNEUMATIC (MISCELLANEOUS) ×2 IMPLANT
DRAPE C-ARM 42X72 X-RAY (DRAPES) ×2 IMPLANT
DRAPE C-ARMOR (DRAPES) ×1 IMPLANT
DRAPE HALF SHEET 40X57 (DRAPES) IMPLANT
DRAPE LAPAROTOMY 100X72X124 (DRAPES) ×2 IMPLANT
DRAPE POUCH INSTRU U-SHP 10X18 (DRAPES) ×1 IMPLANT
DRAPE SURG 17X23 STRL (DRAPES) ×2 IMPLANT
DRSG OPSITE 4X5.5 SM (GAUZE/BANDAGES/DRESSINGS) ×2 IMPLANT
DRSG OPSITE POSTOP 4X8 (GAUZE/BANDAGES/DRESSINGS) ×2 IMPLANT
DRSG TELFA 3X8 NADH (GAUZE/BANDAGES/DRESSINGS) ×2 IMPLANT
DURAPREP 26ML APPLICATOR (WOUND CARE) ×2 IMPLANT
ELECT REM PT RETURN 9FT ADLT (ELECTROSURGICAL) ×2
ELECTRODE REM PT RTRN 9FT ADLT (ELECTROSURGICAL) ×1 IMPLANT
EVACUATOR 1/8 PVC DRAIN (DRAIN) ×1 IMPLANT
EVACUATOR 3/16  PVC DRAIN (DRAIN)
EVACUATOR 3/16 PVC DRAIN (DRAIN) ×1 IMPLANT
GAUZE 4X4 16PLY RFD (DISPOSABLE) IMPLANT
GAUZE SPONGE 4X4 12PLY STRL (GAUZE/BANDAGES/DRESSINGS) ×2 IMPLANT
GLOVE BIO SURGEON STRL SZ7 (GLOVE) ×4 IMPLANT
GLOVE BIO SURGEON STRL SZ8 (GLOVE) ×4 IMPLANT
GLOVE BIOGEL PI IND STRL 6.5 (GLOVE) IMPLANT
GLOVE BIOGEL PI IND STRL 7.0 (GLOVE) IMPLANT
GLOVE BIOGEL PI IND STRL 8 (GLOVE) ×4 IMPLANT
GLOVE BIOGEL PI INDICATOR 6.5 (GLOVE) ×1
GLOVE BIOGEL PI INDICATOR 7.0 (GLOVE) ×4
GLOVE BIOGEL PI INDICATOR 8 (GLOVE) ×4
GLOVE EXAM NITRILE XL STR (GLOVE) IMPLANT
GLOVE INDICATOR 8.5 STRL (GLOVE) ×4 IMPLANT
GOWN STRL REUS W/ TWL LRG LVL3 (GOWN DISPOSABLE) ×3 IMPLANT
GOWN STRL REUS W/ TWL XL LVL3 (GOWN DISPOSABLE) ×2 IMPLANT
GOWN STRL REUS W/TWL 2XL LVL3 (GOWN DISPOSABLE) ×2 IMPLANT
GOWN STRL REUS W/TWL LRG LVL3 (GOWN DISPOSABLE) ×6
GOWN STRL REUS W/TWL XL LVL3 (GOWN DISPOSABLE) ×4
GRAFT BN 5X1XSPNE CVD POST DBM (Bone Implant) IMPLANT
GRAFT BONE MAGNIFUSE 1X5CM (Bone Implant) ×2 IMPLANT
KIT BASIN OR (CUSTOM PROCEDURE TRAY) ×2 IMPLANT
KIT BONE MRW ASP ANGEL CPRP (KITS) ×2 IMPLANT
KIT TURNOVER KIT B (KITS) ×2 IMPLANT
MARKER SKIN DUAL TIP RULER LAB (MISCELLANEOUS) ×1 IMPLANT
NDL HYPO 18GX1.5 BLUNT FILL (NEEDLE) IMPLANT
NDL HYPO 25X1 1.5 SAFETY (NEEDLE) ×1 IMPLANT
NEEDLE HYPO 18GX1.5 BLUNT FILL (NEEDLE) ×4 IMPLANT
NEEDLE HYPO 25X1 1.5 SAFETY (NEEDLE) ×2 IMPLANT
NS IRRIG 1000ML POUR BTL (IV SOLUTION) ×2 IMPLANT
OIL CARTRIDGE MAESTRO DRILL (MISCELLANEOUS) ×2
PACK LAMINECTOMY NEURO (CUSTOM PROCEDURE TRAY) ×2 IMPLANT
PAD ARMBOARD 7.5X6 YLW CONV (MISCELLANEOUS) ×6 IMPLANT
PUTTY DBM ALLOSYNC PURE 10CC (Putty) ×1 IMPLANT
ROD CREO 45MM SPINAL (Rod) ×4 IMPLANT
SCREW AMP MODULAR CREO 6.5X45 (Screw) ×2 IMPLANT
SCREW PA THRD CREO TULIP 5.5X4 (Head) ×4 IMPLANT
SHAFT CREO 30MM (Neuro Prosthesis/Implant) ×2 IMPLANT
SPONGE LAP 4X18 RFD (DISPOSABLE) IMPLANT
SPONGE SURGIFOAM ABS GEL 100 (HEMOSTASIS) ×2 IMPLANT
STRIP CLOSURE SKIN 1/2X4 (GAUZE/BANDAGES/DRESSINGS) ×3 IMPLANT
SUT VIC AB 0 CT1 18XCR BRD8 (SUTURE) ×2 IMPLANT
SUT VIC AB 0 CT1 8-18 (SUTURE) ×4
SUT VIC AB 2-0 CT1 18 (SUTURE) ×4 IMPLANT
SUT VICRYL 4-0 PS2 18IN ABS (SUTURE) ×2 IMPLANT
SYR 20ML LL LF (SYRINGE) ×1 IMPLANT
SYR CONTROL 10ML LL (SYRINGE) ×1 IMPLANT
TOWEL GREEN STERILE (TOWEL DISPOSABLE) ×2 IMPLANT
TOWEL GREEN STERILE FF (TOWEL DISPOSABLE) ×2 IMPLANT
TRAY FOLEY MTR SLVR 16FR STAT (SET/KITS/TRAYS/PACK) ×2 IMPLANT
WATER STERILE IRR 1000ML POUR (IV SOLUTION) ×2 IMPLANT

## 2019-05-06 NOTE — Op Note (Signed)
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  Surgeon: Dominica Severin Felicie Kocher  Assistant: Nash Shearer  Anesthesia: General  EBL: Minimal  HPI: 73 year old female who underwent previous L2-3 anterolateral fusion in outside institution and had previously undergone L3-L5 fusion the L3-L5 fusion took however she had a pseudoarthrosis at L2-3 she was symptomatic from.  She failed all forms conservative treatment and we recommended redo fusion at L2-3 through a posterior lateral approach with placement of bilateral L2 cortical screws tying into her old construct or removal and replacement.  I extensively went over the risks and benefits of that operation with her as well as perioperative course expectations of outcome and alternatives of surgery and she understands and agrees to proceed forward.  Operative procedure: Patient brought into the OR was due to general anesthesia positioned prone Wilson frame the back was prepped and draped in routine sterile fashion roll incision was opened up and extended cephalad subperiosteal dissection carried lamina of L2 exposing the one two and two three facet joints and the cortical screw entry points at L2.  I then exposed the old hardware removed it removed all six pedicle screws then at this point I did a subfascial incision and dissected down to the left posterior superior iliac spine and dissected across the iliac crest from there aspirated 60 cc of bone marrow that was used and spun down and mixed with the Pure allograft.  Then I placed under fluoroscopy with bilateral L2 cortical screws both screws excellent purchase I  replaced the L3 screw through the same holes one size larger I took out 6.2 540 I replaced it with 6.5 x 45.  All screws had excellent purchase.  Then I aggressively irrigated the wound and aggressively decorticated the facet joints lateral pars lamina and TP from L2 down to L3 packed posterior laterally the pure allograft and BMA mixture and then augmented it with magna fuse.  Then assembled the heads used a 45 mm rod anchored everything in place placed a medium Hemovac drain injected Exparel in the fascia and closed the wound in layers with a running 4 subcuticular.  At the end the case all needle count sponge counts were correct wound was dressed with benzoin Dermabond and Steri-Strips.

## 2019-05-06 NOTE — Social Work (Signed)
CSW acknowledging consult for SNF placement. Will follow for therapy recommendations needed to best determine disposition/for insurance authorization.   Sevastian Witczak, MSW, LCSWA [facility] Social Work [phone]   
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
## 2019-05-06 NOTE — Anesthesia Procedure Notes (Signed)
Procedure Name: Intubation Date/Time: 05/06/2019 11:39 AM Performed by: Moshe Salisbury, CRNA Pre-anesthesia Checklist: Patient identified, Emergency Drugs available, Suction available and Patient being monitored Patient Re-evaluated:Patient Re-evaluated prior to induction Oxygen Delivery Method: Circle System Utilized Preoxygenation: Pre-oxygenation with 100% oxygen Induction Type: IV induction Ventilation: Mask ventilation without difficulty Laryngoscope Size: Mac and 3 Grade View: Grade II Tube type: Oral Tube size: 7.5 mm Number of attempts: 1 Airway Equipment and Method: Stylet Placement Confirmation: ETT inserted through vocal cords under direct vision,  positive ETCO2 and breath sounds checked- equal and bilateral Secured at: 22 cm Tube secured with: Tape Dental Injury: Teeth and Oropharynx as per pre-operative assessment

## 2019-05-06 NOTE — Transfer of Care (Signed)
Immediate Anesthesia Transfer of Care Note  Patient: Melissa Jordan  Procedure(s) Performed: Posterior lateral fusion - Lumbar two-three with  cortical screw placement (N/A Back)  Patient Location: PACU  Anesthesia Type:General  Level of Consciousness: alert  and drowsy  Airway & Oxygen Therapy: Patient Spontanous Breathing and Patient connected to nasal cannula oxygen  Post-op Assessment: Report given to RN, Post -op Vital signs reviewed and stable and Patient moving all extremities X 4  Post vital signs: Reviewed and stable  Last Vitals:  Vitals Value Taken Time  BP 145/64 05/06/19 1408  Temp    Pulse 77 05/06/19 1411  Resp 15 05/06/19 1411  SpO2 100 % 05/06/19 1411  Vitals shown include unvalidated device data.  Last Pain:  Vitals:   05/06/19 0851  TempSrc: Oral         Complications: No apparent anesthesia complications

## 2019-05-06 NOTE — OR Nursing (Signed)
Patient has very fagile skin and in process of positioning patient prone for the procedure she sustained four skin tears, three on right forearm and one on left forearm.Tegaderms were applied until surgery finished. Once procedure complete, tears cleaned with normal saline and telfa dressing applied with Kerlix dressing.

## 2019-05-07 MED ORDER — POLYETHYLENE GLYCOL 3350 17 G PO PACK
17.0000 g | PACK | Freq: Every day | ORAL | Status: DC
  Administered 2019-05-07 – 2019-05-10 (×4): 17 g via ORAL
  Filled 2019-05-07 (×4): qty 1

## 2019-05-07 MED ORDER — METHOCARBAMOL 500 MG PO TABS
1000.0000 mg | ORAL_TABLET | Freq: Four times a day (QID) | ORAL | Status: DC | PRN
  Administered 2019-05-07 – 2019-05-10 (×3): 1000 mg via ORAL
  Filled 2019-05-07 (×3): qty 2

## 2019-05-07 MED ORDER — DOCUSATE SODIUM 100 MG PO CAPS
100.0000 mg | ORAL_CAPSULE | Freq: Two times a day (BID) | ORAL | Status: DC | PRN
  Administered 2019-05-07: 100 mg via ORAL
  Filled 2019-05-07: qty 1

## 2019-05-07 MED ORDER — PROMETHAZINE HCL 25 MG/ML IJ SOLN
12.5000 mg | Freq: Four times a day (QID) | INTRAMUSCULAR | Status: DC | PRN
  Administered 2019-05-07: 12.5 mg via INTRAVENOUS
  Filled 2019-05-07 (×2): qty 1

## 2019-05-07 MED ORDER — PROMETHAZINE HCL 12.5 MG RE SUPP
12.5000 mg | Freq: Four times a day (QID) | RECTAL | Status: DC | PRN
  Filled 2019-05-07: qty 2

## 2019-05-07 MED ORDER — FLEET ENEMA 7-19 GM/118ML RE ENEM: 1.0000 | ENEMA | Freq: Every day | RECTAL | Status: DC | PRN

## 2019-05-07 MED ORDER — PROMETHAZINE HCL 25 MG PO TABS: 25.0000 mg | ORAL_TABLET | Freq: Four times a day (QID) | ORAL | Status: DC | PRN

## 2019-05-07 NOTE — Evaluation (Signed)
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  She also previously been fused from L3-L5 which look to be solid. Admitted 05/06/19 for Posterior lateral fusion L2-3 with removal hardware L3-5 and replace hardware at L3. PMH-PAF, HTN, arthritis back, knees; osteopenia, urinary urgency  Clinical Impression   Patient is s/p above surgery resulting in functional limitations due to the deficits listed below (see PT Problem List). Patient very limited with mobility this date due to hypotension with BP decreasing further upon sitting EOB and pt becoming more lightheaded as trying to do EOB exercises to improve BP. Prior to sitting EOB, pt had taken pain and nausea medicine. Prior to EOB and prior to bP dropping, she made several statements where she was saying the wrong word and had difficulty finding the right word. Will continue to assess cognition during next session. Patient will benefit from skilled PT to increase their independence and safety with mobility to allow discharge to the venue listed below.       Follow Up Recommendations No PT follow up;Supervision/Assistance - 24 hour(initial few days 24/7 due to ?cognition)    Equipment Recommendations  None recommended by PT    Recommendations for Other Services       Precautions / Restrictions Precautions Precautions: Back Precaution Booklet Issued: Yes (comment) Precaution Comments: required vc to avoid twisting Required Braces or Orthoses: (no back brace) Restrictions Weight Bearing Restrictions: No      Mobility  Bed Mobility Overal bed mobility: Needs Assistance Bed Mobility: Rolling;Sidelying to Sit;Sit to Sidelying Rolling: Min assist Sidelying to sit: Min assist      Sit to sidelying: Min assist General bed mobility comments: verbal cues for back precustions and log rolling technique.  Assist to lift trunk from bed and to lift LEs back onto bed   Transfers                 General transfer comment: unable to attempt due to orthostatic hypotension   Ambulation/Gait                Stairs            Wheelchair Mobility    Modified Rankin (Stroke Patients Only)       Balance Overall balance assessment: Needs assistance Sitting-balance support: Feet supported Sitting balance-Leahy Scale: Fair Sitting balance - Comments: able to maintain static sitting                                      Pertinent Vitals/Pain Pain Assessment: Faces Faces Pain Scale: Hurts little more Pain Location: back  Pain Descriptors / Indicators: Grimacing Pain Intervention(s): Limited activity within patient's tolerance;Monitored during session;Premedicated before session;Repositioned    Home Living Family/patient expects to be discharged to:: Private residence Living Arrangements: Spouse/significant other Available Help at Discharge: Family;Available PRN/intermittently(husband; can be there as much as needed) Type of Home: House Home Access: Stairs to enter Entrance Stairs-Rails: Right;Left Entrance Stairs-Number of Steps: 11 Home Layout: Two level;Laundry or work area in [place]: Kasandra Knudsen - single point;Shower seat;Bedside commode;Grab bars - tub/shower;Walker - 2 wheels;Adaptive equipment      Prior Function Level of Independence: Independent  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   Dominant Hand: Right    Extremity/Trunk Assessment   Upper Extremity Assessment Upper Extremity Assessment: Defer to OT evaluation    Lower Extremity Assessment Lower Extremity Assessment: RLE deficits/detail RLE Deficits / Details: reports numbness at ball of foot is new and MD aware    Cervical  / Trunk Assessment Cervical / Trunk Assessment: Other exceptions Cervical / Trunk Exceptions: s/p lumbar fusion   Communication   Communication: No difficulties  Cognition Arousal/Alertness: Awake/alert Behavior During Therapy: WFL for tasks assessed/performed Overall Cognitive Status: No family/caregiver present to determine baseline cognitive functioning                                 General Comments: Pt is a bit tangential and confusing details of info.  Unsure if this is pt baseline or if secondary to medications       General Comments General comments (skin integrity, edema, etc.): BP supine 97/53; sitting after 5 mins 85/57 (pt symptomatic); supine 102/57 - RN notified     Exercises Other Exercises Other Exercises: seated ankle and hand pumps, elbow flexion attempting to improve BP   Assessment/Plan    PT Assessment Patient needs continued PT services  PT Problem List Decreased activity tolerance;Decreased balance;Decreased mobility;Decreased cognition;Decreased knowledge of use of DME;Decreased knowledge of precautions;Cardiopulmonary status limiting activity;Impaired sensation;Pain       PT Treatment Interventions DME instruction;Gait training;Stair training;Functional mobility training;Therapeutic activities;Cognitive remediation;Patient/family education    PT Goals (Current goals can be found in the Care Plan section)  Acute Rehab PT Goals Patient Stated Goal: "I need to get out of this bed and get moving"  PT Goal Formulation: With patient Time For Goal Achievement: 05/21/19 Potential to Achieve Goals: Good    Frequency Min 5X/week   Barriers to discharge Inaccessible home environment 11 steps to enter    Co-evaluation PT/OT/SLP Co-Evaluation/Treatment: Yes Reason for Co-Treatment: For patient/therapist safety PT goals addressed during session: Mobility/safety with mobility OT goals addressed during session: ADL's and self-care        AM-PAC PT "6 Clicks" Mobility  Outcome Measure Help needed turning from your back to your side while in a flat bed without using bedrails?: A Little Help needed moving from lying on your back to sitting on the side of a flat bed without using bedrails?: A Little Help needed moving to and from a bed to a chair (including a wheelchair)?: A Lot Help needed standing up from a chair using your arms (e.g., wheelchair or bedside chair)?: A Lot Help needed to walk in hospital room?: Total Help needed climbing 3-5 steps with a railing? : Total 6 Click Score: 12    End of Session   Activity Tolerance: Treatment limited secondary to medical complications (Comment)(decr BP) Patient left: in bed;with call bell/phone within reach;with SCD's reapplied Nurse Communication: Mobility status;Other (comment)(hypotension at EOB) PT Visit Diagnosis: Other abnormalities of gait and mobility (R26.89);Pain Pain - part of body: (back)    Time: 4270-6237 PT Time Calculation (min) (ACUTE ONLY): 29 min   Charges:   PT Evaluation $PT Eval Low Complexity: 1 Low           Veda Canning, PT Pager [phone]   Zena Amos 05/07/2019, 3:13 PM

## 2019-05-07 NOTE — Anesthesia Postprocedure Evaluation (Signed)
Anesthesia Post Note  Patient: Melissa Jordan  Procedure(s) Performed: Posterior lateral fusion - Lumbar two-three with  cortical screw placement (N/A Back)     Patient location during evaluation: PACU Anesthesia Type: General Level of consciousness: awake and alert Pain management: pain level controlled Vital Signs Assessment: post-procedure vital signs reviewed and stable Respiratory status: spontaneous breathing, nonlabored ventilation, respiratory function stable and patient connected to nasal cannula oxygen Cardiovascular status: blood pressure returned to baseline and stable Postop Assessment: no apparent nausea or vomiting Anesthetic complications: no    Last Vitals:  Vitals:   05/07/19 0421 05/07/19 0745  BP: 103/60 (!) 94/55  Pulse: 67   Resp: 18   Temp: 36.6 C (!) 36.2 C  SpO2: 99%     Last Pain:  Vitals:   05/07/19 0800  TempSrc:   PainSc: [employer]

## 2019-05-07 NOTE — Progress Notes (Signed)
   Providing Compassionate, Quality Care - Together   Subjective: Patient reports she had a "rough night" secondary to pain control. She reports the nurse worked with her and they finally got her pain under control. She is comfortable at the time of the assessment. She requests Miralax and a stool softener as she has gotten severely constipated post operatively in the past.  Objective: Vital signs in last 24 hours: Temp:  [97.2 F (36.2 C)-98.3 F (36.8 C)] 97.2 F (36.2 C) (11/14 0745) Pulse Rate:  [61-70] 67 (11/14 0421) Resp:  [12-18] 18 (11/14 0421) BP: (103-159)/(60-77) 103/60 (11/14 0421) SpO2:  [96 %-100 %] 99 % (11/14 0421) Weight:  [68.9 kg] 68.9 kg (11/13 0851)  Intake/Output from previous day: 11/13 0701 - 11/14 0700 In: 1215.4 [I.V.:1115.4; IV Piggyback:100] Out: 1638 [Urine:3200; Drains:135; Blood:200] Intake/Output this shift: No intake/output data recorded.  Alert and oriented x 4 PERRLA CN II-XII grossly intact MAE, Strength intact Mild numbness in right foot, specifically at her toes Incision is covered with Honeycomb dressing and Steri Strips; Dressing is clean, dry, and intact Hemovac in place with minimal drainage overnight   Lab Results: Recent Labs    05/05/19 1000  WBC 8.8  HGB 12.8  HCT 40.3  PLT 240   BMET Recent Labs    05/05/19 1000  NA 139  K 4.1  CL 104  CO2 26  GLUCOSE 97  BUN 20  CREATININE 0.83  CALCIUM 8.8*    Studies/Results: Dg Lumbar Spine 2-3 Views  Result Date: 05/06/2019 CLINICAL DATA:  Posterolateral fusion. EXAM: DG C-ARM 1-60 MIN; LUMBAR SPINE - 2-3 VIEW CONTRAST:  No prior. FLUOROSCOPY TIME:  Fluoroscopy Time:  0 minutes 35 seconds Number of Acquired Spot Images: 2 COMPARISON:  CT 08/26/2018. FINDINGS: Lateral fusion lower lumbar spine. Hardware intact. Posterior pedicle screws noted and are intact. No acute bony abnormality. Bony alignment. IMPRESSION: Postsurgical changes lower lumbar spine. Hardware intact.  Bony alignment. Electronically Signed   By: Marcello Moores  Register   On: 05/06/2019 13:23   Dg C-arm 1-60 Min  Result Date: 05/06/2019 CLINICAL DATA:  Posterolateral fusion. EXAM: DG C-ARM 1-60 MIN; LUMBAR SPINE - 2-3 VIEW CONTRAST:  No prior. FLUOROSCOPY TIME:  Fluoroscopy Time:  0 minutes 35 seconds Number of Acquired Spot Images: 2 COMPARISON:  CT 08/26/2018. FINDINGS: Lateral fusion lower lumbar spine. Hardware intact. Posterior pedicle screws noted and are intact. No acute bony abnormality. Bony alignment. IMPRESSION: Postsurgical changes lower lumbar spine. Hardware intact. Bony alignment. Electronically Signed   By: Marcello Moores  Register   On: 05/06/2019 13:23    Assessment/Plan: Patient is one day status post posterior lateral fusion, with placement of bilateral L2 cortical screws and exploration of fusion. Dr. Saintclair Halsted removed the hardware at L3-L5, with replacement of the L3 screws with posterior lateral arthrodesis.    LOS: 1 day    -Discontinue Hemovac -Stool softener/laxative ordered -Mobilize with therapies-awaiting recommendations to determine disposition -Robaxin changed to PO -Phenergan added for nausea/vomiting not responding to Blevins, DNP, AGNP-C Nurse Practitioner  [facility]. [address], [address], [place], [place] [address] P: [phone]    F: [phone]  05/07/2019, 8:26 AM

## 2019-05-07 NOTE — Progress Notes (Signed)
Physical Therapy Treatment Note  Clinical Impression: Patient up in chair and calling out for assistance to go to the bathroom. Patient reports was feeling better when nursing helped her OOB to chair, but now ready to get back to bed. Ambulated to bathroom with min assist (pt reaching to hold onto IV pole for stability, refuses RW). Began feeling more lightheaded while on toilet with MAP 68. Able to walk back to bed and by end of session BP 110/71 supine. Patient is a bit impulsive and requires vc to maintain back precautions. Can continue to benefit from PT.     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  She also previously been fused from L3-L5 which look to be solid. Admitted 05/06/19 for Posterior lateral fusion L2-3 with removal hardware L3-5 and replace hardware at L3. PMH-PAF, HTN, arthritis back, knees; osteopenia, urinary urgency  Subjective Data  Patient Stated Goal "I need to get out of this bed and get moving"   Precautions  Precautions Back  Precaution Booklet Issued Yes (comment)  Precaution Comments required vc to avoid twisting and bending  Required Braces or Orthoses  (no back brace)  Restrictions  Weight Bearing Restrictions No  Pain Assessment  Pain Assessment 0-10  Pain Score 4  Pain Location back   Pain Descriptors / Indicators Operative site guarding  Pain Intervention(s) Monitored during session;Repositioned  Cognition  Arousal/Alertness Awake/alert  Behavior During Therapy WFL for tasks assessed/performed  Overall Cognitive Status No family/caregiver present to determine baseline cognitive functioning  General Comments less confused; no word-finding issues  Bed Mobility  Overal bed mobility Needs Assistance  Bed Mobility Rolling;Sit  to Sidelying  Rolling Min guard  Sit to sidelying Min assist  General bed mobility comments verbal cues for back precustions and log rolling technique.  Assist to lift LEs back onto bed   Transfers  Overall transfer level Needs assistance  Transfers Sit to/from Stand  Sit to Stand Min guard  General transfer comment from recliner, toilet  Ambulation/Gait  Ambulation/Gait assistance Min assist  Gait Distance (Feet) 15 Feet (x2)  Assistive device IV Pole  Gait Pattern/deviations Step-through pattern;Decreased stride length;Wide base of support  General Gait Details refused RW and reaching to hold onto Iv pole;   Balance  Overall balance assessment Needs assistance  Sitting-balance support Feet supported  Sitting balance-Leahy Scale Fair  Sitting balance - Comments able to maintain static sitting   Standing balance support No upper extremity supported  Standing balance-Leahy Scale Fair  Standing balance comment static standing at sink  General Comments  General comments (skin integrity, edema, etc.) sitting on toilet BP 88/57 (MAP 68); on return to supine 110/71  PT - End of Session  Activity Tolerance Treatment limited secondary to medical complications (Comment) (decr BP)  Patient left in bed;with call bell/phone within reach;with SCD's reapplied;with family/visitor present   PT - Assessment/Plan  PT Plan Current plan remains appropriate  PT Visit Diagnosis Other abnormalities of gait and mobility (R26.89);Pain  Pain - part of body  (back)  PT Frequency (ACUTE ONLY) Min 5X/week  Follow Up Recommendations No PT follow up;Supervision/Assistance - 24 hour (initial few days 24/7 due to ?cognition)  PT equipment None recommended by PT  AM-PAC PT "6 Clicks" Mobility Outcome Measure (Version 2)  Help  needed turning from your back to your side while in a flat bed without using bedrails? 3  Help needed moving from lying on your back to sitting on the side of a flat bed without using  bedrails? 3  Help needed moving to and from a bed to a chair (including a wheelchair)? 3  Help needed standing up from a chair using your arms (e.g., wheelchair or bedside chair)? 3  Help needed to walk in hospital room? 3  Help needed climbing 3-5 steps with a railing?  2  6 Click Score 17  Consider Recommendation of Discharge To: Home with [facility]  PT Goal Progression  Progress towards PT goals Progressing toward goals  Acute Rehab PT Goals  Time For Goal Achievement 05/21/19  Potential to Achieve Goals Good  PT Time Calculation  PT Start Time (ACUTE ONLY) 1643  PT Stop Time (ACUTE ONLY) 1706  PT Time Calculation (min) (ACUTE ONLY) 23 min  PT General Charges  $$ ACUTE PT VISIT 1 Visit  PT Treatments  $Gait Training 8-22 mins  $Self Care/Home Management 8-22    Barry Brunner, PT Pager [phone]

## 2019-05-07 NOTE — Evaluation (Signed)
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  She also previously been fused from L3-L5 which look to be solid. Admitted 05/06/19 for Posterior lateral fusion L2-3 with removal hardware L3-5 and replace hardware at L3. PMH-PAF, HTN, arthritis back, knees; osteopenia, urinary urgency   Clinical Impression   Pt admitted with above. She demonstrates the below listed deficits and will benefit from continued OT to maximize safety and independence with BADLs.  Pt seen in conjunction with PT.  Eval limited to EOB activity due to orthostasis - BP supine 102/57, seated EOB after 5 mins 85/57 (pt symptomatic), supine 102/57.  She currently requires mod - max A for ADLs.  She reports she lives with spouse and was fully independent PTA.  Will follow acutely - anticipate good progress.       Follow Up Recommendations  No OT follow up;Supervision/Assistance - 24 hour    Equipment Recommendations  None recommended by OT    Recommendations for Other Services       Precautions / Restrictions Precautions Precautions: Back Precaution Booklet Issued: Yes (comment) Precaution Comments: required vc to avoid twisting Required Braces or Orthoses: (no back brace) Restrictions Weight Bearing Restrictions: No      Mobility Bed Mobility Overal bed mobility: Needs Assistance Bed Mobility: Rolling;Sidelying to Sit;Sit to Sidelying Rolling: Min assist Sidelying to sit: Min assist     Sit to sidelying: Min assist General bed mobility comments: verbal cues for back precustions and log rolling technique.  Assist to lift trunk from bed and to lift LEs back onto bed   Transfers                 General transfer comment: unable to  attempt due to orthostatic hypotension     Balance Overall balance assessment: Needs assistance Sitting-balance support: Feet supported Sitting balance-Leahy Scale: Fair Sitting balance - Comments: able to maintain static sitting                                    ADL either performed or assessed with clinical judgement   ADL Overall ADL's : Needs assistance/impaired Eating/Feeding: Independent   Grooming: Wash/dry hands;Wash/dry face;Oral care;Brushing hair;Set up;Sitting   Upper Body Bathing: Minimal assistance;Sitting   Lower Body Bathing: Maximal assistance;Sit to/from stand   Upper Body Dressing : Moderate assistance;Sitting   Lower Body Dressing: Total assistance;Sit to/from stand   Toilet Transfer: Total assistance Toilet Transfer Details (indicate cue type and reason): unable to attempt due to orthostatic hypotension  Toileting- Clothing Manipulation and Hygiene: Total assistance         General ADL Comments: ADLs limited by orthostatic hypotension      Vision         Perception     Praxis      Pertinent Vitals/Pain Pain Assessment: Faces Faces Pain Scale: Hurts little more Pain Location: back  Pain Descriptors / Indicators: Grimacing Pain Intervention(s): Limited activity within patient's tolerance;Monitored during session;Premedicated before session;Repositioned     Hand Dominance Right   Extremity/Trunk Assessment Upper Extremity Assessment Upper Extremity Assessment: Defer to OT evaluation   Lower Extremity Assessment Lower Extremity Assessment: RLE deficits/detail RLE Deficits / Details: reports numbness at ball of foot  is new and MD aware   Cervical / Trunk Assessment Cervical / Trunk Assessment: Other exceptions Cervical / Trunk Exceptions: s/p lumbar fusion    Communication Communication Communication: No difficulties   Cognition Arousal/Alertness: Awake/alert Behavior During Therapy: WFL for tasks  assessed/performed Overall Cognitive Status: No family/caregiver present to determine baseline cognitive functioning                                 General Comments: Pt is a bit tangential and confusing details of info.  Unsure if this is pt baseline or if secondary to medications    General Comments  BP supine 97/53; sitting after 5 mins 85/57 (pt symptomatic); supine 102/57 - RN notified     Exercises Exercises: Other exercises Other Exercises Other Exercises: seated ankle and hand pumps, elbow flexion attempting to improve BP   Shoulder Instructions      Home Living Family/patient expects to be discharged to:: Private residence Living Arrangements: Spouse/significant other Available Help at Discharge: Family;Available PRN/intermittently(husband; can be there as much as needed) Type of Home: House Home Access: Stairs to enter Entrance Stairs-Number of Steps: 11 Entrance Stairs-Rails: Right;Left Home Layout: Two level;Laundry or work area in basement     SunGardBathroom Shower/Tub: Producer, television/film/videoWalk-in shower   Bathroom Toilet: Standard     Home Equipment: Gilmer MorCane - single point;Shower seat;Bedside commode;Grab bars - tub/shower;Walker - 2 wheels;Adaptive equipment Adaptive Equipment: Reacher(thinks it's too old and needs to be replaced)        Prior Functioning/Environment Level of Independence: Independent        Comments: retired Oncologistrehab RN         OT Problem List: Decreased strength;Decreased activity tolerance;Impaired balance (sitting and/or standing);Decreased cognition;Decreased safety awareness;Decreased knowledge of use of DME or AE;Decreased knowledge of precautions;Pain      OT Treatment/Interventions: Self-care/ADL training;DME and/or AE instruction;Therapeutic activities;Patient/family education;Balance training    OT Goals(Current goals can be found in the care plan section) Acute Rehab OT Goals Patient Stated Goal: "I need to get out of this bed and get  moving"  OT Goal Formulation: With patient Time For Goal Achievement: 05/21/19 Potential to Achieve Goals: Good ADL Goals Pt Will Perform Grooming: with min guard assist;standing Pt Will Perform Lower Body Bathing: with min guard assist;sit to/from stand;with adaptive equipment Pt Will Perform Lower Body Dressing: with min guard assist;sit to/from stand Pt Will Transfer to Toilet: with min guard assist;ambulating;bedside commode;grab bars;regular height toilet Pt Will Perform Toileting - Clothing Manipulation and hygiene: with min guard assist;sit to/from stand Pt Will Perform Tub/Shower Transfer: Shower transfer;with min guard assist;ambulating;shower seat;3 in 1;rolling walker;grab bars  OT Frequency: Min 2X/week   Barriers to D/C:            Co-evaluation PT/OT/SLP Co-Evaluation/Treatment: Yes Reason for Co-Treatment: For patient/therapist safety PT goals addressed during session: Mobility/safety with mobility OT goals addressed during session: ADL's and self-care      AM-PAC OT "6 Clicks" Daily Activity     Outcome Measure Help from another person eating meals?: None Help from another person taking care of personal grooming?: A Little Help from another person toileting, which includes using toliet, bedpan, or urinal?: A Lot Help from another person bathing (including washing, rinsing, drying)?: A Lot Help from another person to put on and taking off regular upper body clothing?: A Lot Help from another person to put on and taking off regular lower body clothing?: Total 6 Click  Score: 14   End of Session Nurse Communication: Mobility status;Other (comment)(BP )  Activity Tolerance: Treatment limited secondary to medical complications (Comment) Patient left: in bed;with call bell/phone within reach;with bed alarm set  OT Visit Diagnosis: Unsteadiness on feet (R26.81);Pain Pain - part of body: (back )                Time: 3532-9924 OT Time Calculation (min): 30  min Charges:  OT General Charges $OT Visit: 1 Visit OT Evaluation $OT Eval Moderate Complexity: 1 Mod  Lucille Passy, OTR/L Acute Rehabilitation Services Pager [phone] Office [phone]   Lucille Passy M 05/07/2019, 3:16 PM

## 2019-05-08 NOTE — Progress Notes (Signed)
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  She also previously been fused from L3-L5 which look to be solid. Admitted 05/06/19 for Posterior lateral fusion L2-3 with removal hardware L3-5 and replace hardware at L3. Post-op limited by hypotension, N/V  PMH-PAF, HTN, arthritis back, knees; osteopenia, urinary urgency    PT Comments    Patient up in chair on arrival. Reports she continues to be slightly dizzy and nauseated. Recently had nausea meds. BPs better throughout session, however increasing nausea with ambulation therefore limited to short bouts in her room. She reports she always has these issues after surgery/anesthesia. Continued education on back precautions, including not elevating legs in recliner due to causing "slouch sitting" (flexion). Provided blankets under her feet due to unable to touch the floor. Pt reported this did feel better.     Follow Up Recommendations  No PT follow up;Supervision/Assistance - 24 hour(initial few days 24/7 due to ?cognition)     Equipment Recommendations  None recommended by PT    Recommendations for Other Services       Precautions / Restrictions Precautions Precautions: Back Precaution Booklet Issued: Yes (comment) Precaution Comments: able to describe 2 of 3 precautions; continued to educate Required Braces or Orthoses: (no back brace) Restrictions Weight Bearing Restrictions: No    Mobility  Bed Mobility                  Transfers Overall transfer level: Needs assistance Equipment used: Rolling walker (2 wheeled) Transfers: Sit to/from Stand Sit to Stand: Min guard         General transfer comment: from recliner,  toilet  Ambulation/Gait Ambulation/Gait assistance: Min assist Gait Distance (Feet): 45 Feet(15 to toilet; then 45 feet) Assistive device: Rolling walker (2 wheeled) Gait Pattern/deviations: Step-through pattern;Decreased stride length;Wide base of support Gait velocity: decr   General Gait Details: agreed to RW as remains lightheaded despite improved BPs; required educ re: safe use, proper positioning for upright posture   Stairs             Wheelchair Mobility    Modified Rankin (Stroke Patients Only)       Balance Overall balance assessment: Needs assistance Sitting-balance support: Feet supported Sitting balance-Leahy Scale: Fair Sitting balance - Comments: able to maintain static sitting    Standing balance support: No upper extremity supported Standing balance-Leahy Scale: Fair Standing balance comment: static standing at sink                            Cognition Arousal/Alertness: Awake/alert Behavior During Therapy: WFL for tasks assessed/performed Overall Cognitive Status: Within Functional Limits for tasks assessed                                        Exercises      General Comments General comments (skin integrity, edema, etc.): BP monitored sitting, standing and after walking; all 100-110s/60-70 with MAP 72      Pertinent Vitals/Pain Pain Assessment: 0-10 Pain Score: 5  Pain Location: back  Pain Descriptors / Indicators: Operative site guarding Pain Intervention(s): Limited activity within patient's tolerance;Monitored during session;Premedicated before session;Repositioned  Home Living                      Prior Function            PT Goals (current goals can now be found in the care plan section) Acute Rehab PT Goals Patient Stated Goal: "I need to get out of this bed and get moving"  Time For Goal Achievement: 05/21/19 Potential to Achieve Goals: Good Progress towards PT goals: Progressing  toward goals    Frequency    Min 5X/week      PT Plan Current plan remains appropriate    Co-evaluation              AM-PAC PT "6 Clicks" Mobility   Outcome Measure  Help needed turning from your back to your side while in a flat bed without using bedrails?: A Little Help needed moving from lying on your back to sitting on the side of a flat bed without using bedrails?: A Little Help needed moving to and from a bed to a chair (including a wheelchair)?: A Little Help needed standing up from a chair using your arms (e.g., wheelchair or bedside chair)?: A Little Help needed to walk in hospital room?: A Little Help needed climbing 3-5 steps with a railing? : A Lot 6 Click Score: 17    End of Session Equipment Utilized During Treatment: Gait belt Activity Tolerance: Treatment limited secondary to medical complications (Comment)(dizziness; nausea) Patient left: with call bell/phone within reach;in chair Nurse Communication: Mobility status PT Visit Diagnosis: Other abnormalities of gait and mobility (R26.89);Pain Pain - part of body: (back)     Time: 5397-6734 PT Time Calculation (min) (ACUTE ONLY): 31 min  Charges:  $Gait Training: 23-37 mins                      Veda Canning, PT Pager [phone]    Zena Amos 05/08/2019, 1:51 PM

## 2019-05-08 NOTE — Progress Notes (Signed)
   Providing Compassionate, Quality Care - Together   Subjective: Patient reports her pain is much better controlled this morning. She has been requiring phenergan for nausea/vomiting. She reports no bowel movement since surgery. She also reports some perineal numbness. She has had a couple of episodes of incontinence with ambulation.  Objective: Vital signs in last 24 hours: Temp:  [97.7 F (36.5 C)-98.6 F (37 C)] 98.2 F (36.8 C) (11/15 0745) Pulse Rate:  [58-71] 70 (11/15 0745) Resp:  [16-17] 16 (11/15 0745) BP: (85-116)/(53-66) 113/63 (11/15 0745) SpO2:  [94 %-100 %] 99 % (11/15 0745)  Intake/Output from previous day: No intake/output data recorded. Intake/Output this shift: No intake/output data recorded.  Alert and oriented x 4 PERRLA CN II-XII grossly intact MAE, Strength intact Mild numbness in right foot, specifically at her toes Incision is covered with Honeycomb dressing and Steri Strips; Dressing is clean, dry, and intact  Lab Results: Recent Labs    05/05/19 1000  WBC 8.8  HGB 12.8  HCT 40.3  PLT 240   BMET Recent Labs    05/05/19 1000  NA 139  K 4.1  CL 104  CO2 26  GLUCOSE 97  BUN 20  CREATININE 0.83  CALCIUM 8.8*    Studies/Results: Dg Lumbar Spine 2-3 Views  Result Date: 05/06/2019 CLINICAL DATA:  Posterolateral fusion. EXAM: DG C-ARM 1-60 MIN; LUMBAR SPINE - 2-3 VIEW CONTRAST:  No prior. FLUOROSCOPY TIME:  Fluoroscopy Time:  0 minutes 35 seconds Number of Acquired Spot Images: 2 COMPARISON:  CT 08/26/2018. FINDINGS: Lateral fusion lower lumbar spine. Hardware intact. Posterior pedicle screws noted and are intact. No acute bony abnormality. Bony alignment. IMPRESSION: Postsurgical changes lower lumbar spine. Hardware intact. Bony alignment. Electronically Signed   By: Marcello Moores  Register   On: 05/06/2019 13:23   Dg C-arm 1-60 Min  Result Date: 05/06/2019 CLINICAL DATA:  Posterolateral fusion. EXAM: DG C-ARM 1-60 MIN; LUMBAR SPINE - 2-3 VIEW  CONTRAST:  No prior. FLUOROSCOPY TIME:  Fluoroscopy Time:  0 minutes 35 seconds Number of Acquired Spot Images: 2 COMPARISON:  CT 08/26/2018. FINDINGS: Lateral fusion lower lumbar spine. Hardware intact. Posterior pedicle screws noted and are intact. No acute bony abnormality. Bony alignment. IMPRESSION: Postsurgical changes lower lumbar spine. Hardware intact. Bony alignment. Electronically Signed   By: Marcello Moores  Register   On: 05/06/2019 13:23    Assessment/Plan: Patient is two days status post posterior lateral fusion, with placement of bilateral L2 cortical screws and exploration of fusion. Dr. Saintclair Halsted removed the hardware at L3-L5, with replacement of the L3 screws with posterior lateral arthrodesis.   LOS: 2 days    -Continue to mobilize -Obtain post void residuals   Viona Gilmore, DNP, AGNP-C Nurse Practitioner  [facility] [place] [facility]. [address], [place], [place] [address] P: [phone]    F: [phone]  05/08/2019, 8:36 AM

## 2019-05-09 NOTE — Progress Notes (Signed)
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  She also previously been fused from L3-L5 which look to be solid. Admitted 05/06/19 for Posterior lateral fusion L2-3 with removal hardware L3-5 and replace hardware at L3. Post-op limited by hypotension, N/V  PMH-PAF, HTN, arthritis back, knees; osteopenia, urinary urgency   OT comments  This 73 yo female admitted and underwent above presents to acute OT with making progress with mobility and lower body ADLs with AE and has the AE at home. She still needs VCs intermittently to follow back precautions with bed mobility. She will continue to benefit from acute OT without need for follow up.  Follow Up Recommendations  No OT follow up;Supervision/Assistance - 24 hour    Equipment Recommendations  None recommended by OT       Precautions / Restrictions Precautions Precautions: Back Precaution Comments: pt able to recall precautions but requires verbal cues to adhere during function-- pt tends to sit straight up in bed (long sitting v. log rolling) Required Braces or Orthoses: (orders state "No brace needed" however pt was fitted for a brace pta--I recommended she use it if it makes her feel better when she is up on her feet or sitting for a period of time)       Mobility Bed Mobility Overal bed mobility: Needs Assistance Bed Mobility: Rolling;Sidelying to Sit;Sit to Sidelying Rolling: Min guard Sidelying to sit: Min guard     Sit to sidelying: Min guard General bed mobility comments: VCs for technique coming out on left hand side of bed (the side she gets out of at home)     Balance Overall balance assessment: Needs assistance Sitting-balance support: No upper extremity  supported;Feet supported Sitting balance-Leahy Scale: Good     Standing balance support: No upper extremity supported Standing balance-Leahy Scale: Fair                             ADL either performed or assessed with clinical judgement   ADL Overall ADL's : Needs assistance/impaired                       Lower Body Dressing Details (indicate cue type and reason): Pt able to use reacher to doff socks and donn/doff underwear, min guard A sit<>stand   Toilet Transfer Details (indicate cue type and reason): Pt sit<>stand from bed min guard A>ambulated in hallway with RW with minguard A>back to sit EOB                 Vision Patient Visual Report: No change from baseline            Cognition Arousal/Alertness: Awake/alert Behavior During Therapy: WFL for tasks assessed/performed Overall Cognitive Status: Within Functional Limits for tasks assessed                                                     Pertinent Vitals/ Pain       Pain Assessment: Faces Faces Pain Scale: Hurts a little bit Pain Location: back Pain Descriptors / Indicators: Operative site  guarding Pain Intervention(s): Monitored during session         Frequency  Min 2X/week        Progress Toward Goals  OT Goals(current goals can now be found in the care plan section)  Progress towards OT goals: Progressing toward goals     Plan Discharge plan remains appropriate       AM-PAC OT "6 Clicks" Daily Activity     Outcome Measure   Help from another person eating meals?: None Help from another person taking care of personal grooming?: A Little Help from another person toileting, which includes using toliet, bedpan, or urinal?: A Little Help from another person bathing (including washing, rinsing, drying)?: A Little Help from another person to put on and taking off regular upper body clothing?: A Little Help from another person to put on and taking off  regular lower body clothing?: A Little 6 Click Score: 19    End of Session Equipment Utilized During Treatment: Gait belt;Rolling walker;Back brace  OT Visit Diagnosis: Unsteadiness on feet (R26.81);Pain Pain - part of body: (operative site)   Activity Tolerance Patient tolerated treatment well   Patient Left in bed;with call bell/phone within reach   Nurse Communication          Time: 1610-9604 OT Time Calculation (min): 32 min  Charges: OT General Charges $OT Visit: 1 Visit OT Treatments $Self Care/Home Management : 23-37 mins  Ignacia Palma, OTR/L Acute [employer] Pager [phone] Office [phone]      [name] 05/09/2019, 5:06 PM

## 2019-05-09 NOTE — Progress Notes (Signed)
Subjective: Patient reports Patient doing well significant provement pain she denies any numbness in her groin she does have a bit of numbness of her right great toe she feels like the urination is improving post void residual she said was fine she was not retaining yesterday she does has difficulty knowing when she has to go.  Objective: Vital signs in last 24 hours: Temp:  [97.6 F (36.4 C)-98.5 F (36.9 C)] 98.1 F (36.7 C) (11/16 1147) Pulse Rate:  [56-69] 61 (11/16 0803) Resp:  [16-18] 16 (11/16 0737) BP: (112-139)/(61-87) 139/67 (11/16 1147) SpO2:  [97 %-99 %] 99 % (11/16 0737)  Intake/Output from previous day: No intake/output data recorded. Intake/Output this shift: Total I/O In: 120 [P.O.:120] Out: -   Strength is 5/5 lower extremities incision clean dry and intact  Lab Results: No results for input(s): WBC, HGB, HCT, PLT in the last 72 hours. BMET No results for input(s): NA, K, CL, CO2, GLUCOSE, BUN, CREATININE, CALCIUM in the last 72 hours.  Studies/Results: No results found.  Assessment/Plan: Postop day 3 posterior lumbar fusion making good progress I think she is having some incontinent episodes related to her medications but she is much better today the sensation is much better she is voiding much better pain is much better we will keep her 1 more day and she will go home tomorrow.  LOS: 3 days     Saatvik Thielman P 05/09/2019, 1:50 PM

## 2019-05-09 NOTE — Progress Notes (Signed)
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  She also previously been fused from L3-L5 which look to be solid. Admitted 05/06/19 for Posterior lateral fusion L2-3 with removal hardware L3-5 and replace hardware at L3. Post-op limited by hypotension, N/V  PMH-PAF, HTN, arthritis back, knees; osteopenia, urinary urgency    PT Comments    Pt continues to be limited by onset of dizziness and weakness with ambulation at about every 50'. Pt requiring seated rest breaks and it dissipates. Pt reports this to be her 6th back surgery and she has a very supportive husband that doesn't let her walk alone. Unable to complete stair negotiation today due to safety with onset of dizziness. Pt reports, "My husband will be there." Pt educated again on safety with mobility due to dizziness as well as back precautions as pt not adhering to functionally despite being able to recall them.   Follow Up Recommendations  No PT follow up;Supervision/Assistance - 24 hour     Equipment Recommendations  None recommended by PT    Recommendations for Other Services       Precautions / Restrictions Precautions Precautions: Back Precaution Booklet Issued: Yes (comment) Precaution Comments: pt able to recall precautions but requires verbal cues to adhere during function, ie pulls her self up into long sit vs log roll Restrictions Weight Bearing Restrictions: No    Mobility  Bed Mobility Overal bed mobility: Needs Assistance Bed Mobility: Supine to Sit     Supine to sit: Supervision     General bed mobility comments: pt brought self into long sit despite verbal cues for log rolling to adhere to back precautions  Transfers Overall transfer level:  Needs assistance Equipment used: Rolling walker (2 wheeled) Transfers: Sit to/from Stand Sit to Stand: Min guard         General transfer comment: verbal cues for hand placement and safety, no physical assist needed  Ambulation/Gait Ambulation/Gait assistance: Min guard Gait Distance (Feet): 50 Feet(x3) Assistive device: Rolling walker (2 wheeled) Gait Pattern/deviations: Step-to pattern;Decreased step length - right;Decreased stance time - right     General Gait Details: every 46' pt stopped and reported weakness and the "feeling you get right before you pass out" requiring seated rest breaks. Attempted to take BP but took several minutes BP was 112/57. , pt also with step to pattern leading with L LE stating "my R LE is bad on so many levels" educated pt to step with R LE first to off weight it and encouraged step through gait pattern with smaller step length, pt able to tolerate but ultimately limited by onset of dizziness and weakness   Stairs Stairs: (unable to complete due to freq episodes of dizziness)           Wheelchair Mobility    Modified Rankin (Stroke Patients Only)       Balance Overall balance assessment: Needs assistance Sitting-balance support: Feet supported Sitting balance-Leahy Scale: Good     Standing balance support: No upper extremity supported Standing balance-Leahy Scale: Fair Standing balance comment: pt okay with static standing but needs RW for amb                            Cognition Arousal/Alertness: Awake/alert  Behavior During Therapy: WFL for tasks assessed/performed Overall Cognitive Status: Within Functional Limits for tasks assessed                                        Exercises      General Comments General comments (skin integrity, edema, etc.): VSS      Pertinent Vitals/Pain Pain Assessment: 0-10 Pain Score: 4  Pain Location: back Pain Descriptors / Indicators: Operative site  guarding Pain Intervention(s): Monitored during session    Home Living                      Prior Function            PT Goals (current goals can now be found in the care plan section) Progress towards PT goals: Not progressing toward goals - comment(limited by dizziness)    Frequency    Min 5X/week      PT Plan Current plan remains appropriate    Co-evaluation              AM-PAC PT "6 Clicks" Mobility   Outcome Measure  Help needed turning from your back to your side while in a flat bed without using bedrails?: A Little Help needed moving from lying on your back to sitting on the side of a flat bed without using bedrails?: A Little Help needed moving to and from a bed to a chair (including a wheelchair)?: A Little Help needed standing up from a chair using your arms (e.g., wheelchair or bedside chair)?: A Little Help needed to walk in hospital room?: A Little Help needed climbing 3-5 steps with a railing? : A Lot 6 Click Score: 17    End of Session Equipment Utilized During Treatment: Gait belt Activity Tolerance: Treatment limited secondary to medical complications (Comment) Patient left: in chair;with call bell/phone within reach;with chair alarm set Nurse Communication: Mobility status PT Visit Diagnosis: Other abnormalities of gait and mobility (R26.89);Pain     Time: 5102-5852 PT Time Calculation (min) (ACUTE ONLY): 24 min  Charges:  $Gait Training: 23-37 mins                     Kittie Plater, PT, DPT Acute Rehabilitation Services Pager #: [phone] Office #: [phone]    Berline Lopes 05/09/2019, 8:49 AM

## 2019-05-10 MED ORDER — TIZANIDINE HCL 2 MG PO TABS: 2.0000 mg | ORAL_TABLET | Freq: Four times a day (QID) | ORAL | 0 refills | Status: DC | PRN

## 2019-05-10 MED ORDER — OXYCODONE-ACETAMINOPHEN 5-325 MG PO TABS: 1.0000 | ORAL_TABLET | ORAL | 0 refills | Status: DC | PRN

## 2019-05-10 MED ORDER — ONDANSETRON HCL 4 MG PO TABS: 4.0000 mg | ORAL_TABLET | Freq: Every day | ORAL | 1 refills | Status: DC | PRN

## 2019-05-10 NOTE — Progress Notes (Addendum)
Pt received discharge to home orders this am. PT saw patient and she became "woozy" and her hands clammy. Vital signs stable.Heart rate regular. Unable to test with stairs and patient has 11 steps to her house. Notified PA and held patient to try again later. Without pain meds patient walked about 150 feet before having to sit down with similar s/s but not as severe. Returned to room, asked for pain medication. Given Ultram and Tylenol. Patient asked to be discharged. Notified PA and earlier discharge re-approved. To car in wheelchair, with AVS. Husband picked her up. Simmie Davies RN

## 2019-05-10 NOTE — Discharge Summary (Signed)
Physician Discharge Summary  Patient ID: Melissa Jordan MRN: [id number] DOB/AGE: 1946/03/07 73 y.o.  Admit date: 05/06/2019 Discharge date: 05/10/2019  Admission Diagnoses: pseudoarthrosis L2-3   Discharge Diagnoses: same   Discharged Condition: good  Hospital Course: The patient was admitted on 05/06/2019 and taken to the operating room where the patient underwent posterior lateral fusion L2-3. The patient tolerated the procedure well and was taken to the recovery room and then to the floor in stable condition. The hospital course was routine. There were no complications. The wound remained clean dry and intact. Pt had appropriate back soreness. No complaints of leg pain or new N/T/W. The patient remained afebrile with stable vital signs, and tolerated a regular diet. The patient continued to increase activities, and pain was well controlled with oral pain medications.   Consults: None  Significant Diagnostic Studies:  Results for orders placed or performed during the hospital encounter of 05/05/19  SARS CORONAVIRUS 2 (TAT 6-24 HRS) Nasopharyngeal Nasopharyngeal Swab   Specimen: Nasopharyngeal Swab  Result Value Ref Range   SARS Coronavirus 2 NEGATIVE NEGATIVE    Dg Lumbar Spine 2-3 Views  Result Date: 05/06/2019 CLINICAL DATA:  Posterolateral fusion. EXAM: DG C-ARM 1-60 MIN; LUMBAR SPINE - 2-3 VIEW CONTRAST:  No prior. FLUOROSCOPY TIME:  Fluoroscopy Time:  0 minutes 35 seconds Number of Acquired Spot Images: 2 COMPARISON:  CT 08/26/2018. FINDINGS: Lateral fusion lower lumbar spine. Hardware intact. Posterior pedicle screws noted and are intact. No acute bony abnormality. Bony alignment. IMPRESSION: Postsurgical changes lower lumbar spine. Hardware intact. Bony alignment. Electronically Signed   By: Marcello Moores  Register   On: 05/06/2019 13:23   Dg C-arm 1-60 Min  Result Date: 05/06/2019 CLINICAL DATA:  Posterolateral fusion. EXAM: DG C-ARM 1-60 MIN; LUMBAR SPINE - 2-3 VIEW CONTRAST:   No prior. FLUOROSCOPY TIME:  Fluoroscopy Time:  0 minutes 35 seconds Number of Acquired Spot Images: 2 COMPARISON:  CT 08/26/2018. FINDINGS: Lateral fusion lower lumbar spine. Hardware intact. Posterior pedicle screws noted and are intact. No acute bony abnormality. Bony alignment. IMPRESSION: Postsurgical changes lower lumbar spine. Hardware intact. Bony alignment. Electronically Signed   By: Marcello Moores  Register   On: 05/06/2019 13:23    Antibiotics:  Anti-infectives (From admission, onward)   Start     Dose/Rate Route Frequency Ordered Stop   05/06/19 1530  ceFAZolin (ANCEF) IVPB 2g/100 mL premix     2 g 200 mL/hr over 30 Minutes Intravenous Every 8 hours 05/06/19 1522 05/08/19 1257   05/06/19 1305  bacitracin 50,000 Units in sodium chloride 0.9 % 500 mL irrigation  Status:  Discontinued       As needed 05/06/19 1306 05/06/19 1407   05/06/19 0930  vancomycin (VANCOCIN) IVPB 1000 mg/200 mL premix     1,000 mg 200 mL/hr over 60 Minutes Intravenous On call to O.R. 05/06/19 0915 05/06/19 1025      Discharge Exam: Blood pressure 129/67, pulse 72, temperature 99.3 F (37.4 C), temperature source Oral, resp. rate 16, height 5\' 3"  (1.6 m), weight 68.9 kg, SpO2 99 %. Neurologic: Grossly normal Ambulating and voiding well  Discharge Medications:   Allergies as of 05/10/2019      Reactions   Lyrica [pregabalin] Palpitations, Other (See Comments)   "thought I was going to have a heart attack; heart started pounding so hard, I couldn't catch my breath" (06/28/2012)   Meclizine Other (See Comments)   "what was in my mind to say wasn't what came out to say; I was kind  of in another world" (06/28/2012)   Penicillins Itching, Rash   Has patient had a PCN reaction causing immediate rash, facial/tongue/throat swelling, SOB or lightheadedness with hypotension: No Has patient had a PCN reaction causing severe rash involving mucus membranes or skin necrosis: No Has patient had a PCN reaction that required  hospitalization No Has patient had a PCN reaction occurring within the last 10 years:   # # YES # #  If all of the above answers are "NO", then may proceed with Cephalosporin use.   Poison Sumac Extract Swelling, Rash, Other (See Comments)   Blisters   Bactrim [sulfamethoxazole-trimethoprim] Rash   Codeine Nausea Only   Morphine Sulfate Rash   Propoxyphene N-acetaminophen Nausea And Vomiting   "Darvocet"      Medication List    TAKE these medications   acetaminophen 500 MG tablet Commonly known as: TYLENOL Take 1,000 mg by mouth 2 (two) times daily as needed. For pain   amiodarone 200 MG tablet Commonly known as: Pacerone Take 0.5 tablets (100 mg total) by mouth daily. What changed: when to take this   Biotin 5000 MCG Caps Take 5,000 mcg by mouth daily.   CALCIUM-VITAMIN D PO Take 1 tablet by mouth daily.   cholecalciferol 1000 units tablet Commonly known as: VITAMIN D Take 1,000 Units by mouth daily.   Eliquis 5 MG Tabs tablet Generic drug: apixaban Take 5 mg by mouth every evening.   estradiol 1 MG tablet Commonly known as: ESTRACE Take 1 tablet by mouth once daily   fluticasone 50 MCG/ACT nasal spray Commonly known as: FLONASE Place 2 sprays into both nostrils daily as needed for allergies or rhinitis.   hydrOXYzine 25 MG tablet Commonly known as: ATARAX/VISTARIL Take 1 tablet (25 mg total) by mouth every 6 (six) hours.   levothyroxine 75 MCG tablet Commonly known as: SYNTHROID TAKE 1 TABLET BY MOUTH  DAILY   loratadine 10 MG tablet Commonly known as: CLARITIN Take 10 mg by mouth daily as needed for allergies.   losartan 50 MG tablet Commonly known as: COZAAR Take 50 mg by mouth daily.   metoprolol tartrate 50 MG tablet Commonly known as: LOPRESSOR Take 25 mg by mouth 2 (two) times daily.   ondansetron 4 MG tablet Commonly known as: Zofran Take 1 tablet (4 mg total) by mouth daily as needed for nausea or vomiting.   oxyCODONE-acetaminophen  5-325 MG tablet Commonly known as: Percocet Take 1-2 tablets by mouth every 4 (four) hours as needed for severe pain.   rOPINIRole 1 MG tablet Commonly known as: Requip Take 1 tablet (1 mg total) by mouth at bedtime.   tiZANidine 2 MG tablet Commonly known as: ZANAFLEX Take 1 tablet (2 mg total) by mouth every 6 (six) hours as needed for muscle spasms.   traMADol 50 MG tablet Commonly known as: ULTRAM 1 po q 8-12 hrs prn pain What changed:   how much to take  how to take this  when to take this  reasons to take this  additional instructions       Disposition: home   Final Dx:  Posterior lateral fusion L2-3  Discharge Instructions    Call MD for:  difficulty breathing, headache or visual disturbances   Complete by: As directed    Call MD for:  hives   Complete by: As directed    Call MD for:  persistant dizziness or light-headedness   Complete by: As directed    Call MD for:  persistant  nausea and vomiting   Complete by: As directed    Call MD for:  redness, tenderness, or signs of infection (pain, swelling, redness, odor or green/yellow discharge around incision site)   Complete by: As directed    Call MD for:  severe uncontrolled pain   Complete by: As directed    Call MD for:  temperature >100.4   Complete by: As directed    Diet - low sodium heart healthy   Complete by: As directed    Driving Restrictions   Complete by: As directed    No driving for 2 weeks, no riding in the car for 1 week   Increase activity slowly   Complete by: As directed    Remove dressing in 24 hours   Complete by: As directed          Signed: Tiana LoftKimberly Hannah Mahsa Hanser 05/10/2019, 8:14 AM

## 2019-05-10 NOTE — Progress Notes (Signed)
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  She also previously been fused from L3-L5 which look to be solid. Admitted 05/06/19 for Posterior lateral fusion L2-3 with removal hardware L3-5 and replace hardware at L3. Post-op limited by hypotension, N/V  PMH-PAF, HTN, arthritis back, knees; osteopenia, urinary urgency    PT Comments    Unable to progress ambulation or stair negotiation again today due to pts report "my palms are sweaty, I feel weak, I'm going to fall out". Pt has 11 stairs to get into house, unsure if patient is safe to complete them with her frequent need to stop and sit. VSS. Acute PT to cont to follow and attempt to progress mobility.    Follow Up Recommendations  No PT follow up;Supervision/Assistance - 24 hour     Equipment Recommendations  None recommended by PT    Recommendations for Other Services       Precautions / Restrictions Precautions Precautions: Back Precaution Booklet Issued: Yes (comment) Precaution Comments: pt able to recall precautions but requires verbal cues to adhere during function-- pt tends to sit straight up in bed (long sitting v. log rolling) Required Braces or Orthoses: Spinal Brace Spinal Brace: Applied in standing position;Lumbar corset(order says no brace however she has one) Restrictions Weight Bearing Restrictions: No    Mobility  Bed Mobility Overal bed mobility: Needs Assistance Bed Mobility: Rolling;Sidelying to Sit Rolling: Supervision Sidelying to sit: Supervision       General bed mobility comments: v/c's for technique, pt cont to pull self up into long sit despite verbal cues and re-education on back precautions  Transfers Overall transfer level:  Needs assistance Equipment used: Rolling walker (2 wheeled) Transfers: Sit to/from Stand Sit to Stand: Min guard         General transfer comment: verbal cues for hand placement and safety, no physical assist needed  Ambulation/Gait Ambulation/Gait assistance: Min guard Gait Distance (Feet): 60 Feet(x1, 5x1, 10x1) Assistive device: Rolling walker (2 wheeled) Gait Pattern/deviations: Step-to pattern;Decreased step length - right;Decreased stance time - right Gait velocity: decreased   General Gait Details: pt amb 35' and stated "my palms are sweaty, I just feel so weak" Pt required seated rest break. BP 117/68, SPO2 at 98% HR 68, pt began 2nd attempt and stated "I"m going to fall over" VSS, HR in 70s insteady of 60s, attempted again pt amb 10' prior to needing to sit, BP 119/68, HR in 80s, SPO2 98% on RA. RN present and aware   Stairs Stairs: (unable due to pts sudden onset of weakness)           Wheelchair Mobility    Modified Rankin (Stroke Patients Only)       Balance Overall balance assessment: Needs assistance Sitting-balance support: No upper extremity supported;Feet supported Sitting balance-Leahy Scale: Good Sitting balance - Comments: able to maintain static sitting    Standing balance support: No upper extremity supported Standing balance-Leahy Scale: Fair Standing balance comment: pt okay with static standing but needs RW for amb                            Cognition Arousal/Alertness: Awake/alert Behavior During Therapy: WFL for tasks assessed/performed Overall Cognitive Status: Within Functional Limits for tasks  assessed                                 General Comments: pt seems a little anxious however she denies being anxious      Exercises      General Comments General comments (skin integrity, edema, etc.): VSS      Pertinent Vitals/Pain Pain Assessment: Faces Faces Pain Scale: Hurts a little bit Pain Location:  back Pain Descriptors / Indicators: Sore Pain Intervention(s): Monitored during session    Home Living                      Prior Function            PT Goals (current goals can now be found in the care plan section) Progress towards PT goals: Not progressing toward goals - comment    Frequency    Min 5X/week      PT Plan Current plan remains appropriate    Co-evaluation              AM-PAC PT "6 Clicks" Mobility   Outcome Measure  Help needed turning from your back to your side while in a flat bed without using bedrails?: A Little Help needed moving from lying on your back to sitting on the side of a flat bed without using bedrails?: A Little Help needed moving to and from a bed to a chair (including a wheelchair)?: A Little Help needed standing up from a chair using your arms (e.g., wheelchair or bedside chair)?: A Little Help needed to walk in hospital room?: A Little Help needed climbing 3-5 steps with a railing? : A Lot 6 Click Score: 17    End of Session Equipment Utilized During Treatment: Gait belt;Back brace Activity Tolerance: Treatment limited secondary to medical complications (Comment) Patient left: in chair;with call bell/phone within reach;with chair alarm set Nurse Communication: Mobility status PT Visit Diagnosis: Other abnormalities of gait and mobility (R26.89);Pain     Time: 1043-1110 PT Time Calculation (min) (ACUTE ONLY): 27 min  Charges:  $Gait Training: 23-37 mins                     Kittie Plater, PT, DPT Acute Rehabilitation Services Pager #: [phone] Office #: [phone]    Berline Lopes 05/10/2019, 1:26 PM

## 2019-05-11 ENCOUNTER — Encounter (HOSPITAL_COMMUNITY): Payer: Self-pay | Admitting: Neurosurgery

## 2019-05-13 ENCOUNTER — Ambulatory Visit (INDEPENDENT_AMBULATORY_CARE_PROVIDER_SITE_OTHER): Payer: Medicare Other | Admitting: Family Medicine

## 2019-05-13 ENCOUNTER — Encounter: Payer: Self-pay | Admitting: Family Medicine

## 2019-05-13 ENCOUNTER — Other Ambulatory Visit: Payer: Self-pay

## 2019-05-13 DIAGNOSIS — S51811A Laceration without foreign body of right forearm, initial encounter: Secondary | ICD-10-CM

## 2019-05-13 DIAGNOSIS — S51812A Laceration without foreign body of left forearm, initial encounter: Secondary | ICD-10-CM

## 2019-05-13 MED ORDER — CEPHALEXIN 500 MG PO CAPS: 500.0000 mg | ORAL_CAPSULE | Freq: Three times a day (TID) | ORAL | 0 refills | Status: AC

## 2019-05-13 NOTE — Progress Notes (Signed)
Virtual Visit via Video   I connected with patient on 05/13/19 at  4:00 PM EST by a video enabled telemedicine application and verified that I am speaking with the correct person using two identifiers.  Location patient: Home Location provider: [facility], Office Persons participating in the virtual visit: Patient, Provider, [place] (Jess B)  I discussed the limitations of evaluation and management by telemedicine and the availability of in person appointments. The patient expressed understanding and agreed to proceed.  Subjective:   HPI:   Arm wounds- pt had back surgery last Friday and when she woke up, both arms were wrapped in gauze from elbow to wrists.  The next AM she had them unwrap the bandages and she had multiple skin tears up her forearms.  'no one can tell me what happened'.  Wounds are open, oozing.  She is fearful of infxn.  Oozing is thin, watery.  Skin is sloughing off.  ROS:   See pertinent positives and negatives per HPI.  Patient Active Problem List   Diagnosis Date Noted  . 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 Traumatic arthritis of right ankle   . Pain in right ankle and joints of right foot 08/05/2018  . A-fib ([place]) 03/10/2017  . 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 RLS (restless legs syndrome) 01/11/2015  . Osteopenia 05/15/2014  . Hyperlipidemia 05/24/2013  . Routine general medical examination at a health care facility 03/29/2012  . POSTMENOPAUSAL SYNDROME 09/26/2009  . URINARY URGENCY 09/26/2009  . ANEMIA 01/09/2009  . Back pain of lumbar region with sciatica 01/09/2009  . LAMINECTOMY, LUMBAR, HX OF 08/09/2008  . Hypothyroid 03/03/2007  . HYPERTENSION, BENIGN 03/03/2007  . GERD 02/10/2007    Social History   Tobacco Use  . Smoking status: Never Smoker  . Smokeless tobacco: Never Used  Substance Use Topics  . Alcohol use: No    Current Outpatient Medications:  .  acetaminophen (TYLENOL) 500 MG tablet, Take 1,000 mg by  mouth 2 (two) times daily as needed. For pain, Disp: , Rfl:  .  amiodarone (PACERONE) 200 MG tablet, Take 0.5 tablets (100 mg total) by mouth daily. (Patient taking differently: Take 100 mg by mouth every other day. ), Disp: 30 tablet, Rfl: 3 .  apixaban (ELIQUIS) 5 MG TABS tablet, Take 5 mg by mouth every evening., Disp: , Rfl:  .  Biotin 5000 MCG CAPS, Take 5,000 mcg by mouth daily., Disp: , Rfl:  .  CALCIUM-VITAMIN D PO, Take 1 tablet by mouth daily. , Disp: , Rfl:  .  cholecalciferol (VITAMIN D) 1000 units tablet, Take 1,000 Units by mouth daily., Disp: , Rfl:  .  estradiol (ESTRACE) 1 MG tablet, Take 1 tablet by mouth once daily, Disp: 90 tablet, Rfl: 0 .  fluticasone (FLONASE) 50 MCG/ACT nasal spray, Place 2 sprays into both nostrils daily as needed for allergies or rhinitis. , Disp: , Rfl:  .  hydrOXYzine (ATARAX/VISTARIL) 25 MG tablet, Take 1 tablet (25 mg total) by mouth every 6 (six) hours., Disp: 12 tablet, Rfl: 0 .  levothyroxine (SYNTHROID) 75 MCG tablet, TAKE 1 TABLET BY MOUTH  DAILY, Disp: 90 tablet, Rfl: 1 .  loratadine (CLARITIN) 10 MG tablet, Take 10 mg by mouth daily as needed for allergies., Disp: , Rfl:  .  losartan (COZAAR) 50 MG tablet, Take 50 mg by mouth daily., Disp: , Rfl:  .  metoprolol tartrate (LOPRESSOR) 50 MG tablet, Take 25 mg by mouth 2 (two) times daily. ,  Disp: 180 tablet, Rfl: 3 .  ondansetron (ZOFRAN) 4 MG tablet, Take 1 tablet (4 mg total) by mouth daily as needed for nausea or vomiting., Disp: 30 tablet, Rfl: 1 .  oxyCODONE-acetaminophen (PERCOCET) 5-325 MG tablet, Take 1-2 tablets by mouth every 4 (four) hours as needed for severe pain., Disp: 30 tablet, Rfl: 0 .  rOPINIRole (REQUIP) 1 MG tablet, Take 1 tablet (1 mg total) by mouth at bedtime., Disp: 90 tablet, Rfl: 1 .  tiZANidine (ZANAFLEX) 2 MG tablet, Take 1 tablet (2 mg total) by mouth every 6 (six) hours as needed for muscle spasms., Disp: 30 tablet, Rfl: 0 .  traMADol (ULTRAM) 50 MG tablet, 1 po q 8-12  hrs prn pain (Patient taking differently: Take 50 mg by mouth every 8 (eight) hours as needed (severe back pain.). ), Disp: 30 tablet, Rfl: 0  Allergies  Allergen Reactions  . Lyrica [Pregabalin] Palpitations and Other (See Comments)    "thought I was going to have a heart attack; heart started pounding so hard, I couldn't catch my breath" (06/28/2012)  . Meclizine Other (See Comments)    "what was in my mind to say wasn't what came out to say; I was kind of in another world" (06/28/2012)  . Penicillins Itching and Rash    Has patient had a PCN reaction causing immediate rash, facial/tongue/throat swelling, SOB or lightheadedness with hypotension: No Has patient had a PCN reaction causing severe rash involving mucus membranes or skin necrosis: No Has patient had a PCN reaction that required hospitalization No Has patient had a PCN reaction occurring within the last 10 years:   # # YES # #  If all of the above answers are "NO", then may proceed with Cephalosporin use.   . Poison Sumac Extract Swelling, Rash and Other (See Comments)    Blisters  . Bactrim [Sulfamethoxazole-Trimethoprim] Rash  . Codeine Nausea Only  . Morphine Sulfate Rash  . Propoxyphene N-Acetaminophen Nausea And Vomiting    "Darvocet"    Objective:   There were no vitals taken for this visit.  AAOx3, NAD NCAT, EOMI No obvious CN deficits Coloring WNL Pt is able to speak clearly, coherently without shortness of breath or increased work of breathing.  Thought process is linear.  Mood is appropriate.  Multiple skin tears on forearms bilaterally w/ extensive bruising- almost black in color  Assessment and Plan:   Skin tears- new.  Pt is very distraught at what happened during her procedures and states that no one has been able to give her an answer.  Was able to find 1 nursing note that indicated skin tears happened while they were positioning the pt in a prone position.  She is concerned about possible infection.   Keflex prescription sent in w/ instructions not to start unless her drainage became foul smelling or purulent.  Wound care instructions given.  Pt expressed understanding and is in agreement w/ plan.    Neena Rhymes, MD 05/13/2019

## 2019-05-13 NOTE — Progress Notes (Signed)
I have discussed the procedure for the virtual visit with the patient who has given consent to proceed with assessment and treatment.   Pt unable to obtain vitals.   Shakeel Disney L Emree Locicero, CMA     

## 2019-05-16 ENCOUNTER — Other Ambulatory Visit: Payer: Self-pay | Admitting: *Deleted

## 2019-05-16 NOTE — Patient Outreach (Signed)
[facility]) Care Management  05/16/2019  Melissa Jordan 09/23/1945 [id number]   RED ON EMMI ALERT - General Discharge Day # 1 Date: 05/13/2019 Red Alert Reason: Wound not healing   Outreach attempt #1, successful.  Referral received from care management assistant for above concern as member was recently discharged from hospital after having lumbar surgery.  Per chart, also has history of HTN, A-fib, GERD, HLD, and hypothyroid.   Call placed to member, identity verified.  This care manager introduced self and stated purpose of call.  Report she is doing well with recovering from the surgery but having problems with skin tears on her arm that she got while hospitalized.  State they have been draining and having to be bandaged since discharge.  She had virtual visit with PCP on Friday and was placed on antibiotics in an attempt to be proactive and prevent infection.  She has follow up with surgeon tomorrow.  Lives with her husband but able to perform all ADL's independently as well as manage her own medications.  Denies need for ongoing [facility] involvement but state she will call if needs or condition change.  Plan: RN CM will not open case at this time but will send successful outreach letter with this care manager's contact information.  Valente David, [place], MSN [place] [phone]

## 2019-05-17 DIAGNOSIS — Z6827 Body mass index (BMI) 27.0-27.9, adult: Secondary | ICD-10-CM | POA: Insufficient documentation

## 2019-05-25 ENCOUNTER — Encounter (HOSPITAL_COMMUNITY): Payer: Self-pay | Admitting: Neurosurgery

## 2019-05-26 ENCOUNTER — Other Ambulatory Visit: Payer: Self-pay | Admitting: Family Medicine

## 2019-05-26 DIAGNOSIS — Z1231 Encounter for screening mammogram for malignant neoplasm of breast: Secondary | ICD-10-CM

## 2019-05-31 ENCOUNTER — Encounter: Payer: Self-pay | Admitting: Cardiology

## 2019-05-31 NOTE — Progress Notes (Signed)
Received records from Dr Terrence Dupont, no record of echocardiogram

## 2019-06-09 DIAGNOSIS — S32009K Unspecified fracture of unspecified lumbar vertebra, subsequent encounter for fracture with nonunion: Secondary | ICD-10-CM | POA: Diagnosis not present

## 2019-06-13 ENCOUNTER — Other Ambulatory Visit: Payer: Self-pay | Admitting: Family Medicine
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
## 2019-06-29 ENCOUNTER — Ambulatory Visit: Payer: Medicare Other | Admitting: Cardiology

## 2019-06-29 ENCOUNTER — Other Ambulatory Visit: Payer: Self-pay

## 2019-06-29 ENCOUNTER — Encounter: Payer: Self-pay | Admitting: Cardiology

## 2019-06-29 VITALS — BP 128/66 | HR 59 | Ht 63.0 in | Wt 153.0 lb

## 2019-06-29 DIAGNOSIS — I10 Essential (primary) hypertension: Secondary | ICD-10-CM | POA: Diagnosis not present

## 2019-06-29 DIAGNOSIS — I480 Paroxysmal atrial fibrillation: Secondary | ICD-10-CM

## 2019-06-29 MED ORDER — APIXABAN 5 MG PO TABS: 5.0000 mg | ORAL_TABLET | Freq: Two times a day (BID) | ORAL | 3 refills | Status: DC

## 2019-06-29 NOTE — Patient Instructions (Signed)
Medication Instructions:  Stop Amiodarone  Start Eliquis 5 mg twice a day  Continue all other medications  *If you need a refill on your cardiac medications before your next appointment, please call your pharmacy*  Lab Work: None ordered   Testing/Procedures: Schedule Echo  Follow-Up: At [facility], you and your health needs are our priority.  As part of our continuing mission to provide you with exceptional heart care, we have created designated Provider Care Teams.  These Care Teams include your primary Cardiologist (physician) and Advanced Practice Providers (APPs -  Physician Assistants and Nurse Practitioners) who all work together to provide you with the care you need, when you need it.  Your next appointment:  3 months  The format for your next appointment:  Office   Provider:  Dr.Schumann

## 2019-07-05 ENCOUNTER — Other Ambulatory Visit: Payer: Self-pay

## 2019-07-05 ENCOUNTER — Ambulatory Visit (HOSPITAL_COMMUNITY): Payer: Medicare Other | Attending: Cardiology

## 2019-07-05 DIAGNOSIS — I10 Essential (primary) hypertension: Secondary | ICD-10-CM | POA: Diagnosis not present

## 2019-07-05 DIAGNOSIS — I480 Paroxysmal atrial fibrillation: Secondary | ICD-10-CM

## 2019-07-14 DIAGNOSIS — Z96651 Presence of right artificial knee joint: Secondary | ICD-10-CM | POA: Diagnosis not present

## 2019-07-18 ENCOUNTER — Ambulatory Visit
Admission: RE | Admit: 2019-07-18 | Discharge: 2019-07-18 | Disposition: A | Discharge status: Home / Self Care | Payer: Medicare Other | Source: Ambulatory Visit | Attending: Family Medicine | Admitting: Family Medicine

## 2019-07-18 ENCOUNTER — Other Ambulatory Visit: Payer: Self-pay

## 2019-07-18 DIAGNOSIS — Z1231 Encounter for screening mammogram for malignant neoplasm of breast: Secondary | ICD-10-CM | POA: Diagnosis not present

## 2019-07-19 ENCOUNTER — Telehealth: Payer: Self-pay

## 2019-07-19 NOTE — Telephone Encounter (Signed)
Her CPE in October will count as medical clearance but she will need cardiology clearance as well

## 2019-07-19 NOTE — Telephone Encounter (Signed)
Patient is having a total knee replacement. Surgeon is requiring PCP to approve surgical clearance. Patient wants to know if her CPE in Oct. 2020 is recent enough info for paperwork to be filled out or if she needs to be seen again. Please advise.

## 2019-07-19 NOTE — Telephone Encounter (Signed)
Pt made aware and will have a copy of clearance form faxed to our office.

## 2019-07-21 DIAGNOSIS — M48062 Spinal stenosis, lumbar region with neurogenic claudication: Secondary | ICD-10-CM | POA: Diagnosis not present

## 2019-07-26 ENCOUNTER — Other Ambulatory Visit: Payer: Self-pay | Admitting: Neurosurgery

## 2019-07-26 DIAGNOSIS — S32009K Unspecified fracture of unspecified lumbar vertebra, subsequent encounter for fracture with nonunion: Secondary | ICD-10-CM

## 2019-07-27 ENCOUNTER — Telehealth: Payer: Self-pay

## 2019-07-27 NOTE — Telephone Encounter (Signed)
   [facility] Pre-operative Risk Assessment    Request for surgical clearance:  1. What type of surgery is being performed? Conversion of partial to total right knee   2. When is this surgery scheduled? TBD   3. What type of clearance is required (medical clearance vs. Pharmacy clearance to hold med vs. Both)? BOTH  4. Are there any medications that need to be held prior to surgery and how long?  Eliquis  5. Practice name and name of physician performing surgery? [facility]- Dr.Lucey   6. What is your office phone number [phone]    7.   What is your office fax number [phone]  8.   Anesthesia type (None, local, MAC, general) ? Unknown    Ena Dawley 07/27/2019, 4:25 PM  _________________________________________________________________   (provider comments below)

## 2019-07-28 NOTE — Telephone Encounter (Signed)
   Primary Cardiologist: Little Ishikawa, MD  Chart reviewed as part of pre-operative protocol coverage. Patient was recently seen by Dr. Bjorn Pippin on 06/29/2019 at which time, patient reported tremors and nausea felt to be due to the Amiodarone but was otherwise doing well from a cardiac standpoint. She had an Echo on 07/05/2019 which showed LVEF of 60-65% with normal wall motions, grade 1 diastolic dysfunction, and no significant valvular disease. I called and spoke with patient today and she reports doing well since Amiodarone was stopped. Tremors have improved. No chest pain, shortness of breath, palpitations, lightheadedness, dizziness, syncope, orthopnea, PND, or edema. Able to complete >4.0 METS with no anginal symptoms. Per Revised Cardiac Risk Index, considered low risk.   Given past medical history and time since last visit, based on ACC/AHA guidelines, Melissa Jordan would be at acceptable risk for the planned procedure without further cardiovascular testing.  Per Pharmacy, recommend holding Eliquis for 3 days prior to knee surgery. This should be restarted as soon as felt to be feasible from a surgical standpoint in the post-operative period.  I will route this recommendation to the requesting party via Epic fax function and remove from pre-op pool.  Please call with questions.  Corrin Parker, PA-C 07/28/2019, 1:49 PM

## 2019-07-28 NOTE — Telephone Encounter (Signed)
Pt takes Eliquis for afib with CHADS2VASc score of 3 (age, sex, HTN). Renal function is normal. Recommend holding Eliquis for 3 days prior to TKA.

## 2019-08-04 ENCOUNTER — Ambulatory Visit
Admission: RE | Admit: 2019-08-04 | Discharge: 2019-08-04 | Disposition: A | Discharge status: Home / Self Care | Payer: Medicare Other | Source: Ambulatory Visit | Attending: Neurosurgery | Admitting: Neurosurgery

## 2019-08-04 DIAGNOSIS — S32009K Unspecified fracture of unspecified lumbar vertebra, subsequent encounter for fracture with nonunion: Secondary | ICD-10-CM

## 2019-08-04 DIAGNOSIS — M4326 Fusion of spine, lumbar region: Secondary | ICD-10-CM | POA: Diagnosis not present

## 2019-08-09 DIAGNOSIS — M461 Sacroiliitis, not elsewhere classified: Secondary | ICD-10-CM | POA: Insufficient documentation

## 2019-08-19 ENCOUNTER — Other Ambulatory Visit: Payer: Self-pay | Admitting: Family Medicine

## 2019-08-26 ENCOUNTER — Telehealth: Payer: Self-pay | Admitting: Family Medicine

## 2019-08-26 DIAGNOSIS — Z01818 Encounter for other preprocedural examination: Secondary | ICD-10-CM

## 2019-08-26 NOTE — Telephone Encounter (Signed)
Pt called in asking if she can come here to have a labs that Dr. Valentina Gu needs her to have done. She needs to have a CMP and a CBC. She is scheduled for surgery on 09/12/2019//ELEA

## 2019-08-26 NOTE — Telephone Encounter (Signed)
Please advise 

## 2019-08-26 NOTE — Telephone Encounter (Signed)
Ok for labs, dx pre-op testing

## 2019-08-26 NOTE — Telephone Encounter (Signed)
Pt said THANK YOU, THANK YOU, THANK YOU!

## 2019-08-26 NOTE — Telephone Encounter (Signed)
These labs were ordered. Ok to schedule.

## 2019-08-29 ENCOUNTER — Other Ambulatory Visit: Payer: Self-pay | Admitting: Neurosurgery

## 2019-08-29 DIAGNOSIS — M461 Sacroiliitis, not elsewhere classified: Secondary | ICD-10-CM

## 2019-08-31 ENCOUNTER — Ambulatory Visit
Admission: RE | Admit: 2019-08-31 | Discharge: 2019-08-31 | Disposition: A | Discharge status: Home / Self Care | Payer: Medicare Other | Source: Ambulatory Visit | Attending: Neurosurgery | Admitting: Neurosurgery

## 2019-08-31 ENCOUNTER — Other Ambulatory Visit: Payer: Self-pay

## 2019-08-31 DIAGNOSIS — M461 Sacroiliitis, not elsewhere classified: Secondary | ICD-10-CM

## 2019-08-31 DIAGNOSIS — M545 Low back pain: Secondary | ICD-10-CM | POA: Diagnosis not present

## 2019-08-31 MED ORDER — IOPAMIDOL (ISOVUE-M 200) INJECTION 41%
1.0000 mL | Freq: Once | INTRAMUSCULAR | Status: AC
  Administered 2019-08-31: 1 mL via INTRA_ARTICULAR

## 2019-08-31 MED ORDER — METHYLPREDNISOLONE ACETATE 40 MG/ML INJ SUSP (RADIOLOG
120.0000 mg | Freq: Once | INTRAMUSCULAR | Status: AC
  Administered 2019-08-31: 120 mg via INTRA_ARTICULAR

## 2019-08-31 NOTE — Discharge Instructions (Signed)
Joint Injection Discharge Instructions  1. After your joint injection, use ice to the affected area for the next 24 hours as a temporary increase in pain is not uncommon for a day or two after your procedure.  2. Resume all medications unless otherwise instructed.  3. Common side effects of steroids include facial flushing or redness, restlessness or inability to sleep and an increase in your blood sugar if you are a diabetic.    4. Follow up with the ordering physician for post care.  5. If you have any of the following please call [phone]:      Temperature greater than 101     Pain, redness or swelling at the injection site   You may resume your Eliquis today.

## 2019-08-31 NOTE — Discharge Instr - Other Orders (Signed)
Pt did not come to nurses station. Dr. Charise Killian stated she did great with the injection and allowed the pt to be discharged home directly after.

## 2019-08-31 NOTE — Discharge Instr - Other Orders (Signed)
Pt did not come to nurses station to be discharged. Per Dr. Charise Killian the pt did great during the injection and he discharged her home directly after procedure.

## 2019-09-01 ENCOUNTER — Ambulatory Visit: Payer: Medicare Other

## 2019-09-02 ENCOUNTER — Ambulatory Visit: Payer: Medicare Other | Admitting: Orthopedic Surgery

## 2019-09-02 ENCOUNTER — Encounter: Payer: Self-pay | Admitting: Orthopedic Surgery

## 2019-09-02 ENCOUNTER — Ambulatory Visit (INDEPENDENT_AMBULATORY_CARE_PROVIDER_SITE_OTHER): Payer: Medicare Other

## 2019-09-02 ENCOUNTER — Other Ambulatory Visit: Payer: Self-pay

## 2019-09-02 DIAGNOSIS — M25561 Pain in right knee: Secondary | ICD-10-CM | POA: Diagnosis not present
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
## 2019-09-06 ENCOUNTER — Other Ambulatory Visit: Payer: Self-pay

## 2019-09-12 ENCOUNTER — Other Ambulatory Visit: Payer: Self-pay | Admitting: Family Medicine

## 2019-09-19 NOTE — Pre-Procedure Instructions (Signed)
[facility] ([address]), [place] - [address] [place] [address] Phone: [phone] Fax: [phone]  [facility], [place] [address] [place] [address] Phone: [phone] Fax: [phone]  [id number] [place], [place] [address] [place] [address] Phone: [phone] Fax: [phone]      Your procedure is scheduled on Thursday, April 8th, from 1:41 PM to 5:48 PM.  Report to [facility] Main Entrance "A" at 11:40 A.M., and check in at the Admitting office.  Call this number if you have problems the morning of surgery:  [phone]  Call [phone] if you have any questions prior to your surgery date Monday-Friday 8am-4pm.    Remember:  Do not eat after midnight the night before your surgery.  You may drink clear liquids until 10:40 AM the morning of your surgery.    Clear liquids allowed are: Water, Non-Citrus Juices (without pulp), Carbonated Beverages, Clear Tea, Black Coffee Only, and Gatorade.   Enhanced Recovery after Surgery for Orthopedics Enhanced Recovery after Surgery is a protocol used to improve the stress on your body and your recovery after surgery.     . The Day of Surgery:  o Drink ONE (1) Pre-Surgery Clear Ensure by 10:40 AM.   o This drink was given to you during your hospital  pre-op appointment visit.  o Nothing else to drink after completing the  Pre-Surgery Clear Ensure.      Take these medicines the morning of surgery with A SIP OF WATER : estradiol (ESTRACE)  levothyroxine (SYNTHROID) metoprolol tartrate (LOPRESSOR) HYDROcodone-acetaminophen (NORCO/VICODIN)- if needed fluticasone (FLONASE) nasal spray acetaminophen (TYLENOL)- if needed   **Follow your surgeon's instructions on when to stop apixaban (ELIQUIS).  If no instructions were  given by your surgeon then you will need to call the office to get those instructions. **   As of today, stop taking all Aspirin (unless instructed by your doctor) and other Aspirin containing products, Vitamins, Fish Oils, and Herbal Medications. Also stop all NSAIDS i.e. Advil, Ibuprofen, Motrin, Aleve, Anaprox, Naproxen, BC, Goody Powders, and all Supplements.   No Smoking of any kind, Tobacco, or Alcohol products 24 hours prior to your procedure. If you use a CPAP at night, you may bring all equipment for your overnight stay.                        Do not wear jewelry, make up, or nail polish.            Do not wear lotions, powders, perfumes, or deodorant.            Do not shave 48 hours prior to surgery.              Do not bring valuables to the hospital.            [facility] is not responsible for any belongings or valuables.   Contacts, glasses, dentures or bridgework may not be worn into surgery.      For patients admitted to the hospital, discharge time will be determined by your treatment team.   Patients discharged the day of surgery will not be allowed to drive home, and someone needs to stay with them for 24 hours.    Special instructions:   [facility]- Preparing For Surgery  Before surgery, you can play an important role. Because skin is not sterile, your skin needs to be as free of germs as possible. You can reduce the number of germs on your skin by washing with CHG (chlorahexidine gluconate) Soap before surgery.  CHG is an antiseptic cleaner which kills germs and bonds with the skin to continue killing germs even after washing.    Oral Hygiene is also important to reduce your risk of infection.  Remember - BRUSH YOUR TEETH THE MORNING OF SURGERY WITH YOUR REGULAR TOOTHPASTE  Please do not use if you have an allergy to CHG or antibacterial soaps. If your skin becomes reddened/irritated stop using the CHG.  Do not shave (including legs and underarms) for at least 48  hours prior to first CHG shower. It is OK to shave your face.  Please follow these instructions carefully.   1. Shower the NIGHT BEFORE SURGERY and the MORNING OF SURGERY with CHG Soap.   2. If you chose to wash your hair, wash your hair first as usual with your normal shampoo.  3. After you shampoo, rinse your hair and body thoroughly to remove the shampoo.  4. Use CHG as you would any other liquid soap. You can apply CHG directly to the skin and wash gently with a scrungie or a clean washcloth.   5. Apply the CHG Soap to your body ONLY FROM THE NECK DOWN.  Do not use on open wounds or open sores. Avoid contact with your eyes, ears, mouth and genitals (private parts). Wash Face and genitals (private parts)  with your normal soap.   6. Wash thoroughly, paying special attention to the area where your surgery will be performed.  7. Thoroughly rinse your body with warm water from the neck down.  8. DO NOT shower/wash with your normal soap after using and rinsing off the CHG Soap.  9. Pat yourself dry with a CLEAN TOWEL.  10. Wear CLEAN PAJAMAS to bed the night before surgery, wear comfortable clothes the morning of surgery  11. Place CLEAN SHEETS on your bed the night of your first shower and DO NOT SLEEP WITH PETS.   Day of Surgery:   Do not apply any deodorants/lotions.  Please wear clean clothes to the hospital/surgery center.   Remember to brush your teeth WITH YOUR REGULAR TOOTHPASTE.   Please read over the following fact sheets that you were given.

## 2019-09-20 ENCOUNTER — Other Ambulatory Visit: Payer: Self-pay

## 2019-09-20 ENCOUNTER — Encounter (HOSPITAL_COMMUNITY): Payer: Self-pay

## 2019-09-20 ENCOUNTER — Telehealth: Payer: Self-pay | Admitting: Radiology

## 2019-09-20 ENCOUNTER — Encounter (HOSPITAL_COMMUNITY)
Admission: RE | Admit: 2019-09-20 | Discharge: 2019-09-20 | Disposition: A | Discharge status: Home / Self Care | Payer: Medicare Other | Source: Ambulatory Visit | Attending: Orthopedic Surgery | Admitting: Orthopedic Surgery

## 2019-09-20 DIAGNOSIS — Z01812 Encounter for preprocedural laboratory examination: Secondary | ICD-10-CM | POA: Insufficient documentation

## 2019-09-20 HISTORY — DX: Claustrophobia: F40.240

## 2019-09-20 HISTORY — DX: Hypothyroidism, unspecified: E03.9

## 2019-09-20 LAB — CBC
HCT: 42.4 % (ref 36.0–46.0)
Hemoglobin: 13.3 g/dL (ref 12.0–15.0)
MCH: 29 pg (ref 26.0–34.0)
MCHC: 31.4 g/dL (ref 30.0–36.0)
MCV: 92.4 fL (ref 80.0–100.0)
Platelets: 256 10*3/uL (ref 150–400)
RBC: 4.59 MIL/uL (ref 3.87–5.11)
RDW: 14.2 % (ref 11.5–15.5)
WBC: 8.3 10*3/uL (ref 4.0–10.5)
nRBC: 0 % (ref 0.0–0.2)

## 2019-09-20 LAB — BASIC METABOLIC PANEL
Anion gap: 8 (ref 5–15)
BUN: 21 mg/dL (ref 8–23)
CO2: 27 mmol/L (ref 22–32)
Calcium: 8.9 mg/dL (ref 8.9–10.3)
Chloride: 105 mmol/L (ref 98–111)
Creatinine, Ser: 0.81 mg/dL (ref 0.44–1.00)
GFR calc Af Amer: >60 mL/min (ref >60)
GFR calc non Af Amer: >60 mL/min (ref >60)
Glucose, Bld: 85 mg/dL (ref 70–99)
Potassium: 4.3 mmol/L (ref 3.5–5.1)
Sodium: 140 mmol/L (ref 135–145)

## 2019-09-20 LAB — SURGICAL PCR SCREEN
MRSA, PCR: NEGATIVE
Staphylococcus aureus: POSITIVE — AB

## 2019-09-20 LAB — URINALYSIS, ROUTINE W REFLEX MICROSCOPIC
Bilirubin Urine: NEGATIVE
Glucose, UA: NEGATIVE mg/dL
Hgb urine dipstick: NEGATIVE
Ketones, ur: NEGATIVE mg/dL
Leukocytes,Ua: NEGATIVE
Nitrite: NEGATIVE
Protein, ur: NEGATIVE mg/dL
Specific Gravity, Urine: 1.023 (ref 1.005–1.030)
pH: 6 (ref 5.0–8.0)

## 2019-09-20 NOTE — Progress Notes (Signed)
PCR positive for Staph. Dr. Diamantina Providence office notified. Called in prescription to patient's preferred pharmacy ([facility] on [address]) for Mupirocin). Called and notified patient about positive staph result and to use mupirocin rx. Bid x5 days prior to sx.

## 2019-09-20 NOTE — Telephone Encounter (Signed)
Christine called to advised Dr. August Saucer that patient was seen this morning for pre-op appt states that her swab was positive for staph.

## 2019-09-20 NOTE — Progress Notes (Signed)
PCP - Sheliah Hatch, MD Cardiologist - Epifanio Lesches, MD  PPM/ICD - Denies  Chest x-ray - N/A EKG - 06/29/19 Stress Test - 10/25/1997 ECHO - 07/05/19 Cardiac Cath - Denies  Sleep Study - Denies  Patient denies being a diabetic.  Blood Thinner Instructions: Per patient, last Eliquis dose 09/25/19 Aspirin Instructions: N/A  ERAS Protcol - Yes PRE-SURGERY Ensure or G2- Ensure given  COVID TEST- 09/24/19   Anesthesia review: Yes, review Echo and stress test.  Patient denies shortness of breath, fever, cough and chest pain at PAT appointment   All instructions explained to the patient, with a verbal understanding of the material. Patient agrees to go over the instructions while at home for a better understanding. Patient also instructed to self quarantine after being tested for COVID-19. The opportunity to ask questions was provided.

## 2019-09-21 DIAGNOSIS — M960 Pseudarthrosis after fusion or arthrodesis: Secondary | ICD-10-CM | POA: Diagnosis not present

## 2019-09-21 DIAGNOSIS — Z981 Arthrodesis status: Secondary | ICD-10-CM | POA: Diagnosis not present

## 2019-09-21 LAB — URINE CULTURE: Culture: NO GROWTH

## 2019-09-21 NOTE — Progress Notes (Signed)
Anesthesia Chart Review:  Follows with cardiology for hx of paroxysmal afib on Eliquis. Cardiac clearance per telephone encounter 07/28/19, "Patient was recently seen by Dr. Bjorn Pippin on 06/29/2019 at which time, patient reported tremors and nausea felt to be due to the Amiodarone but was otherwise doing well from a cardiac standpoint. She had an Echo on 07/05/2019 which showed LVEF of 60-65% with normal wall motions, grade 1 diastolic dysfunction, and no significant valvular disease. I called and spoke with patient today and she reports doing well since Amiodarone was stopped. Tremors have improved. No chest pain, shortness of breath, palpitations, lightheadedness, dizziness, syncope, orthopnea, PND, or edema. Able to complete >4.0 METS with no anginal symptoms. Per Revised Cardiac Risk Index, considered low risk.   Given past medical history and time since last visit, based on ACC/AHA guidelines, NATASSIA GUTHRIDGE would be at acceptable risk for the planned procedure without further cardiovascular testing.  Per Pharmacy, recommend holding Eliquis for 3 days prior to knee surgery. This should be restarted as soon as felt to be feasible from a surgical standpoint in the post-operative period."  She recently underwent back surgery on 05/06/2019, lumbar fusionL2-3.  No complications, no AF recurrence.    Preop labs reviewed, WNL.  EKG 06/29/19: Sinus bradycardia. Rate 59. Nonspecific t wave abnormality.   TTE 07/05/19: 1. Left ventricular ejection fraction, by visual estimation, is 60 to  65%. The left ventricle has normal function. There is no left ventricular  hypertrophy.  2. Left ventricular diastolic parameters are consistent with Grade I  diastolic dysfunction (impaired relaxation).  3. The left ventricle has no regional wall motion abnormalities.  4. Global right ventricle has normal systolic function.The right  ventricular size is normal.  5. Left atrial size was normal.  6. Right atrial  size was normal.  7. The mitral valve is normal in structure. Mild mitral valve  regurgitation. No evidence of mitral stenosis.  8. The tricuspid valve is normal in structure.  9. The aortic valve is tricuspid. Aortic valve regurgitation is not  visualized. Mild aortic valve sclerosis without stenosis.  10. The pulmonic valve was normal in structure. Pulmonic valve  regurgitation is not visualized.  11. The inferior vena cava is normal in size with greater than 50%  respiratory variability, suggesting right atrial pressure of 3 mmHg.  12. Normal LV systolic function; grade 1 diastolic dysfunction; mild MR  and TR.    Zannie Cove [facility] Stay [facility]/Anesthesiology Phone [phone] 09/21/2019 3:38 PM

## 2019-09-21 NOTE — Telephone Encounter (Signed)
Pls advise. Thanks.  

## 2019-09-21 NOTE — Telephone Encounter (Signed)
She needs the standard decontamination protocol thanks

## 2019-09-21 NOTE — Anesthesia Preprocedure Evaluation (Addendum)
Anesthesia Evaluation  Patient identified by MRN, date of birth, ID band Patient awake    Reviewed: Allergy & Precautions, H&P , NPO status , Patient's Chart, lab work & pertinent test results, reviewed documented beta blocker date and time   History of Anesthesia Complications (+) PONV and history of anesthetic complications  Airway Mallampati: II  TM Distance: >3 FB Neck ROM: full    Dental no notable dental hx. (+) Teeth Intact, Dental Advisory Given   Pulmonary asthma ,    Pulmonary exam normal breath sounds clear to auscultation       Cardiovascular Exercise Tolerance: Good hypertension, Pt. on medications and Pt. on home beta blockers + dysrhythmias Atrial Fibrillation  Rhythm:regular Rate:Normal     Neuro/Psych Anxiety  Neuromuscular disease negative psych ROS   GI/Hepatic Neg liver ROS, GERD  ,  Endo/Other  Hypothyroidism   Renal/GU negative Renal ROS  negative genitourinary   Musculoskeletal  (+) Arthritis , Osteoarthritis,    Abdominal   Peds  Hematology negative hematology ROS (+) anemia ,   Anesthesia Other Findings   Reproductive/Obstetrics negative OB ROS                           Anesthesia Physical Anesthesia Plan  ASA: III  Anesthesia Plan: General   Post-op Pain Management:    Induction: Intravenous  PONV Risk Score and Plan: 4 or greater and Ondansetron, Dexamethasone, Treatment may vary due to age or medical condition, Midazolam, Propofol infusion and Scopolamine patch - Pre-op  Airway Management Planned: Oral ETT and LMA  Additional Equipment:   Intra-op Plan:   Post-operative Plan: Extubation in OR  Informed Consent: I have reviewed the patients History and Physical, chart, labs and discussed the procedure including the risks, benefits and alternatives for the proposed anesthesia with the patient or authorized representative who has indicated his/her  understanding and acceptance.     Dental Advisory Given  Plan Discussed with: CRNA, Anesthesiologist and Surgeon  Anesthesia Plan Comments: (Follows with cardiology for hx of paroxysmal afib on Eliquis. Cardiac clearance per telephone encounter 07/28/19, "Patient was recently seen by Dr. Gardiner Rhyme on 06/29/2019 at which time, patient reported tremors and nausea felt to be due to the Amiodarone but was otherwise doing well from a cardiac standpoint. She had an Echo on 07/05/2019 which showed LVEF of 60-65% with normal wall motions, grade 1 diastolic dysfunction, and no significant valvular disease. I called and spoke with patient today and she reports doing well since Amiodarone was stopped. Tremors have improved. No chest pain, shortness of breath, palpitations, lightheadedness, dizziness, syncope, orthopnea, PND, or edema. Able to complete >4.0 METS with no anginal symptoms. Per Revised Cardiac Risk Index, considered low risk.   Given past medical history and time since last visit, based on ACC/AHA guidelines, Melissa Jordan would be at acceptable risk for the planned procedure without further cardiovascular testing.  Per Pharmacy, recommend holding Eliquis for 3 days prior to knee surgery. This should be restarted as soon as felt to be feasible from a surgical standpoint in the post-operative period."  She recently underwent back surgery on 05/06/2019, lumbar fusionL2-3.  No complications, no AF recurrence.    Preop labs reviewed, WNL.  EKG 06/29/19: Sinus bradycardia. Rate 59. Nonspecific t wave abnormality.   TTE 07/05/19: 1. Left ventricular ejection fraction, by visual estimation, is 60 to  65%. The left ventricle has normal function. There is no left ventricular  hypertrophy.  2. Left ventricular diastolic parameters are consistent with Grade I  diastolic dysfunction (impaired relaxation).  3. The left ventricle has no regional wall motion abnormalities.  4. Global right ventricle  has normal systolic function.The right  ventricular size is normal.  5. Left atrial size was normal.  6. Right atrial size was normal.  7. The mitral valve is normal in structure. Mild mitral valve  regurgitation. No evidence of mitral stenosis.  8. The tricuspid valve is normal in structure.  9. The aortic valve is tricuspid. Aortic valve regurgitation is not  visualized. Mild aortic valve sclerosis without stenosis.  10. The pulmonic valve was normal in structure. Pulmonic valve  regurgitation is not visualized.  11. The inferior vena cava is normal in size with greater than 50%  respiratory variability, suggesting right atrial pressure of 3 mmHg.  12. Normal LV systolic function; grade 1 diastolic dysfunction; mild MR  and TR.  )     Anesthesia Quick Evaluation

## 2019-09-22 NOTE — Telephone Encounter (Signed)
Does this require orders from you?

## 2019-09-23 NOTE — Telephone Encounter (Signed)
No, nothing additional from my end

## 2019-09-26 ENCOUNTER — Other Ambulatory Visit (HOSPITAL_COMMUNITY)
Admission: RE | Admit: 2019-09-26 | Discharge: 2019-09-26 | Disposition: A | Discharge status: Home / Self Care | Payer: Medicare Other | Source: Ambulatory Visit | Attending: Orthopedic Surgery | Admitting: Orthopedic Surgery

## 2019-09-26 DIAGNOSIS — Z825 Family history of asthma and other chronic lower respiratory diseases: Secondary | ICD-10-CM | POA: Diagnosis not present

## 2019-09-26 DIAGNOSIS — M8580 Other specified disorders of bone density and structure, unspecified site: Secondary | ICD-10-CM | POA: Diagnosis not present

## 2019-09-26 DIAGNOSIS — T8484XA Pain due to internal orthopedic prosthetic devices, implants and grafts, initial encounter: Secondary | ICD-10-CM | POA: Diagnosis not present

## 2019-09-26 DIAGNOSIS — Z20822 Contact with and (suspected) exposure to covid-19: Secondary | ICD-10-CM | POA: Insufficient documentation

## 2019-09-26 DIAGNOSIS — Z981 Arthrodesis status: Secondary | ICD-10-CM | POA: Diagnosis not present

## 2019-09-26 DIAGNOSIS — J45909 Unspecified asthma, uncomplicated: Secondary | ICD-10-CM | POA: Diagnosis not present

## 2019-09-26 DIAGNOSIS — I480 Paroxysmal atrial fibrillation: Secondary | ICD-10-CM | POA: Diagnosis not present

## 2019-09-26 DIAGNOSIS — E039 Hypothyroidism, unspecified: Secondary | ICD-10-CM | POA: Diagnosis not present

## 2019-09-26 DIAGNOSIS — Z885 Allergy status to narcotic agent status: Secondary | ICD-10-CM | POA: Diagnosis not present

## 2019-09-26 DIAGNOSIS — Z888 Allergy status to other drugs, medicaments and biological substances status: Secondary | ICD-10-CM | POA: Diagnosis not present

## 2019-09-26 DIAGNOSIS — Z880 Allergy status to penicillin: Secondary | ICD-10-CM | POA: Diagnosis not present

## 2019-09-26 DIAGNOSIS — Z823 Family history of stroke: Secondary | ICD-10-CM | POA: Diagnosis not present

## 2019-09-26 DIAGNOSIS — Z596 Low income: Secondary | ICD-10-CM | POA: Diagnosis not present

## 2019-09-26 DIAGNOSIS — Z01812 Encounter for preprocedural laboratory examination: Secondary | ICD-10-CM | POA: Insufficient documentation

## 2019-09-26 DIAGNOSIS — Z7901 Long term (current) use of anticoagulants: Secondary | ICD-10-CM | POA: Diagnosis not present

## 2019-09-26 DIAGNOSIS — Z96653 Presence of artificial knee joint, bilateral: Secondary | ICD-10-CM | POA: Diagnosis not present

## 2019-09-26 DIAGNOSIS — K219 Gastro-esophageal reflux disease without esophagitis: Secondary | ICD-10-CM | POA: Diagnosis not present

## 2019-09-26 DIAGNOSIS — Z882 Allergy status to sulfonamides status: Secondary | ICD-10-CM | POA: Diagnosis not present

## 2019-09-26 DIAGNOSIS — R000 Tachycardia, unspecified: Secondary | ICD-10-CM | POA: Diagnosis not present

## 2019-09-26 DIAGNOSIS — T462X5A Adverse effect of other antidysrhythmic drugs, initial encounter: Secondary | ICD-10-CM | POA: Diagnosis not present

## 2019-09-26 DIAGNOSIS — R251 Tremor, unspecified: Secondary | ICD-10-CM | POA: Diagnosis not present

## 2019-09-26 DIAGNOSIS — Z79899 Other long term (current) drug therapy: Secondary | ICD-10-CM | POA: Diagnosis not present

## 2019-09-26 DIAGNOSIS — I10 Essential (primary) hypertension: Secondary | ICD-10-CM | POA: Diagnosis not present

## 2019-09-26 DIAGNOSIS — M1711 Unilateral primary osteoarthritis, right knee: Secondary | ICD-10-CM | POA: Diagnosis not present

## 2019-09-26 LAB — SARS CORONAVIRUS 2 (TAT 6-24 HRS): SARS Coronavirus 2: NEGATIVE

## 2019-09-29 ENCOUNTER — Inpatient Hospital Stay (HOSPITAL_COMMUNITY)
Admission: RE | Admit: 2019-09-29 | Discharge: 2019-10-03 | DRG: 468 | Disposition: A | Discharge status: Home Health | Payer: Medicare Other | Attending: Orthopedic Surgery | Admitting: Orthopedic Surgery

## 2019-09-29 ENCOUNTER — Other Ambulatory Visit: Payer: Self-pay

## 2019-09-29 ENCOUNTER — Inpatient Hospital Stay (HOSPITAL_COMMUNITY): Payer: Medicare Other | Admitting: Anesthesiology

## 2019-09-29 ENCOUNTER — Inpatient Hospital Stay (HOSPITAL_COMMUNITY): Payer: Medicare Other | Admitting: Vascular Surgery

## 2019-09-29 ENCOUNTER — Encounter (HOSPITAL_COMMUNITY)
Admission: RE | Disposition: A | Discharge status: Home / Self Care | Payer: Self-pay | Source: Home / Self Care | Attending: Orthopedic Surgery

## 2019-09-29 ENCOUNTER — Encounter (HOSPITAL_COMMUNITY): Payer: Self-pay | Admitting: Orthopedic Surgery

## 2019-09-29 DIAGNOSIS — I480 Paroxysmal atrial fibrillation: Secondary | ICD-10-CM | POA: Diagnosis present

## 2019-09-29 DIAGNOSIS — Z79899 Other long term (current) drug therapy: Secondary | ICD-10-CM

## 2019-09-29 DIAGNOSIS — Z825 Family history of asthma and other chronic lower respiratory diseases: Secondary | ICD-10-CM

## 2019-09-29 DIAGNOSIS — E785 Hyperlipidemia, unspecified: Secondary | ICD-10-CM | POA: Diagnosis not present

## 2019-09-29 DIAGNOSIS — R0789 Other chest pain: Secondary | ICD-10-CM | POA: Diagnosis not present

## 2019-09-29 DIAGNOSIS — Z888 Allergy status to other drugs, medicaments and biological substances status: Secondary | ICD-10-CM | POA: Diagnosis not present

## 2019-09-29 DIAGNOSIS — Z8249 Family history of ischemic heart disease and other diseases of the circulatory system: Secondary | ICD-10-CM

## 2019-09-29 DIAGNOSIS — M8580 Other specified disorders of bone density and structure, unspecified site: Secondary | ICD-10-CM | POA: Diagnosis present

## 2019-09-29 DIAGNOSIS — Z96653 Presence of artificial knee joint, bilateral: Secondary | ICD-10-CM | POA: Diagnosis present

## 2019-09-29 DIAGNOSIS — Z882 Allergy status to sulfonamides status: Secondary | ICD-10-CM

## 2019-09-29 DIAGNOSIS — M1711 Unilateral primary osteoarthritis, right knee: Secondary | ICD-10-CM | POA: Diagnosis not present

## 2019-09-29 DIAGNOSIS — Z981 Arthrodesis status: Secondary | ICD-10-CM

## 2019-09-29 DIAGNOSIS — Z823 Family history of stroke: Secondary | ICD-10-CM

## 2019-09-29 DIAGNOSIS — K219 Gastro-esophageal reflux disease without esophagitis: Secondary | ICD-10-CM | POA: Diagnosis present

## 2019-09-29 DIAGNOSIS — T462X5A Adverse effect of other antidysrhythmic drugs, initial encounter: Secondary | ICD-10-CM | POA: Diagnosis not present

## 2019-09-29 DIAGNOSIS — Z596 Low income: Secondary | ICD-10-CM

## 2019-09-29 DIAGNOSIS — Z7901 Long term (current) use of anticoagulants: Secondary | ICD-10-CM

## 2019-09-29 DIAGNOSIS — Z20822 Contact with and (suspected) exposure to covid-19: Secondary | ICD-10-CM | POA: Diagnosis not present

## 2019-09-29 DIAGNOSIS — Z79890 Hormone replacement therapy (postmenopausal): Secondary | ICD-10-CM

## 2019-09-29 DIAGNOSIS — G8918 Other acute postprocedural pain: Secondary | ICD-10-CM | POA: Diagnosis not present

## 2019-09-29 DIAGNOSIS — R000 Tachycardia, unspecified: Secondary | ICD-10-CM | POA: Diagnosis not present

## 2019-09-29 DIAGNOSIS — T8484XA Pain due to internal orthopedic prosthetic devices, implants and grafts, initial encounter: Principal | ICD-10-CM | POA: Diagnosis present

## 2019-09-29 DIAGNOSIS — Z885 Allergy status to narcotic agent status: Secondary | ICD-10-CM | POA: Diagnosis not present

## 2019-09-29 DIAGNOSIS — I10 Essential (primary) hypertension: Secondary | ICD-10-CM | POA: Diagnosis present

## 2019-09-29 DIAGNOSIS — Z880 Allergy status to penicillin: Secondary | ICD-10-CM

## 2019-09-29 DIAGNOSIS — Z96651 Presence of right artificial knee joint: Secondary | ICD-10-CM | POA: Diagnosis not present

## 2019-09-29 DIAGNOSIS — E039 Hypothyroidism, unspecified: Secondary | ICD-10-CM | POA: Diagnosis present

## 2019-09-29 DIAGNOSIS — I9581 Postprocedural hypotension: Secondary | ICD-10-CM | POA: Diagnosis not present

## 2019-09-29 DIAGNOSIS — R251 Tremor, unspecified: Secondary | ICD-10-CM | POA: Diagnosis not present

## 2019-09-29 DIAGNOSIS — J45909 Unspecified asthma, uncomplicated: Secondary | ICD-10-CM | POA: Diagnosis present

## 2019-09-29 DIAGNOSIS — R079 Chest pain, unspecified: Secondary | ICD-10-CM | POA: Diagnosis not present

## 2019-09-29 DIAGNOSIS — T84092A Other mechanical complication of internal right knee prosthesis, initial encounter: Secondary | ICD-10-CM

## 2019-09-29 DIAGNOSIS — M25461 Effusion, right knee: Secondary | ICD-10-CM | POA: Diagnosis not present

## 2019-09-29 DIAGNOSIS — Z811 Family history of alcohol abuse and dependence: Secondary | ICD-10-CM

## 2019-09-29 HISTORY — PX: TOTAL KNEE REVISION: SHX996

## 2019-09-29 LAB — PROTIME-INR
INR: 1 (ref 0.8–1.2)
Prothrombin Time: 13.2 seconds (ref 11.4–15.2)

## 2019-09-29 SURGERY — TOTAL KNEE REVISION
Anesthesia: General | Site: Knee | Laterality: Right

## 2019-09-29 MED ORDER — FENTANYL CITRATE (PF) 100 MCG/2ML IJ SOLN
INTRAMUSCULAR | Status: AC
  Filled 2019-09-29: qty 2

## 2019-09-29 MED ORDER — PROPOFOL 10 MG/ML IV BOLUS
INTRAVENOUS | Status: DC | PRN
  Administered 2019-09-29: 120 mg via INTRAVENOUS

## 2019-09-29 MED ORDER — ONDANSETRON HCL 4 MG/2ML IJ SOLN
4.0000 mg | Freq: Four times a day (QID) | INTRAMUSCULAR | Status: DC | PRN
  Administered 2019-09-30: 4 mg via INTRAVENOUS
  Filled 2019-09-29: qty 2

## 2019-09-29 MED ORDER — OXYCODONE HCL 5 MG PO TABS
5.0000 mg | ORAL_TABLET | ORAL | Status: DC | PRN
  Administered 2019-09-30 – 2019-10-03 (×18): 10 mg via ORAL
  Filled 2019-09-29 (×18): qty 2

## 2019-09-29 MED ORDER — LACTATED RINGERS IV SOLN: INTRAVENOUS | Status: DC

## 2019-09-29 MED ORDER — TRANEXAMIC ACID 1000 MG/10ML IV SOLN
2000.0000 mg | Freq: Once | INTRAVENOUS | Status: DC
  Filled 2019-09-29: qty 20

## 2019-09-29 MED ORDER — LIDOCAINE HCL (CARDIAC) PF 100 MG/5ML IV SOSY
PREFILLED_SYRINGE | INTRAVENOUS | Status: DC | PRN
  Administered 2019-09-29: 80 mg via INTRAVENOUS

## 2019-09-29 MED ORDER — APIXABAN 5 MG PO TABS
5.0000 mg | ORAL_TABLET | Freq: Two times a day (BID) | ORAL | Status: DC
  Administered 2019-09-30 – 2019-10-03 (×7): 5 mg via ORAL
  Filled 2019-09-29 (×7): qty 1

## 2019-09-29 MED ORDER — PHENYLEPHRINE HCL-NACL 10-0.9 MG/250ML-% IV SOLN
INTRAVENOUS | Status: DC | PRN
  Administered 2019-09-29: 30 ug/min via INTRAVENOUS

## 2019-09-29 MED ORDER — ROPINIROLE HCL 1 MG PO TABS
1.0000 mg | ORAL_TABLET | Freq: Every day | ORAL | Status: DC
  Administered 2019-09-30 – 2019-10-02 (×3): 1 mg via ORAL
  Filled 2019-09-29 (×3): qty 2
  Filled 2019-09-29 (×5): qty 1

## 2019-09-29 MED ORDER — MORPHINE SULFATE (PF) 4 MG/ML IV SOLN
INTRAVENOUS | Status: AC
  Filled 2019-09-29: qty 2

## 2019-09-29 MED ORDER — FENTANYL CITRATE (PF) 100 MCG/2ML IJ SOLN
INTRAMUSCULAR | Status: AC
  Administered 2019-09-29: 14:00:00 100 ug via INTRAVENOUS
  Filled 2019-09-29: qty 2

## 2019-09-29 MED ORDER — CLONIDINE HCL (ANALGESIA) 100 MCG/ML EP SOLN
EPIDURAL | Status: AC
  Filled 2019-09-29: qty 10

## 2019-09-29 MED ORDER — SUGAMMADEX SODIUM 200 MG/2ML IV SOLN
INTRAVENOUS | Status: DC | PRN
  Administered 2019-09-29: 150 mg via INTRAVENOUS

## 2019-09-29 MED ORDER — MENTHOL 3 MG MT LOZG: 1.0000 | LOZENGE | OROMUCOSAL | Status: DC | PRN

## 2019-09-29 MED ORDER — DEXAMETHASONE SODIUM PHOSPHATE 10 MG/ML IJ SOLN
INTRAMUSCULAR | Status: DC | PRN
  Administered 2019-09-29: 10 mg via INTRAVENOUS

## 2019-09-29 MED ORDER — DEXAMETHASONE SODIUM PHOSPHATE 10 MG/ML IJ SOLN
INTRAMUSCULAR | Status: DC | PRN
  Administered 2019-09-29: 10 mg

## 2019-09-29 MED ORDER — LOSARTAN POTASSIUM 50 MG PO TABS
50.0000 mg | ORAL_TABLET | Freq: Every day | ORAL | Status: DC
  Filled 2019-09-29: qty 1

## 2019-09-29 MED ORDER — ROCURONIUM BROMIDE 100 MG/10ML IV SOLN
INTRAVENOUS | Status: DC | PRN
  Administered 2019-09-29: 40 mg via INTRAVENOUS
  Administered 2019-09-29: 60 mg via INTRAVENOUS

## 2019-09-29 MED ORDER — VANCOMYCIN HCL 1000 MG IV SOLR
INTRAVENOUS | Status: AC
  Filled 2019-09-29: qty 1000

## 2019-09-29 MED ORDER — MIDAZOLAM HCL 2 MG/2ML IJ SOLN
INTRAMUSCULAR | Status: AC
  Administered 2019-09-29: 2 mg via INTRAVENOUS
  Filled 2019-09-29: qty 2

## 2019-09-29 MED ORDER — HYDROMORPHONE HCL 1 MG/ML IJ SOLN
0.5000 mg | INTRAMUSCULAR | Status: DC | PRN
  Administered 2019-09-29 – 2019-10-02 (×9): 0.5 mg via INTRAVENOUS
  Filled 2019-09-29 (×10): qty 1

## 2019-09-29 MED ORDER — FENTANYL CITRATE (PF) 100 MCG/2ML IJ SOLN: 100.0000 ug | Freq: Once | INTRAMUSCULAR | Status: AC

## 2019-09-29 MED ORDER — ONDANSETRON HCL 4 MG/2ML IJ SOLN
INTRAMUSCULAR | Status: AC
  Filled 2019-09-29: qty 2

## 2019-09-29 MED ORDER — ACETAMINOPHEN 160 MG/5ML PO SOLN: 325.0000 mg | ORAL | Status: DC | PRN

## 2019-09-29 MED ORDER — BUPIVACAINE HCL (PF) 0.25 % IJ SOLN
INTRAMUSCULAR | Status: AC
  Filled 2019-09-29: qty 30

## 2019-09-29 MED ORDER — LEVOTHYROXINE SODIUM 75 MCG PO TABS
75.0000 ug | ORAL_TABLET | Freq: Every day | ORAL | Status: DC
  Administered 2019-09-30 – 2019-10-03 (×4): 75 ug via ORAL
  Filled 2019-09-29 (×4): qty 1

## 2019-09-29 MED ORDER — FENTANYL CITRATE (PF) 250 MCG/5ML IJ SOLN
INTRAMUSCULAR | Status: AC
  Filled 2019-09-29: qty 5

## 2019-09-29 MED ORDER — BUPIVACAINE LIPOSOME 1.3 % IJ SUSP
20.0000 mL | Freq: Once | INTRAMUSCULAR | Status: DC
  Filled 2019-09-29: qty 20

## 2019-09-29 MED ORDER — FENTANYL CITRATE (PF) 100 MCG/2ML IJ SOLN
25.0000 ug | INTRAMUSCULAR | Status: DC | PRN
  Administered 2019-09-29 (×3): 50 ug via INTRAVENOUS

## 2019-09-29 MED ORDER — MIDAZOLAM HCL 2 MG/2ML IJ SOLN: 2.0000 mg | Freq: Once | INTRAMUSCULAR | Status: AC

## 2019-09-29 MED ORDER — METOCLOPRAMIDE HCL 5 MG/ML IJ SOLN
5.0000 mg | Freq: Three times a day (TID) | INTRAMUSCULAR | Status: DC | PRN
  Administered 2019-09-29: 10 mg via INTRAVENOUS
  Filled 2019-09-29: qty 2

## 2019-09-29 MED ORDER — SODIUM CHLORIDE (PF) 0.9 % IJ SOLN
INTRAMUSCULAR | Status: DC | PRN
  Administered 2019-09-29: 20 mL

## 2019-09-29 MED ORDER — ONDANSETRON HCL 4 MG PO TABS
4.0000 mg | ORAL_TABLET | Freq: Four times a day (QID) | ORAL | Status: DC | PRN
  Administered 2019-09-30 – 2019-10-02 (×3): 4 mg via ORAL
  Filled 2019-09-29 (×3): qty 1

## 2019-09-29 MED ORDER — CLONIDINE HCL (ANALGESIA) 100 MCG/ML EP SOLN
EPIDURAL | Status: DC | PRN
  Administered 2019-09-29: 1 mL via INTRA_ARTICULAR

## 2019-09-29 MED ORDER — MEPERIDINE HCL 25 MG/ML IJ SOLN: 6.2500 mg | INTRAMUSCULAR | Status: DC | PRN

## 2019-09-29 MED ORDER — CHLORHEXIDINE GLUCONATE 4 % EX LIQD: 60.0000 mL | Freq: Once | CUTANEOUS | Status: DC

## 2019-09-29 MED ORDER — METOPROLOL TARTRATE 25 MG PO TABS
25.0000 mg | ORAL_TABLET | Freq: Two times a day (BID) | ORAL | Status: DC
  Administered 2019-09-30 – 2019-10-03 (×5): 25 mg via ORAL
  Filled 2019-09-29 (×7): qty 1

## 2019-09-29 MED ORDER — METHOCARBAMOL 1000 MG/10ML IJ SOLN
500.0000 mg | Freq: Four times a day (QID) | INTRAVENOUS | Status: DC | PRN
  Administered 2019-09-30: 500 mg via INTRAVENOUS
  Filled 2019-09-29 (×2): qty 5

## 2019-09-29 MED ORDER — BUPIVACAINE LIPOSOME 1.3 % IJ SUSP
INTRAMUSCULAR | Status: DC | PRN
  Administered 2019-09-29: 20 mL

## 2019-09-29 MED ORDER — ROPIVACAINE HCL 7.5 MG/ML IJ SOLN
INTRAMUSCULAR | Status: DC | PRN
  Administered 2019-09-29: 25 mL via PERINEURAL

## 2019-09-29 MED ORDER — ONDANSETRON HCL 4 MG/2ML IJ SOLN
INTRAMUSCULAR | Status: DC | PRN
  Administered 2019-09-29: 4 mg via INTRAVENOUS

## 2019-09-29 MED ORDER — TRANEXAMIC ACID 1000 MG/10ML IV SOLN
INTRAVENOUS | Status: DC | PRN
  Administered 2019-09-29: 17:00:00 2000 mg via TOPICAL

## 2019-09-29 MED ORDER — METHOCARBAMOL 500 MG PO TABS
500.0000 mg | ORAL_TABLET | Freq: Four times a day (QID) | ORAL | Status: DC | PRN
  Administered 2019-09-30 – 2019-10-03 (×7): 500 mg via ORAL
  Filled 2019-09-29 (×7): qty 1

## 2019-09-29 MED ORDER — OXYCODONE HCL 5 MG/5ML PO SOLN: 5.0000 mg | Freq: Once | ORAL | Status: AC | PRN

## 2019-09-29 MED ORDER — VANCOMYCIN HCL IN DEXTROSE 1-5 GM/200ML-% IV SOLN: 1000.0000 mg | INTRAVENOUS | Status: AC

## 2019-09-29 MED ORDER — VANCOMYCIN HCL 1000 MG IV SOLR
INTRAVENOUS | Status: DC | PRN
  Administered 2019-09-29: 1000 mg via TOPICAL

## 2019-09-29 MED ORDER — NAPROXEN 250 MG PO TABS
250.0000 mg | ORAL_TABLET | Freq: Two times a day (BID) | ORAL | Status: DC
  Administered 2019-09-30 – 2019-10-03 (×7): 250 mg via ORAL
  Filled 2019-09-29 (×8): qty 1

## 2019-09-29 MED ORDER — EPHEDRINE SULFATE 50 MG/ML IJ SOLN
INTRAMUSCULAR | Status: DC | PRN
  Administered 2019-09-29: 5 mg via INTRAVENOUS

## 2019-09-29 MED ORDER — TRANEXAMIC ACID-NACL 1000-0.7 MG/100ML-% IV SOLN
1000.0000 mg | INTRAVENOUS | Status: DC
  Filled 2019-09-29: qty 100

## 2019-09-29 MED ORDER — DEXMEDETOMIDINE HCL IN NACL 200 MCG/50ML IV SOLN
INTRAVENOUS | Status: DC | PRN
  Administered 2019-09-29: .4 ug/kg/h via INTRAVENOUS

## 2019-09-29 MED ORDER — POVIDONE-IODINE 10 % EX SWAB
2.0000 "application " | Freq: Once | CUTANEOUS | Status: AC
  Administered 2019-09-29: 2 via TOPICAL

## 2019-09-29 MED ORDER — GABAPENTIN 300 MG PO CAPS
300.0000 mg | ORAL_CAPSULE | Freq: Three times a day (TID) | ORAL | Status: DC
  Administered 2019-09-30 – 2019-10-03 (×2): 300 mg via ORAL
  Filled 2019-09-29 (×5): qty 1

## 2019-09-29 MED ORDER — DOCUSATE SODIUM 100 MG PO CAPS
100.0000 mg | ORAL_CAPSULE | Freq: Two times a day (BID) | ORAL | Status: DC
  Administered 2019-09-30 – 2019-10-03 (×6): 100 mg via ORAL
  Filled 2019-09-29 (×7): qty 1

## 2019-09-29 MED ORDER — SCOPOLAMINE 1 MG/3DAYS TD PT72
MEDICATED_PATCH | TRANSDERMAL | Status: AC
  Filled 2019-09-29: qty 1

## 2019-09-29 MED ORDER — DEXMEDETOMIDINE HCL IN NACL 200 MCG/50ML IV SOLN
INTRAVENOUS | Status: AC
  Filled 2019-09-29: qty 100

## 2019-09-29 MED ORDER — BUPIVACAINE HCL (PF) 0.25 % IJ SOLN
INTRAMUSCULAR | Status: DC | PRN
  Administered 2019-09-29: 20 mL via INTRA_ARTICULAR

## 2019-09-29 MED ORDER — ONDANSETRON HCL 4 MG/2ML IJ SOLN
4.0000 mg | Freq: Once | INTRAMUSCULAR | Status: AC | PRN
  Administered 2019-09-29: 4 mg via INTRAVENOUS

## 2019-09-29 MED ORDER — PHENOL 1.4 % MT LIQD: 1.0000 | OROMUCOSAL | Status: DC | PRN

## 2019-09-29 MED ORDER — PROPOFOL 10 MG/ML IV BOLUS
INTRAVENOUS | Status: AC
  Filled 2019-09-29: qty 20

## 2019-09-29 MED ORDER — ACETAMINOPHEN 325 MG PO TABS: 325.0000 mg | ORAL_TABLET | ORAL | Status: DC | PRN

## 2019-09-29 MED ORDER — OXYCODONE HCL 5 MG PO TABS
5.0000 mg | ORAL_TABLET | Freq: Once | ORAL | Status: AC | PRN
  Administered 2019-09-29: 5 mg via ORAL

## 2019-09-29 MED ORDER — SCOPOLAMINE 1 MG/3DAYS TD PT72
1.0000 | MEDICATED_PATCH | TRANSDERMAL | Status: DC
  Administered 2019-09-29 – 2019-10-02 (×2): 1.5 mg via TRANSDERMAL
  Filled 2019-09-29: qty 1

## 2019-09-29 MED ORDER — METOCLOPRAMIDE HCL 5 MG PO TABS: 5.0000 mg | ORAL_TABLET | Freq: Three times a day (TID) | ORAL | Status: DC | PRN

## 2019-09-29 MED ORDER — OXYCODONE HCL 5 MG PO TABS
ORAL_TABLET | ORAL | Status: AC
  Filled 2019-09-29: qty 1

## 2019-09-29 MED ORDER — FLUTICASONE PROPIONATE 50 MCG/ACT NA SUSP
2.0000 | Freq: Every day | NASAL | Status: DC
  Administered 2019-10-02 – 2019-10-03 (×2): 2 via NASAL
  Filled 2019-09-29: qty 16

## 2019-09-29 MED ORDER — IRRISEPT - 450ML BOTTLE WITH 0.05% CHG IN STERILE WATER, USP 99.95% OPTIME
TOPICAL | Status: DC | PRN
  Administered 2019-09-29: 16:00:00 450 mL

## 2019-09-29 MED ORDER — VANCOMYCIN HCL IN DEXTROSE 1-5 GM/200ML-% IV SOLN
INTRAVENOUS | Status: AC
  Administered 2019-09-29: 13:00:00 1000 mg via INTRAVENOUS
  Filled 2019-09-29: qty 200

## 2019-09-29 MED ORDER — VANCOMYCIN HCL IN DEXTROSE 1-5 GM/200ML-% IV SOLN
1000.0000 mg | Freq: Two times a day (BID) | INTRAVENOUS | Status: AC
  Administered 2019-09-29: 1000 mg via INTRAVENOUS
  Filled 2019-09-29 (×3): qty 200

## 2019-09-29 MED ORDER — TRANEXAMIC ACID-NACL 1000-0.7 MG/100ML-% IV SOLN
INTRAVENOUS | Status: AC
  Filled 2019-09-29: qty 100

## 2019-09-29 MED ORDER — 0.9 % SODIUM CHLORIDE (POUR BTL) OPTIME
TOPICAL | Status: DC | PRN
  Administered 2019-09-29 (×5): 1000 mL

## 2019-09-29 MED ORDER — FENTANYL CITRATE (PF) 100 MCG/2ML IJ SOLN
INTRAMUSCULAR | Status: DC | PRN
  Administered 2019-09-29: 50 ug via INTRAVENOUS
  Administered 2019-09-29: 100 ug via INTRAVENOUS
  Administered 2019-09-29 (×2): 50 ug via INTRAVENOUS
  Administered 2019-09-29: 100 ug via INTRAVENOUS
  Administered 2019-09-29: 150 ug via INTRAVENOUS

## 2019-09-29 SURGICAL SUPPLY — 79 items
BANDAGE ESMARK 6X9 LF (GAUZE/BANDAGES/DRESSINGS) ×1 IMPLANT
BASEPLATE TIBIAL UNV SZ3 (Orthopedic Implant) ×2 IMPLANT
BLADE LONG MED 31X9 (MISCELLANEOUS) ×1 IMPLANT
BLADE SAG 18X100X1.27 (BLADE) ×2 IMPLANT
BLADE SAW SGTL 13.0X1.19X90.0M (BLADE) ×2 IMPLANT
BLADE SURG 10 STRL SS (BLADE) ×4 IMPLANT
BNDG CMPR 9X6 STRL LF SNTH (GAUZE/BANDAGES/DRESSINGS) ×1
BNDG CMPR MED 10X6 ELC LF (GAUZE/BANDAGES/DRESSINGS) ×3
BNDG COHESIVE 6X5 TAN STRL LF (GAUZE/BANDAGES/DRESSINGS) ×2 IMPLANT
BNDG ELASTIC 4X5.8 VLCR STR LF (GAUZE/BANDAGES/DRESSINGS) ×2 IMPLANT
BNDG ELASTIC 6X10 VLCR STRL LF (GAUZE/BANDAGES/DRESSINGS) ×6 IMPLANT
BNDG ELASTIC 6X5.8 VLCR STR LF (GAUZE/BANDAGES/DRESSINGS) ×1 IMPLANT
BNDG ESMARK 6X9 LF (GAUZE/BANDAGES/DRESSINGS) ×2
BOWL SMART MIX CTS (DISPOSABLE) ×3 IMPLANT
BSPLAT TIB 3 UNV CMNT TL STAB (Orthopedic Implant) ×1 IMPLANT
CEMENT BONE SIMPLEX SPEEDSET (Cement) ×4 IMPLANT
CLOSURE STERI-STRIP 1/4X4 (GAUZE/BANDAGES/DRESSINGS) ×1 IMPLANT
COVER SURGICAL LIGHT HANDLE (MISCELLANEOUS) ×2 IMPLANT
COVER WAND RF STERILE (DRAPES) ×2 IMPLANT
CUFF TOURN SGL QUICK 34 (TOURNIQUET CUFF) ×2
CUFF TOURN SGL QUICK 42 (TOURNIQUET CUFF) IMPLANT
CUFF TRNQT CYL 34X4.125X (TOURNIQUET CUFF) ×1 IMPLANT
DRAPE INCISE IOBAN 66X45 STRL (DRAPES) IMPLANT
DRAPE ORTHO SPLIT 77X108 STRL (DRAPES) ×6
DRAPE SURG ORHT 6 SPLT 77X108 (DRAPES) ×3 IMPLANT
DRAPE U-SHAPE 47X51 STRL (DRAPES) ×2 IMPLANT
DRSG AQUACEL AG ADV 3.5X10 (GAUZE/BANDAGES/DRESSINGS) ×4 IMPLANT
DRSG AQUACEL AG ADV 3.5X14 (GAUZE/BANDAGES/DRESSINGS) ×1 IMPLANT
DURAPREP 26ML APPLICATOR (WOUND CARE) ×2 IMPLANT
ELECT REM PT RETURN 9FT ADLT (ELECTROSURGICAL) ×2
ELECTRODE REM PT RTRN 9FT ADLT (ELECTROSURGICAL) ×1 IMPLANT
FEMORAL PEG DISTAL FIXATION (Orthopedic Implant) ×2 IMPLANT
GAUZE SPONGE 4X4 12PLY STRL (GAUZE/BANDAGES/DRESSINGS) ×2 IMPLANT
GLOVE BIOGEL PI IND STRL 7.0 (GLOVE) IMPLANT
GLOVE BIOGEL PI IND STRL 8 (GLOVE) ×1 IMPLANT
GLOVE BIOGEL PI INDICATOR 7.0 (GLOVE)
GLOVE BIOGEL PI INDICATOR 8 (GLOVE) ×1
GLOVE ECLIPSE 7.0 STRL STRAW (GLOVE) IMPLANT
GLOVE ECLIPSE 8.0 STRL XLNG CF (GLOVE) ×2 IMPLANT
GOWN STRL REUS W/ TWL LRG LVL3 (GOWN DISPOSABLE) ×3 IMPLANT
GOWN STRL REUS W/ TWL XL LVL3 (GOWN DISPOSABLE) ×1 IMPLANT
GOWN STRL REUS W/TWL LRG LVL3 (GOWN DISPOSABLE) ×6
GOWN STRL REUS W/TWL XL LVL3 (GOWN DISPOSABLE) ×2
HANDPIECE INTERPULSE COAX TIP (DISPOSABLE) ×2
HOOD PEEL AWAY FACE SHEILD DIS (HOOD) ×2 IMPLANT
HOOD PEEL AWAY FLYTE STAYCOOL (MISCELLANEOUS) ×6 IMPLANT
IMMOBILIZER KNEE 22 UNIV (SOFTGOODS) ×2 IMPLANT
INSERT TIBIAL TRIATH PS X3 (Insert) ×1 IMPLANT
KIT BASIN OR (CUSTOM PROCEDURE TRAY) ×2 IMPLANT
KIT TURNOVER KIT B (KITS) ×2 IMPLANT
MANIFOLD NEPTUNE II (INSTRUMENTS) ×2 IMPLANT
NDL 18GX1X1/2 (RX/OR ONLY) (NEEDLE) IMPLANT
NEEDLE 18GX1X1/2 (RX/OR ONLY) (NEEDLE) ×2 IMPLANT
NEEDLE 22X1 1/2 (OR ONLY) (NEEDLE) ×4 IMPLANT
NS IRRIG 1000ML POUR BTL (IV SOLUTION) ×2 IMPLANT
PACK TOTAL JOINT (CUSTOM PROCEDURE TRAY) ×2 IMPLANT
PAD ARMBOARD 7.5X6 YLW CONV (MISCELLANEOUS) ×4 IMPLANT
PAD CAST 4YDX4 CTTN HI CHSV (CAST SUPPLIES) ×1 IMPLANT
PADDING CAST COTTON 4X4 STRL (CAST SUPPLIES) ×2
PADDING CAST COTTON 6X4 STRL (CAST SUPPLIES) ×4 IMPLANT
PATELLA 32MMX10MM (Knees) ×1 IMPLANT
PIN FLUTED HEDLESS FIX 3.5X1/8 (PIN) ×1 IMPLANT
POST STAB FEM TRIATHLON SZ2RT (Femur) ×2 IMPLANT
SET HNDPC FAN SPRY TIP SCT (DISPOSABLE) ×1 IMPLANT
SPONGE LAP 18X18 RF (DISPOSABLE) IMPLANT
STEM CEMENTED TRIATHLON (Stem) ×1 IMPLANT
SUCTION FRAZIER HANDLE 10FR (MISCELLANEOUS) ×2
SUCTION TUBE FRAZIER 10FR DISP (MISCELLANEOUS) ×1 IMPLANT
SUT MNCRL AB 3-0 PS2 27 (SUTURE) ×4 IMPLANT
SUT VIC AB 0 CT1 27 (SUTURE) ×12
SUT VIC AB 0 CT1 27XBRD ANBCTR (SUTURE) ×3 IMPLANT
SUT VIC AB 1 CT1 27 (SUTURE) ×18
SUT VIC AB 1 CT1 27XBRD ANBCTR (SUTURE) ×3 IMPLANT
SUT VIC AB 2-0 CT1 27 (SUTURE) ×10
SUT VIC AB 2-0 CT1 TAPERPNT 27 (SUTURE) ×2 IMPLANT
SYR 30ML LL (SYRINGE) ×2 IMPLANT
TOWEL GREEN STERILE (TOWEL DISPOSABLE) ×4 IMPLANT
TRAY FOLEY MTR SLVR 16FR STAT (SET/KITS/TRAYS/PACK) ×2 IMPLANT
WATER STERILE IRR 1000ML POUR (IV SOLUTION) ×6 IMPLANT

## 2019-09-29 NOTE — Transfer of Care (Signed)
Immediate Anesthesia Transfer of Care Note  Patient: Melissa Jordan  Procedure(s) Performed: right removal unicompartmental knee arthroplasty, conversion to total knee arthroplasty (Right Knee)  Patient Location: PACU  Anesthesia Type:General  Level of Consciousness: drowsy and patient cooperative appropriate responds to commands  Airway & Oxygen Therapy: Patient Spontanous Breathing  Post-op Assessment: Report given to RN and Post -op Vital signs reviewed and stable  Post vital signs: Reviewed and stable  Last Vitals:  Vitals Value Taken Time  BP 143/76 09/29/19 1916  Temp 36.6 C 09/29/19 1915  Pulse 75 09/29/19 1921  Resp 15 09/29/19 1921  SpO2 95 % 09/29/19 1921  Vitals shown include unvalidated device data.  Last Pain:  Vitals:   09/29/19 1915  TempSrc:   PainSc: 4       Patients Stated Pain Goal: 3 (09/29/19 1239)  Complications: No apparent anesthesia complications

## 2019-09-29 NOTE — Op Note (Signed)
NAME: Melissa Jordan, Melissa Jordan MEDICAL RECORD IR:[id number] ACCOUNT [id number] DATE OF BIRTH:[birth year] FACILITY: [facility] LOCATION: [facility] PHYSICIAN:Kijana Cromie Diamantina Providence, MD  OPERATIVE REPORT  DATE OF PROCEDURE:  09/29/2019  PREOPERATIVE DIAGNOSES:   1.  Painful right unicompartmental knee arthroplasty. 2.  Lateral compartment arthritis.  POSTOPERATIVE DIAGNOSES:   1.  Painful right unicompartmental knee arthroplasty. 2.  Lateral compartment arthritis.  PROCEDURE:  Removal of the unicompartmental knee replacement and conversion to Stryker total knee replacement with components 2, size 2 posterior stabilized cemented femur with size 3 universal baseplate with 12 x 50 mm stem, size 3 x 16 mm posterior  stabilized insert and 32 cemented 3-peg patella.  SURGEON:  Cammy Copa, MD  ASSISTANT:  Karenann Cai, PA-C  INDICATIONS:  This is a 74 year old patient with a 10-year history of having unicompartmental knee replacement placed on the right knee.  It has been painful for the last several years.  Plain radiographs demonstrated adjacent segment disease.  No fevers  or chills or systemic symptoms of infection.  She presents now for operative management after explanation of risks and benefits.  PROCEDURE IN DETAIL:  The patient was brought to the operating room where general anesthetic was induced.  Preoperative antibiotics administered.  Timeout was called.  Right leg prescrubbed with alcohol and Betadine, allowed to air dry, prepped with  DuraPrep solution and draped in a sterile manner.  Ioban used to cover the operative field.  The anterior approach to knee was made after elevating the leg and exsanguinating with the Esmarch.  Total tourniquet time 112 minutes at 300 mmHg.  Skin and  subcutaneous tissue were sharply divided.  Patella was everted.  The patient had significant effusion in her knee, which was sent for culture, but it did not in any way appear infected.  Very clear serous fluid.   The patient had severe end-stage  arthritis in the lateral compartment.  The medial components did not appear to be overtly loose.  The tibial component was slightly externally rotated.  Using a very thin oscillating saw and bone preserving technique, the femoral and tibial components  were removed.  Intramedullary alignment was then used to make a cut on the tibia.  This was made about 2 mm below the cement mantle which was left from the tibial implant.  The ACL was removed.  The posterior structures and collaterals were protected.   Next, the tibial cut was made.  A very bone sparing cut was made.  Then the femur was cut.  Using intramedullary alignment in 5 degrees of valgus, the femur was resected.  There was deficiency of the posterior medial femoral condyle, which was moderate.   The anterior, posterior and chamfer cuts were made.  Rotational alignment was made using both Whitesides line as well as the epicondylar access.  We also made sure there was a symmetric flexion gap at 90 degrees with rotation.  Next, after making the  anterior, posterior chamfer cuts, the trial component was placed after making the box cut.  The tibia was keel punched.  A size 3 tibia fit nicely on the tibial plateau.  The patient was able to achieve full extension and excellent flexion with the 16 mm  spacer in place.  Knee was stable to varus and valgus stress at 0, 30 and 90 degrees.  Patella was then cut down from 24 to 12 mm and 35, 3-peg trial patella was placed.  The patient had excellent patellar tracking, only minimal liftoff beyond 120  degrees of flexion.  The knee was well balanced.  Trial components were removed.  Thorough irrigation was performed.  A solution of Marcaine, saline and Exparel was injected into the capsule.  Tranexamic acid sponge was placed within the incision and  left there for 3 minutes, along with IrriSept solution, which was utilized  both after the incision and after the arthrotomy and at  multiple times during the case.  This was done to prevent infection.  After that, a thorough irrigation was performed and  the components were cemented into position.  Excess cement was removed under direct visualization.  Same stability parameters were maintained.  At this time, tourniquet was released.  Bleeding points encountered were controlled using electrocautery.   Pouring irrigation utilized at this point, along with IrriSept solution.  Vancomycin powder was then placed within the joint and the arthrotomy was closed using #1 Vicryl suture with the knee over a bolster.  Then, it was closed using a 0 Vicryl suture,  2-0 Vicryl suture and a 3-0 Monocryl.  Aquacel dressing placed, along with knee immobilizer.  A bulky wrap was also placed for compression.  This was placed very loosely.  The knee joint prior to taking the drapes down was injected with a solution of  Marcaine and clonidine for postop pain relief.  The patient tolerated the procedure well without immediate complication.  Luke's assistance was required at all times for retraction, opening and closing, mobilization of tissues and overall limb alignment  and sawing.  His assistance was a medical necessity.  VN/NUANCE  D:09/29/2019 T:09/29/2019 JOB:[id number]

## 2019-09-29 NOTE — Anesthesia Postprocedure Evaluation (Signed)
Anesthesia Post Note  Patient: Melissa Jordan  Procedure(s) Performed: right removal unicompartmental knee arthroplasty, conversion to total knee arthroplasty (Right Knee)     Patient location during evaluation: PACU Anesthesia Type: General and Regional Level of consciousness: awake Pain management: pain level controlled Vital Signs Assessment: post-procedure vital signs reviewed and stable Respiratory status: spontaneous breathing, nonlabored ventilation, respiratory function stable and patient connected to nasal cannula oxygen Cardiovascular status: blood pressure returned to baseline and stable Postop Assessment: no apparent nausea or vomiting Anesthetic complications: no    Last Vitals:  Vitals:   09/29/19 2100 09/29/19 2115  BP: 104/82   Pulse: 83 63  Resp: 19 16  Temp:    SpO2: 96% 93%    Last Pain:  Vitals:   09/29/19 2115  TempSrc:   PainSc: 6                  Allisson Schindel P Rolin Schult

## 2019-09-29 NOTE — Anesthesia Procedure Notes (Signed)
Procedure Name: Intubation Date/Time: 09/29/2019 3:44 PM Performed by: Ameira Alessandrini T, CRNA Pre-anesthesia Checklist: Patient identified, Emergency Drugs available, Suction available and Patient being monitored Patient Re-evaluated:Patient Re-evaluated prior to induction Oxygen Delivery Method: Circle system utilized Preoxygenation: Pre-oxygenation with 100% oxygen Induction Type: IV induction Ventilation: Mask ventilation without difficulty Laryngoscope Size: Mac and 3 Grade View: Grade III Tube type: Oral Tube size: 7.5 mm Number of attempts: 1 Airway Equipment and Method: Stylet and Oral airway Placement Confirmation: ETT inserted through vocal cords under direct vision,  positive ETCO2 and breath sounds checked- equal and bilateral Secured at: 21 cm Tube secured with: Tape Dental Injury: Teeth and Oropharynx as per pre-operative assessment
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
## 2019-09-29 NOTE — Brief Op Note (Signed)
   09/29/2019  7:11 PM  PATIENT:  Melissa Jordan  74 y.o. female  PRE-OPERATIVE DIAGNOSIS:  painful unicompartmental arthroplasty right knee  POST-OPERATIVE DIAGNOSIS:  painful unicompartmental arthroplasty right knee with lateral compartment arthritis  PROCEDURE:  Procedure(s): right removal unicompartmental knee arthroplasty, conversion to total knee arthroplasty  SURGEON:  Surgeon(s): Cammy Copa, MD  ASSISTANT: Magnant PA  ANESTHESIA:   general  EBL: 200 ml    No intake/output data recorded.  BLOOD ADMINISTERED: none  DRAINS: none   LOCAL MEDICATIONS USED: Marcaine clonidine Exparel  SPECIMEN:  No Specimen  COUNTS:  YES  TOURNIQUET:   Total Tourniquet Time Documented: Thigh (Right) - 112 minutes Total: Thigh (Right) - 112 minutes   DICTATION: .Other Dictation: Dictation Number [id number]  PLAN OF CARE: Admit to inpatient   PATIENT DISPOSITION:  PACU - hemodynamically stable

## 2019-09-29 NOTE — Anesthesia Procedure Notes (Signed)
Anesthesia Regional Block: Adductor canal block   Pre-Anesthetic Checklist: ,, timeout performed, Correct Patient, Correct Site, Correct Laterality, Correct Procedure, Correct Position, site marked, Risks and benefits discussed,  Surgical consent,  Pre-op evaluation,  At surgeon's request and post-op pain management  Laterality: Right  Prep: chloraprep       Needles:  Injection technique: Single-shot  Needle Type: Echogenic Stimulator Needle     Needle Length: 5cm  Needle Gauge: 22     Additional Needles:   Procedures:, nerve stimulator,,, ultrasound used (permanent image in chart),,,,   Nerve Stimulator or Paresthesia:  Response: quadraceps contraction, 0.45 mA,   Additional Responses:   Narrative:  Start time: 09/29/2019 1:33 PM End time: 09/29/2019 1:38 PM Injection made incrementally with aspirations every 5 mL.  Performed by: Personally  Anesthesiologist: Bethena Midget, MD  Additional Notes: Functioning IV was confirmed and monitors were applied.  A 51mm 22ga Arrow echogenic stimulator needle was used. Sterile prep and drape,hand hygiene and sterile gloves were used. Ultrasound guidance: relevant anatomy identified, needle position confirmed, local anesthetic spread visualized around nerve(s)., vascular puncture avoided.  Image printed for medical record. Negative aspiration and negative test dose prior to incremental administration of local anesthetic. The patient tolerated the procedure well.

## 2019-09-30 DIAGNOSIS — R079 Chest pain, unspecified: Secondary | ICD-10-CM

## 2019-09-30 DIAGNOSIS — I480 Paroxysmal atrial fibrillation: Secondary | ICD-10-CM

## 2019-09-30 LAB — CBC WITH DIFFERENTIAL/PLATELET
Abs Immature Granulocytes: 0.07 10*3/uL (ref 0.00–0.07)
Basophils Absolute: 0 10*3/uL (ref 0.0–0.1)
Basophils Relative: 0 %
Eosinophils Absolute: 0 10*3/uL (ref 0.0–0.5)
Eosinophils Relative: 0 %
HCT: 31.9 % — ABNORMAL LOW (ref 36.0–46.0)
Hemoglobin: 10.6 g/dL — ABNORMAL LOW (ref 12.0–15.0)
Immature Granulocytes: 1 %
Lymphocytes Relative: 6 %
Lymphs Abs: 0.8 10*3/uL (ref 0.7–4.0)
MCH: 29.4 pg (ref 26.0–34.0)
MCHC: 33.2 g/dL (ref 30.0–36.0)
MCV: 88.6 fL (ref 80.0–100.0)
Monocytes Absolute: 1.3 10*3/uL — ABNORMAL HIGH (ref 0.1–1.0)
Monocytes Relative: 10 %
Neutro Abs: 11 10*3/uL — ABNORMAL HIGH (ref 1.7–7.7)
Neutrophils Relative %: 83 %
Platelets: 229 10*3/uL (ref 150–400)
RBC: 3.6 MIL/uL — ABNORMAL LOW (ref 3.87–5.11)
RDW: 14 % (ref 11.5–15.5)
WBC: 13.1 10*3/uL — ABNORMAL HIGH (ref 4.0–10.5)
nRBC: 0 % (ref 0.0–0.2)

## 2019-09-30 LAB — BASIC METABOLIC PANEL
Anion gap: 10 (ref 5–15)
BUN: 12 mg/dL (ref 8–23)
CO2: 24 mmol/L (ref 22–32)
Calcium: 8.3 mg/dL — ABNORMAL LOW (ref 8.9–10.3)
Chloride: 104 mmol/L (ref 98–111)
Creatinine, Ser: 0.85 mg/dL (ref 0.44–1.00)
GFR calc Af Amer: >60 mL/min (ref >60)
GFR calc non Af Amer: >60 mL/min (ref >60)
Glucose, Bld: 126 mg/dL — ABNORMAL HIGH (ref 70–99)
Potassium: 3.8 mmol/L (ref 3.5–5.1)
Sodium: 138 mmol/L (ref 135–145)

## 2019-09-30 LAB — TROPONIN I (HIGH SENSITIVITY): Troponin I (High Sensitivity): 4 ng/L (ref <18)

## 2019-09-30 LAB — MAGNESIUM: Magnesium: 1.7 mg/dL (ref 1.7–2.4)

## 2019-09-30 MED ORDER — SODIUM CHLORIDE 0.9 % IV BOLUS
250.0000 mL | Freq: Once | INTRAVENOUS | Status: AC
  Administered 2019-09-30: 250 mL via INTRAVENOUS

## 2019-09-30 NOTE — Progress Notes (Signed)
  Subjective: Patient stable.  Pain reasonably well controlled.  Patient did have what sounds like a 10-minute episode of atrial fibrillation last night.  EKG currently normal.  Heart rate is 99.  Labs are pending.   Objective: Vital signs in last 24 hours: Temp:  [97.4 F (36.3 C)-99.1 F (37.3 C)] 99.1 F (37.3 C) (04/09 0756) Pulse Rate:  [55-87] 67 (04/09 0756) Resp:  [11-100] 18 (04/09 0756) BP: (91-161)/(41-82) 99/46 (04/09 0756) SpO2:  [93 %-100 %] 99 % (04/09 0756) Weight:  [68 kg] 68 kg (04/08 1202)  Intake/Output from previous day: 04/08 0701 - 04/09 0700 In: 1817 [P.O.:360; I.V.:1207; IV Piggyback:250] Out: 1200 [Urine:1000; Blood:200] Intake/Output this shift: No intake/output data recorded.  Exam:  Dorsiflexion/Plantar flexion intact Compartment soft  Labs: No results for input(s): HGB in the last 72 hours. No results for input(s): WBC, RBC, HCT, PLT in the last 72 hours. No results for input(s): NA, K, CL, CO2, BUN, CREATININE, GLUCOSE, CALCIUM in the last 72 hours. Recent Labs    09/29/19 1154  INR 1.0    Assessment/Plan: Plan at this time is cardiac consultation.  She was off amiodarone for several months prior to this total knee replacement.  She may need to go back on some type of medication for that.  We do plan to start her back on her Eliquis today.   G Scott Raegan Sipp 09/30/2019, 8:22 AM

## 2019-09-30 NOTE — Progress Notes (Signed)
Normal saline bolus given as ordered- No complaints per patient at this time.  Patient was made aware of new order for fluid bolus.

## 2019-09-30 NOTE — Plan of Care (Signed)
  Problem: Education: Goal: Knowledge of General Education information will improve Description: Including pain rating scale, medication(s)/side effects and non-pharmacologic comfort measures Outcome: Progressing   Problem: Nutrition: Goal: Adequate nutrition will be maintained Outcome: Progressing   Problem: Safety: Goal: Ability to remain free from injury will improve Outcome: Progressing   

## 2019-09-30 NOTE — Evaluation (Signed)
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 August Saucer 4/8. PMH includes TKA 10 yrs ago with prior revision, L TKA in 2017, multiple back sx, anemia, HTN, and prior n/v and soft BP following surgery.  Clinical Impression  Pt in bed upon arrival of PT, agreeable to evaluation at this time. Prior to admission the pt was independent without use of AD, living with her husband in a home with 11 steps to enter. The pt now presents with limitations in functional mobility, endurance, power, and functional strength/ROM due to above dx and resulting pain, and will continue to benefit from skilled PT to address these deficits. The pt was able to demo good tolerance for short ambulation and transfers this morning, but will continue to benefit from therapy to progress stair training as pt has 11 steps to enter her home.      Follow Up Recommendations Home health PT;Supervision/Assistance - 24 hour(discussed HHPT vs OPPT based on pt progression with therapy)    Equipment Recommendations  None recommended by PT(pt has needed equipment)    Recommendations for Other Services       Precautions / Restrictions Precautions Precautions: Fall Restrictions Weight Bearing Restrictions: Yes RLE Weight Bearing: Weight bearing as tolerated      Mobility  Bed Mobility Overal bed mobility: Needs Assistance Bed Mobility: Supine to Sit     Supine to sit: Supervision     General bed mobility comments: no assist needed RLE, HOB elevated pt did not use bed rails  Transfers Overall transfer level: Needs assistance Equipment used: Rolling walker (2 wheeled) Transfers: Sit to/from Stand Sit to Stand: Supervision         General transfer comment: VCs for hand placement, no  assist needed  Ambulation/Gait Ambulation/Gait assistance: Supervision Gait Distance (Feet): 15 Feet(15 ft x 2) Assistive device: Rolling walker (2 wheeled) Gait Pattern/deviations: Step-to pattern;Decreased stride length;Antalgic   Gait velocity interpretation: <1.31 ft/sec, indicative of household ambulator General Gait Details: pt with slow but steady gait, no assist needed, good RW management  Stairs            Wheelchair Mobility    Modified Rankin (Stroke Patients Only)       Balance Overall balance assessment: Needs assistance Sitting-balance support: No upper extremity supported;Feet supported Sitting balance-Leahy Scale: Good     Standing balance support: Bilateral upper extremity supported;During functional activity Standing balance-Leahy Scale: Fair Standing balance comment: able to static stand without assist, BUE support for ambulation                             Pertinent Vitals/Pain Pain Assessment: 0-10 Pain Score: 3  Pain Descriptors / Indicators: Sore Pain Intervention(s): Limited activity within patient's tolerance;Monitored during session;Premedicated before session;Repositioned    Home Living Family/patient expects to be discharged to:: Private residence Living Arrangements: Spouse/significant other Available Help at Discharge: Family;Available 24 hours/day Type of Home: House Home Access: Stairs to enter Entrance Stairs-Rails: Doctor, general practice of Steps: 11 Home Layout: Two level;Laundry or work area in [employer] Equipment: Gilmer Mor - single point;Shower seat;Bedside commode;Grab bars - tub/shower;Walker - 2 wheels      Prior Function Level of Independence: Independent         Comments: retired Oncologist  Hand Dominance   Dominant Hand: Right    Extremity/Trunk Assessment   Upper Extremity Assessment Upper Extremity Assessment: Overall WFL for tasks assessed    Lower Extremity  Assessment Lower Extremity Assessment: Overall WFL for tasks assessed;RLE deficits/detail RLE Deficits / Details: R TKA, WBAT RLE: Unable to fully assess due to pain    Cervical / Trunk Assessment Cervical / Trunk Assessment: Normal  Communication   Communication: No difficulties  Cognition Arousal/Alertness: Awake/alert Behavior During Therapy: WFL for tasks assessed/performed Overall Cognitive Status: Within Functional Limits for tasks assessed                                        General Comments      Exercises     Assessment/Plan    PT Assessment Patient needs continued PT services  PT Problem List Decreased strength;Decreased mobility;Decreased coordination;Decreased range of motion;Decreased activity tolerance;Decreased balance;Decreased knowledge of use of DME;Pain       PT Treatment Interventions DME instruction;Therapeutic exercise;Gait training;Balance training;Therapeutic activities;Patient/family education;Functional mobility training;Stair training    PT Goals (Current goals can be found in the Care Plan section)  Acute Rehab PT Goals Patient 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 PT Goal Formulation: With patient Time For Goal Achievement: 10/14/19 Potential to Achieve Goals: Good    Frequency 7X/week   Barriers to discharge Inaccessible home environment pt has 11 steps to enter, may be able to navigate with continued therapy progression    Co-evaluation               AM-PAC PT "6 Clicks" Mobility  Outcome Measure Help needed turning from your back to your side while in a flat bed without using bedrails?: A Little Help needed moving from lying on your back to sitting on the side of a flat bed without using bedrails?: A Little Help needed moving to and from a bed to a chair (including a wheelchair)?: A Little Help needed standing up from a chair using your arms (e.g., wheelchair or bedside chair)?:  A Little Help needed to walk in hospital room?: A Little Help needed climbing 3-5 steps with a railing? : A Lot 6 Click Score: 17    End of Session Equipment Utilized During Treatment: Gait belt Activity Tolerance: Patient tolerated treatment well Patient left: in chair;with chair alarm set;with family/visitor present;with call bell/phone within reach Nurse Communication: Mobility status PT Visit Diagnosis: Difficulty in walking, not elsewhere classified (R26.2);Muscle weakness (generalized) (M62.81);Pain Pain - Right/Left: Right Pain - part of body: Knee    Time: 8657-8469 PT Time Calculation (min) (ACUTE ONLY): 27 min   Charges:   PT Evaluation $PT Eval Low Complexity: 1 Low $PT Eval Moderate Complexity: 1 Mod PT Treatments $Gait Training: 8-22 mins        Karma Ganja, PT, DPT   Acute Rehabilitation Department Pager #: [phone]  Otho Bellows 09/30/2019, 1:33 PM

## 2019-09-30 NOTE — Progress Notes (Signed)
  Subjective: Melissa Jordan is a 74 y.o. female s/p right conversion to TKA.  They are POD1.  Pt's pain is controlled.  Pt denies numbness/tingling/weakness.  Pt has ambulated with some difficulty but notes that she developed dizziness with ambulation earlier.  She did have an episode of chest pain, SOB, palpitations this morning but this has resolved and she feels well now.  No further episodes. She has a history of atrial fibrillation and her cardiologist is Dr. Debbe Bales.  She was recently taken off her amiodarone due to her many side effects. Has follow-up appointment with cardiology in May to discuss further management of A-fib.     Objective: Vital signs in last 24 hours: Temp:  [97.4 F (36.3 C)-99.1 F (37.3 C)] 99 F (37.2 C) (04/09 1318) Pulse Rate:  [60-87] 70 (04/09 1326) Resp:  [11-19] 18 (04/09 1318) BP: (88-143)/(41-82) 88/44 (04/09 1318) SpO2:  [93 %-100 %] 98 % (04/09 1326)  Intake/Output from previous day: 04/08 0701 - 04/09 0700 In: 1817 [P.O.:360; I.V.:1207; IV Piggyback:250] Out: 1200 [Urine:1000; Blood:200] Intake/Output this shift: No intake/output data recorded.  Exam:  No gross blood or drainage overlying the dressing 1+ DP pulse Sensation intact distally in the right foot Able to dorsiflex and plantarflex the right foot   Labs: Recent Labs    09/30/19 0810  HGB 10.6*   Recent Labs    09/30/19 0810  WBC 13.1*  RBC 3.60*  HCT 31.9*  PLT 229   Recent Labs    09/30/19 0810  NA 138  K 3.8  CL 104  CO2 24  BUN 12  CREATININE 0.85  GLUCOSE 126*  CALCIUM 8.3*   Recent Labs    09/29/19 1154  INR 1.0    Assessment/Plan: Pt is POD1 s/p right conversion to TKA.    -Plan to discharge to home in coming days pending patient's pain, PT eval, and cardiology recommendations  -Cardiology consult placed and spoke with cardiologist on call who agreed to see patient inpatient this afternoon. Appreciate their input. Troponin WNL  -WBAT with a  walker  -Needs CPM at home, will follow-up     Julieanne Cotton 09/30/2019, 2:09 PM

## 2019-09-30 NOTE — Progress Notes (Signed)
Report received from [place], [place]- reports that awaiting for rapid response nurse.  The patient was resting in the bed.  Alert and oriented.  Denies any type of chest pain at this time.  Color is pale but warm and dry.  EKG repeated

## 2019-09-30 NOTE — Progress Notes (Signed)
Per Dr. Magnus Ivan labs needs to be ordered (CBC with differential; Troponin level; Magnesium level; BMP); pt needs to be on Telemetry monitoring, bolus of 250 mL of 0.9% NaCl needs to be given. All of these interventions are implemented. Dr. Diamantina Providence PA Karenann Cai is notified as well.  Pt is alert, VS WDL except for the low BP 091/98. Pt feels better.

## 2019-09-30 NOTE — Progress Notes (Signed)
Around 0650 pt developed SOB, chest pain/ "pressure" (4/10) with the HR up to 140. Pt is dizzy, her hands are shaking; resp rate 18. Full set of VS are obtained; ECG is done. RRT and MD are notified. Waiting for the call back
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
## 2019-09-30 NOTE — Progress Notes (Signed)
Pt is in a lot of pain. Also has some nausea and vomiting. Pt is not able to tolerate CPM at this time. Bone foam is ordered. Per ortho tech there is none at [facility], will have to get one from [facility].

## 2019-09-30 NOTE — Progress Notes (Signed)
Orthopedic Tech Progress Note Patient Details:  Melissa Jordan 29-Sep-1945 [id number]  CPM Right Knee CPM Right Knee: On Right Knee Flexion (Degrees): 40 Right Knee Extension (Degrees): 10  Post Interventions Patient Tolerated: Well Instructions Provided: Care of device  [name] 09/30/2019, 6:19 PM

## 2019-09-30 NOTE — Significant Event (Signed)
Rapid Response Event Note  Overview: Arrival Time: 0710 Event Type: Cardiac  Initial Focused Assessment: Per patient and RN:   patient complaining of chest tightness, sob and palpitations.  Per staff pulse ox monitor alarming for HR 140s.  12 lead EKG obtained at 0648 shows SR 77.    Upon my arrival patient is feeling much better, does not have palpitations or other symptoms.  Lung sounds clear.  Heart tones regular  BP 109/61  SR 77  RR 15  O2 sat 98% on 2L [place]  Interventions: 2nd 12 lead being done as I arrived.  SR 77 Labs ordered Cardiac telemetry placed 250 cc NS bolus  Plan of Care (if not transferred): RN to call if patient has additional tachycardia.  Event Summary: Name of Physician Notified: Dr Magnus Ivan  prior to my arrival    Outcome: Stayed in room and stabalized  Event End Time: 0750  Marcellina Millin

## 2019-09-30 NOTE — Plan of Care (Signed)

## 2019-09-30 NOTE — Progress Notes (Signed)
Physical Therapy Treatment Patient Details Name: Melissa Jordan MRN: [id number] DOB: 11-17-1945 Today's Date: 09/30/2019    History of Present Illness Pt is a 74 yo female presenting s/p right removal unicompartmental knee arthroplasty, conversion to total knee arthroplasty by Dr. Marlou Sa 4/8. PMH includes TKA 10 yrs ago with prior revision, L TKA in 2017, multiple back sx, anemia, HTN, and prior n/v and soft BP following surgery.    PT Comments    Pt in bed upon arrival of PT, agreeable to PT session with focus on functional LE exercises to progress strength and ROM. The pt was able to demo great tolerance for strengthening and ROM exercises, needing minimal assist to complete motions, especially with use of guided breathing techniques to reduce pain. The pt was also able to complete a short bout of ambulation in the room and transfers from multiple surfaces without assist. The pt will continue to benefit from skilled PT for further progression of mobility and ROM, as well as for stair training (pt has 11 steps to enter her home) prior to d/c home.     Follow Up Recommendations  Home health PT;Supervision/Assistance - 24 hour(discussed HHPT vs OPPT based on pt progression with therapy)     Equipment Recommendations  None recommended by PT(pt has needed equipment)    Recommendations for Other Services       Precautions / Restrictions Precautions Precautions: Fall Precaution Comments: soft BP Restrictions Weight Bearing Restrictions: Yes RLE Weight Bearing: Weight bearing as tolerated    Mobility  Bed Mobility Overal bed mobility: Needs Assistance Bed Mobility: Supine to Sit     Supine to sit: Supervision     General bed mobility comments: no assist needed RLE  Transfers Overall transfer level: Needs assistance Equipment used: Rolling walker (2 wheeled) Transfers: Sit to/from Stand Sit to Stand: Supervision         General transfer comment: VCs for hand placement, no  assist needed  Ambulation/Gait Ambulation/Gait assistance: Supervision Gait Distance (Feet): 15 Feet(x2) Assistive device: Rolling walker (2 wheeled) Gait Pattern/deviations: Step-to pattern;Decreased stride length;Antalgic   Gait velocity interpretation: <1.31 ft/sec, indicative of household ambulator General Gait Details: pt with slow but steady gait, no assist needed, good RW management   Stairs             Wheelchair Mobility    Modified Rankin (Stroke Patients Only)       Balance Overall balance assessment: Needs assistance Sitting-balance support: No upper extremity supported;Feet supported Sitting balance-Leahy Scale: Good     Standing balance support: Bilateral upper extremity supported;During functional activity Standing balance-Leahy Scale: Fair Standing balance comment: able to static stand without assist, BUE support for ambulation                            Cognition Arousal/Alertness: Awake/alert Behavior During Therapy: WFL for tasks assessed/performed Overall Cognitive Status: Within Functional Limits for tasks assessed                                        Exercises Total Joint Exercises Quad Sets: AROM;Both;10 reps;Supine Heel Slides: AAROM;Right;10 reps;Supine Hip ABduction/ADduction: AAROM;Right;10 reps;Supine Long Arc Quad: AAROM;Right;10 reps;Seated Knee Flexion: AAROM;Right;Seated;10 reps    General Comments        Pertinent Vitals/Pain Pain Assessment: 0-10 Pain Score: 6  Pain Descriptors / Indicators: Sore Pain Intervention(s): Limited  activity within patient's tolerance;Monitored during session;RN gave pain meds during session;Repositioned;Utilized relaxation techniques    Home Living Family/patient expects to be discharged to:: Private residence Living Arrangements: Spouse/significant other Available Help at Discharge: Family;Available 24 hours/day Type of Home: House Home Access: Stairs to  enter Entrance Stairs-Rails: Right;Left Home Layout: Two level;Laundry or work area in [employer] Equipment: Gilmer Mor - single point;Shower seat;Bedside commode;Grab bars - tub/shower;Walker - 2 wheels      Prior Function Level of Independence: Independent      Comments: retired Oncologist    PT Goals (current goals can now be found in the care plan section) Acute Rehab PT Goals Patient 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 PT Goal Formulation: With patient Time For Goal Achievement: 10/14/19 Potential to Achieve Goals: Good Progress towards PT goals: Progressing toward goals    Frequency    7X/week      PT Plan Current plan remains appropriate    Co-evaluation              AM-PAC PT "6 Clicks" Mobility   Outcome Measure  Help needed turning from your back to your side while in a flat bed without using bedrails?: A Little Help needed moving from lying on your back to sitting on the side of a flat bed without using bedrails?: A Little Help needed moving to and from a bed to a chair (including a wheelchair)?: A Little Help needed standing up from a chair using your arms (e.g., wheelchair or bedside chair)?: A Little Help needed to walk in hospital room?: A Little Help needed climbing 3-5 steps with a railing? : A Lot 6 Click Score: 17    End of Session Equipment Utilized During Treatment: Gait belt Activity Tolerance: Patient tolerated treatment well Patient left: in bed;with call bell/phone within reach;with bed alarm set Nurse Communication: Mobility status PT Visit Diagnosis: Difficulty in walking, not elsewhere classified (R26.2);Muscle weakness (generalized) (M62.81);Pain Pain - Right/Left: Right Pain - part of body: Knee     Time: 9211-9417 PT Time Calculation (min) (ACUTE ONLY): 40 min  Charges:  $Gait Training: 8-22 mins $Therapeutic Exercise: 23-37 mins                     [name], PT, DPT   Acute Rehabilitation  Department Pager #: [phone]   [name] 09/30/2019, 4:35 PM

## 2019-09-30 NOTE — TOC Initial Note (Addendum)
Transition of Care [facility]) - Initial/Assessment Note    Patient Details  Name: Melissa Jordan MRN: [id number] Date of Birth: [birth year]  [facility]) CM/SW Contact:    Curlene Labrum, RN Phone Number: 09/30/2019, 10:14 AM  Clinical Narrative:                 Case Management met with the patient and her husband at the bedside - S/P removal of unicompartmental knee arthroplasty - conversion to total knee arthroplasty.  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  Patient currently has a rolling walker and 3:1 at home.  Patient states she will definitely being discharging to home with home health most probably this weekend.    Patient given Medicare Choice regarding home health agencies - patient without a preference for home health choice.  [facility] and waiting on return call from [place].  Patient's primary care physician is Dr. Birdie Riddle.  Prescriptions are filled at [facility].  Will follow for any other discharge needs.  09/30/19 - [facility] unable to accept patient due to staffing.  Alvis Lemmings called and patient accepted and set up for PT [facility] for possible discharge from hospital tomorrow or Sunday 10/02/19.  No DME needed.  Expected Discharge Plan: [place] Barriers to Discharge: No Barriers Identified   Patient Goals and CMS Choice Patient states their goals for this hospitalization and ongoing recovery are:: Patient states she is feeling better and should be discharged this weekend as long as her heartrate and blood pressure are stable. CMS Medicare.gov Compare Post Acute Care list provided to:: Patient Choice offered to / list presented to : Patient  Expected Discharge Plan and Services Expected Discharge Plan: [place]   Discharge Planning Services: CM Consult   Living arrangements for the past 2 months: Single Family  Home                 DME Arranged: (patient presently has a rolling walker and 3:1 at home)                    Prior Living Arrangements/Services Living arrangements for the past 2 months: Single Family Home Lives with:: Spouse Patient language and need for interpreter reviewed:: Yes Do you feel safe going back to the place where you live?: Yes      Need for Family Participation in Patient Care: Yes (Comment) Care giver support system in place?: Yes (comment)   Criminal Activity/Legal Involvement Pertinent to Current Situation/Hospitalization: No - Comment as needed  Activities of Daily Living Home Assistive Devices/Equipment: Contact lenses, Blood pressure cuff, Grab bars in shower ADL Screening (condition at time of admission) Patient's cognitive ability adequate to safely complete daily activities?: Yes Is the patient deaf or have difficulty hearing?: No Does the patient have difficulty seeing, even when wearing glasses/contacts?: No Does the patient have difficulty concentrating, remembering, or making decisions?: No Patient able to express need for assistance with ADLs?: Yes Does the patient have difficulty dressing or bathing?: Yes Independently performs ADLs?: Yes (appropriate for developmental age) Does the patient have difficulty walking or climbing stairs?: Yes Weakness of Legs: Right Weakness of Arms/Hands: None  Permission Sought/Granted Permission sought to share information with : Case Manager       Permission granted to share info w AGENCY: Home health PT - no preference  Permission  granted to share info w Relationship: husband     Emotional Assessment Appearance:: Appears stated age Attitude/Demeanor/Rapport: Gracious Affect (typically observed): Accepting Orientation: : Oriented to Self, Oriented to Place, Oriented to  Time, Oriented to Situation Alcohol / Substance Use: Not Applicable Psych Involvement: No (comment)  Admission diagnosis:  S/P  total knee arthroplasty, right [Z96.651] Patient Active Problem List   Diagnosis Date Noted  . 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 Traumatic arthritis of right ankle   . Pain in right ankle and joints of right foot 08/05/2018  . A-fib ([place]) 03/10/2017  . 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 RLS (restless legs syndrome) 01/11/2015  . Osteopenia 05/15/2014  . Hyperlipidemia 05/24/2013  . Routine general medical examination at a health care facility 03/29/2012  . POSTMENOPAUSAL SYNDROME 09/26/2009  . URINARY URGENCY 09/26/2009  . ANEMIA 01/09/2009  . Back pain of lumbar region with sciatica 01/09/2009  . LAMINECTOMY, LUMBAR, HX OF 08/09/2008  . Hypothyroid 03/03/2007  . HYPERTENSION, BENIGN 03/03/2007  . GERD 02/10/2007   PCP:  Midge Minium, MD Pharmacy:   [facility] [address]), [place] [address] [place] [address] Phone: [phone] Fax: [phone]  [place] [address] [place] [address] Phone: [phone] Fax: [phone]  [id number] [id number]: [phone]: [phone]    Social Determinants of Health (SDOH) Interventions    Readmission Risk Interventions Readmission Risk Prevention Plan 09/30/2019 05/10/2019  Post Dischage Appt Complete Complete  Medication Screening Complete Complete  Transportation Screening Complete Complete  Some recent data might be hidden

## 2019-09-30 NOTE — Progress Notes (Signed)
Medicated as ordered for pain.  The patient denies any type of SOB or chest pain at this time.

## 2019-10-01 ENCOUNTER — Encounter: Payer: Self-pay | Admitting: *Deleted

## 2019-10-01 MED ORDER — DOCUSATE SODIUM 100 MG PO CAPS: 100.0000 mg | ORAL_CAPSULE | Freq: Two times a day (BID) | ORAL | 0 refills | Status: DC

## 2019-10-01 MED ORDER — GABAPENTIN 300 MG PO CAPS: 300.0000 mg | ORAL_CAPSULE | Freq: Three times a day (TID) | ORAL | 0 refills | Status: DC

## 2019-10-01 MED ORDER — TIZANIDINE HCL 2 MG PO TABS: 2.0000 mg | ORAL_TABLET | Freq: Three times a day (TID) | ORAL | 0 refills | Status: DC | PRN

## 2019-10-01 MED ORDER — MENTHOL 3 MG MT LOZG: 1.0000 | LOZENGE | OROMUCOSAL | 12 refills | Status: DC | PRN

## 2019-10-01 MED ORDER — OXYCODONE HCL 5 MG PO TABS: 5.0000 mg | ORAL_TABLET | ORAL | 0 refills | Status: DC | PRN

## 2019-10-01 NOTE — Progress Notes (Signed)
Spoke to both cardiology and ortho about holding pt's metoprolol. Pt's blood pressure 103/50, pulse 82.

## 2019-10-01 NOTE — Progress Notes (Signed)
Progress Note  Patient Name: Melissa Jordan Date of Encounter: 10/01/2019  Primary Cardiologist: Little Ishikawa, MD  Subjective   Significant knee pain today, not planning to discharge.  Telemetry suggests afib however my independent review suggest sinus with frequent PACs. Discussed with patient.  Inpatient Medications    Scheduled Meds: . apixaban  5 mg Oral BID  . docusate sodium  100 mg Oral BID  . fluticasone  2 spray Each Nare Daily  . gabapentin  300 mg Oral TID  . levothyroxine  75 mcg Oral Q0600  . metoprolol tartrate  25 mg Oral BID  . naproxen  250 mg Oral BID WC  . rOPINIRole  1 mg Oral QHS  . scopolamine  1 patch Transdermal Q72H   Continuous Infusions: . lactated ringers Stopped (09/30/19 1930)  . lactated ringers 75 mL/hr at 10/01/19 0516  . methocarbamol (ROBAXIN) IV Stopped (09/30/19 0130)   PRN Meds: HYDROmorphone (DILAUDID) injection, menthol-cetylpyridinium **OR** phenol, methocarbamol **OR** methocarbamol (ROBAXIN) IV, metoCLOPramide **OR** metoCLOPramide (REGLAN) injection, ondansetron **OR** ondansetron (ZOFRAN) IV, oxyCODONE   Vital Signs    Vitals:   09/30/19 1934 09/30/19 2200 10/01/19 0419 10/01/19 0835  BP: (!) 95/43 (!) 92/48 (!) 98/50 (!) 103/50  Pulse: 71 68 71 82  Resp:    16  Temp: 98.4 F (36.9 C)  98.2 F (36.8 C) 98.7 F (37.1 C)  TempSrc: Oral  Oral Oral  SpO2: 97%  97% 99%  Weight:      Height:        Intake/Output Summary (Last 24 hours) at 10/01/2019 1207 Last data filed at 09/30/2019 1208 Gross per 24 hour  Intake 240 ml  Output --  Net 240 ml   Last 3 Weights 09/29/2019 09/20/2019 06/29/2019  Weight (lbs) 150 lb 156 lb 3.2 oz 153 lb  Weight (kg) 68.04 kg 70.852 kg 69.4 kg      Telemetry    Sinus rhythm with PACs - Personally Reviewed  ECG    No new - Personally Reviewed  Physical Exam   GEN: No acute distress.   Neck: No JVD Cardiac: RRR, no murmurs, rubs, or gallops.  Respiratory: Clear to  auscultation bilaterally. GI: Soft, nontender, non-distended  MS: No edema; R knee is dressed Neuro:  Nonfocal  Psych: Normal affect   Labs    High Sensitivity Troponin:   Recent Labs  Lab 09/30/19 0810  TROPONINIHS 4      Chemistry Recent Labs  Lab 09/30/19 0810  NA 138  K 3.8  CL 104  CO2 24  GLUCOSE 126*  BUN 12  CREATININE 0.85  CALCIUM 8.3*  GFRNONAA >60  GFRAA >60  ANIONGAP 10     Hematology Recent Labs  Lab 09/30/19 0810  WBC 13.1*  RBC 3.60*  HGB 10.6*  HCT 31.9*  MCV 88.6  MCH 29.4  MCHC 33.2  RDW 14.0  PLT 229    BNPNo results for input(s): BNP, PROBNP in the last 168 hours.   DDimer No results for input(s): DDIMER in the last 168 hours.   Radiology    No results found.  Cardiac Studies   none Patient Profile     Melissa Jordan is a 74 y.o. female with a PMH of paroxysmal atrial fibrillation, HTN, asthma, GERD, and recent knee replacement surgery 09/29/19 who is being seen today for the evaluation of chest pain at the request of Dr. August Saucer.  Assessment & Plan   1. Chest pain: patient experienced chest  pressure in the setting of tachycardia to the 140s. Episode lasted a few minutes before resolving spontaneously. Felt c/w prior afib episodes. EKG is without ischemic changes. HsTrop was 4.  -likely secondary to afib -may need elective ischemic workup with Dr. Gardiner Rhyme, particularly to rule out structural heart disease in setting of antiarrhythmic decisions.   2. Paroxysmal atrial fibrillation: episode of chest pain sounds consistent with a brief episode of Afib with RVR though not captured on EKG and she was not on telemetry at the time. HR reported to be up to 140s. BP has been soft. - Continue eliquis for stroke ppx - Continue metoprolol for rate control. Can consider additional metoprolol dose if RVR reoccurs. -metoprolol held today due to hypotension.   3. HTN: BP soft this admission, likely 2/2 blood loss following knee replacement  surgery 09/29/19.  - Continue metoprolol as BP will allow - Favor holding losartan in the setting of hypotension to allow room for BBlocker for management of #2. Anticipate resuming losartan as BP will allow  4. S/p right knee replacement: uneventful surgery 09/29/19. - Continue management per ortho  She is scheduled to see Rosaria Ferries, PA-C 10/14/19 for outpatient follow-up.   I will see the patient again tomorrow to assist as needed.    For questions or updates, please contact [place] Please consult [email or web address] for contact info under        Signed, Elouise Munroe, MD  10/01/2019, 12:07 PM

## 2019-10-01 NOTE — Progress Notes (Signed)
Patient stable.  Pain better controlled now than this morning. On exam there is mild swelling around the knee.  No calf tenderness.  Plan is physical therapy this afternoon to begin working the stairs.  More therapy tomorrow morning to continue working with stairs.  Anticipate discharge late tomorrow morning early in the afternoon.  Patient is on Eliquis for DVT prophylaxis.  Encouraged her to take medicine for pain on a regular basis as opposed to an as-needed basis at least for the first several days.

## 2019-10-01 NOTE — Progress Notes (Signed)
Subjective: 2 Days Post-Op Procedure(s) (LRB): right removal unicompartmental knee arthroplasty, conversion to total knee arthroplasty (Right) Patient reports pain as severe today in right knee. Denies any chest pain or SOB. Concerned about 11 steps at home.   Objective: Vital signs in last 24 hours: Temp:  [98.2 F (36.8 C)-99 F (37.2 C)] 98.7 F (37.1 C) (04/10 0835) Pulse Rate:  [65-82] 82 (04/10 0835) Resp:  [16-18] 16 (04/10 0835) BP: (87-103)/(43-54) 103/50 (04/10 0835) SpO2:  [97 %-100 %] 99 % (04/10 0835)  Intake/Output from previous day: 04/09 0701 - 04/10 0700 In: 240 [P.O.:240] Out: -  Intake/Output this shift: No intake/output data recorded.  Recent Labs    09/30/19 0810  HGB 10.6*   Recent Labs    09/30/19 0810  WBC 13.1*  RBC 3.60*  HCT 31.9*  PLT 229   Recent Labs    09/30/19 0810  NA 138  K 3.8  CL 104  CO2 24  BUN 12  CREATININE 0.85  GLUCOSE 126*  CALCIUM 8.3*   Recent Labs    09/29/19 1154  INR 1.0    Dorsiflexion/Plantar flexion intact Incision: dressing C/D/I Compartment soft   Assessment/Plan: 2 Days Post-Op Procedure(s) (LRB): right removal unicompartmental knee arthroplasty, conversion to total knee arthroplasty (Right) Up with therapy  Possible discharge to home tomorrow.  Cardiology saw patient and recommend no further workup at this time patient to see cardiology in outpatient setting.      Eean Buss 10/01/2019, 10:23 AM

## 2019-10-01 NOTE — Progress Notes (Signed)
Physical Therapy Treatment Patient Details Name: Melissa Jordan MRN: [id number] DOB: 1946/02/18 Today's Date: 10/01/2019    History of Present Illness Pt is a 74 yo female presenting s/p right removal unicompartmental knee arthroplasty, conversion to total knee arthroplasty by Dr. August Saucer 4/8. PMH includes TKA 10 yrs ago with prior revision, L TKA in 2017, multiple back sx, anemia, HTN, and prior n/v and soft BP following surgery.    PT Comments    Pt got OOB to [facility] and then to recliner. She reports pain being out of control earlier today - bringing her to tears - she is just now getting pain under control.  Talked about pain meds and ice and the need to walk to help pain management.  Pt did limited exercises this morning.  We will progress exercises and start stair education this afternoon.   Follow Up Recommendations  Home health PT;Supervision/Assistance - 24 hour     Equipment Recommendations  None recommended by PT    Recommendations for Other Services       Precautions / Restrictions Precautions Precautions: Fall;Knee Precaution Comments: soft BP Restrictions Weight Bearing Restrictions: Yes RLE Weight Bearing: Weight bearing as tolerated    Mobility  Bed Mobility               General bed mobility comments: pt sitting EOB when i arrived  Transfers Overall transfer level: Needs assistance Equipment used: Rolling walker (2 wheeled) Transfers: Sit to/from [employer] Sit to Stand: Supervision Stand pivot transfers: Supervision       General transfer comment: VCs for hand placement, no assist needed - reviewed sequencing and safety - not pivoting on operated leg  Ambulation/Gait   Gait Distance (Feet): 5 Feet Assistive device: Rolling walker (2 wheeled)       General Gait Details: pt reports pain just got under control.s he did transfer to [facility] and then to recliner.  she didnt want to walk farther right now but will this  afternoon   Stairs             Wheelchair Mobility    Modified Rankin (Stroke Patients Only)       Balance               Standing balance comment: able to static stand without assist, BUE support for ambulation                            Cognition Arousal/Alertness: Awake/alert Behavior During Therapy: WFL for tasks assessed/performed Overall Cognitive Status: Within Functional Limits for tasks assessed                                 General Comments: pt overwhelmed and weepy.  she said the pain has been unbearable earlier today - talked about asking for meds on regular basis - not when hurting a lot      Exercises Total Joint Exercises Ankle Circles/Pumps: AROM;Both;Supine Quad Sets: AROM;Both;10 reps;Supine Hip ABduction/ADduction: AAROM;Right;10 reps;Supine    General Comments        Pertinent Vitals/Pain Pain Assessment: 0-10 Pain Score: 7  Pain Location: right knee Pain Descriptors / Indicators: Burning;Aching;Grimacing;Shooting;Constant;Crushing;Discomfort;Throbbing Pain Intervention(s): Limited activity within patient's tolerance;Monitored during session;Repositioned    Home Living                      Prior Function  PT Goals (current goals can now be found in the care plan section) Progress towards PT goals: Progressing toward goals    Frequency    7X/week      PT Plan Current plan remains appropriate    Co-evaluation              AM-PAC PT "6 Clicks" Mobility   Outcome Measure  Help needed turning from your back to your side while in a flat bed without using bedrails?: A Little Help needed moving from lying on your back to sitting on the side of a flat bed without using bedrails?: A Little Help needed moving to and from a bed to a chair (including a wheelchair)?: A Little Help needed standing up from a chair using your arms (e.g., wheelchair or bedside chair)?: A Little Help  needed to walk in hospital room?: A Little Help needed climbing 3-5 steps with a railing? : A Lot 6 Click Score: 17    End of Session Equipment Utilized During Treatment: Gait belt Activity Tolerance: Patient tolerated treatment well;Patient limited by pain Patient left: in bed;with call bell/phone within reach;with bed alarm set Nurse Communication: Mobility status;Patient requests pain meds PT Visit Diagnosis: Difficulty in walking, not elsewhere classified (R26.2);Muscle weakness (generalized) (M62.81);Pain Pain - Right/Left: Right Pain - part of body: Knee     Time: 1225-1240 PT Time Calculation (min) (ACUTE ONLY): 15 min  Charges:  $Therapeutic Activity: 8-22 mins                     10/01/2019   Rande Lawman, PT    Loyal Buba 10/01/2019, 1:50 PM

## 2019-10-01 NOTE — Plan of Care (Signed)
  Problem: Pain Managment: Goal: General experience of comfort will improve Outcome: Progressing   

## 2019-10-01 NOTE — Progress Notes (Signed)
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 August Saucer 4/8. PMH includes TKA 10 yrs ago with prior revision, L TKA in 2017, multiple back sx, anemia, HTN, and prior n/v and soft BP following surgery.    PT Comments    Pt feeling better - did 5 steps with me and then got weak and nauseated and unable to do anymore walking.  Pt did flexion stretching today with gentle AAROM - couldn't push her as she felt so bad.  Pt did well on steps (with KI on right leg for pt safety for her 11 steps).  Her poor activity tolerance (?BP) will be her limiting factor as far as her being ready to return home.  She is also limited by her intense pain.  Pt will continue to be seen BID until DC home with [facility].   Follow Up Recommendations  Home health PT;Supervision/Assistance - 24 hour     Equipment Recommendations  None recommended by PT    Recommendations for Other Services       Precautions / Restrictions Precautions Precautions: Fall;Knee Precaution Comments: soft BP Required Braces or Orthoses: Knee Immobilizer - Right Knee Immobilizer - Right: (for up and down stairs for pt safety) Restrictions Weight Bearing Restrictions: Yes RLE Weight Bearing: Weight bearing as tolerated    Mobility  Bed Mobility Overal bed mobility: Needs Assistance Bed Mobility: Sit to Supine       Sit to supine: Min assist   General bed mobility comments: pt needed help getting right leg into bed and positioned  Transfers Overall transfer level: Needs assistance Equipment used: Rolling walker (2 wheeled) Transfers: Sit to/from Stand Sit to Stand: Min guard Stand pivot transfers: Supervision       General transfer comment: VCs for hand placement, no assist needed - reviewed  sequencing and safety - not pivoting on operated leg  Ambulation/Gait Ambulation/Gait assistance: Min guard Gait Distance (Feet): 5 Feet Assistive device: Rolling walker (2 wheeled) Gait Pattern/deviations: Step-to pattern;Decreased stride length;Antalgic     General Gait Details: pt did steps first adn then so nauseated - unable to walk   Stairs Stairs: Yes Stairs assistance: Min assist Stair Management: One rail Right(and handheld assist left.  Pt wore KI on right for safety on steps) Number of Stairs: 5 General stair comments: pt went up and down 5 steps with one rail and holding my hand with other rail. she knows technque. she did well - just got weak and tired.  We talked about safety - having cahir at top of steps and how to safely sit on steps if needed to take a break.  KI made her and me feel alot safer - esp since she will have 11 steps at home when [facility]   Wheelchair Mobility    Modified Rankin (Stroke Patients Only)       Balance Overall balance assessment: Needs assistance         Standing balance support: Bilateral upper extremity supported;During functional activity   Standing balance comment: able to static stand without assist, BUE support for ambulation                            Cognition Arousal/Alertness: Awake/alert Behavior During Therapy: [facility] for tasks assessed/performed Overall Cognitive  Status: Within Functional Limits for tasks assessed                                 General Comments: Pt feeling better but low tolerance to activity - got very light headed and dizzy and nauseous during therapy      Exercises Total Joint Exercises Ankle Circles/Pumps: AROM;Both;Supine Quad Sets: AROM;Both;10 reps;Supine Hip ABduction/ADduction: AAROM;Right;10 reps;Supine Other Exercises Other Exercises: pt sitting - worked on letting right leg relax and doing gentle AAROM right leg.  she was able to let leg flex and sit on floor -  apprx 50 degrees.  Didnt push her as she was so nauseated and dizzy    General Comments        Pertinent Vitals/Pain Pain Assessment: 0-10 Pain Score: 7  Pain Location: right knee Pain Descriptors / Indicators: Burning;Aching;Grimacing;Shooting;Constant;Crushing;Discomfort;Throbbing Pain Intervention(s): Limited activity within patient's tolerance;Monitored during session;Ice applied;Repositioned    Home Living                      Prior Function            PT Goals (current goals can now be found in the care plan section) Progress towards PT goals: Progressing toward goals    Frequency    7X/week      PT Plan Current plan remains appropriate    Co-evaluation              AM-PAC PT "6 Clicks" Mobility   Outcome Measure  Help needed turning from your back to your side while in a flat bed without using bedrails?: A Little Help needed moving from lying on your back to sitting on the side of a flat bed without using bedrails?: A Little Help needed moving to and from a bed to a chair (including a wheelchair)?: A Little Help needed standing up from a chair using your arms (e.g., wheelchair or bedside chair)?: A Little Help needed to walk in hospital room?: A Little Help needed climbing 3-5 steps with a railing? : A Little 6 Click Score: 18    End of Session Equipment Utilized During Treatment: Gait belt Activity Tolerance: Patient tolerated treatment well;Patient limited by fatigue;Patient limited by pain Patient left: in bed;with call bell/phone within reach;with bed alarm set Nurse Communication: Mobility status;Patient requests pain meds PT Visit Diagnosis: Difficulty in walking, not elsewhere classified (R26.2);Muscle weakness (generalized) (M62.81);Pain Pain - Right/Left: Right Pain - part of body: Knee     Time: 6073-7106 PT Time Calculation (min) (ACUTE ONLY): 60 min  Charges:  $Gait Training: 23-37 mins $Therapeutic Exercise: 8-22  mins $Therapeutic Activity: 8-22 mins                     10/01/2019   Rande Lawman, PT    Loyal Buba 10/01/2019, 4:55 PM

## 2019-10-02 NOTE — Progress Notes (Signed)
PT TREATMENT  Pt seen for additional session to progress gait training. Pt with improved pain control and activity tolerance. Ambulating 80 feet with a walker at a min guard assist level. Able to progress to step through pattern with cues. Suspect steady progress.    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 August Saucer 4/8. PMH includes TKA 10 yrs ago with prior revision, L TKA in 2017, multiple back sx, anemia, HTN, and prior n/v and soft BP following surgery.  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  Precautions  Precautions Fall;Knee  Precaution Booklet Issued Yes (comment)  Required Braces or Orthoses Knee Immobilizer - Right  Knee Immobilizer - Right Other (comment) (when climbing stairs)  Restrictions  Weight Bearing Restrictions Yes  RLE Weight Bearing WBAT  Pain Assessment  Pain Assessment Faces  Faces Pain Scale 4  Pain Location right knee  Pain Descriptors / Indicators Aching;Grimacing;Constant;Discomfort  Pain Intervention(s) Limited activity within patient's tolerance;Monitored during session;Patient requesting pain meds-RN notified  Cognition  Arousal/Alertness Awake/alert  Behavior During Therapy WFL for tasks assessed/performed  Overall Cognitive Status Within Functional Limits for tasks assessed  Bed Mobility  Overal bed mobility Needs Assistance  Bed Mobility Supine to Sit;Sit to Supine  Supine to sit Min assist  Sit to supine Min assist  General bed mobility comments MinA for RLE negotiation into and out of bed  Transfers  Overall transfer level Needs assistance  Equipment used Rolling walker (2 wheeled)   Transfers Sit to/from Stand  Sit to Stand Min guard  General transfer comment Increased time to rise  Ambulation/Gait  Ambulation/Gait assistance Min guard  Gait Distance (Feet) 80 Feet  Assistive device Rolling walker (2 wheeled)  Gait Pattern/deviations Decreased stride length;Antalgic;Step-through pattern;Decreased stance time - right;Decreased weight shift to right  General Gait Details Cues for progressing to step through pattern, shorter R step length, sequencing.  Gait velocity decreased  Balance  Overall balance assessment Needs assistance  Sitting-balance support No upper extremity supported;Feet supported  Standing balance support Bilateral upper extremity supported;During functional activity  Standing balance-Leahy Scale Fair  Exercises  Exercises General Lower Extremity  Total Joint Exercises  Heel Slides Right;10 reps;AAROM;Supine  Hip ABduction/ADduction Right;10 reps;Supine  PT - End of Session  Equipment Utilized During Treatment Gait belt  Activity Tolerance Patient tolerated treatment well  Patient left in bed;with call bell/phone within reach  Nurse Communication Mobility status  CPM Right Knee  CPM Right Knee Off   PT - Assessment/Plan  PT Plan Current plan remains appropriate  PT Visit Diagnosis Difficulty in walking, not elsewhere classified (R26.2);Muscle weakness (generalized) (M62.81);Pain  Pain - Right/Left Right  Pain - part of body Knee  PT Frequency (ACUTE ONLY) 7X/week  Follow Up Recommendations Home health PT;Supervision/Assistance - 24 hour  PT equipment None recommended by PT  AM-PAC PT "6 Clicks" Mobility Outcome Measure (Version 2)  Help needed turning from your back to your side while in a flat bed without using bedrails? 4  Help needed moving from lying on your back to sitting on the side of a flat bed without using bedrails? 4  Help needed moving to and from a bed  to a chair (including a wheelchair)? 3  Help needed standing up from a  chair using your arms (e.g., wheelchair or bedside chair)? 3  Help needed to walk in hospital room? 3  Help needed climbing 3-5 steps with a railing?  3  6 Click Score 20  Consider Recommendation of Discharge To: Home with no services  PT Goal Progression  Progress towards PT goals Progressing toward goals  Acute Rehab PT Goals  Potential to Achieve Goals Good  PT Time Calculation  PT Start Time (ACUTE ONLY) 1555  PT Stop Time (ACUTE ONLY) 1612  PT Time Calculation (min) (ACUTE ONLY) 17 min  PT General Charges  $$ ACUTE PT VISIT 1 Visit  PT Treatments  $Gait Training 8-22 mins

## 2019-10-02 NOTE — Progress Notes (Signed)
Patient ID: Melissa Jordan, female   DOB: 26-Dec-1945, 74 y.o.   MRN: [id number] The patient is very tearful this morning.  She is sitting in a chair and her vital signs are stable but she is upset with herself in terms of her progression and her mobility.  She did work on some steps yesterday but she reports that she has more steps to get into her house and she is quite concerned about being able to get there today.  I gave her reassurance that she is making appropriate progress based on the extent of her surgery.  Her dressing is clean and dry on her right knee and her calf is soft and she is able to flex and extend her right foot.  Most likely she will need to stay today to maximize her mobility with therapy prior to discharge.  She states that she feels better with that treatment plan as well.

## 2019-10-02 NOTE — Progress Notes (Addendum)
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 August Saucer 4/8. PMH includes TKA 10 yrs ago with prior revision, L TKA in 2017, multiple back sx, anemia, HTN, and prior n/v and soft BP following surgery.    Melissa Jordan Comments    Melissa Jordan with improved activity tolerance today, able to negotiate a total of 10 steps with use of right railing and left handheld assist to simulate home set up. Melissa Jordan did require a seated rest break in between sets due to lightheadedness and pain. Achieving 80 degrees seated knee flexion this session. Pain control/management remains an issue. Will benefit from HHPT follow up to address deficits.    Follow Up Recommendations  Home health Melissa Jordan;Supervision/Assistance - 24 hour     Equipment Recommendations  None recommended by Melissa Jordan    Recommendations for Other Services       Precautions / Restrictions Precautions Precautions: Fall;Knee Precaution Booklet Issued: Yes (comment) Required Braces or Orthoses: Knee Immobilizer - Right Knee Immobilizer - Right: Other (comment)(for stairs) Restrictions Weight Bearing Restrictions: Yes RLE Weight Bearing: Weight bearing as tolerated    Mobility  Bed Mobility               General bed mobility comments: OOB in chair  Transfers Overall transfer level: Needs assistance Equipment used: Rolling walker (2 wheeled) Transfers: Sit to/from Stand Sit to Stand: Min guard         General transfer comment: No physical assist to rise from recliner and toilet  Ambulation/Gait Ambulation/Gait assistance: Min guard Gait Distance (Feet): 15 Feet Assistive device: Rolling walker (2 wheeled) Gait Pattern/deviations: Decreased stride length;Antalgic;Step-through pattern;Decreased stance time -  right;Decreased weight shift to right Gait velocity: decreased   General Gait Details: Slow and steady pace, cues for right foot in neutral positioning   Stairs Stairs: Yes Stairs assistance: Min assist Stair Management: One rail Right(L HHA) Number of Stairs: 10(5x5) General stair comments: Melissa Jordan ascended/descended 5 steps, two times with a seated rest break in between. Requiring light external support via L handheld assist and use of R railing. No cues needed for sequencing, just activity pacing.    Wheelchair Mobility    Modified Rankin (Stroke Patients Only)       Balance Overall balance assessment: Needs assistance Sitting-balance support: No upper extremity supported;Feet supported Sitting balance-Leahy Scale: Good     Standing balance support: Bilateral upper extremity supported;During functional activity Standing balance-Leahy Scale: Fair                              Cognition Arousal/Alertness: Awake/alert Behavior During Therapy: WFL for tasks assessed/performed Overall Cognitive Status: Within Functional Limits for tasks assessed                                        Exercises Total Joint Exercises Heel Slides: Right;Seated;5 reps Long Arc Quad: Right;5 reps;Seated    General Comments        Pertinent Vitals/Pain Pain Assessment: Faces Faces Pain Scale: Hurts even more Pain Location: right knee Pain Descriptors / Indicators: Aching;Grimacing;Constant;Discomfort Pain Intervention(s): Limited activity within patient's tolerance;Monitored during session;Premedicated before session    Home Living  Prior Function            Melissa Jordan Goals (current goals can now be found in the care plan section) Acute Rehab Melissa Jordan Goals Patient 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 Potential to Achieve Goals: Good Progress towards Melissa Jordan goals: Progressing toward goals    Frequency     7X/week      Melissa Jordan Plan Current plan remains appropriate    Co-evaluation              AM-PAC Melissa Jordan "6 Clicks" Mobility   Outcome Measure  Help needed turning from your back to your side while in a flat bed without using bedrails?: None Help needed moving from lying on your back to sitting on the side of a flat bed without using bedrails?: None Help needed moving to and from a bed to a chair (including a wheelchair)?: A Little Help needed standing up from a chair using your arms (e.g., wheelchair or bedside chair)?: A Little Help needed to walk in hospital room?: A Little Help needed climbing 3-5 steps with a railing? : A Little 6 Click Score: 20    End of Session Equipment Utilized During Treatment: Gait belt Activity Tolerance: Patient tolerated treatment well Patient left: in chair;with call bell/phone within reach Nurse Communication: Mobility status Melissa Jordan Visit Diagnosis: Difficulty in walking, not elsewhere classified (R26.2);Muscle weakness (generalized) (M62.81);Pain Pain - Right/Left: Right Pain - part of body: Knee     Time: 0867-6195 Melissa Jordan Time Calculation (min) (ACUTE ONLY): 39 min  Charges:  $Gait Training: 23-37 mins $Therapeutic Activity: 8-22 mins                       Wyona Almas, Melissa Jordan, DPT Acute Rehabilitation Services Pager [phone] Office [phone]    Deno Etienne 10/02/2019, 3:45 PM

## 2019-10-03 ENCOUNTER — Encounter: Payer: Self-pay | Admitting: *Deleted

## 2019-10-03 ENCOUNTER — Ambulatory Visit: Payer: Medicare Other | Admitting: Family Medicine

## 2019-10-03 NOTE — Progress Notes (Signed)
Patient discharged home by private vehicle.  All personal belongings gathered and taken home with patient.  Patient was 2/10 on pain scale when left, had all discharge instructions, verbalized understanding, had no questions upon discharge.

## 2019-10-03 NOTE — Progress Notes (Signed)
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 Marlou Sa 4/8. PMH includes TKA 10 yrs ago with prior revision, L TKA in 2017, multiple back sx, anemia, HTN, and prior n/v and soft BP following surgery.   PT Comments    Pt progressing well with mobility. Today's session focused on gait/transfer training and tolerance performing standing ADL tasks. Will plan to complete stair training during later AM session per pt request after wanting pain medicine. After this, pt reports feeling more confident to return home today with husband's assist; owns necessary DME.    Follow Up Recommendations  Home health PT;Supervision for mobility/OOB     Equipment Recommendations  None recommended by PT    Recommendations for Other Services       Precautions / Restrictions Precautions Precautions: Fall;Knee Restrictions Weight Bearing Restrictions: Yes RLE Weight Bearing: Weight bearing as tolerated    Mobility  Bed Mobility Overal bed mobility: Modified Independent             General bed mobility comments: Received sitting EOB  Transfers Overall transfer level: Needs assistance Equipment used: Rolling walker (2 wheeled) Transfers: Sit to/from Stand Sit to Stand: Supervision         General transfer comment: Performed multiple sit<>stands from bed, recliner and BSC (over toilet) to RW at supervision-level; good technique, no cues requred  Ambulation/Gait Ambulation/Gait assistance: Supervision Gait Distance (Feet): 100 Feet Assistive device: Rolling walker (2 wheeled) Gait Pattern/deviations: Step-through pattern;Decreased stride length;Trunk flexed;Antalgic Gait velocity: Decreased Gait velocity interpretation: <1.8 ft/sec, indicate of risk for  recurrent falls General Gait Details: Pt with good step-through pattern, although still antalgic. Very slow gait speed, unable to increase despite cues   Stairs Stairs: (declined this AM due to pain, planning for later AM session after medicated)           Wheelchair Mobility    Modified Rankin (Stroke Patients Only)       Balance Overall balance assessment: Needs assistance Sitting-balance support: No upper extremity supported;Feet supported Sitting balance-Leahy Scale: Good       Standing balance-Leahy Scale: Fair Standing balance comment: Can static stand to perform ADL tasks at sink (brush hair, brush teeth, wash face) without UE support; supervision-level                            Cognition Arousal/Alertness: Awake/alert Behavior During Therapy: WFL for tasks assessed/performed Overall Cognitive Status: Within Functional Limits for tasks assessed                                        Exercises      General Comments General comments (skin integrity, edema, etc.): Reviewed precautions, resting position, sleeping position, use of blue bone foam (resting in extension), therex/ROM      Pertinent Vitals/Pain Pain Assessment: Faces Faces Pain Scale: Hurts even more Pain Location: right knee Pain Descriptors / Indicators: Aching;Grimacing;Constant;Discomfort Pain Intervention(s): Patient requesting pain meds-RN notified;Limited activity within patient's tolerance    Home Living                      Prior Function  PT Goals (current goals can now be found in the care plan section) Progress towards PT goals: Progressing toward goals    Frequency    7X/week      PT Plan Current plan remains appropriate    Co-evaluation              AM-PAC PT "6 Clicks" Mobility   Outcome Measure  Help needed turning from your back to your side while in a flat bed without using bedrails?: None Help needed moving  from lying on your back to sitting on the side of a flat bed without using bedrails?: None Help needed moving to and from a bed to a chair (including a wheelchair)?: None Help needed standing up from a chair using your arms (e.g., wheelchair or bedside chair)?: None Help needed to walk in hospital room?: None Help needed climbing 3-5 steps with a railing? : A Little 6 Click Score: 23    End of Session Equipment Utilized During Treatment: Gait belt Activity Tolerance: Patient tolerated treatment well Patient left: in chair;with call bell/phone within reach;with chair alarm set Nurse Communication: Mobility status PT Visit Diagnosis: Difficulty in walking, not elsewhere classified (R26.2);Muscle weakness (generalized) (M62.81);Pain Pain - Right/Left: Right Pain - part of body: Knee     Time: 0981-1914 PT Time Calculation (min) (ACUTE ONLY): 25 min  Charges:  $Gait Training: 8-22 mins $Therapeutic Activity: 8-22 mins                    Ina Homes, PT, DPT Acute Rehabilitation Services  Pager [phone] Office [phone]  Malachy Chamber 10/03/2019, 9:12 AM

## 2019-10-03 NOTE — Care Management Important Message (Signed)
Important Message  Patient Details  Name: Melissa Jordan MRN: [id number] Date of Birth: 02/10/1946   Medicare Important Message Given:  Yes     [name] Stefan Church 10/03/2019, 4:10 PM

## 2019-10-03 NOTE — Progress Notes (Signed)
Physical Therapy Treatment Patient Details Name: Melissa Jordan MRN: [id number] DOB: [birth year] Today's Date: 10/03/2019    History of Present Illness Pt is a 74 yo female presenting s/p right removal unicompartmental knee arthroplasty, conversion to total knee arthroplasty by Dr. August Saucer 4/8. PMH includes TKA 10 yrs ago with prior revision, L TKA in 2017, multiple back sx, anemia, HTN, and prior n/v and soft BP following surgery.   PT Comments    Pt seen for additional session for gait training. This went even better than days prior, pt able to ascend/descend multiple steps with single rail support and min guard. Educ on guarding technique for husband, also simulated car transfer. Pt initially requesting third session before d/c home this afternoon, but do not feel pt needs this as she is moving well and has necessary family support; pt in agreement. RN notified pt cleared from PT for d/c home today.    Follow Up Recommendations  Home health PT;Supervision for mobility/OOB     Equipment Recommendations  None recommended by PT    Recommendations for Other Services       Precautions / Restrictions Precautions Precautions: Fall;Knee Restrictions Weight Bearing Restrictions: Yes RLE Weight Bearing: Weight bearing as tolerated    Mobility  Bed Mobility Overal bed mobility: Modified Independent             General bed mobility comments: Received sitting in recliner  Transfers Overall transfer level: Needs assistance Equipment used: Rolling walker (2 wheeled) Transfers: Sit to/from Stand Sit to Stand: Supervision         General transfer comment: Good technique performing multiple sit<>stands from recliner to RW  Ambulation/Gait Ambulation/Gait assistance: Supervision Gait Distance (Feet): 40 Feet Assistive device: Rolling walker (2 wheeled) Gait Pattern/deviations: Step-through pattern;Decreased stride length;Trunk flexed;Antalgic Gait velocity: Decreased Gait  velocity interpretation: <1.8 ft/sec, indicate of risk for recurrent falls General Gait Details: Pt with good step-through pattern, although still antalgic. Very slow gait speed, unable to increase despite cues   Stairs Stairs: Yes Stairs assistance: Min guard Stair Management: One rail Right;Step to pattern;Forwards Number of Stairs: 10 General stair comments: Pt ascended/descended 5 steps 2x with standing rest break in between; single UE support on R-side rail, intermittent HHA for LUE pending pt's comfort. Educ on technique husband can guard; gait belt provided for them to use for added confidence/safety   Wheelchair Mobility    Modified Rankin (Stroke Patients Only)       Balance Overall balance assessment: Needs assistance Sitting-balance support: No upper extremity supported;Feet supported Sitting balance-Leahy Scale: Good     Standing balance support: Bilateral upper extremity supported;During functional activity Standing balance-Leahy Scale: Fair Standing balance comment: Can static stand to perform ADL tasks at sink (brush hair, brush teeth, wash face) without UE support; supervision-level                            Cognition Arousal/Alertness: Awake/alert Behavior During Therapy: WFL for tasks assessed/performed Overall Cognitive Status: Within Functional Limits for tasks assessed                                        Exercises      General Comments General comments (skin integrity, edema, etc.): Pt initially requesting 3rd session today for continued stair training - educ on current status and do not feel pt needs this as  she is moving well, will have necessary family assist. Demonstrated simulated car transfer on mat table in gym      Pertinent Vitals/Pain Pain Assessment: Faces Faces Pain Scale: Hurts little more Pain Location: right knee Pain Descriptors / Indicators: Aching;Grimacing;Constant;Discomfort Pain Intervention(s):  Premedicated before session    Home Living                      Prior Function            PT Goals (current goals can now be found in the care plan section) Progress towards PT goals: Progressing toward goals    Frequency    7X/week      PT Plan Current plan remains appropriate    Co-evaluation              AM-PAC PT "6 Clicks" Mobility   Outcome Measure  Help needed turning from your back to your side while in a flat bed without using bedrails?: None Help needed moving from lying on your back to sitting on the side of a flat bed without using bedrails?: None Help needed moving to and from a bed to a chair (including a wheelchair)?: None Help needed standing up from a chair using your arms (e.g., wheelchair or bedside chair)?: None Help needed to walk in hospital room?: None Help needed climbing 3-5 steps with a railing? : A Little 6 Click Score: 23    End of Session Equipment Utilized During Treatment: Gait belt Activity Tolerance: Patient tolerated treatment well Patient left: in chair;with call bell/phone within reach Nurse Communication: Mobility status PT Visit Diagnosis: Difficulty in walking, not elsewhere classified (R26.2);Muscle weakness (generalized) (M62.81);Pain Pain - Right/Left: Right Pain - part of body: Knee     Time: 1022-1040 PT Time Calculation (min) (ACUTE ONLY): 18 min  Charges:  $Gait Training: 8-22 mins                     Mabeline Caras, PT, DPT Acute Rehabilitation Services  Pager [phone] Office [place] 10/03/2019, 10:48 AM

## 2019-10-03 NOTE — Progress Notes (Signed)
  Subjective: Melissa Jordan is a 74 y.o. female s/p right unicompartmental knee arthroplasty conversion to TKA.  They are POD4.  Pt's pain is controlled and continues to improve.  Pt has ambulated with little difficulty.     Objective: Vital signs in last 24 hours: Temp:  [98.3 F (36.8 C)-98.8 F (37.1 C)] 98.3 F (36.8 C) (04/12 0748) Pulse Rate:  [67-77] 67 (04/12 0748) Resp:  [17-18] 17 (04/12 0748) BP: (110-134)/(54-65) 121/65 (04/12 0748) SpO2:  [95 %-100 %] 95 % (04/12 0748)  Intake/Output from previous day: 04/11 0701 - 04/12 0700 In: 480 [P.O.:480] Out: -  Intake/Output this shift: Total I/O In: 240 [P.O.:240] Out: -   Exam:  No gross blood or drainage overlying the dressing 2+ DP pulse Sensation intact distally in the right foot Able to dorsiflex and plantarflex the right foot   Labs: No results for input(s): HGB in the last 72 hours. No results for input(s): WBC, RBC, HCT, PLT in the last 72 hours. No results for input(s): NA, K, CL, CO2, BUN, CREATININE, GLUCOSE, CALCIUM in the last 72 hours. No results for input(s): LABPT, INR in the last 72 hours.  Assessment/Plan: Pt is POD4 s/p right TKA.    -Plan to discharge to home today.  Patient continues to improve her mobility and has progressed well in PT  -WBAT with a walker  -F/u with Dr. August Saucer ~2 weeks out from procedure     [employer] 10/03/2019, 1:56 PM

## 2019-10-04 ENCOUNTER — Telehealth: Payer: Self-pay | Admitting: Family Medicine

## 2019-10-04 DIAGNOSIS — T8484XA Pain due to internal orthopedic prosthetic devices, implants and grafts, initial encounter: Secondary | ICD-10-CM | POA: Diagnosis not present

## 2019-10-04 DIAGNOSIS — Z96651 Presence of right artificial knee joint: Secondary | ICD-10-CM | POA: Diagnosis not present

## 2019-10-04 DIAGNOSIS — M1711 Unilateral primary osteoarthritis, right knee: Secondary | ICD-10-CM | POA: Diagnosis not present

## 2019-10-04 LAB — AEROBIC/ANAEROBIC CULTURE W GRAM STAIN (SURGICAL/DEEP WOUND): Culture: NO GROWTH

## 2019-10-04 NOTE — Progress Notes (Signed)
  Chronic Care Management   Outreach Note  10/04/2019 Name: JANNIE DOYLE MRN: [id number] DOB: 1946-02-20  Referred by: Sheliah Hatch, MD Reason for referral : No chief complaint on file.   An unsuccessful telephone outreach was attempted today. The patient was referred to the pharmacist for assistance with care management and care coordination.   Follow Up Plan:   Lynnae January [employer] Scheduler

## 2019-10-05 DIAGNOSIS — M8580 Other specified disorders of bone density and structure, unspecified site: Secondary | ICD-10-CM | POA: Diagnosis not present

## 2019-10-05 DIAGNOSIS — K5900 Constipation, unspecified: Secondary | ICD-10-CM | POA: Diagnosis not present

## 2019-10-05 DIAGNOSIS — K219 Gastro-esophageal reflux disease without esophagitis: Secondary | ICD-10-CM | POA: Diagnosis not present

## 2019-10-05 DIAGNOSIS — Z471 Aftercare following joint replacement surgery: Secondary | ICD-10-CM | POA: Diagnosis not present

## 2019-10-05 DIAGNOSIS — E039 Hypothyroidism, unspecified: Secondary | ICD-10-CM | POA: Diagnosis not present

## 2019-10-05 DIAGNOSIS — Z981 Arthrodesis status: Secondary | ICD-10-CM | POA: Diagnosis not present

## 2019-10-05 DIAGNOSIS — I480 Paroxysmal atrial fibrillation: Secondary | ICD-10-CM | POA: Diagnosis not present

## 2019-10-05 DIAGNOSIS — Z7901 Long term (current) use of anticoagulants: Secondary | ICD-10-CM | POA: Diagnosis not present

## 2019-10-05 DIAGNOSIS — M47819 Spondylosis without myelopathy or radiculopathy, site unspecified: Secondary | ICD-10-CM | POA: Diagnosis not present

## 2019-10-05 DIAGNOSIS — I10 Essential (primary) hypertension: Secondary | ICD-10-CM | POA: Diagnosis not present

## 2019-10-05 DIAGNOSIS — Z96653 Presence of artificial knee joint, bilateral: Secondary | ICD-10-CM | POA: Diagnosis not present

## 2019-10-05 DIAGNOSIS — J45909 Unspecified asthma, uncomplicated: Secondary | ICD-10-CM | POA: Diagnosis not present

## 2019-10-05 DIAGNOSIS — G2581 Restless legs syndrome: Secondary | ICD-10-CM | POA: Diagnosis not present

## 2019-10-05 DIAGNOSIS — E785 Hyperlipidemia, unspecified: Secondary | ICD-10-CM | POA: Diagnosis not present

## 2019-10-05 DIAGNOSIS — R3915 Urgency of urination: Secondary | ICD-10-CM | POA: Diagnosis not present

## 2019-10-05 DIAGNOSIS — M5440 Lumbago with sciatica, unspecified side: Secondary | ICD-10-CM | POA: Diagnosis not present

## 2019-10-05 NOTE — Discharge Summary (Signed)
Physician Discharge Summary      Patient ID: Melissa Jordan MRN: [id number] DOB/AGE: 1946/05/26 74 y.o.  Admit date: 09/29/2019 Discharge date: 10/03/2019  Admission Diagnoses:  Active Problems:   S/P total knee arthroplasty, right   Discharge Diagnoses:  Same  Surgeries: Procedure(s): right removal unicompartmental knee arthroplasty, conversion to total knee arthroplasty on 09/29/2019   Consultants:   Discharged Condition: Stable  Hospital Course: Melissa Jordan is an 74 y.o. 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  They were brought to the operating room on 09/29/2019 and underwent the above named procedures.  Pt awoke from anesthesia without complication and was transferred to the floor. On POD1, patient's pain was well-controlled but she did experience an episode of chest pain with SOB and palpitations that morning.  The episode quickly resolved. She has a history of A-fib.  Cardiologist consult placed who recommended no further management other than continuing Eliquis for stroke prophylaxis and continuing metoprolol for rate control.  She stayed in the hospital over the weekend to work on improving her mobility with PT until she was confident in her ability to ambulate around her home. She was satisfied with her progress and subsequently discharged home on POD4.  Pt will f/u with Dr. August Saucer in clinic in ~2 weeks.   Antibiotics given:  Anti-infectives (From admission, onward)   Start     Dose/Rate Route Frequency Ordered Stop   09/29/19 2200  vancomycin (VANCOCIN) IVPB 1000 mg/200 mL premix     1,000 mg 200 mL/hr over 60 Minutes Intravenous Every 12 hours 09/29/19 2152 09/30/19 0022   09/29/19 1814  vancomycin (VANCOCIN) powder  Status:  Discontinued       As needed 09/29/19 1814 09/29/19 1911   09/29/19 1200  vancomycin (VANCOCIN) IVPB 1000 mg/200 mL premix     1,000 mg 200 mL/hr  over 60 Minutes Intravenous On call to O.R. 09/29/19 1153 09/29/19 1351    .  Recent vital signs:  Vitals:   10/03/19 0748 10/03/19 1538  BP: 121/65 119/73  Pulse: 67 79  Resp: 17 18  Temp: 98.3 F (36.8 C) 98.6 F (37 C)  SpO2: 95% 100%    Recent laboratory studies:  Results for orders placed or performed during the hospital encounter of 09/29/19  Aerobic/Anaerobic Culture (surgical/deep wound)   Specimen: Synovial, Right Knee; Body Fluid  Result Value Ref Range   Specimen Description SYNOVIAL RIGHT KNEE    Special Requests FLUID ON SWAB    Gram Stain      FEW WBC PRESENT, PREDOMINANTLY PMN NO ORGANISMS SEEN    Culture      No growth aerobically or anaerobically. Performed at [facility], [address], [place], [place] [address]    Report Status 10/04/2019 FINAL   Protime-INR  Result Value Ref Range   Prothrombin Time 13.2 11.4 - 15.2 seconds   INR 1.0 0.8 - 1.2  CBC with Differential/Platelet  Result Value Ref Range   WBC 13.1 (H) 4.0 - 10.5 K/uL   RBC 3.60 (L) 3.87 - 5.11 MIL/uL   Hemoglobin 10.6 (L) 12.0 - 15.0 g/dL   HCT 70.6 (L) 23.7 - 62.8 %   MCV 88.6 80.0 - 100.0 fL   MCH 29.4 26.0 - 34.0 pg   MCHC 33.2 30.0 - 36.0 g/dL   RDW 31.5 17.6 - 16.0 %   Platelets 229 150 -  400 K/uL   nRBC 0.0 0.0 - 0.2 %   Neutrophils Relative % 83 %   Neutro Abs 11.0 (H) 1.7 - 7.7 K/uL   Lymphocytes Relative 6 %   Lymphs Abs 0.8 0.7 - 4.0 K/uL   Monocytes Relative 10 %   Monocytes Absolute 1.3 (H) 0.1 - 1.0 K/uL   Eosinophils Relative 0 %   Eosinophils Absolute 0.0 0.0 - 0.5 K/uL   Basophils Relative 0 %   Basophils Absolute 0.0 0.0 - 0.1 K/uL   Immature Granulocytes 1 %   Abs Immature Granulocytes 0.07 0.00 - 0.07 K/uL  Basic metabolic panel  Result Value Ref Range   Sodium 138 135 - 145 mmol/L   Potassium 3.8 3.5 - 5.1 mmol/L   Chloride 104 98 - 111 mmol/L   CO2 24 22 - 32 mmol/L   Glucose, Bld 126 (H) 70 - 99 mg/dL   BUN 12 8 - 23 mg/dL   Creatinine,  Ser 1.54 0.44 - 1.00 mg/dL   Calcium 8.3 (L) 8.9 - 10.3 mg/dL   GFR calc non Af Amer >60 >60 mL/min   GFR calc Af Amer >60 >60 mL/min   Anion gap 10 5 - 15  Magnesium  Result Value Ref Range   Magnesium 1.7 1.7 - 2.4 mg/dL  Troponin I (High Sensitivity)  Result Value Ref Range   Troponin I (High Sensitivity) 4 <18 ng/L    Discharge Medications:   Allergies as of 10/03/2019      Reactions   Lyrica [pregabalin] Palpitations, Other (See Comments)   "thought I was going to have a heart attack; heart started pounding so hard, I couldn't catch my breath" (06/28/2012)   Meclizine Other (See Comments)   "what was in my mind to say wasn't what came out to say; I was kind of in another world" (06/28/2012)   Penicillins Itching, Rash   Has patient had a PCN reaction causing immediate rash, facial/tongue/throat swelling, SOB or lightheadedness with hypotension: No Has patient had a PCN reaction causing severe rash involving mucus membranes or skin necrosis: No Has patient had a PCN reaction that required hospitalization No Has patient had a PCN reaction occurring within the last 10 years:   # # YES # #  If all of the above answers are "NO", then may proceed with Cephalosporin use.   Poison Sumac Extract Swelling, Rash, Other (See Comments)   Blisters   Bactrim [sulfamethoxazole-trimethoprim] Rash   Codeine Nausea Only   Morphine Sulfate Rash   Propoxyphene N-acetaminophen Nausea And Vomiting   "Darvocet"      Medication List    STOP taking these medications   HYDROcodone-acetaminophen 5-325 MG tablet Commonly known as: NORCO/VICODIN   hydrOXYzine 25 MG tablet Commonly known as: ATARAX/VISTARIL   traMADol 50 MG tablet Commonly known as: ULTRAM     TAKE these medications   acetaminophen 500 MG tablet Commonly known as: TYLENOL Take 1,000 mg by mouth every 6 (six) hours as needed for moderate pain.   apixaban 5 MG Tabs tablet Commonly known as: Eliquis Take 1 tablet (5 mg total) by  mouth 2 (two) times daily.   Biotin 5000 MCG Caps Take 5,000 mcg by mouth daily.   CALCIUM-VITAMIN D PO Take 1 tablet by mouth daily.   cholecalciferol 1000 units tablet Commonly known as: VITAMIN D Take 1,000 Units by mouth daily.   docusate sodium 100 MG capsule Commonly known as: COLACE Take 1 capsule (100 mg total) by mouth 2 (  two) times daily.   estradiol 1 MG tablet Commonly known as: ESTRACE Take 1 tablet by mouth once daily   fluticasone 50 MCG/ACT nasal spray Commonly known as: FLONASE Place 2 sprays into both nostrils daily.   gabapentin 300 MG capsule Commonly known as: NEURONTIN Take 1 capsule (300 mg total) by mouth 3 (three) times daily.   levothyroxine 75 MCG tablet Commonly known as: SYNTHROID TAKE 1 TABLET BY MOUTH  DAILY What changed: when to take this   losartan 50 MG tablet Commonly known as: COZAAR Take 50 mg by mouth daily. Notes to patient: **Do not take if your blood pressure is low or if you feel dizzy or faint**   magnesium oxide 400 MG tablet Commonly known as: MAG-OX Take 400 mg by mouth daily.   menthol-cetylpyridinium 3 MG lozenge Commonly known as: CEPACOL Take 1 lozenge (3 mg total) by mouth as needed for sore throat (sore throat).   metoprolol tartrate 50 MG tablet Commonly known as: LOPRESSOR Take 25 mg by mouth 2 (two) times daily.   oxyCODONE 5 MG immediate release tablet Commonly known as: Oxy IR/ROXICODONE Take 1-2 tablets (5-10 mg total) by mouth every 4 (four) hours as needed for moderate pain (pain score 4-6).   rOPINIRole 1 MG tablet Commonly known as: Requip Take 1 tablet (1 mg total) by mouth at bedtime.   tiZANidine 2 MG tablet Commonly known as: ZANAFLEX Take 1 tablet (2 mg total) by mouth every 8 (eight) hours as needed for muscle spasms.       Diagnostic Studies: No results found.  Disposition: Discharge disposition: 01-Home or Self Care       Discharge Instructions    Call MD / Call 911    Complete by: As directed    If you experience chest pain or shortness of breath, CALL 911 and be transported to the hospital emergency room.  If you develope a fever above 101 F, pus (white drainage) or increased drainage or redness at the wound, or calf pain, call your surgeon's office.   Call MD / Call 911   Complete by: As directed    If you experience chest pain or shortness of breath, CALL 911 and be transported to the hospital emergency room.  If you develope a fever above 101 F, pus (white drainage) or increased drainage or redness at the wound, or calf pain, call your surgeon's office.   Constipation Prevention   Complete by: As directed    Drink plenty of fluids.  Prune juice may be helpful.  You may use a stool softener, such as Colace (over the counter) 100 mg twice a day.  Use MiraLax (over the counter) for constipation as needed.   Constipation Prevention   Complete by: As directed    Drink plenty of fluids.  Prune juice may be helpful.  You may use a stool softener, such as Colace (over the counter) 100 mg twice a day.  Use MiraLax (over the counter) for constipation as needed.   Diet - low sodium heart healthy   Complete by: As directed    Diet - low sodium heart healthy   Complete by: As directed    Discharge instructions   Complete by: As directed    Weightbearing as tolerated with crutches or walker CPM machine 1 hour 3 times a day.  Increase degrees daily Remove dressing on right knee 1 week from today Okay to shower with dressing on.  The dressing is waterproof Ice down the  knee after CPM machine for 20 minutes   Discharge instructions   Complete by: As directed    You may shower, dressing is waterproof.  Do not remove the dressing, we will remove it at your first post-op appointment.  Do not take a bath or soak the knee in a tub or pool.  You may weightbear as you can tolerate on the operative leg with a walker.  Continue using the CPM machine 3 times per day for one  hour each time, increasing the degrees of range of motion daily.  Use the blue cradle boot under your heel to work on getting your leg straight.  Do NOT put a pillow under your knee.  You will follow-up with Dr. Marlou Sa in the clinic in 1-2 weeks at your given appointment date.  Work on getting your leg straight especially over the next several weeks.   Increase activity slowly as tolerated   Complete by: As directed    Increase activity slowly as tolerated   Complete by: As directed       Follow-up Information    Midge Minium, MD. Call in 1 week(s).   Specialty: Family Medicine Why: Please followup with your primary care within 10 days of your discharge from the hospital for any medical needs.  Contact information: [address] [place] [address] [phone]        [facility], [facility] Follow up.   Specialty: Home Health Services Why: [facility] will be providing your physical therapy at home.  You should receive a call within 24-48 hours of discharge to home. Contact information: [address] [place] [address] [phone]        Lonn Georgia, PA-C Follow up on 10/14/2019.   Specialties: Cardiology, Radiology Why: Please arreive 15 minutes early for your 3:45pm post-hospital cardiology follow-up appointment. Suanne Marker is a Librarian, academic who works closely with Dr. Gardiner Rhyme.  Contact information: [address] [place] [address] [phone]            Signed: Donella Stade 10/05/2019, 9:46 PM

## 2019-10-06 ENCOUNTER — Other Ambulatory Visit: Payer: Self-pay | Admitting: Surgical

## 2019-10-06 ENCOUNTER — Telehealth: Payer: Self-pay | Admitting: Orthopedic Surgery

## 2019-10-06 MED ORDER — OXYCODONE HCL 5 MG PO TABS: 5.0000 mg | ORAL_TABLET | Freq: Four times a day (QID) | ORAL | 0 refills | Status: DC | PRN

## 2019-10-06 NOTE — Telephone Encounter (Signed)
submitted

## 2019-10-06 NOTE — Telephone Encounter (Signed)
Patient's daughter called requesting an RX refill on the patient's Oxycodone.  She will not have enough pain medication to get her through the weekend.  Patient uses [facility] on [place].  CB#[phone].  Thank you.

## 2019-10-06 NOTE — Telephone Encounter (Signed)
IC advised.  

## 2019-10-06 NOTE — Telephone Encounter (Signed)
Pls advise. Thanks.  

## 2019-10-07 ENCOUNTER — Telehealth: Payer: Self-pay

## 2019-10-07 DIAGNOSIS — Z96653 Presence of artificial knee joint, bilateral: Secondary | ICD-10-CM | POA: Diagnosis not present

## 2019-10-07 DIAGNOSIS — M47819 Spondylosis without myelopathy or radiculopathy, site unspecified: Secondary | ICD-10-CM | POA: Diagnosis not present

## 2019-10-07 DIAGNOSIS — G2581 Restless legs syndrome: Secondary | ICD-10-CM | POA: Diagnosis not present

## 2019-10-07 DIAGNOSIS — Z7901 Long term (current) use of anticoagulants: Secondary | ICD-10-CM | POA: Diagnosis not present

## 2019-10-07 DIAGNOSIS — E039 Hypothyroidism, unspecified: Secondary | ICD-10-CM | POA: Diagnosis not present

## 2019-10-07 DIAGNOSIS — I10 Essential (primary) hypertension: Secondary | ICD-10-CM | POA: Diagnosis not present

## 2019-10-07 DIAGNOSIS — R3915 Urgency of urination: Secondary | ICD-10-CM | POA: Diagnosis not present

## 2019-10-07 DIAGNOSIS — Z471 Aftercare following joint replacement surgery: Secondary | ICD-10-CM | POA: Diagnosis not present

## 2019-10-07 DIAGNOSIS — M5440 Lumbago with sciatica, unspecified side: Secondary | ICD-10-CM | POA: Diagnosis not present

## 2019-10-07 DIAGNOSIS — I480 Paroxysmal atrial fibrillation: Secondary | ICD-10-CM | POA: Diagnosis not present

## 2019-10-07 DIAGNOSIS — J45909 Unspecified asthma, uncomplicated: Secondary | ICD-10-CM | POA: Diagnosis not present

## 2019-10-07 DIAGNOSIS — K5900 Constipation, unspecified: Secondary | ICD-10-CM | POA: Diagnosis not present

## 2019-10-07 DIAGNOSIS — Z981 Arthrodesis status: Secondary | ICD-10-CM | POA: Diagnosis not present

## 2019-10-07 DIAGNOSIS — K219 Gastro-esophageal reflux disease without esophagitis: Secondary | ICD-10-CM | POA: Diagnosis not present

## 2019-10-07 DIAGNOSIS — E785 Hyperlipidemia, unspecified: Secondary | ICD-10-CM | POA: Diagnosis not present

## 2019-10-07 DIAGNOSIS — M8580 Other specified disorders of bone density and structure, unspecified site: Secondary | ICD-10-CM | POA: Diagnosis not present

## 2019-10-07 NOTE — Telephone Encounter (Signed)
Melissa Jordan with [facility] called to [place] know patient was seen yesterday for HHPT and her BP was 80/50 and patient reported that she felt very fatigued. Also stated that patients urine appeared very dark in color. Therapist advised to increase fluid intake.  Patient is on BP meds and taking the oxycodone.  Contact for patient [phone]

## 2019-10-07 NOTE — Telephone Encounter (Signed)
I'll call her today

## 2019-10-08 ENCOUNTER — Other Ambulatory Visit: Payer: Self-pay

## 2019-10-08 ENCOUNTER — Encounter (HOSPITAL_COMMUNITY): Payer: Self-pay | Admitting: Emergency Medicine

## 2019-10-08 ENCOUNTER — Emergency Department (HOSPITAL_COMMUNITY)
Admission: EM | Admit: 2019-10-08 | Discharge: 2019-10-08 | Disposition: A | Discharge status: Home / Self Care | Payer: Medicare Other | Attending: Emergency Medicine | Admitting: Emergency Medicine

## 2019-10-08 DIAGNOSIS — Z79899 Other long term (current) drug therapy: Secondary | ICD-10-CM | POA: Insufficient documentation

## 2019-10-08 DIAGNOSIS — R531 Weakness: Secondary | ICD-10-CM | POA: Diagnosis not present

## 2019-10-08 DIAGNOSIS — I959 Hypotension, unspecified: Secondary | ICD-10-CM | POA: Diagnosis not present

## 2019-10-08 DIAGNOSIS — J45909 Unspecified asthma, uncomplicated: Secondary | ICD-10-CM | POA: Diagnosis not present

## 2019-10-08 DIAGNOSIS — Z7901 Long term (current) use of anticoagulants: Secondary | ICD-10-CM | POA: Insufficient documentation

## 2019-10-08 DIAGNOSIS — I10 Essential (primary) hypertension: Secondary | ICD-10-CM | POA: Insufficient documentation

## 2019-10-08 DIAGNOSIS — E039 Hypothyroidism, unspecified: Secondary | ICD-10-CM | POA: Insufficient documentation

## 2019-10-08 DIAGNOSIS — Z96653 Presence of artificial knee joint, bilateral: Secondary | ICD-10-CM | POA: Insufficient documentation

## 2019-10-08 LAB — BASIC METABOLIC PANEL WITH GFR
Anion gap: 8 (ref 5–15)
BUN: 15 mg/dL (ref 8–23)
CO2: 27 mmol/L (ref 22–32)
Calcium: 8.7 mg/dL — ABNORMAL LOW (ref 8.9–10.3)
Chloride: 103 mmol/L (ref 98–111)
Creatinine, Ser: 0.99 mg/dL (ref 0.44–1.00)
GFR calc Af Amer: >60 mL/min
GFR calc non Af Amer: 57 mL/min — ABNORMAL LOW
Glucose, Bld: 98 mg/dL (ref 70–99)
Potassium: 4.1 mmol/L (ref 3.5–5.1)
Sodium: 138 mmol/L (ref 135–145)

## 2019-10-08 LAB — URINALYSIS, ROUTINE W REFLEX MICROSCOPIC
Bilirubin Urine: NEGATIVE
Glucose, UA: NEGATIVE mg/dL
Hgb urine dipstick: NEGATIVE
Ketones, ur: NEGATIVE mg/dL
Leukocytes,Ua: NEGATIVE
Nitrite: NEGATIVE
Protein, ur: NEGATIVE mg/dL
Specific Gravity, Urine: 1.008 (ref 1.005–1.030)
pH: 7 (ref 5.0–8.0)

## 2019-10-08 LAB — CBC
HCT: 31.7 % — ABNORMAL LOW (ref 36.0–46.0)
Hemoglobin: 9.9 g/dL — ABNORMAL LOW (ref 12.0–15.0)
MCH: 29.5 pg (ref 26.0–34.0)
MCHC: 31.2 g/dL (ref 30.0–36.0)
MCV: 94.3 fL (ref 80.0–100.0)
Platelets: 325 K/uL (ref 150–400)
RBC: 3.36 MIL/uL — ABNORMAL LOW (ref 3.87–5.11)
RDW: 14.5 % (ref 11.5–15.5)
WBC: 10.8 K/uL — ABNORMAL HIGH (ref 4.0–10.5)
nRBC: 0 % (ref 0.0–0.2)

## 2019-10-08 LAB — CBG MONITORING, ED: Glucose-Capillary: 67 mg/dL — ABNORMAL LOW (ref 70–99)

## 2019-10-08 MED ORDER — SODIUM CHLORIDE 0.9% FLUSH: 3.0000 mL | Freq: Once | INTRAVENOUS | Status: DC

## 2019-10-08 MED ORDER — SODIUM CHLORIDE 0.9 % IV BOLUS
1000.0000 mL | Freq: Once | INTRAVENOUS | Status: AC
  Administered 2019-10-08: 1000 mL via INTRAVENOUS

## 2019-10-08 MED ORDER — OXYCODONE HCL 5 MG PO TABS
5.0000 mg | ORAL_TABLET | Freq: Once | ORAL | Status: AC
  Administered 2019-10-08: 5 mg via ORAL
  Filled 2019-10-08: qty 1
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
## 2019-10-08 NOTE — ED Notes (Signed)
Got patient on the monitor patient is resting with call bell in reach  ?

## 2019-10-08 NOTE — ED Notes (Signed)
Walked patient to the bathroom patient did well patient is now back in bed on the monitor with family at bedside and call bell in reach  ?

## 2019-10-08 NOTE — ED Triage Notes (Signed)
Reports generalized weakness at home this morning.  States BP 72/40 at home.  Pt had R knee surgery on 4/8 and states BP dropped in the low 80s after surgery.

## 2019-10-08 NOTE — Discharge Instructions (Signed)
Do not take your losartan or Cozaar until approved by your primary care physician.  Cut your metoprolol dose into half until you talk to your doctor on Monday.  If you develop fever, infection type symptoms, vomiting, or any other new/concerning symptoms then return to the ER or call 911.

## 2019-10-08 NOTE — ED Provider Notes (Signed)
3:36 PM Care assumed from Dr. Criss Alvine.  At time of transfer care, patient is awaiting reassessment after 1 L normal saline fluid bolus.  After fluids, anticipate discharge home with outpatient follow-up.  After fluids, patient felt well and was able to ambulate safely.  Patient discharged home per previous plan.  Patient discharged in good condition.  Clinical Impression: 1. Hypotension, unspecified hypotension type     Disposition: Discharge  Condition: Good  I have discussed the results, Dx and Tx plan with the pt(& family if present). He/she/they expressed understanding and agree(s) with the plan. Discharge instructions discussed at great length. Strict return precautions discussed and pt &/or family have verbalized understanding of the instructions. No further questions at time of discharge.    Discharge Medication List as of 10/08/2019  5:59 PM      Follow Up: Sheliah Hatch, MD [address] [place] [address] [phone]  Call in 2 days      Maccoy Haubner, Canary Brim, MD 10/09/19 [phone]

## 2019-10-10 DIAGNOSIS — J45909 Unspecified asthma, uncomplicated: Secondary | ICD-10-CM | POA: Diagnosis not present

## 2019-10-10 DIAGNOSIS — Z96653 Presence of artificial knee joint, bilateral: Secondary | ICD-10-CM | POA: Diagnosis not present

## 2019-10-10 DIAGNOSIS — Z471 Aftercare following joint replacement surgery: Secondary | ICD-10-CM | POA: Diagnosis not present

## 2019-10-10 DIAGNOSIS — M5440 Lumbago with sciatica, unspecified side: Secondary | ICD-10-CM | POA: Diagnosis not present

## 2019-10-10 DIAGNOSIS — E785 Hyperlipidemia, unspecified: Secondary | ICD-10-CM | POA: Diagnosis not present

## 2019-10-10 DIAGNOSIS — I10 Essential (primary) hypertension: Secondary | ICD-10-CM | POA: Diagnosis not present

## 2019-10-10 DIAGNOSIS — M47819 Spondylosis without myelopathy or radiculopathy, site unspecified: Secondary | ICD-10-CM | POA: Diagnosis not present

## 2019-10-10 DIAGNOSIS — E039 Hypothyroidism, unspecified: Secondary | ICD-10-CM | POA: Diagnosis not present

## 2019-10-10 DIAGNOSIS — M8580 Other specified disorders of bone density and structure, unspecified site: Secondary | ICD-10-CM | POA: Diagnosis not present

## 2019-10-10 DIAGNOSIS — K5900 Constipation, unspecified: Secondary | ICD-10-CM | POA: Diagnosis not present

## 2019-10-10 DIAGNOSIS — R3915 Urgency of urination: Secondary | ICD-10-CM | POA: Diagnosis not present

## 2019-10-10 DIAGNOSIS — Z981 Arthrodesis status: Secondary | ICD-10-CM | POA: Diagnosis not present

## 2019-10-10 DIAGNOSIS — Z7901 Long term (current) use of anticoagulants: Secondary | ICD-10-CM | POA: Diagnosis not present

## 2019-10-10 DIAGNOSIS — I480 Paroxysmal atrial fibrillation: Secondary | ICD-10-CM | POA: Diagnosis not present

## 2019-10-10 DIAGNOSIS — K219 Gastro-esophageal reflux disease without esophagitis: Secondary | ICD-10-CM | POA: Diagnosis not present

## 2019-10-10 DIAGNOSIS — G2581 Restless legs syndrome: Secondary | ICD-10-CM | POA: Diagnosis not present

## 2019-10-11 DIAGNOSIS — E039 Hypothyroidism, unspecified: Secondary | ICD-10-CM | POA: Diagnosis not present

## 2019-10-11 DIAGNOSIS — G2581 Restless legs syndrome: Secondary | ICD-10-CM | POA: Diagnosis not present

## 2019-10-11 DIAGNOSIS — E785 Hyperlipidemia, unspecified: Secondary | ICD-10-CM | POA: Diagnosis not present

## 2019-10-11 DIAGNOSIS — M47819 Spondylosis without myelopathy or radiculopathy, site unspecified: Secondary | ICD-10-CM | POA: Diagnosis not present

## 2019-10-11 DIAGNOSIS — J45909 Unspecified asthma, uncomplicated: Secondary | ICD-10-CM | POA: Diagnosis not present

## 2019-10-11 DIAGNOSIS — K219 Gastro-esophageal reflux disease without esophagitis: Secondary | ICD-10-CM | POA: Diagnosis not present

## 2019-10-11 DIAGNOSIS — R3915 Urgency of urination: Secondary | ICD-10-CM | POA: Diagnosis not present

## 2019-10-11 DIAGNOSIS — I10 Essential (primary) hypertension: Secondary | ICD-10-CM | POA: Diagnosis not present

## 2019-10-11 DIAGNOSIS — Z471 Aftercare following joint replacement surgery: Secondary | ICD-10-CM | POA: Diagnosis not present

## 2019-10-11 DIAGNOSIS — M5440 Lumbago with sciatica, unspecified side: Secondary | ICD-10-CM | POA: Diagnosis not present

## 2019-10-11 DIAGNOSIS — K5900 Constipation, unspecified: Secondary | ICD-10-CM | POA: Diagnosis not present

## 2019-10-11 DIAGNOSIS — Z7901 Long term (current) use of anticoagulants: Secondary | ICD-10-CM | POA: Diagnosis not present

## 2019-10-11 DIAGNOSIS — Z981 Arthrodesis status: Secondary | ICD-10-CM | POA: Diagnosis not present

## 2019-10-11 DIAGNOSIS — I480 Paroxysmal atrial fibrillation: Secondary | ICD-10-CM | POA: Diagnosis not present

## 2019-10-11 DIAGNOSIS — M8580 Other specified disorders of bone density and structure, unspecified site: Secondary | ICD-10-CM | POA: Diagnosis not present

## 2019-10-11 DIAGNOSIS — Z96653 Presence of artificial knee joint, bilateral: Secondary | ICD-10-CM | POA: Diagnosis not present

## 2019-10-12 ENCOUNTER — Ambulatory Visit (INDEPENDENT_AMBULATORY_CARE_PROVIDER_SITE_OTHER): Payer: Medicare Other | Admitting: Orthopedic Surgery

## 2019-10-12 ENCOUNTER — Other Ambulatory Visit: Payer: Self-pay

## 2019-10-12 ENCOUNTER — Encounter: Payer: Self-pay | Admitting: Orthopedic Surgery

## 2019-10-12 ENCOUNTER — Ambulatory Visit (INDEPENDENT_AMBULATORY_CARE_PROVIDER_SITE_OTHER): Payer: Medicare Other

## 2019-10-12 DIAGNOSIS — Z96651 Presence of right artificial knee joint: Secondary | ICD-10-CM | POA: Diagnosis not present

## 2019-10-12 MED ORDER — OXYCODONE HCL 5 MG PO TABS: 5.0000 mg | ORAL_TABLET | Freq: Four times a day (QID) | ORAL | 0 refills | Status: DC | PRN

## 2019-10-13 ENCOUNTER — Encounter: Payer: Self-pay | Admitting: Orthopedic Surgery
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
## 2019-10-14 ENCOUNTER — Encounter: Payer: Self-pay | Admitting: Physician Assistant

## 2019-10-14 ENCOUNTER — Ambulatory Visit: Payer: Medicare Other | Admitting: Physician Assistant

## 2019-10-14 ENCOUNTER — Other Ambulatory Visit: Payer: Self-pay

## 2019-10-14 VITALS — BP 110/64 | HR 68 | Ht 63.0 in | Wt 152.0 lb

## 2019-10-14 DIAGNOSIS — I491 Atrial premature depolarization: Secondary | ICD-10-CM

## 2019-10-14 DIAGNOSIS — Z96653 Presence of artificial knee joint, bilateral: Secondary | ICD-10-CM | POA: Diagnosis not present

## 2019-10-14 DIAGNOSIS — E039 Hypothyroidism, unspecified: Secondary | ICD-10-CM | POA: Diagnosis not present

## 2019-10-14 DIAGNOSIS — Z7901 Long term (current) use of anticoagulants: Secondary | ICD-10-CM | POA: Diagnosis not present

## 2019-10-14 DIAGNOSIS — K5900 Constipation, unspecified: Secondary | ICD-10-CM | POA: Diagnosis not present

## 2019-10-14 DIAGNOSIS — Z981 Arthrodesis status: Secondary | ICD-10-CM | POA: Diagnosis not present

## 2019-10-14 DIAGNOSIS — M8580 Other specified disorders of bone density and structure, unspecified site: Secondary | ICD-10-CM | POA: Diagnosis not present

## 2019-10-14 DIAGNOSIS — G2581 Restless legs syndrome: Secondary | ICD-10-CM | POA: Diagnosis not present

## 2019-10-14 DIAGNOSIS — J45909 Unspecified asthma, uncomplicated: Secondary | ICD-10-CM | POA: Diagnosis not present

## 2019-10-14 DIAGNOSIS — I480 Paroxysmal atrial fibrillation: Secondary | ICD-10-CM | POA: Diagnosis not present

## 2019-10-14 DIAGNOSIS — K219 Gastro-esophageal reflux disease without esophagitis: Secondary | ICD-10-CM | POA: Diagnosis not present

## 2019-10-14 DIAGNOSIS — M5440 Lumbago with sciatica, unspecified side: Secondary | ICD-10-CM | POA: Diagnosis not present

## 2019-10-14 DIAGNOSIS — I10 Essential (primary) hypertension: Secondary | ICD-10-CM

## 2019-10-14 DIAGNOSIS — E785 Hyperlipidemia, unspecified: Secondary | ICD-10-CM | POA: Diagnosis not present

## 2019-10-14 DIAGNOSIS — M47819 Spondylosis without myelopathy or radiculopathy, site unspecified: Secondary | ICD-10-CM | POA: Diagnosis not present

## 2019-10-14 DIAGNOSIS — R3915 Urgency of urination: Secondary | ICD-10-CM | POA: Diagnosis not present

## 2019-10-14 DIAGNOSIS — Z471 Aftercare following joint replacement surgery: Secondary | ICD-10-CM | POA: Diagnosis not present

## 2019-10-14 NOTE — Patient Instructions (Signed)
Medication Instructions:  Your physician recommends that you continue on your current medications as directed. Please refer to the Current Medication list given to you today.  *If you need a refill on your cardiac medications before your next appointment, please call your pharmacy*   Follow-Up: At [facility], you and your health needs are our priority.  As part of our continuing mission to provide you with exceptional heart care, we have created designated Provider Care Teams.  These Care Teams include your primary Cardiologist (physician) and Advanced Practice Providers (APPs -  Physician Assistants and Nurse Practitioners) who all work together to provide you with the care you need, when you need it.  We recommend signing up for the patient portal called "MyChart".  Sign up information is provided on this After Visit Summary.  MyChart is used to connect with patients for Virtual Visits (Telemedicine).  Patients are able to view lab/test results, encounter notes, upcoming appointments, etc.  Non-urgent messages can be sent to your provider as well.   To learn more about what you can do with MyChart, go to [email or web address].    Your next appointment:   12 month(s)  The format for your next appointment:   In Person  Provider:   You may see Little Ishikawa, MD or one of the following Advanced Practice Providers on your designated Care Team:    Theodore Demark, PA-C  Joni Reining, DNP, ANP  Cadence Fransico Michael, NP    Other Instructions Please call our office 2 months in advance to schedule your annual visit with Dr. Bjorn Pippin.
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
## 2019-10-16 ENCOUNTER — Other Ambulatory Visit: Payer: Self-pay | Admitting: Family Medicine

## 2019-10-17 ENCOUNTER — Encounter: Payer: Self-pay | Admitting: Physical Therapy

## 2019-10-17 ENCOUNTER — Ambulatory Visit (INDEPENDENT_AMBULATORY_CARE_PROVIDER_SITE_OTHER): Payer: Medicare Other | Admitting: Physical Therapy

## 2019-10-17 ENCOUNTER — Other Ambulatory Visit: Payer: Self-pay

## 2019-10-17 DIAGNOSIS — M25561 Pain in right knee: Secondary | ICD-10-CM

## 2019-10-17 DIAGNOSIS — M6281 Muscle weakness (generalized): Secondary | ICD-10-CM | POA: Diagnosis not present

## 2019-10-17 DIAGNOSIS — R600 Localized edema: Secondary | ICD-10-CM

## 2019-10-17 DIAGNOSIS — M25661 Stiffness of right knee, not elsewhere classified: Secondary | ICD-10-CM | POA: Diagnosis not present

## 2019-10-17 DIAGNOSIS — R2689 Other abnormalities of gait and mobility: Secondary | ICD-10-CM | POA: Diagnosis not present
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
## 2019-10-17 NOTE — Patient Instructions (Signed)
Access Code: [email or web address] URL: [email or web address] Date: 10/17/2019 Prepared by: Moshe Cipro  Exercises Supine Quad Set - 3 x daily - 7 x weekly - 1 sets - 10 reps - 5 sec hold Small Range Straight Leg Raise - 2-3 x daily - 7 x weekly - 1-2 sets - 10 reps Supine Heel Slide with Strap - 2-3 x daily - 7 x weekly - 1-2 sets - 10 reps Seated Long Arc Quad - 2-3 x daily - 7 x weekly - 1-2 sets - 10 reps Seated Ankle Pumps on Table - 2-3 x daily - 7 x weekly - 1 sets - 10 reps Seated Ankle Circles - 2-3 x daily - 7 x weekly - 1 sets - 10 reps Standing March with Counter Support - 1 x daily - 7 x weekly - 20 reps - 1 sets Standing Knee Flexion with Counter Support - 1 x daily - 7 x weekly - 20 reps - 1 sets Standing Hip Abduction with Counter Support - 1 x daily - 7 x weekly - 20 reps - 1 sets

## 2019-10-19 ENCOUNTER — Encounter: Payer: Self-pay | Admitting: Rehabilitative and Restorative Service Providers"

## 2019-10-19 ENCOUNTER — Other Ambulatory Visit: Payer: Self-pay

## 2019-10-19 ENCOUNTER — Ambulatory Visit: Payer: Medicare Other | Admitting: Rehabilitative and Restorative Service Providers"

## 2019-10-19 DIAGNOSIS — M25561 Pain in right knee: Secondary | ICD-10-CM

## 2019-10-19 DIAGNOSIS — R2689 Other abnormalities of gait and mobility: Secondary | ICD-10-CM | POA: Diagnosis not present

## 2019-10-19 DIAGNOSIS — M6281 Muscle weakness (generalized): Secondary | ICD-10-CM

## 2019-10-19 DIAGNOSIS — M25661 Stiffness of right knee, not elsewhere classified: Secondary | ICD-10-CM | POA: Diagnosis not present

## 2019-10-19 DIAGNOSIS — R600 Localized edema: Secondary | ICD-10-CM

## 2019-10-19 NOTE — Patient Instructions (Signed)
Access Code: [email or web address] URL: [email or web address] Date: 10/19/2019 Prepared by: Pauletta Browns  Exercises for HEP  Supine Quadricep Sets - 3-5 x daily - 7 x weekly - 1 sets - 10 reps - 5 hold  Tailgate knee flexion AROM 3 minutes 3X/Day
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
## 2019-10-20 ENCOUNTER — Encounter: Payer: Medicare Other | Admitting: Physical Therapy

## 2019-10-20 ENCOUNTER — Other Ambulatory Visit: Payer: Self-pay | Admitting: Orthopedic Surgery

## 2019-10-20 NOTE — Telephone Encounter (Signed)
Pls advise.  

## 2019-10-24 ENCOUNTER — Telehealth: Payer: Self-pay | Admitting: Orthopedic Surgery

## 2019-10-24 ENCOUNTER — Other Ambulatory Visit: Payer: Self-pay | Admitting: Surgical

## 2019-10-24 MED ORDER — OXYCODONE HCL 5 MG PO TABS: 5.0000 mg | ORAL_TABLET | Freq: Three times a day (TID) | ORAL | 0 refills | Status: DC | PRN

## 2019-10-24 NOTE — Telephone Encounter (Signed)
Pt called stating she has an appt on 10/27/19 but needs a refill of her oxycodone 5 sent to her [facility] pharmacy. Pt would like a call once  It's been sent in.   [phone]

## 2019-10-24 NOTE — Telephone Encounter (Signed)
Pls advise thanks.  

## 2019-10-24 NOTE — Telephone Encounter (Signed)
Pt advised.

## 2019-10-25 ENCOUNTER — Other Ambulatory Visit: Payer: Self-pay

## 2019-10-25 ENCOUNTER — Ambulatory Visit: Payer: Medicare Other | Admitting: Physical Therapy

## 2019-10-25 ENCOUNTER — Encounter: Payer: Self-pay | Admitting: Physical Therapy

## 2019-10-25 DIAGNOSIS — R2689 Other abnormalities of gait and mobility: Secondary | ICD-10-CM

## 2019-10-25 DIAGNOSIS — M25561 Pain in right knee: Secondary | ICD-10-CM | POA: Diagnosis not present

## 2019-10-25 DIAGNOSIS — M6281 Muscle weakness (generalized): Secondary | ICD-10-CM | POA: Diagnosis not present

## 2019-10-25 DIAGNOSIS — R600 Localized edema: Secondary | ICD-10-CM

## 2019-10-25 DIAGNOSIS — M25661 Stiffness of right knee, not elsewhere classified: Secondary | ICD-10-CM | POA: Diagnosis not present
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
## 2019-10-26 ENCOUNTER — Encounter: Payer: Medicare Other | Admitting: Physical Therapy

## 2019-10-27 ENCOUNTER — Ambulatory Visit: Payer: Medicare Other | Admitting: Physical Therapy

## 2019-10-27 ENCOUNTER — Ambulatory Visit: Payer: Self-pay

## 2019-10-27 ENCOUNTER — Other Ambulatory Visit: Payer: Self-pay

## 2019-10-27 ENCOUNTER — Encounter: Payer: Self-pay | Admitting: Physical Therapy

## 2019-10-27 ENCOUNTER — Ambulatory Visit: Payer: Medicare Other | Admitting: Surgical

## 2019-10-27 DIAGNOSIS — R600 Localized edema: Secondary | ICD-10-CM | POA: Diagnosis not present

## 2019-10-27 DIAGNOSIS — M25571 Pain in right ankle and joints of right foot: Secondary | ICD-10-CM

## 2019-10-27 DIAGNOSIS — M25661 Stiffness of right knee, not elsewhere classified: Secondary | ICD-10-CM

## 2019-10-27 DIAGNOSIS — M25561 Pain in right knee: Secondary | ICD-10-CM | POA: Diagnosis not present

## 2019-10-27 DIAGNOSIS — M6281 Muscle weakness (generalized): Secondary | ICD-10-CM

## 2019-10-27 DIAGNOSIS — R2689 Other abnormalities of gait and mobility: Secondary | ICD-10-CM
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
## 2019-10-29 ENCOUNTER — Encounter: Payer: Self-pay | Admitting: Surgical

## 2019-10-29 DIAGNOSIS — M25571 Pain in right ankle and joints of right foot: Secondary | ICD-10-CM

## 2019-10-29 MED ORDER — LIDOCAINE HCL 1 % IJ SOLN
3.0000 mL | INTRAMUSCULAR | Status: AC | PRN
  Administered 2019-10-29: 3 mL

## 2019-10-29 MED ORDER — BUPIVACAINE HCL 0.5 % IJ SOLN
1.0000 mL | INTRAMUSCULAR | Status: AC | PRN
  Administered 2019-10-29: 1 mL via INTRA_ARTICULAR
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
## 2019-10-29 NOTE — Progress Notes (Signed)
   Procedure Note  Patient: Melissa Jordan             Date of Birth: [birth year]           MRN: [id number]             Visit Date: 10/27/2019  Procedures: Visit Diagnoses:  1. Pain in right ankle and joints of right foot     Medium Joint Inj on 10/29/2019 4:07 PM Indications: pain, joint swelling and diagnostic evaluation Details: 22 G 1.5 in needle, fluoroscopy-guided anteromedial approach Medications: 3 mL lidocaine 1 %; 1 mL bupivacaine 0.5 % (Toradol 2 mL) Outcome: tolerated well, no immediate complications Procedure, treatment alternatives, risks and benefits explained, specific risks discussed. Consent was given by the patient. Immediately prior to procedure a time out was called to verify the correct patient, procedure, equipment, support staff and site/side marked as required. Patient was prepped and draped in the usual sterile fashion.

## 2019-10-31 ENCOUNTER — Ambulatory Visit (INDEPENDENT_AMBULATORY_CARE_PROVIDER_SITE_OTHER): Payer: Medicare Other | Admitting: Physical Therapy

## 2019-10-31 ENCOUNTER — Other Ambulatory Visit: Payer: Self-pay

## 2019-10-31 DIAGNOSIS — R2689 Other abnormalities of gait and mobility: Secondary | ICD-10-CM | POA: Diagnosis not present

## 2019-10-31 DIAGNOSIS — M25561 Pain in right knee: Secondary | ICD-10-CM | POA: Diagnosis not present

## 2019-10-31 DIAGNOSIS — M25661 Stiffness of right knee, not elsewhere classified: Secondary | ICD-10-CM | POA: Diagnosis not present

## 2019-10-31 DIAGNOSIS — M6281 Muscle weakness (generalized): Secondary | ICD-10-CM

## 2019-10-31 DIAGNOSIS — R600 Localized edema: Secondary | ICD-10-CM
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
## 2019-11-02 ENCOUNTER — Encounter: Payer: Medicare Other | Admitting: Physical Therapy

## 2019-11-03 ENCOUNTER — Telehealth: Payer: Self-pay | Admitting: Emergency Medicine

## 2019-11-03 NOTE — Telephone Encounter (Signed)
If pt had close, unmasked contact with someone that is COVID + she needs to quarantine for 10 days.  If at any time during those 10 days she develops symptoms she will need to be tested.  If after 10 days she has no symptoms she is able to return to PT and other activities but should wear a mask around those who may not be vaccinated.

## 2019-11-03 NOTE — Telephone Encounter (Signed)
Patient called wanting some reassurance of what she needs to do Patient was recently exposed to a family member in the care who has had 2 negative rapid COVID test but her recent test was positive. Patient denies any current symptoms. Patient is fully vaccinated and neither were wearing a mask in the car. She is taking physical therapy and wanted to know what she should do before going back to Physical therapy.

## 2019-11-03 NOTE — Telephone Encounter (Signed)
Called and LMVOM to inform pt of PCP recommendations.

## 2019-11-04 ENCOUNTER — Encounter: Payer: Medicare Other | Admitting: Physical Therapy

## 2019-11-08 DIAGNOSIS — M461 Sacroiliitis, not elsewhere classified: Secondary | ICD-10-CM | POA: Diagnosis not present

## 2019-11-09 ENCOUNTER — Other Ambulatory Visit: Payer: Self-pay

## 2019-11-09 ENCOUNTER — Ambulatory Visit (INDEPENDENT_AMBULATORY_CARE_PROVIDER_SITE_OTHER): Payer: Medicare Other | Admitting: Orthopedic Surgery

## 2019-11-09 ENCOUNTER — Encounter: Payer: Self-pay | Admitting: Orthopedic Surgery

## 2019-11-09 DIAGNOSIS — Z96651 Presence of right artificial knee joint: Secondary | ICD-10-CM

## 2019-11-09 MED ORDER — ZOLPIDEM TARTRATE ER 6.25 MG PO TBCR: 6.2500 mg | EXTENDED_RELEASE_TABLET | Freq: Every evening | ORAL | 0 refills | Status: DC | PRN

## 2019-11-11 ENCOUNTER — Encounter: Payer: Self-pay | Admitting: Orthopedic Surgery
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
## 2019-11-13 ENCOUNTER — Other Ambulatory Visit: Payer: Self-pay | Admitting: Family Medicine

## 2019-11-21 ENCOUNTER — Other Ambulatory Visit: Payer: Self-pay | Admitting: Surgical

## 2019-11-21 MED ORDER — RAMELTEON 8 MG PO TABS: 8.0000 mg | ORAL_TABLET | Freq: Every evening | ORAL | 1 refills | Status: DC | PRN

## 2019-11-24 ENCOUNTER — Telehealth: Payer: Self-pay | Admitting: *Deleted

## 2019-11-24 NOTE — Telephone Encounter (Signed)
Clinical pharmacist to review Eliquis 

## 2019-11-24 NOTE — Telephone Encounter (Signed)
   [facility] Pre-operative Risk Assessment     Request for surgical clearance:  1. What type of surgery is being performed? Bilateral SI Joint injection   2. When is this surgery scheduled? TBD   3. What type of clearance is required (medical clearance vs. Pharmacy clearance to hold med vs. Both)? pharmacy  4. Are there any medications that need to be held prior to surgery and how long? Eliquis x 3 days   5. Practice name and name of physician performing surgery? [facility] Dr. Maryjean Ka   6. What is the office phone number? [phone]   7.   What is the office fax number? [phone]  8.   Anesthesia type (None, local, MAC, general) ?    Tresha Muzio A Cyla Haluska 11/24/2019, 10:15 AM  _________________________________________________________________   (provider comments below)

## 2019-11-25 ENCOUNTER — Other Ambulatory Visit: Payer: Self-pay | Admitting: General Practice

## 2019-11-25 NOTE — Telephone Encounter (Signed)
Last OV 05/13/19 Requip last filled 10/17/19 #90 with 3

## 2019-11-25 NOTE — Telephone Encounter (Signed)
Patient with diagnosis of afib on Eliquis for anticoagulation.    Procedure:  Bilateral SI Joint injection  Date of procedure: TBD  CHADS2-VASc score of  3 (HTN, AGE, female)  CrCl 47 ml/min  Per office protocol, patient can hold Eliquis for 3 days prior to procedure.

## 2019-11-28 MED ORDER — ROPINIROLE HCL 1 MG PO TABS: 1.0000 mg | ORAL_TABLET | Freq: Every day | ORAL | 3 refills | Status: DC

## 2019-11-28 MED ORDER — METOPROLOL TARTRATE 25 MG PO TABS: 25.0000 mg | ORAL_TABLET | Freq: Two times a day (BID) | ORAL | 1 refills | Status: DC

## 2019-12-19 DIAGNOSIS — M461 Sacroiliitis, not elsewhere classified: Secondary | ICD-10-CM | POA: Diagnosis not present

## 2019-12-21 ENCOUNTER — Ambulatory Visit (INDEPENDENT_AMBULATORY_CARE_PROVIDER_SITE_OTHER): Payer: Medicare Other | Admitting: Orthopedic Surgery

## 2019-12-21 DIAGNOSIS — Z96651 Presence of right artificial knee joint: Secondary | ICD-10-CM

## 2019-12-26 ENCOUNTER — Encounter: Payer: Self-pay | Admitting: Orthopedic Surgery
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
## 2020-01-12 DIAGNOSIS — M5440 Lumbago with sciatica, unspecified side: Secondary | ICD-10-CM | POA: Diagnosis not present

## 2020-01-30 ENCOUNTER — Encounter: Payer: Self-pay | Admitting: Family Medicine

## 2020-01-30 ENCOUNTER — Telehealth (INDEPENDENT_AMBULATORY_CARE_PROVIDER_SITE_OTHER): Payer: Medicare Other | Admitting: Family Medicine

## 2020-01-30 ENCOUNTER — Other Ambulatory Visit: Payer: Self-pay

## 2020-01-30 VITALS — BP 120/70

## 2020-01-30 DIAGNOSIS — I10 Essential (primary) hypertension: Secondary | ICD-10-CM | POA: Diagnosis not present

## 2020-01-30 DIAGNOSIS — E039 Hypothyroidism, unspecified: Secondary | ICD-10-CM

## 2020-01-30 DIAGNOSIS — R5383 Other fatigue: Secondary | ICD-10-CM | POA: Diagnosis not present

## 2020-01-30 DIAGNOSIS — R290 Tetany: Secondary | ICD-10-CM

## 2020-01-30 NOTE — Progress Notes (Signed)
Virtual Visit via Video   I connected with patient on 01/30/20 at  4:00 PM EDT by a video enabled telemedicine application and verified that I am speaking with the correct person using two identifiers.  Location patient: Home Location provider: [facility], Office Persons participating in the virtual visit: Patient, Provider, CMA (Jess B)  I discussed the limitations of evaluation and management by telemedicine and the availability of in person appointments. The patient expressed understanding and agreed to proceed.  Subjective:   HPI:   Fatigue- 'i'm just exhausted all the time.  I go to bed exhausted, I wake up exhausted.'  Pt feels that she is sleeping at night but is ready to go back to bed by lunch.  Pt reports this has been gradually building.  'this is not normal'.  No snoring at night, no breathing pauses.  HTN- chronic problem, on Losartan 50mg  daily, Metoprolol 25mg  BID w/ good control  Hypothyroid- chronic problem, on daily  Cramps of hands/feet- severe.  Occurring 2-4x/day.  Pt reports good water intake.  Is taking magnesium supplement.  ROS:   See pertinent positives and negatives per HPI.  Patient Active Problem List   Diagnosis Date Noted  . 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 Traumatic arthritis of right ankle   . Pain in right ankle and joints of right foot 08/05/2018  . A-fib (HCC) 03/10/2017  . 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 RLS (restless legs syndrome) 01/11/2015  . Osteopenia 05/15/2014  . Hyperlipidemia 05/24/2013  . Routine general medical examination at a health care facility 03/29/2012  . POSTMENOPAUSAL SYNDROME 09/26/2009  . URINARY URGENCY 09/26/2009  . ANEMIA 01/09/2009  . Back pain of lumbar region with sciatica 01/09/2009  . LAMINECTOMY, LUMBAR, HX OF 08/09/2008  . Hypothyroid 03/03/2007  . HYPERTENSION, BENIGN 03/03/2007  . GERD 02/10/2007    Social  History   Tobacco Use  . Smoking status: Never Smoker  . Smokeless tobacco: Never Used  Substance Use Topics  . Alcohol use: No    Current Outpatient Medications:  .  acetaminophen (TYLENOL) 500 MG tablet, Take 1,000 mg by mouth every 6 (six) hours as needed for moderate pain. , Disp: , Rfl:  .  apixaban (ELIQUIS) 5 MG TABS tablet, Take 1 tablet (5 mg total) by mouth 2 (two) times daily., Disp: 180 tablet, Rfl: 3 .  Biotin 5000 MCG CAPS, Take 5,000 mcg by mouth daily., Disp: , Rfl:  .  CALCIUM-VITAMIN D PO, Take 1 tablet by mouth daily. , Disp: , Rfl:  .  cholecalciferol (VITAMIN D) 1000 units tablet, Take 1,000 Units by mouth daily., Disp: , Rfl:  .  docusate sodium (COLACE) 100 MG capsule, Take 1 capsule (100 mg total) by mouth 2 (two) times daily., Disp: 10 capsule, Rfl: 0 .  estradiol (ESTRACE) 1 MG tablet, Take 1 tablet by mouth once daily, Disp: 90 tablet, Rfl: 0 .  fluticasone (FLONASE) 50 MCG/ACT nasal spray, Place 2 sprays into both nostrils daily. , Disp: , Rfl:  .  gabapentin (NEURONTIN) 300 MG capsule, Take 1 capsule (300 mg total) by mouth 3 (three) times daily., Disp: 30 capsule, Rfl: 0 .  hydroxypropyl methylcellulose / hypromellose (ISOPTO TEARS / GONIOVISC) 2.5 % ophthalmic solution, Place 1 drop into both eyes daily., Disp: , Rfl:  .  levothyroxine (SYNTHROID) 75 MCG tablet, TAKE 1 TABLET BY MOUTH  DAILY (Patient taking differently: Take 75 mcg by mouth  daily before breakfast. ), Disp: 90 tablet, Rfl: 3 .  losartan (COZAAR) 50 MG tablet, Take 50 mg by mouth daily., Disp: , Rfl:  .  magnesium oxide (MAG-OX) 400 MG tablet, Take 400 mg by mouth daily., Disp: , Rfl:  .  menthol-cetylpyridinium (CEPACOL) 3 MG lozenge, Take 1 lozenge (3 mg total) by mouth as needed for sore throat (sore throat)., Disp: 100 tablet, Rfl: 12 .  metoprolol tartrate (LOPRESSOR) 25 MG tablet, Take 1 tablet (25 mg total) by mouth 2 (two) times daily., Disp: 180 tablet, Rfl: 1 .  ramelteon (ROZEREM) 8 MG  tablet, Take 1 tablet (8 mg total) by mouth at bedtime as needed for sleep., Disp: 30 tablet, Rfl: 1 .  rOPINIRole (REQUIP) 1 MG tablet, Take 1 tablet (1 mg total) by mouth at bedtime., Disp: 90 tablet, Rfl: 3 .  zolpidem (AMBIEN CR) 6.25 MG CR tablet, Take 1 tablet (6.25 mg total) by mouth at bedtime as needed for sleep. Take every other day as needed. Do not take with narcotic medications, Disp: 30 tablet, Rfl: 0  Allergies  Allergen Reactions  . Lyrica [Pregabalin] Palpitations and Other (See Comments)    "thought I was going to have a heart attack; heart started pounding so hard, I couldn't catch my breath" (06/28/2012)  . Meclizine Other (See Comments)    "what was in my mind to say wasn't what came out to say; I was kind of in another world" (06/28/2012)  . Penicillins Itching and Rash    Has patient had a PCN reaction causing immediate rash, facial/tongue/throat swelling, SOB or lightheadedness with hypotension: No Has patient had a PCN reaction causing severe rash involving mucus membranes or skin necrosis: No Has patient had a PCN reaction that required hospitalization No Has patient had a PCN reaction occurring within the last 10 years:   # # YES # #  If all of the above answers are "NO", then may proceed with Cephalosporin use.   . Poison Sumac Extract Swelling, Rash and Other (See Comments)    Blisters  . Bactrim [Sulfamethoxazole-Trimethoprim] Rash  . Codeine Nausea Only  . Morphine Sulfate Rash  . Propoxyphene N-Acetaminophen Nausea And Vomiting    "Darvocet"    Objective:   BP 120/70  AAOx3, NAD NCAT, EOMI No obvious CN deficits Coloring WNL Pt is able to speak clearly, coherently without shortness of breath or increased work of breathing.  Thought process is linear.  Mood is appropriate.    Assessment and Plan:   Fatigue- new.  Pt reports this has been gradually building.  She says she is just 'exhausted'.  Feels she is sleeping at night but wakes tired and spends  the day tired.  Does have thyroid issues, on beta blocker.  Denies snoring or breathing pauses.  Will check labs to assess for metabolic causes.  Pt expressed understanding and is in agreement w/ plan.   HTN- chronic problem, currently well controlled.  Check labs.  No anticipated med changes.  Will follow.  Hypothyroid- ongoing issue for pt.  Due to her fatigue will check labs and adjust meds prn.  Carpopedal spasm- new.  Pt reports this is happening multiple times/day.  Reviewed need for adequate hydration.  Discussed tonic water and mustard for symptom relief.  Check K+ and Mg to assess electrolytes.  Pt expressed understanding and is in agreement w/ plan.    Neena Rhymes, MD 01/30/2020

## 2020-01-30 NOTE — Progress Notes (Signed)
I have discussed the procedure for the virtual visit with the patient who has given consent to proceed with assessment and treatment.   Jaevin Medearis L Chara Marquard, CMA     

## 2020-01-31 ENCOUNTER — Ambulatory Visit (INDEPENDENT_AMBULATORY_CARE_PROVIDER_SITE_OTHER): Payer: Medicare Other

## 2020-01-31 DIAGNOSIS — R290 Tetany: Secondary | ICD-10-CM | POA: Diagnosis not present

## 2020-01-31 DIAGNOSIS — E039 Hypothyroidism, unspecified: Secondary | ICD-10-CM | POA: Diagnosis not present

## 2020-01-31 DIAGNOSIS — R5383 Other fatigue: Secondary | ICD-10-CM | POA: Diagnosis not present

## 2020-01-31 DIAGNOSIS — I10 Essential (primary) hypertension: Secondary | ICD-10-CM

## 2020-01-31 LAB — LIPID PANEL
Cholesterol: 183 mg/dL (ref 0–200)
HDL: 59.7 mg/dL (ref >39.00)
LDL Cholesterol: 100 mg/dL — ABNORMAL HIGH (ref 0–99)
NonHDL: 123.17
Total CHOL/HDL Ratio: 3
Triglycerides: 114 mg/dL (ref 0.0–149.0)
VLDL: 22.8 mg/dL (ref 0.0–40.0)

## 2020-01-31 LAB — CBC WITH DIFFERENTIAL/PLATELET
Basophils Absolute: 0.1 10*3/uL (ref 0.0–0.1)
Basophils Relative: 1.1 % (ref 0.0–3.0)
Eosinophils Absolute: 0.1 10*3/uL (ref 0.0–0.7)
Eosinophils Relative: 1.4 % (ref 0.0–5.0)
HCT: 38.7 % (ref 36.0–46.0)
Hemoglobin: 12.9 g/dL (ref 12.0–15.0)
Lymphocytes Relative: 20.7 % (ref 12.0–46.0)
Lymphs Abs: 1.7 10*3/uL (ref 0.7–4.0)
MCHC: 33.4 g/dL (ref 30.0–36.0)
MCV: 88.3 fl (ref 78.0–100.0)
Monocytes Absolute: 0.7 10*3/uL (ref 0.1–1.0)
Monocytes Relative: 8.7 % (ref 3.0–12.0)
Neutro Abs: 5.6 10*3/uL (ref 1.4–7.7)
Neutrophils Relative %: 68.1 % (ref 43.0–77.0)
Platelets: 215 10*3/uL (ref 150.0–400.0)
RBC: 4.38 Mil/uL (ref 3.87–5.11)
RDW: 14.9 % (ref 11.5–15.5)
WBC: 8.2 10*3/uL (ref 4.0–10.5)

## 2020-01-31 LAB — HEPATIC FUNCTION PANEL
ALT: 9 U/L (ref 0–35)
AST: 12 U/L (ref 0–37)
Albumin: 4 g/dL (ref 3.5–5.2)
Alkaline Phosphatase: 80 U/L (ref 39–117)
Bilirubin, Direct: 0.1 mg/dL (ref 0.0–0.3)
Total Bilirubin: 0.5 mg/dL (ref 0.2–1.2)
Total Protein: 6 g/dL (ref 6.0–8.3)

## 2020-01-31 LAB — TSH: TSH: 0.95 u[IU]/mL (ref 0.35–4.50)

## 2020-01-31 LAB — BASIC METABOLIC PANEL
BUN: 25 mg/dL — ABNORMAL HIGH (ref 6–23)
CO2: 30 mEq/L (ref 19–32)
Calcium: 9.2 mg/dL (ref 8.4–10.5)
Chloride: 103 mEq/L (ref 96–112)
Creatinine, Ser: 1 mg/dL (ref 0.40–1.20)
GFR: 54.19 mL/min — ABNORMAL LOW (ref >60.00)
Glucose, Bld: 68 mg/dL — ABNORMAL LOW (ref 70–99)
Potassium: 4 mEq/L (ref 3.5–5.1)
Sodium: 140 mEq/L (ref 135–145)

## 2020-01-31 LAB — MAGNESIUM: Magnesium: 1.9 mg/dL (ref 1.5–2.5)

## 2020-01-31 LAB — B12 AND FOLATE PANEL
Folate: 11.1 ng/mL (ref >5.9)
Vitamin B-12: 409 pg/mL (ref 211–911)

## 2020-01-31 LAB — VITAMIN D 25 HYDROXY (VIT D DEFICIENCY, FRACTURES): VITD: 56.69 ng/mL (ref 30.00–100.00)

## 2020-02-10 ENCOUNTER — Other Ambulatory Visit: Payer: Self-pay | Admitting: Family Medicine

## 2020-02-15 DIAGNOSIS — G894 Chronic pain syndrome: Secondary | ICD-10-CM | POA: Insufficient documentation

## 2020-02-15 DIAGNOSIS — M47816 Spondylosis without myelopathy or radiculopathy, lumbar region: Secondary | ICD-10-CM | POA: Diagnosis not present

## 2020-02-15 DIAGNOSIS — Z9889 Other specified postprocedural states: Secondary | ICD-10-CM | POA: Insufficient documentation

## 2020-02-19 ENCOUNTER — Other Ambulatory Visit: Payer: Self-pay

## 2020-02-19 ENCOUNTER — Encounter: Payer: Self-pay | Admitting: Emergency Medicine

## 2020-02-19 ENCOUNTER — Ambulatory Visit (INDEPENDENT_AMBULATORY_CARE_PROVIDER_SITE_OTHER): Payer: Medicare Other

## 2020-02-19 ENCOUNTER — Ambulatory Visit
Admission: EM | Admit: 2020-02-19 | Discharge: 2020-02-19 | Disposition: A | Discharge status: Home / Self Care | Payer: Medicare Other | Attending: Emergency Medicine | Admitting: Emergency Medicine

## 2020-02-19 DIAGNOSIS — S91032A Puncture wound without foreign body, left ankle, initial encounter: Secondary | ICD-10-CM

## 2020-02-19 DIAGNOSIS — M7989 Other specified soft tissue disorders: Secondary | ICD-10-CM | POA: Diagnosis not present

## 2020-02-19 DIAGNOSIS — S91002A Unspecified open wound, left ankle, initial encounter: Secondary | ICD-10-CM

## 2020-02-19 MED ORDER — MUPIROCIN CALCIUM 2 % EX CREA
1.0000 | TOPICAL_CREAM | Freq: Two times a day (BID) | CUTANEOUS | 0 refills | Status: DC
Start: 2020-02-19 — End: 2020-04-12
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
## 2020-02-19 NOTE — ED Triage Notes (Signed)
Pt here for wound check to left ankle after getting wound 10 days ago that is now swelling and red

## 2020-02-19 NOTE — Discharge Instructions (Addendum)
Keep skin clean - may use gentle soaps without perfumes/dyes. Avoid hot water (showers, baths) as this can further dry out and irritate skin. Pat skin dry as rubbing can irritate and tear skin. Apply bactroban daily.

## 2020-03-06 DIAGNOSIS — M47816 Spondylosis without myelopathy or radiculopathy, lumbar region: Secondary | ICD-10-CM | POA: Diagnosis not present

## 2020-03-07 ENCOUNTER — Telehealth (INDEPENDENT_AMBULATORY_CARE_PROVIDER_SITE_OTHER): Payer: Medicare Other | Admitting: Physician Assistant

## 2020-03-07 ENCOUNTER — Encounter: Payer: Self-pay | Admitting: Physician Assistant

## 2020-03-07 DIAGNOSIS — R300 Dysuria: Secondary | ICD-10-CM | POA: Diagnosis not present

## 2020-03-07 MED ORDER — CEPHALEXIN 500 MG PO CAPS: 500.0000 mg | ORAL_CAPSULE | Freq: Two times a day (BID) | ORAL | 0 refills | Status: AC

## 2020-03-07 NOTE — Progress Notes (Signed)
Virtual Visit via Video   I connected with patient on 03/07/20 at  1:30 PM EDT by a video enabled telemedicine application and verified that I am speaking with the correct person using two identifiers.  Location patient: Home Location provider: [place] [facility], Office Persons participating in the virtual visit: Patient, Provider, CMA (Patina Moore)  I discussed the limitations of evaluation and management by telemedicine and the availability of in person appointments. The patient expressed understanding and agreed to proceed.  Subjective:   HPI:   Patient presents via [facility] today c/o dysuria starting yesterday afternoon/evening. Notes urinary urgency, frequency, hesitancy. Notes foul-smelling urine. Denies fever, chills, back pain. Denies nausea or vomiting.  ROS:   See pertinent positives and negatives per HPI.  Patient Active Problem List   Diagnosis Date Noted  . 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 Traumatic arthritis of right ankle   . Pain in right ankle and joints of right foot 08/05/2018  . A-fib (HCC) 03/10/2017  . 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 RLS (restless legs syndrome) 01/11/2015  . Osteopenia 05/15/2014  . Hyperlipidemia 05/24/2013  . Routine general medical examination at a health care facility 03/29/2012  . POSTMENOPAUSAL SYNDROME 09/26/2009  . URINARY URGENCY 09/26/2009  . ANEMIA 01/09/2009  . Back pain of lumbar region with sciatica 01/09/2009  . LAMINECTOMY, LUMBAR, HX OF 08/09/2008  . Hypothyroid 03/03/2007  . HYPERTENSION, BENIGN 03/03/2007  . GERD 02/10/2007    Social History   Tobacco Use  . Smoking status: Never Smoker  . Smokeless tobacco: Never Used  Substance Use Topics  . Alcohol use: No    Current Outpatient Medications:  .  acetaminophen (TYLENOL) 500 MG tablet, Take 1,000 mg by mouth every 6 (six) hours as needed for moderate pain. , Disp: , Rfl:  .   apixaban (ELIQUIS) 5 MG TABS tablet, Take 1 tablet (5 mg total) by mouth 2 (two) times daily., Disp: 180 tablet, Rfl: 3 .  Biotin 5000 MCG CAPS, Take 5,000 mcg by mouth daily., Disp: , Rfl:  .  CALCIUM-VITAMIN D PO, Take 1 tablet by mouth daily. , Disp: , Rfl:  .  cholecalciferol (VITAMIN D) 1000 units tablet, Take 1,000 Units by mouth daily., Disp: , Rfl:  .  docusate sodium (COLACE) 100 MG capsule, Take 1 capsule (100 mg total) by mouth 2 (two) times daily., Disp: 10 capsule, Rfl: 0 .  estradiol (ESTRACE) 1 MG tablet, Take 1 tablet by mouth once daily, Disp: 90 tablet, Rfl: 0 .  fluticasone (FLONASE) 50 MCG/ACT nasal spray, Place 2 sprays into both nostrils daily. , Disp: , Rfl:  .  gabapentin (NEURONTIN) 300 MG capsule, Take 1 capsule (300 mg total) by mouth 3 (three) times daily., Disp: 30 capsule, Rfl: 0 .  hydroxypropyl methylcellulose / hypromellose (ISOPTO TEARS / GONIOVISC) 2.5 % ophthalmic solution, Place 1 drop into both eyes daily., Disp: , Rfl:  .  levothyroxine (SYNTHROID) 75 MCG tablet, TAKE 1 TABLET BY MOUTH  DAILY (Patient taking differently: Take 75 mcg by mouth daily before breakfast. ), Disp: 90 tablet, Rfl: 3 .  losartan (COZAAR) 50 MG tablet, Take 50 mg by mouth daily., Disp: , Rfl:  .  magnesium oxide (MAG-OX) 400 MG tablet, Take 400 mg by mouth daily., Disp: , Rfl:  .  menthol-cetylpyridinium (CEPACOL) 3 MG lozenge, Take 1 lozenge (3 mg total) by mouth as needed for sore throat (sore throat)., Disp: 100 tablet, Rfl:  12 .  metoprolol tartrate (LOPRESSOR) 25 MG tablet, Take 1 tablet (25 mg total) by mouth 2 (two) times daily., Disp: 180 tablet, Rfl: 1 .  mupirocin cream (BACTROBAN) 2 %, Apply 1 application topically 2 (two) times daily., Disp: 15 g, Rfl: 0 .  ramelteon (ROZEREM) 8 MG tablet, Take 1 tablet (8 mg total) by mouth at bedtime as needed for sleep., Disp: 30 tablet, Rfl: 1 .  rOPINIRole (REQUIP) 1 MG tablet, Take 1 tablet (1 mg total) by mouth at bedtime., Disp: 90  tablet, Rfl: 3 .  zolpidem (AMBIEN CR) 6.25 MG CR tablet, Take 1 tablet (6.25 mg total) by mouth at bedtime as needed for sleep. Take every other day as needed. Do not take with narcotic medications, Disp: 30 tablet, Rfl: 0  Allergies  Allergen Reactions  . Lyrica [Pregabalin] Palpitations and Other (See Comments)    "thought I was going to have a heart attack; heart started pounding so hard, I couldn't catch my breath" (06/28/2012)  . Meclizine Other (See Comments)    "what was in my mind to say wasn't what came out to say; I was kind of in another world" (06/28/2012)  . Penicillins Itching and Rash    Has patient had a PCN reaction causing immediate rash, facial/tongue/throat swelling, SOB or lightheadedness with hypotension: No Has patient had a PCN reaction causing severe rash involving mucus membranes or skin necrosis: No Has patient had a PCN reaction that required hospitalization No Has patient had a PCN reaction occurring within the last 10 years:   # # YES # #  If all of the above answers are "NO", then may proceed with Cephalosporin use.   . Poison Sumac Extract Swelling, Rash and Other (See Comments)    Blisters  . Bactrim [Sulfamethoxazole-Trimethoprim] Rash  . Codeine Nausea Only  . Morphine Sulfate Rash  . Propoxyphene N-Acetaminophen Nausea And Vomiting    "Darvocet"    Objective:   There were no vitals taken for this visit.  Patient is well-developed, well-nourished in no acute distress.  Resting comfortably at home.  Head is normocephalic, atraumatic.  No labored breathing.  Speech is clear and coherent with logical content.  Patient is alert and oriented at baseline.   Assessment and Plan:   1. Dysuria Classic symptoms. No alarm signs/symptoms present. Will start empiric treatment for UTI with Keflex. Supportive measures and OTC medications reviewed. If not improving she will need in-office visit for UA/culture and examination. Patient voiced understanding and  agreement with the plan.    Piedad Climes, PA-C 03/07/2020

## 2020-03-07 NOTE — Patient Instructions (Signed)
Instructions sent to MyChart

## 2020-03-13 DIAGNOSIS — M461 Sacroiliitis, not elsewhere classified: Secondary | ICD-10-CM | POA: Diagnosis not present

## 2020-03-13 DIAGNOSIS — M48062 Spinal stenosis, lumbar region with neurogenic claudication: Secondary | ICD-10-CM | POA: Diagnosis not present

## 2020-03-13 DIAGNOSIS — Z9889 Other specified postprocedural states: Secondary | ICD-10-CM | POA: Diagnosis not present

## 2020-03-21 ENCOUNTER — Telehealth: Payer: Self-pay

## 2020-03-21 ENCOUNTER — Other Ambulatory Visit: Payer: Self-pay

## 2020-03-21 DIAGNOSIS — M7989 Other specified soft tissue disorders: Secondary | ICD-10-CM

## 2020-03-21 NOTE — Telephone Encounter (Signed)
Patient called triage c/o swelling lasting a year in right leg. Initial swelling and hematoma from a fall last year. Hematoma has resolved but swelling remains. Patient denies pain but voices discomfort. She is on Eliquis.Discussed concerns with MD - patient on schedule tomorrow 03/22/2020 for DVT u/s and PA visit.

## 2020-03-22 ENCOUNTER — Ambulatory Visit (INDEPENDENT_AMBULATORY_CARE_PROVIDER_SITE_OTHER): Payer: Medicare Other | Admitting: Physician Assistant

## 2020-03-22 ENCOUNTER — Ambulatory Visit (HOSPITAL_COMMUNITY)
Admission: RE | Admit: 2020-03-22 | Discharge: 2020-03-22 | Disposition: A | Discharge status: Home / Self Care | Payer: Medicare Other | Source: Ambulatory Visit | Attending: Vascular Surgery | Admitting: Vascular Surgery

## 2020-03-22 ENCOUNTER — Other Ambulatory Visit: Payer: Self-pay

## 2020-03-22 VITALS — BP 143/69 | HR 50 | Temp 97.7°F | Resp 20 | Ht 63.0 in | Wt 150.8 lb

## 2020-03-22 DIAGNOSIS — M7989 Other specified soft tissue disorders: Secondary | ICD-10-CM
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
## 2020-03-23 ENCOUNTER — Other Ambulatory Visit: Payer: Self-pay

## 2020-03-23 DIAGNOSIS — M7989 Other specified soft tissue disorders: Secondary | ICD-10-CM

## 2020-04-10 IMAGING — RF DG LUMBAR SPINE 2-3V
1 series · 2 of 2 positions shown · IV contrast (agent unspecified)
Comparison: CT 08/26/2018.

CLINICAL DATA: Posterolateral fusion.

EXAM:
DG C-ARM 1-60 MIN; LUMBAR SPINE - 2-3 VIEW
CONTRAST:  No prior.
FLUOROSCOPY TIME:  Fluoroscopy Time:  0 minutes 35 seconds
Number of Acquired Spot Images: 2

[Series 1: run · 2 of 2 slices shown]
[im 1/2]
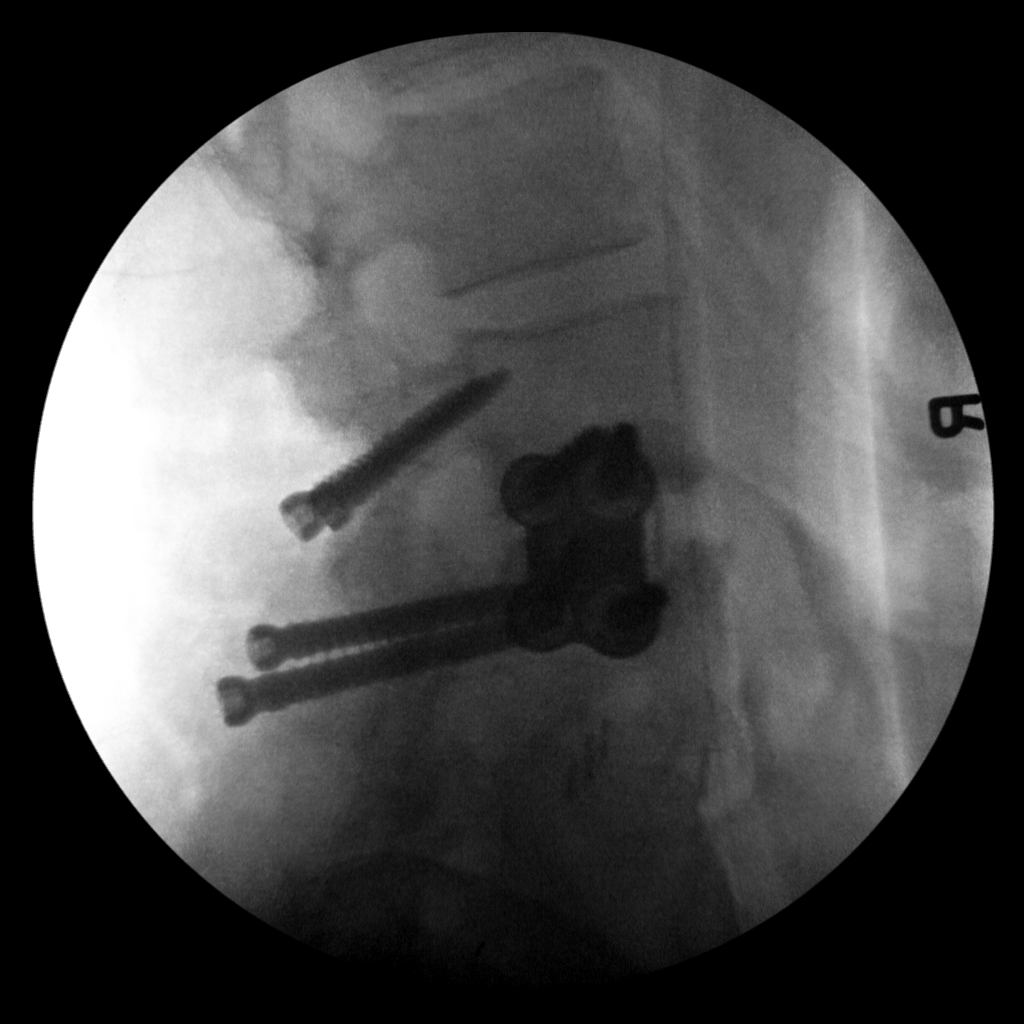
[im 2/2]
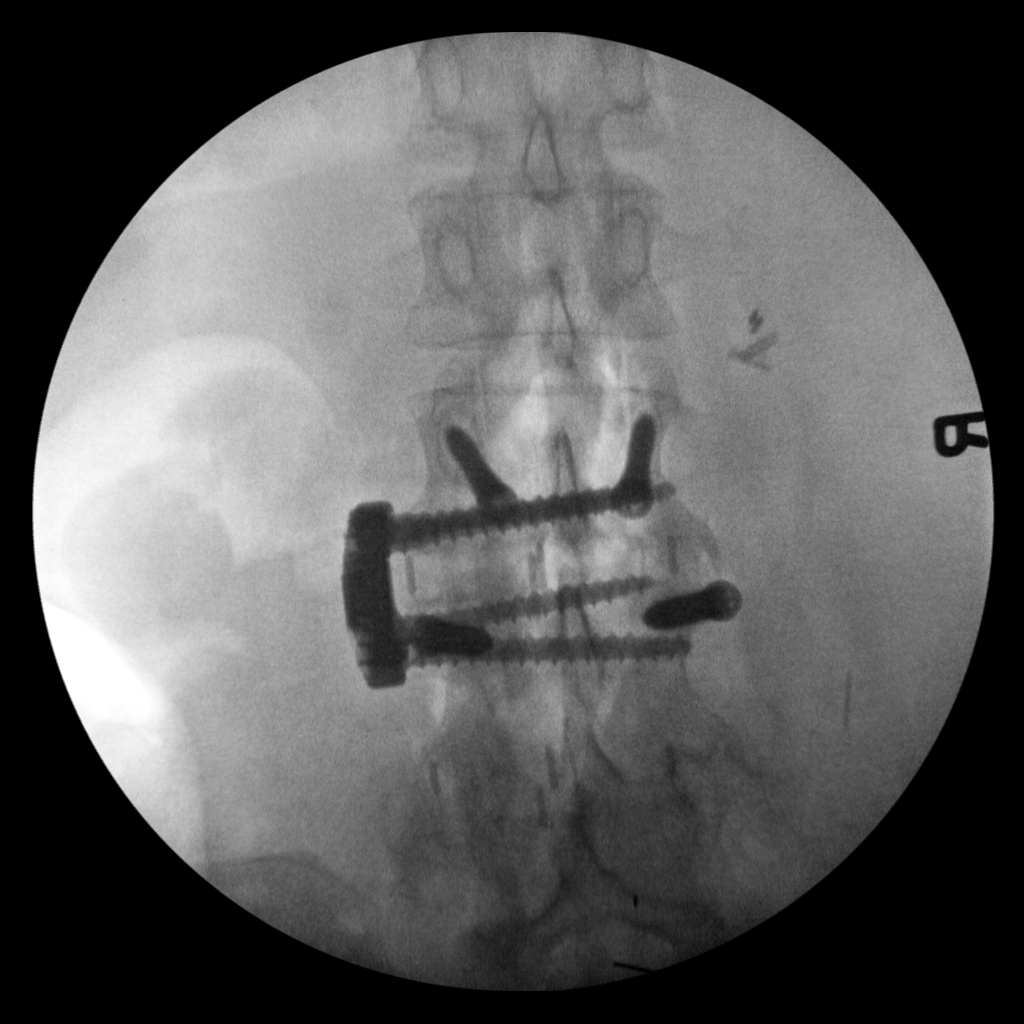

[2 of 2 positions shown; findings below may reference images not displayed]

FINDINGS: Lateral fusion lower lumbar spine. Hardware intact. Posterior
pedicle screws noted and are intact. No acute bony abnormality. Bony
alignment.
IMPRESSION: Postsurgical changes lower lumbar spine. Hardware intact. Bony
alignment.

## 2020-04-10 IMAGING — RF DG C-ARM 1-60 MIN
1 series · 2 of 2 positions shown · IV contrast (agent unspecified)
Comparison: CT 08/26/2018.

CLINICAL DATA: Posterolateral fusion.

EXAM:
DG C-ARM 1-60 MIN; LUMBAR SPINE - 2-3 VIEW
CONTRAST:  No prior.
FLUOROSCOPY TIME:  Fluoroscopy Time:  0 minutes 35 seconds
Number of Acquired Spot Images: 2

[Series 1: run · 2 of 2 slices shown]
[im 1/2]
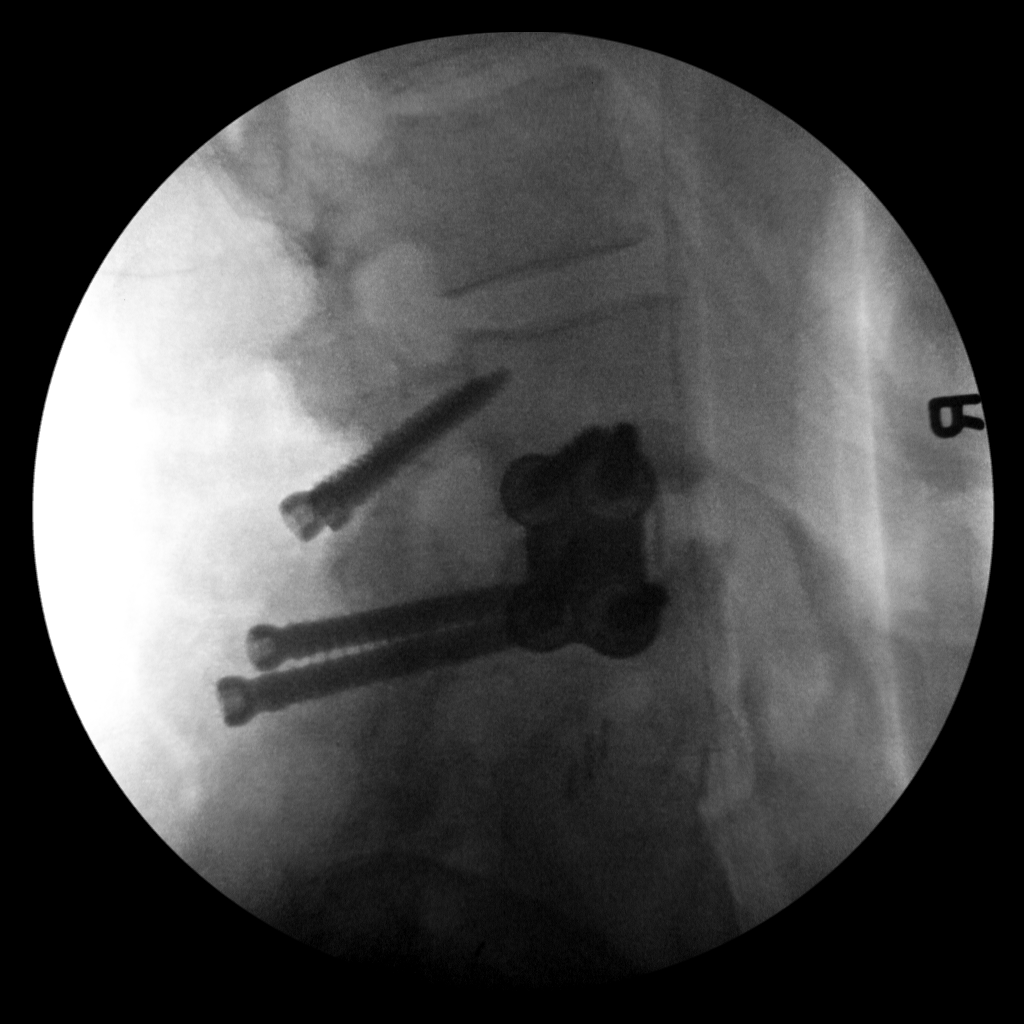
[im 2/2]
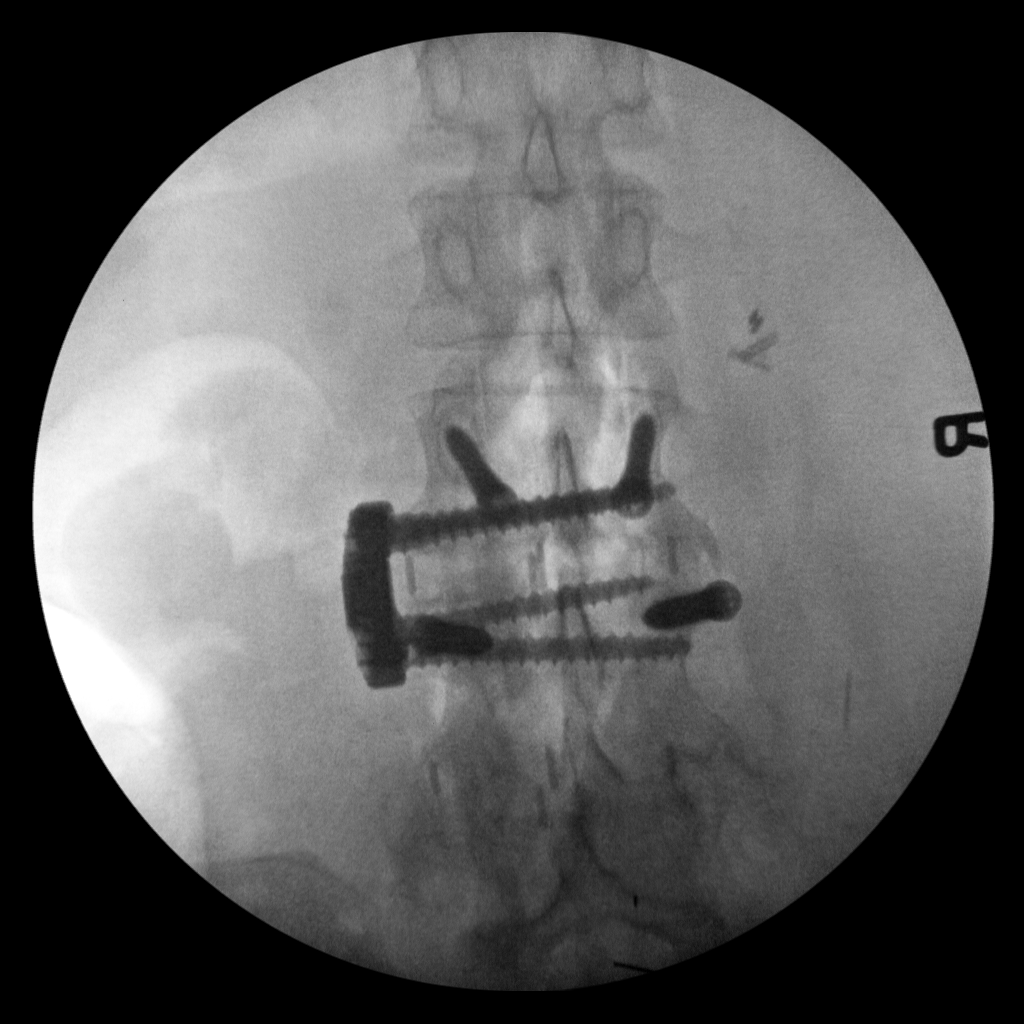

[2 of 2 positions shown; findings below may reference images not displayed]

FINDINGS: Lateral fusion lower lumbar spine. Hardware intact. Posterior
pedicle screws noted and are intact. No acute bony abnormality. Bony
alignment.
IMPRESSION: Postsurgical changes lower lumbar spine. Hardware intact. Bony
alignment.

## 2020-04-12 ENCOUNTER — Ambulatory Visit (HOSPITAL_COMMUNITY)
Admission: RE | Admit: 2020-04-12 | Discharge: 2020-04-12 | Disposition: A | Discharge status: Home / Self Care | Payer: Medicare Other | Source: Ambulatory Visit | Attending: Vascular Surgery | Admitting: Vascular Surgery

## 2020-04-12 ENCOUNTER — Other Ambulatory Visit: Payer: Self-pay

## 2020-04-12 ENCOUNTER — Ambulatory Visit (INDEPENDENT_AMBULATORY_CARE_PROVIDER_SITE_OTHER): Payer: Medicare Other | Admitting: Physician Assistant

## 2020-04-12 ENCOUNTER — Ambulatory Visit (INDEPENDENT_AMBULATORY_CARE_PROVIDER_SITE_OTHER)
Admission: RE | Admit: 2020-04-12 | Discharge: 2020-04-12 | Disposition: A | Discharge status: Home / Self Care | Payer: Medicare Other | Source: Ambulatory Visit | Attending: Vascular Surgery | Admitting: Vascular Surgery

## 2020-04-12 VITALS — BP 125/65 | HR 59 | Temp 97.9°F | Resp 20 | Ht 63.0 in | Wt 149.4 lb

## 2020-04-12 DIAGNOSIS — M7989 Other specified soft tissue disorders: Secondary | ICD-10-CM | POA: Insufficient documentation

## 2020-04-12 DIAGNOSIS — R9430 Abnormal result of cardiovascular function study, unspecified: Secondary | ICD-10-CM | POA: Diagnosis not present
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
## 2020-04-16 ENCOUNTER — Ambulatory Visit (INDEPENDENT_AMBULATORY_CARE_PROVIDER_SITE_OTHER): Payer: Medicare Other

## 2020-04-16 VITALS — Ht 63.0 in | Wt 149.0 lb

## 2020-04-16 DIAGNOSIS — Z0000 Encounter for general adult medical examination without abnormal findings: Secondary | ICD-10-CM | POA: Diagnosis not present

## 2020-04-16 NOTE — Patient Instructions (Signed)
Melissa Jordan , Thank you for taking time to complete your Medicare Wellness Visit. I appreciate your ongoing commitment to your health goals. Please review the following plan we discussed and let me know if I can assist you in the future.   Screening recommendations/referrals: Colonoscopy: Completed 08/09/2015-Not indicated after age 74. Mammogram: Completed 07/18/19-Due 07/17/2020 Bone Density: Completed 10/16/2016- Due- Per our conversation, to be completed with mammogram in January. Recommended yearly ophthalmology/optometry visit for glaucoma screening and checkup Recommended yearly dental visit for hygiene and checkup  Vaccinations: Influenza vaccine: Due- May obtain vaccine at our office or your local pharmacy Pneumococcal vaccine: Prevnar-13 due-May obtain vaccine at our office or your local pharmacy Tdap vaccine: Up to Date- Due-08/21/2021 Shingles vaccine: Declined  Covid-19:Completed vaccines  Advanced directives: Discussed. Please bring a copy for your chart once completed.  Conditions/risks identified: See problem list  Next appointment: Follow up in one year for your annual wellness visit 04/22/2021 @ 9:00am   Preventive Care 65 Years and Older, Female Preventive care refers to lifestyle choices and visits with your health care provider that can promote health and wellness. What does preventive care include?  A yearly physical exam. This is also called an annual well check.  Dental exams once or twice a year.  Routine eye exams. Ask your health care provider how often you should have your eyes checked.  Personal lifestyle choices, including:  Daily care of your teeth and gums.  Regular physical activity.  Eating a healthy diet.  Avoiding tobacco and drug use.  Limiting alcohol use.  Practicing safe sex.  Taking low-dose aspirin every day.  Taking vitamin and mineral supplements as recommended by your health care provider. What happens during an annual well  check? The services and screenings done by your health care provider during your annual well check will depend on your age, overall health, lifestyle risk factors, and family history of disease. Counseling  Your health care provider may ask you questions about your:  Alcohol use.  Tobacco use.  Drug use.  Emotional well-being.  Home and relationship well-being.  Sexual activity.  Eating habits.  History of falls.  Memory and ability to understand (cognition).  Work and work Astronomer.  Reproductive health. Screening  You may have the following tests or measurements:  Height, weight, and BMI.  Blood pressure.  Lipid and cholesterol levels. These may be checked every 5 years, or more frequently if you are over 78 years old.  Skin check.  Lung cancer screening. You may have this screening every year starting at age 26 if you have a 30-pack-year history of smoking and currently smoke or have quit within the past 15 years.  Fecal occult blood test (FOBT) of the stool. You may have this test every year starting at age 24.  Flexible sigmoidoscopy or colonoscopy. You may have a sigmoidoscopy every 5 years or a colonoscopy every 10 years starting at age 53.  Hepatitis C blood test.  Hepatitis B blood test.  Sexually transmitted disease (STD) testing.  Diabetes screening. This is done by checking your blood sugar (glucose) after you have not eaten for a while (fasting). You may have this done every 1-3 years.  Bone density scan. This is done to screen for osteoporosis. You may have this done starting at age 42.  Mammogram. This may be done every 1-2 years. Talk to your health care provider about how often you should have regular mammograms. Talk with your health care provider about your test results,  treatment options, and if necessary, the need for more tests. Vaccines  Your health care provider may recommend certain vaccines, such as:  Influenza vaccine. This is  recommended every year.  Tetanus, diphtheria, and acellular pertussis (Tdap, Td) vaccine. You may need a Td booster every 10 years.  Zoster vaccine. You may need this after age 77.  Pneumococcal 13-valent conjugate (PCV13) vaccine. One dose is recommended after age 21.  Pneumococcal polysaccharide (PPSV23) vaccine. One dose is recommended after age 19. Talk to your health care provider about which screenings and vaccines you need and how often you need them. This information is not intended to replace advice given to you by your health care provider. Make sure you discuss any questions you have with your health care provider. Document Released: 07/06/2015 Document Revised: 02/27/2016 Document Reviewed: 04/10/2015 Elsevier Interactive Patient Education  2017 [place] Prevention in the Home Falls can cause injuries. They can happen to people of all ages. There are many things you can do to make your home safe and to help prevent falls. What can I do on the outside of my home?  Regularly fix the edges of walkways and driveways and fix any cracks.  Remove anything that might make you trip as you walk through a door, such as a raised step or threshold.  Trim any bushes or trees on the path to your home.  Use bright outdoor lighting.  Clear any walking paths of anything that might make someone trip, such as rocks or tools.  Regularly check to see if handrails are loose or broken. Make sure that both sides of any steps have handrails.  Any raised decks and porches should have guardrails on the edges.  Have any leaves, snow, or ice cleared regularly.  Use sand or salt on walking paths during winter.  Clean up any spills in your garage right away. This includes oil or grease spills. What can I do in the bathroom?  Use night lights.  Install grab bars by the toilet and in the tub and shower. Do not use towel bars as grab bars.  Use non-skid mats or decals in the tub or  shower.  If you need to sit down in the shower, use a plastic, non-slip stool.  Keep the floor dry. Clean up any water that spills on the floor as soon as it happens.  Remove soap buildup in the tub or shower regularly.  Attach bath mats securely with double-sided non-slip rug tape.  Do not have throw rugs and other things on the floor that can make you trip. What can I do in the bedroom?  Use night lights.  Make sure that you have a light by your bed that is easy to reach.  Do not use any sheets or blankets that are too big for your bed. They should not hang down onto the floor.  Have a firm chair that has side arms. You can use this for support while you get dressed.  Do not have throw rugs and other things on the floor that can make you trip. What can I do in the kitchen?  Clean up any spills right away.  Avoid walking on wet floors.  Keep items that you use a lot in easy-to-reach places.  If you need to reach something above you, use a strong step stool that has a grab bar.  Keep electrical cords out of the way.  Do not use floor polish or wax that makes floors  slippery. If you must use wax, use non-skid floor wax.  Do not have throw rugs and other things on the floor that can make you trip. What can I do with my stairs?  Do not leave any items on the stairs.  Make sure that there are handrails on both sides of the stairs and use them. Fix handrails that are broken or loose. Make sure that handrails are as long as the stairways.  Check any carpeting to make sure that it is firmly attached to the stairs. Fix any carpet that is loose or worn.  Avoid having throw rugs at the top or bottom of the stairs. If you do have throw rugs, attach them to the floor with carpet tape.  Make sure that you have a light switch at the top of the stairs and the bottom of the stairs. If you do not have them, ask someone to add them for you. What else can I do to help prevent  falls?  Wear shoes that:  Do not have high heels.  Have rubber bottoms.  Are comfortable and fit you well.  Are closed at the toe. Do not wear sandals.  If you use a stepladder:  Make sure that it is fully opened. Do not climb a closed stepladder.  Make sure that both sides of the stepladder are locked into place.  Ask someone to hold it for you, if possible.  Clearly mark and make sure that you can see:  Any grab bars or handrails.  First and last steps.  Where the edge of each step is.  Use tools that help you move around (mobility aids) if they are needed. These include:  Canes.  Walkers.  Scooters.  Crutches.  Turn on the lights when you go into a dark area. Replace any light bulbs as soon as they burn out.  Set up your furniture so you have a clear path. Avoid moving your furniture around.  If any of your floors are uneven, fix them.  If there are any pets around you, be aware of where they are.  Review your medicines with your doctor. Some medicines can make you feel dizzy. This can increase your chance of falling. Ask your doctor what other things that you can do to help prevent falls. This information is not intended to replace advice given to you by your health care provider. Make sure you discuss any questions you have with your health care provider. Document Released: 04/05/2009 Document Revised: 11/15/2015 Document Reviewed: 07/14/2014 Elsevier Interactive Patient Education  2017 [employer].

## 2020-05-08 ENCOUNTER — Encounter: Payer: Self-pay | Admitting: Physician Assistant

## 2020-05-08 ENCOUNTER — Telehealth (INDEPENDENT_AMBULATORY_CARE_PROVIDER_SITE_OTHER): Payer: Medicare Other | Admitting: Physician Assistant

## 2020-05-08 ENCOUNTER — Telehealth: Payer: Self-pay | Admitting: Family Medicine

## 2020-05-08 DIAGNOSIS — J0100 Acute maxillary sinusitis, unspecified: Secondary | ICD-10-CM

## 2020-05-08 MED ORDER — DOXYCYCLINE HYCLATE 100 MG PO CAPS: 100.0000 mg | ORAL_CAPSULE | Freq: Two times a day (BID) | ORAL | 0 refills | Status: DC

## 2020-05-08 MED ORDER — BENZONATATE 100 MG PO CAPS: 100.0000 mg | ORAL_CAPSULE | Freq: Three times a day (TID) | ORAL | 0 refills | Status: DC | PRN

## 2020-05-08 NOTE — Progress Notes (Signed)
Virtual Visit via Telephone Note  I connected with Melissa Jordan on 05/08/20 at 11:00 AM EST by telephone and verified that I am speaking with the correct person using two identifiers.  Location: Patient: Home Provider: [facility]   I discussed the limitations, risks, security and privacy concerns of performing an evaluation and management service by telephone and the availability of in person appointments. I also discussed with the patient that there may be a patient responsible charge related to this service. The patient expressed understanding and agreed to proceed.   History of Present Illness: > 1.5 weeks of sinus pressure, sinus pain with ear pressure and pain. Endorses significant sinus headache. Denies fever, chills, or aches. Notes R ear pain starting yesterday. Also with significant tooth pain. Denies chest pain or SOB. Denies loss of taste or smell. Denies recent travel or sick contact. Has had COVID vaccines. Has been using her Flonase and Zyrtec daily.    Observations/Objective: No labored breathing.  Speech is clear and coherent with logical content.  Patient is alert and oriented at baseline.   Assessment and Plan: 1. Acute non-recurrent maxillary sinusitis Rx Doxycycline.  Increase fluids.  Rest.  Saline nasal spray.  Probiotic.  Mucinex as directed.  Humidifier in bedroom. Tessalon per orders. Continue Flonase and Zyrtec.  Call or return to clinic if symptoms are not improving.  - benzonatate (TESSALON) 100 MG capsule; Take 1 capsule (100 mg total) by mouth 3 (three) times daily as needed.  Dispense: 30 capsule; Refill: 0 - doxycycline (VIBRAMYCIN) 100 MG capsule; Take 1 capsule (100 mg total) by mouth 2 (two) times daily.  Dispense: 20 capsule; Refill: 0   Follow Up Instructions:  I discussed the assessment and treatment plan with the patient. The patient was provided an opportunity to ask questions and all were answered. The patient agreed with the plan and  demonstrated an understanding of the instructions.   The patient was advised to call back or seek an in-person evaluation if the symptoms worsen or if the condition fails to improve as anticipated.  I provided 10 minutes of non-face-to-face time during this encounter.   Piedad Climes, PA-C

## 2020-05-08 NOTE — Patient Instructions (Signed)
Instructions sent to patients MyChart.

## 2020-05-08 NOTE — Progress Notes (Signed)
  Chronic Care Management   Note  05/08/2020 Name: Melissa Jordan MRN: [id number] DOB: 10/02/1945  Melissa Jordan is a 74 y.o. year old female who is a primary care patient of Beverely Low, Helane Rima, MD. I reached out to Tedra Senegal by phone today in response to a referral sent by Melissa Jordan's PCP, Sheliah Hatch, MD.   Melissa Jordan was given information about Chronic Care Management services today including:  1. CCM service includes personalized support from designated clinical staff supervised by her physician, including individualized plan of care and coordination with other care providers 2. 24/7 contact phone numbers for assistance for urgent and routine care needs. 3. Service will only be billed when office clinical staff spend 20 minutes or more in a month to coordinate care. 4. Only one practitioner may furnish and bill the service in a calendar month. 5. The patient may stop CCM services at any time (effective at the end of the month) by phone call to the office staff.   Patient agreed to services and verbal consent obtained.   Follow up plan:   Carmell Austria [employer] Scheduler

## 2020-05-09 ENCOUNTER — Other Ambulatory Visit: Payer: Self-pay | Admitting: Family Medicine

## 2020-05-14 DIAGNOSIS — F1120 Opioid dependence, uncomplicated: Secondary | ICD-10-CM | POA: Insufficient documentation

## 2020-05-14 DIAGNOSIS — Z9889 Other specified postprocedural states: Secondary | ICD-10-CM | POA: Diagnosis not present

## 2020-05-14 DIAGNOSIS — M461 Sacroiliitis, not elsewhere classified: Secondary | ICD-10-CM | POA: Diagnosis not present

## 2020-05-14 DIAGNOSIS — M47816 Spondylosis without myelopathy or radiculopathy, lumbar region: Secondary | ICD-10-CM | POA: Diagnosis not present

## 2020-05-21 ENCOUNTER — Other Ambulatory Visit: Payer: Self-pay

## 2020-05-21 ENCOUNTER — Encounter: Payer: Self-pay | Admitting: Orthopedic Surgery

## 2020-05-21 ENCOUNTER — Other Ambulatory Visit: Payer: Self-pay | Admitting: Surgical

## 2020-05-21 MED ORDER — DOXYCYCLINE HYCLATE 50 MG PO CAPS: 100.0000 mg | ORAL_CAPSULE | Freq: Once | ORAL | 0 refills | Status: AC

## 2020-05-21 NOTE — Progress Notes (Signed)
Patient request rx for dental procedure and has allergy to PCN. Please advise. Thanks.

## 2020-05-21 NOTE — Telephone Encounter (Signed)
Ok to come in next week thx

## 2020-05-21 NOTE — Progress Notes (Signed)
Sent in RX for doxycycline

## 2020-05-23 ENCOUNTER — Encounter: Payer: Self-pay | Admitting: Family Medicine

## 2020-05-23 ENCOUNTER — Telehealth (INDEPENDENT_AMBULATORY_CARE_PROVIDER_SITE_OTHER): Payer: Medicare Other | Admitting: Family Medicine

## 2020-05-23 ENCOUNTER — Other Ambulatory Visit: Payer: Self-pay | Admitting: Family Medicine

## 2020-05-23 VITALS — BP 118/62 | HR 58 | Temp 99.4°F

## 2020-05-23 DIAGNOSIS — J329 Chronic sinusitis, unspecified: Secondary | ICD-10-CM

## 2020-05-23 DIAGNOSIS — B9689 Other specified bacterial agents as the cause of diseases classified elsewhere: Secondary | ICD-10-CM | POA: Diagnosis not present

## 2020-05-23 MED ORDER — CEFDINIR 300 MG PO CAPS: 300.0000 mg | ORAL_CAPSULE | Freq: Two times a day (BID) | ORAL | 0 refills | Status: DC

## 2020-05-23 NOTE — Progress Notes (Signed)
Virtual Visit via Video   I connected with patient on 05/23/20 at 11:30 AM EST by a video enabled telemedicine application and verified that I am speaking with the correct person using two identifiers.  Location patient: Home Location provider: [facility] Persons participating in the virtual visit: Patient, Provider, CMA (Sabrina M)  I discussed the limitations of evaluation and management by telemedicine and the availability of in person appointments. The patient expressed understanding and agreed to proceed.  Subjective:   HPI:   URI- 'other than my ear hurting, my face hurting, and my sinuses- i'm ok'.  Sxs had improved w/ Doxycycline in mid-November but then started 'just a couple of days later'.  Taking Zyrtec daily and using Flonase daily.  No documented fever, Tm 99.4.  No known sick contacts.  ROS:   See pertinent positives and negatives per HPI.  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   Traumatic arthritis of right ankle    Pain in right ankle and joints of right foot 08/05/2018   A-fib (HCC) 03/10/2017   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/2016   RLS (restless legs syndrome) 01/11/2015   Osteopenia 05/15/2014   Hyperlipidemia 05/24/2013   Routine general medical examination at a health care facility 03/29/2012   POSTMENOPAUSAL SYNDROME 09/26/2009   URINARY URGENCY 09/26/2009   ANEMIA 01/09/2009   Back pain of lumbar region with sciatica 01/09/2009   LAMINECTOMY, LUMBAR, HX OF 08/09/2008   Hypothyroid 03/03/2007   HYPERTENSION, BENIGN 03/03/2007   GERD 02/10/2007    Social History   Tobacco Use   Smoking status: Never Smoker   Smokeless tobacco: Never Used  Substance Use Topics   Alcohol use: No    Current Outpatient Medications:    acetaminophen (TYLENOL) 500 MG tablet, Take 1,000 mg by mouth every 6 (six) hours  as needed for moderate pain. , Disp: , Rfl:    apixaban (ELIQUIS) 5 MG TABS tablet, Take 1 tablet (5 mg total) by mouth 2 (two) times daily., Disp: 180 tablet, Rfl: 3   benzonatate (TESSALON) 100 MG capsule, Take 1 capsule (100 mg total) by mouth 3 (three) times daily as needed., Disp: 30 capsule, Rfl: 0   Biotin 5000 MCG CAPS, Take 5,000 mcg by mouth daily., Disp: , Rfl:    CALCIUM-VITAMIN D PO, Take 1 tablet by mouth daily. , Disp: , Rfl:    cholecalciferol (VITAMIN D) 1000 units tablet, Take 1,000 Units by mouth daily., Disp: , Rfl:    estradiol (ESTRACE) 1 MG tablet, Take 1 tablet by mouth once daily, Disp: 90 tablet, Rfl: 0   fluticasone (FLONASE) 50 MCG/ACT nasal spray, Place 2 sprays into both nostrils daily. , Disp: , Rfl:    hydroxypropyl methylcellulose / hypromellose (ISOPTO TEARS / GONIOVISC) 2.5 % ophthalmic solution, Place 1 drop into both eyes daily., Disp: , Rfl:    levothyroxine (SYNTHROID) 75 MCG tablet, TAKE 1 TABLET BY MOUTH  DAILY (Patient taking differently: Take 75 mcg by mouth daily before breakfast. ), Disp: 90 tablet, Rfl: 3   losartan (COZAAR) 50 MG tablet, Take 50 mg by mouth daily., Disp: , Rfl:    magnesium oxide (MAG-OX) 400 MG tablet, Take 400 mg by mouth daily., Disp: , Rfl:    metoprolol tartrate (LOPRESSOR) 25 MG tablet, TAKE 1 TABLET BY MOUTH  TWICE DAILY, Disp: 180 tablet, Rfl: 3   rOPINIRole (REQUIP) 1 MG  tablet, Take 1 tablet (1 mg total) by mouth at bedtime., Disp: 90 tablet, Rfl: 3   traMADol (ULTRAM) 50 MG tablet, SMARTSIG:1 Tablet(s) By Mouth Every 12 Hours PRN, Disp: , Rfl:   Allergies  Allergen Reactions   Lyrica [Pregabalin] Palpitations and Other (See Comments)    "thought I was going to have a heart attack; heart started pounding so hard, I couldn't catch my breath" (06/28/2012)   Meclizine Other (See Comments)    "what was in my mind to say wasn't what came out to say; I was kind of in another world" (06/28/2012)   Penicillins  Itching and Rash    Has patient had a PCN reaction causing immediate rash, facial/tongue/throat swelling, SOB or lightheadedness with hypotension: No Has patient had a PCN reaction causing severe rash involving mucus membranes or skin necrosis: No Has patient had a PCN reaction that required hospitalization No Has patient had a PCN reaction occurring within the last 10 years:   # # YES # #  If all of the above answers are "NO", then may proceed with Cephalosporin use.    Poison Sumac Extract Swelling, Rash and Other (See Comments)    Blisters   Bactrim [Sulfamethoxazole-Trimethoprim] Rash   Codeine Nausea Only   Morphine Sulfate Rash   Propoxyphene N-Acetaminophen Nausea And Vomiting    "Darvocet"    Objective:   BP 118/62    Pulse (!) 58    Temp 99.4 F (37.4 C) (Temporal)  AAOx3, NAD NCAT, EOMI No obvious CN deficits Coloring WNL Pt is able to speak clearly, coherently without shortness of breath or increased work of breathing.  Thought process is linear.  Mood is appropriate.   Assessment and Plan:   Recurrent bacterial sinusitis- pt was treated w/ Doxy last month which improved her sxs temporarily but they returned shortly after.  Will start Omnicef due to PCN and bactrim allergies.  Pt has tolerated cephalosporins in the past w/o difficulty.  Reviewed supportive care and red flags that should prompt return.  Pt expressed understanding and is in agreement w/ plan.    Neena Rhymes, MD 05/23/2020

## 2020-05-23 NOTE — Progress Notes (Signed)
I connected with  Melissa Jordan on 05/23/20 by a video enabled telemedicine application and verified that I am speaking with the correct person using two identifiers.   I discussed the limitations of evaluation and management by telemedicine. The patient expressed understanding and agreed to proceed.

## 2020-06-04 ENCOUNTER — Other Ambulatory Visit: Payer: Self-pay

## 2020-06-04 ENCOUNTER — Encounter: Payer: Self-pay | Admitting: Orthopedic Surgery

## 2020-06-04 ENCOUNTER — Ambulatory Visit: Payer: Self-pay

## 2020-06-04 ENCOUNTER — Ambulatory Visit (INDEPENDENT_AMBULATORY_CARE_PROVIDER_SITE_OTHER): Payer: Medicare Other | Admitting: Orthopedic Surgery

## 2020-06-04 DIAGNOSIS — M25561 Pain in right knee: Secondary | ICD-10-CM

## 2020-06-04 DIAGNOSIS — Z96651 Presence of right artificial knee joint: Secondary | ICD-10-CM | POA: Diagnosis not present
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
## 2020-06-05 ENCOUNTER — Encounter: Payer: Self-pay | Admitting: Orthopedic Surgery
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
## 2020-06-07 ENCOUNTER — Ambulatory Visit: Payer: Medicare Other

## 2020-06-07 DIAGNOSIS — I10 Essential (primary) hypertension: Secondary | ICD-10-CM

## 2020-06-07 DIAGNOSIS — I4891 Unspecified atrial fibrillation: Secondary | ICD-10-CM

## 2020-06-07 DIAGNOSIS — E785 Hyperlipidemia, unspecified: Secondary | ICD-10-CM

## 2020-06-12 ENCOUNTER — Telehealth: Payer: Self-pay

## 2020-06-12 NOTE — Progress Notes (Signed)
Chronic Care Management Pharmacy Assistant   Name: Melissa Jordan  MRN: [id number] DOB: February 09, 1946  Reason for Encounter: Patient assistance application  PCP : Sheliah Hatch, MD  Allergies:   Allergies  Allergen Reactions  . Lyrica [Pregabalin] Palpitations and Other (See Comments)    "thought I was going to have a heart attack; heart started pounding so hard, I couldn't catch my breath" (06/28/2012)  . Meclizine Other (See Comments)    "what was in my mind to say wasn't what came out to say; I was kind of in another world" (06/28/2012)  . Penicillins Itching and Rash    Has patient had a PCN reaction causing immediate rash, facial/tongue/throat swelling, SOB or lightheadedness with hypotension: No Has patient had a PCN reaction causing severe rash involving mucus membranes or skin necrosis: No Has patient had a PCN reaction that required hospitalization No Has patient had a PCN reaction occurring within the last 10 years:   # # YES # #  If all of the above answers are "NO", then may proceed with Cephalosporin use.   . Poison Sumac Extract Swelling, Rash and Other (See Comments)    Blisters  . Bactrim [Sulfamethoxazole-Trimethoprim] Rash  . Codeine Nausea Only  . Morphine Sulfate Rash  . Propoxyphene N-Acetaminophen Nausea And Vomiting    "Darvocet"    Medications: Outpatient Encounter Medications as of 06/12/2020  Medication Sig  . acetaminophen (TYLENOL) 500 MG tablet Take 1,000 mg by mouth every 6 (six) hours as needed for moderate pain.   Marland Kitchen apixaban (ELIQUIS) 5 MG TABS tablet Take 1 tablet (5 mg total) by mouth 2 (two) times daily.  . Biotin 5000 MCG CAPS Take 5,000 mcg by mouth daily.  Marland Kitchen CALCIUM-VITAMIN D PO Take 1 tablet by mouth daily.   . cholecalciferol (VITAMIN D) 1000 units tablet Take 1,000 Units by mouth daily.  Marland Kitchen estradiol (ESTRACE) 1 MG tablet Take 1 tablet by mouth once daily  . fluticasone (FLONASE) 50 MCG/ACT nasal spray Place 2 sprays into both  nostrils daily.  . hydroxypropyl methylcellulose / hypromellose (ISOPTO TEARS / GONIOVISC) 2.5 % ophthalmic solution Place 1 drop into both eyes daily.  Marland Kitchen levothyroxine (SYNTHROID) 75 MCG tablet TAKE 1 TABLET BY MOUTH  DAILY (Patient taking differently: Take 75 mcg by mouth daily before breakfast.)  . losartan (COZAAR) 50 MG tablet Take 50 mg by mouth daily.  . magnesium oxide (MAG-OX) 400 MG tablet Take 400 mg by mouth daily.  . metoprolol tartrate (LOPRESSOR) 25 MG tablet TAKE 1 TABLET BY MOUTH  TWICE DAILY  . rOPINIRole (REQUIP) 1 MG tablet Take 1 tablet (1 mg total) by mouth at bedtime.  . traMADol (ULTRAM) 50 MG tablet SMARTSIG:1 Tablet(s) By Mouth Every 12 Hours PRN   No facility-administered encounter medications on file as of 06/12/2020.    Current Diagnosis: Patient Active Problem List   Diagnosis Date Noted  . 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 Traumatic arthritis of right ankle   . Pain in right ankle and joints of right foot 08/05/2018  . A-fib ([facility]) 03/10/2017  . 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 RLS (restless legs syndrome) 01/11/2015  . Osteopenia 05/15/2014  . Hyperlipidemia 05/24/2013  . Routine general medical examination at a health care facility 03/29/2012  . POSTMENOPAUSAL SYNDROME 09/26/2009  . URINARY URGENCY 09/26/2009  . ANEMIA 01/09/2009  . Back pain of lumbar region with sciatica 01/09/2009  . LAMINECTOMY,  LUMBAR, HX OF 08/09/2008  . Hypothyroid 03/03/2007  . HYPERTENSION, BENIGN 03/03/2007  . GERD 02/10/2007    Spoke with patient and informed patient I will be mailing patient assistance application for Eliquis. Informed patient once completed to return to office  Aloha Gell ,[facility] Clinical Pharmacist Assistant [phone]  Follow-Up:  Pharmacist Review

## 2020-06-28 DIAGNOSIS — M5416 Radiculopathy, lumbar region: Secondary | ICD-10-CM | POA: Diagnosis not present

## 2020-06-28 DIAGNOSIS — M5440 Lumbago with sciatica, unspecified side: Secondary | ICD-10-CM | POA: Diagnosis not present

## 2020-07-04 ENCOUNTER — Other Ambulatory Visit: Payer: Self-pay | Admitting: Cardiology

## 2020-07-04 NOTE — Telephone Encounter (Signed)
69f, 67.8kg, scr 1.00 01/31/20, lovw/barrett 10/14/19

## 2020-07-19 ENCOUNTER — Other Ambulatory Visit: Payer: Self-pay | Admitting: Student

## 2020-07-19 ENCOUNTER — Telehealth: Payer: Self-pay

## 2020-07-19 DIAGNOSIS — M25551 Pain in right hip: Secondary | ICD-10-CM | POA: Diagnosis not present

## 2020-07-19 DIAGNOSIS — M5416 Radiculopathy, lumbar region: Secondary | ICD-10-CM | POA: Diagnosis not present

## 2020-07-19 NOTE — Telephone Encounter (Signed)
Phone call to patient to verify medication list and allergies for myelogram procedure. Pt instructed to hold Tramadol for 48hrs prior to myelogram appointment time and 24 hours after appointment. Pt also instructed to have a driver the day of the procedure, the procedure would take around 2 hours, and discharge instructions discussed. Pt verbalized understanding.  

## 2020-07-25 ENCOUNTER — Other Ambulatory Visit: Payer: Self-pay | Admitting: Student

## 2020-07-25 ENCOUNTER — Other Ambulatory Visit (HOSPITAL_COMMUNITY): Payer: Self-pay | Admitting: Student

## 2020-07-25 DIAGNOSIS — M5416 Radiculopathy, lumbar region: Secondary | ICD-10-CM

## 2020-07-26 NOTE — Progress Notes (Signed)
I called Nedra Hai, the MRI coordinator at [facility].  I left a voice message for Nedra Hai asking for a H/P with vital signs, that is within the last 30 days.

## 2020-07-27 ENCOUNTER — Other Ambulatory Visit: Payer: Medicare Other

## 2020-07-27 ENCOUNTER — Inpatient Hospital Stay
Admission: RE | Admit: 2020-07-27 | Discharge: 2020-07-27 | Disposition: A | Discharge status: Home / Self Care | Payer: Medicare Other | Source: Ambulatory Visit | Attending: Student | Admitting: Student

## 2020-07-27 ENCOUNTER — Telehealth: Payer: Self-pay

## 2020-07-27 NOTE — Discharge Instructions (Signed)
Myelogram Discharge Instructions  1. Go home and rest quietly for the next 24 hours.  It is important to lie flat for the next 24 hours.  Get up only to go to the restroom.  You may lie in the bed or on a couch on your back, your stomach, your left side or your right side.  You may have one pillow under your head.  You may have pillows between your knees while you are on your side or under your knees while you are on your back.  2. DO NOT drive today.  Recline the seat as far back as it will go, while still wearing your seat belt, on the way home.  3. You may get up to go to the bathroom as needed.  You may sit up for 10 minutes to eat.  You may resume your normal diet and medications unless otherwise indicated.  Drink lots of extra fluids today and tomorrow.  4. The incidence of headache, nausea, or vomiting is about 5% (one in 20 patients).  If you develop a headache, lie flat and drink plenty of fluids until the headache goes away.  Caffeinated beverages may be helpful.  If you develop severe nausea and vomiting or a headache that does not go away with flat bed rest, call [phone].  5. You may resume normal activities after your 24 hours of bed rest is over; however, do not exert yourself strongly or do any heavy lifting tomorrow. If when you get up you have a headache when standing, go back to bed and force fluids for another 24 hours.  6. Call your physician for a follow-up appointment.  The results of your myelogram will be sent directly to your physician by the following day.  7. If you have any questions or if complications develop after you arrive home, please call [phone].  Discharge instructions have been explained to the patient.  The patient, or the person responsible for the patient, fully understands these instructions  YOU MAY TAKE YOUR TRAMADOL ON 07/28/20 @ 1PM OR THEREAFTER.

## 2020-07-27 NOTE — Chronic Care Management (AMB) (Signed)
Chronic Care Management Pharmacy Assistant   Name: Melissa Jordan  MRN: [id number] DOB: 1945/12/30  Reason for Encounter: Disease State / Hypertension Adherence Call  Patient Questions:  1.  Have you seen any other providers since your last visit? Yes, 07/11/2020 [facility] Carol Ada, PhD  2.  Any changes in your medicines or health? No  PCP : Sheliah Hatch, MD  Allergies:   Allergies  Allergen Reactions  . Lyrica [Pregabalin] Palpitations and Other (See Comments)    "thought I was going to have a heart attack; heart started pounding so hard, I couldn't catch my breath" (06/28/2012)  . Meclizine Other (See Comments)    "what was in my mind to say wasn't what came out to say; I was kind of in another world" (06/28/2012)  . Penicillins Itching and Rash    Has patient had a PCN reaction causing immediate rash, facial/tongue/throat swelling, SOB or lightheadedness with hypotension: No Has patient had a PCN reaction causing severe rash involving mucus membranes or skin necrosis: No Has patient had a PCN reaction that required hospitalization No Has patient had a PCN reaction occurring within the last 10 years:   # # YES # #  If all of the above answers are "NO", then may proceed with Cephalosporin use.   . Poison Sumac Extract Swelling, Rash and Other (See Comments)    Blisters  . Bactrim [Sulfamethoxazole-Trimethoprim] Rash  . Codeine Nausea Only  . Morphine Sulfate Rash  . Propoxyphene N-Acetaminophen Nausea And Vomiting    "Darvocet"    Medications: Outpatient Encounter Medications as of 07/27/2020  Medication Sig  . acetaminophen (TYLENOL) 500 MG tablet Take 1,000 mg by mouth every 6 (six) hours as needed for moderate pain.   . Biotin 78295 MCG TABS Take 10,000 mcg by mouth daily.  Marland Kitchen CALCIUM-VITAMIN D PO Take 1 tablet by mouth daily.   . Cholecalciferol (VITAMIN D) 125 MCG (5000 UT) CAPS Take 5,000 Units by mouth daily.  Marland Kitchen ELIQUIS 5 MG TABS tablet  Take 1 tablet by mouth twice daily (Patient taking differently: Take 5 mg by mouth 2 (two) times daily.)  . estradiol (ESTRACE) 1 MG tablet Take 1 tablet by mouth once daily (Patient taking differently: Take 1 mg by mouth daily.)  . fluticasone (FLONASE) 50 MCG/ACT nasal spray Place 2 sprays into both nostrils daily as needed for rhinitis or allergies.  Marland Kitchen levothyroxine (SYNTHROID) 75 MCG tablet TAKE 1 TABLET BY MOUTH  DAILY (Patient taking differently: Take 75 mcg by mouth daily before breakfast.)  . losartan (COZAAR) 50 MG tablet Take 50 mg by mouth daily.  . magnesium oxide (MAG-OX) 400 MG tablet Take 400 mg by mouth daily.  . metoprolol tartrate (LOPRESSOR) 25 MG tablet TAKE 1 TABLET BY MOUTH  TWICE DAILY (Patient taking differently: Take 25 mg by mouth 2 (two) times daily.)  . Polyethyl Glycol-Propyl Glycol (SYSTANE) 0.4-0.3 % SOLN Place 1 drop into both eyes daily.  Marland Kitchen rOPINIRole (REQUIP) 1 MG tablet Take 1 tablet (1 mg total) by mouth at bedtime.  . traMADol (ULTRAM) 50 MG tablet Take 50 mg by mouth every 6 (six) hours as needed for moderate pain or severe pain.   No facility-administered encounter medications on file as of 07/27/2020.    Current Diagnosis: Patient Active Problem List   Diagnosis Date Noted  . 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 Traumatic arthritis of right ankle   .  Pain in right ankle and joints of right foot 08/05/2018  . A-fib (HCC) 03/10/2017  . 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 RLS (restless legs syndrome) 01/11/2015  . Osteopenia 05/15/2014  . Hyperlipidemia 05/24/2013  . Routine general medical examination at a health care facility 03/29/2012  . POSTMENOPAUSAL SYNDROME 09/26/2009  . URINARY URGENCY 09/26/2009  . ANEMIA 01/09/2009  . Back pain of lumbar region with sciatica 01/09/2009  . LAMINECTOMY, LUMBAR, HX OF 08/09/2008  . Hypothyroid 03/03/2007  . HYPERTENSION, BENIGN 03/03/2007  .  GERD 02/10/2007    Reviewed chart prior to disease state call. Spoke with patient regarding BP  Recent Office Vitals: BP Readings from Last 3 Encounters:  05/23/20 118/62  04/12/20 125/65  03/22/20 (!) 143/69   Pulse Readings from Last 3 Encounters:  05/23/20 (!) 58  04/12/20 (!) 59  03/22/20 (!) 50    Wt Readings from Last 3 Encounters:  04/16/20 149 lb (67.6 kg)  04/12/20 149 lb 6.4 oz (67.8 kg)  03/22/20 150 lb 12.8 oz (68.4 kg)     Kidney Function Lab Results  Component Value Date/Time   CREATININE 1.00 01/31/2020 09:27 AM   CREATININE 0.99 10/08/2019 11:44 AM   GFR 54.19 (L) 01/31/2020 09:27 AM   GFRNONAA 57 (L) 10/08/2019 11:44 AM   GFRAA >60 10/08/2019 11:44 AM    BMP Latest Ref Rng & Units 01/31/2020 10/08/2019 09/30/2019  Glucose 70 - 99 mg/dL 95(J) 98 884(Z)  BUN 6 - 23 mg/dL 66(A) 15 12  Creatinine 0.40 - 1.20 mg/dL 6.30 1.60 1.09  Sodium 135 - 145 mEq/L 140 138 138  Potassium 3.5 - 5.1 mEq/L 4.0 4.1 3.8  Chloride 96 - 112 mEq/L 103 103 104  CO2 19 - 32 mEq/L 30 27 24   Calcium 8.4 - 10.5 mg/dL 9.2 ) 8.3(L)    . Current antihypertensive regimen:  o Losartan 50 mg tablet daily o Metoprolol 25 mg tablet twice daily (rate control in atrial fibrillation, some BP-lowering)  . How often are you checking your Blood Pressure? 1-2x per week   . Current home BP readings: 126/70  . What recent interventions/DTPs have been made by any provider to improve Blood Pressure control since last CPP Visit: Patient states she is currently taking her medications as directed.  . Any recent hospitalizations or ED visits since last visit with CPP? No , patient has not had any recent hospitalizations or ED visits since her last visit with 3.2(T, CPP  . What diet changes have been made to improve Blood Pressure Control?  o Patient states she has maintained her weight for years. Patient states she doesn't follow a specific diet, she does what works best for her.  . What  exercise is being done to improve your Blood Pressure Control?  o Patient states she doesn't follow an exercise routine, she states she stays busy all day throughout the day and is very active.  Adherence Review: Is the patient currently on ACE/ARB medication? Yes Does the patient have >5 day gap between last estimated fill dates? No  Patient states she feels as if her Ropinirole needs to be increased for her restless legs syndrome. Patient states she is going to speak with Dr. Dahlia Byes about possibly having this medication increased. Patient states her RLS isn't very well controlled at this time.  April D Calhoun, [facility] Clinical Pharmacist Assistant [phone]   Follow-Up:  Pharmacist Review

## 2020-07-30 ENCOUNTER — Ambulatory Visit (HOSPITAL_COMMUNITY)
Admission: RE | Admit: 2020-07-30 | Discharge: 2020-07-30 | Disposition: A | Discharge status: Home / Self Care | Payer: Medicare Other | Source: Ambulatory Visit | Attending: Student | Admitting: Student

## 2020-07-30 DIAGNOSIS — I480 Paroxysmal atrial fibrillation: Secondary | ICD-10-CM | POA: Diagnosis not present

## 2020-07-30 DIAGNOSIS — Z20822 Contact with and (suspected) exposure to covid-19: Secondary | ICD-10-CM | POA: Diagnosis not present

## 2020-07-30 DIAGNOSIS — E039 Hypothyroidism, unspecified: Secondary | ICD-10-CM | POA: Diagnosis not present

## 2020-07-30 DIAGNOSIS — I10 Essential (primary) hypertension: Secondary | ICD-10-CM | POA: Diagnosis not present

## 2020-07-30 DIAGNOSIS — M545 Low back pain, unspecified: Secondary | ICD-10-CM | POA: Diagnosis not present

## 2020-07-30 DIAGNOSIS — G8929 Other chronic pain: Secondary | ICD-10-CM | POA: Diagnosis not present

## 2020-07-30 DIAGNOSIS — Z79890 Hormone replacement therapy (postmenopausal): Secondary | ICD-10-CM | POA: Diagnosis not present

## 2020-07-30 DIAGNOSIS — M5416 Radiculopathy, lumbar region: Secondary | ICD-10-CM | POA: Diagnosis present

## 2020-07-30 DIAGNOSIS — Z79899 Other long term (current) drug therapy: Secondary | ICD-10-CM | POA: Diagnosis not present

## 2020-07-30 DIAGNOSIS — Z96652 Presence of left artificial knee joint: Secondary | ICD-10-CM | POA: Diagnosis not present

## 2020-07-30 DIAGNOSIS — Z79891 Long term (current) use of opiate analgesic: Secondary | ICD-10-CM | POA: Diagnosis not present

## 2020-07-30 DIAGNOSIS — M4726 Other spondylosis with radiculopathy, lumbar region: Secondary | ICD-10-CM | POA: Diagnosis not present

## 2020-07-30 DIAGNOSIS — Z7901 Long term (current) use of anticoagulants: Secondary | ICD-10-CM | POA: Diagnosis not present

## 2020-07-30 DIAGNOSIS — M48061 Spinal stenosis, lumbar region without neurogenic claudication: Secondary | ICD-10-CM | POA: Diagnosis not present

## 2020-07-30 DIAGNOSIS — Z981 Arthrodesis status: Secondary | ICD-10-CM | POA: Diagnosis not present

## 2020-07-30 LAB — SARS CORONAVIRUS 2 (TAT 6-24 HRS): SARS Coronavirus 2: NEGATIVE

## 2020-07-31 NOTE — Progress Notes (Signed)
EKG:  10/11/19 CXR:  01/11/18 ECHO: 05/04/20 Stress Test:  Denies Cardiac Cath:  Denies  Fasting Blood Sugar-  N/A Checks Blood Sugar_N/A__ times a day  OSA/CPAP:  No  ASA:  No Blood Thinner:  Continue Eliquis  Covid test 07/31/19 negative  Anesthesia Review:  Yes, hx of arrhythmia  Patient denies shortness of breath, fever, cough, and chest pain at PAT appointment.  Patient verbalized understanding of instructions provided today at the PAT appointment.  Patient asked to review instructions at home and day of surgery.

## 2020-08-01 ENCOUNTER — Other Ambulatory Visit: Payer: Self-pay | Admitting: Family Medicine

## 2020-08-01 NOTE — Anesthesia Preprocedure Evaluation (Addendum)
Anesthesia Evaluation  Patient identified by MRN, date of birth, ID band Patient awake    Reviewed: Allergy & Precautions, NPO status , Patient's Chart, lab work & pertinent test results, reviewed documented beta blocker date and time   History of Anesthesia Complications (+) PONV and history of anesthetic complications (only gets sick sometimes, per pt- not w/ MRI)  Airway Mallampati: II  TM Distance: >3 FB Neck ROM: Full    Dental  (+) Teeth Intact, Partial Lower, Missing,    Pulmonary asthma ,    Pulmonary exam normal breath sounds clear to auscultation       Cardiovascular hypertension, Pt. on home beta blockers and Pt. on medications Normal cardiovascular exam+ dysrhythmias (eliquis) Atrial Fibrillation  Rhythm:Regular Rate:Normal     Neuro/Psych PSYCHIATRIC DISORDERS (claustrophobia) Anxiety  Neuromuscular disease (RLS)    GI/Hepatic GERD  Controlled,  Endo/Other  Hypothyroidism   Renal/GU   negative genitourinary   Musculoskeletal  (+) Arthritis , Osteoarthritis,    Abdominal   Peds  Hematology  (+) Blood dyscrasia, anemia ,   Anesthesia Other Findings   Reproductive/Obstetrics negative OB ROS                            Anesthesia Physical Anesthesia Plan  ASA: III  Anesthesia Plan: General   Post-op Pain Management:    Induction: Intravenous  PONV Risk Score and Plan: 4 or greater and Ondansetron, Dexamethasone, Treatment may vary due to age or medical condition and Midazolam  Airway Management Planned: Oral ETT  Additional Equipment: None  Intra-op Plan:   Post-operative Plan: Extubation in OR  Informed Consent: I have reviewed the patients History and Physical, chart, labs and discussed the procedure including the risks, benefits and alternatives for the proposed anesthesia with the patient or authorized representative who has indicated his/her understanding and  acceptance.     Dental advisory given  Plan Discussed with: CRNA  Anesthesia Plan Comments:        Anesthesia Quick Evaluation
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
## 2020-08-02 ENCOUNTER — Encounter (HOSPITAL_COMMUNITY): Payer: Self-pay

## 2020-08-02 ENCOUNTER — Ambulatory Visit (HOSPITAL_COMMUNITY): Payer: Medicare Other | Admitting: Anesthesiology

## 2020-08-02 ENCOUNTER — Ambulatory Visit (HOSPITAL_COMMUNITY)
Admission: RE | Admit: 2020-08-02 | Discharge: 2020-08-02 | Disposition: A | Discharge status: Home / Self Care | Payer: Medicare Other | Attending: Student | Admitting: Student

## 2020-08-02 ENCOUNTER — Other Ambulatory Visit: Payer: Self-pay

## 2020-08-02 ENCOUNTER — Encounter (HOSPITAL_COMMUNITY)
Admission: RE | Disposition: A | Discharge status: Home / Self Care | Payer: Self-pay | Source: Home / Self Care | Attending: Student

## 2020-08-02 ENCOUNTER — Ambulatory Visit (HOSPITAL_COMMUNITY)
Admission: RE | Admit: 2020-08-02 | Discharge: 2020-08-02 | Disposition: A | Discharge status: Home / Self Care | Payer: Medicare Other | Source: Ambulatory Visit | Attending: Student | Admitting: Student

## 2020-08-02 DIAGNOSIS — M5416 Radiculopathy, lumbar region: Secondary | ICD-10-CM

## 2020-08-02 DIAGNOSIS — Z981 Arthrodesis status: Secondary | ICD-10-CM | POA: Diagnosis not present

## 2020-08-02 DIAGNOSIS — M48061 Spinal stenosis, lumbar region without neurogenic claudication: Secondary | ICD-10-CM | POA: Insufficient documentation

## 2020-08-02 DIAGNOSIS — Z7901 Long term (current) use of anticoagulants: Secondary | ICD-10-CM | POA: Insufficient documentation

## 2020-08-02 DIAGNOSIS — M4726 Other spondylosis with radiculopathy, lumbar region: Secondary | ICD-10-CM | POA: Insufficient documentation

## 2020-08-02 DIAGNOSIS — M545 Low back pain, unspecified: Secondary | ICD-10-CM | POA: Insufficient documentation

## 2020-08-02 DIAGNOSIS — I10 Essential (primary) hypertension: Secondary | ICD-10-CM | POA: Diagnosis not present

## 2020-08-02 DIAGNOSIS — Z79899 Other long term (current) drug therapy: Secondary | ICD-10-CM | POA: Diagnosis not present

## 2020-08-02 DIAGNOSIS — I480 Paroxysmal atrial fibrillation: Secondary | ICD-10-CM | POA: Insufficient documentation

## 2020-08-02 DIAGNOSIS — M5115 Intervertebral disc disorders with radiculopathy, thoracolumbar region: Secondary | ICD-10-CM | POA: Diagnosis not present

## 2020-08-02 DIAGNOSIS — Z79891 Long term (current) use of opiate analgesic: Secondary | ICD-10-CM | POA: Insufficient documentation

## 2020-08-02 DIAGNOSIS — J45909 Unspecified asthma, uncomplicated: Secondary | ICD-10-CM | POA: Diagnosis not present

## 2020-08-02 DIAGNOSIS — Z20822 Contact with and (suspected) exposure to covid-19: Secondary | ICD-10-CM | POA: Insufficient documentation

## 2020-08-02 DIAGNOSIS — Z96652 Presence of left artificial knee joint: Secondary | ICD-10-CM | POA: Diagnosis not present

## 2020-08-02 DIAGNOSIS — M5116 Intervertebral disc disorders with radiculopathy, lumbar region: Secondary | ICD-10-CM | POA: Diagnosis not present

## 2020-08-02 DIAGNOSIS — Z79890 Hormone replacement therapy (postmenopausal): Secondary | ICD-10-CM | POA: Insufficient documentation

## 2020-08-02 DIAGNOSIS — G8929 Other chronic pain: Secondary | ICD-10-CM | POA: Diagnosis not present

## 2020-08-02 DIAGNOSIS — E039 Hypothyroidism, unspecified: Secondary | ICD-10-CM | POA: Diagnosis not present

## 2020-08-02 DIAGNOSIS — M4727 Other spondylosis with radiculopathy, lumbosacral region: Secondary | ICD-10-CM | POA: Diagnosis not present

## 2020-08-02 HISTORY — PX: RADIOLOGY WITH ANESTHESIA: SHX6223

## 2020-08-02 LAB — BASIC METABOLIC PANEL
Anion gap: 10 (ref 5–15)
BUN: 18 mg/dL (ref 8–23)
CO2: 23 mmol/L (ref 22–32)
Calcium: 8.8 mg/dL — ABNORMAL LOW (ref 8.9–10.3)
Chloride: 105 mmol/L (ref 98–111)
Creatinine, Ser: 0.88 mg/dL (ref 0.44–1.00)
GFR, Estimated: >60 mL/min (ref >60)
Glucose, Bld: 84 mg/dL (ref 70–99)
Potassium: 4 mmol/L (ref 3.5–5.1)
Sodium: 138 mmol/L (ref 135–145)

## 2020-08-02 LAB — CBC
HCT: 40.7 % (ref 36.0–46.0)
Hemoglobin: 13.4 g/dL (ref 12.0–15.0)
MCH: 29.9 pg (ref 26.0–34.0)
MCHC: 32.9 g/dL (ref 30.0–36.0)
MCV: 90.8 fL (ref 80.0–100.0)
Platelets: 215 10*3/uL (ref 150–400)
RBC: 4.48 MIL/uL (ref 3.87–5.11)
RDW: 13.6 % (ref 11.5–15.5)
WBC: 7.2 10*3/uL (ref 4.0–10.5)
nRBC: 0 % (ref 0.0–0.2)

## 2020-08-02 SURGERY — MRI WITH ANESTHESIA
Anesthesia: General

## 2020-08-02 MED ORDER — GADOBUTROL 1 MMOL/ML IV SOLN
7.0000 mL | Freq: Once | INTRAVENOUS | Status: AC | PRN
  Administered 2020-08-02: 7 mL via INTRAVENOUS

## 2020-08-02 MED ORDER — ONDANSETRON HCL 4 MG/2ML IJ SOLN: 4.0000 mg | Freq: Once | INTRAMUSCULAR | Status: DC | PRN

## 2020-08-02 MED ORDER — ORAL CARE MOUTH RINSE: 15.0000 mL | Freq: Once | OROMUCOSAL | Status: AC

## 2020-08-02 MED ORDER — ONDANSETRON HCL 4 MG/2ML IJ SOLN
INTRAMUSCULAR | Status: DC | PRN
  Administered 2020-08-02: 4 mg via INTRAVENOUS

## 2020-08-02 MED ORDER — PHENYLEPHRINE HCL (PRESSORS) 10 MG/ML IV SOLN
INTRAVENOUS | Status: DC | PRN
  Administered 2020-08-02: 80 ug via INTRAVENOUS

## 2020-08-02 MED ORDER — LIDOCAINE HCL (CARDIAC) PF 100 MG/5ML IV SOSY
PREFILLED_SYRINGE | INTRAVENOUS | Status: DC | PRN
  Administered 2020-08-02: 50 mg via INTRATRACHEAL

## 2020-08-02 MED ORDER — PROPOFOL 10 MG/ML IV BOLUS
INTRAVENOUS | Status: DC | PRN
  Administered 2020-08-02: 50 mg via INTRAVENOUS
  Administered 2020-08-02: 150 mg via INTRAVENOUS

## 2020-08-02 MED ORDER — AMISULPRIDE (ANTIEMETIC) 5 MG/2ML IV SOLN: 10.0000 mg | Freq: Once | INTRAVENOUS | Status: DC | PRN

## 2020-08-02 MED ORDER — LACTATED RINGERS IV SOLN: INTRAVENOUS | Status: DC

## 2020-08-02 MED ORDER — CHLORHEXIDINE GLUCONATE 0.12 % MT SOLN
15.0000 mL | Freq: Once | OROMUCOSAL | Status: AC
  Administered 2020-08-02: 15 mL via OROMUCOSAL
  Filled 2020-08-02: qty 15

## 2020-08-02 NOTE — Transfer of Care (Signed)
Immediate Anesthesia Transfer of Care Note  Patient: Melissa Jordan  Procedure(s) Performed: MRI WITH ANESTHESIA LUMBAR WITH AND WITHOUT CONTRAST (N/A )  Patient Location: PACU  Anesthesia Type:General  Level of Consciousness: awake, alert  and oriented  Airway & Oxygen Therapy: Patient Spontanous Breathing  Post-op Assessment: Report given to RN and Post -op Vital signs reviewed and stable  Post vital signs: Reviewed and stable  Last Vitals:  Vitals Value Taken Time  BP 125/62 08/02/20 0930  Temp    Pulse 25 08/02/20 0936  Resp 12 08/02/20 0936  SpO2 99 % 08/02/20 0936  Vitals shown include unvalidated device data.  Last Pain:  Vitals:   08/02/20 0915  PainSc: 0-No pain      Patients Stated Pain Goal: 4 (08/02/20 0724)  Complications: No complications documented.

## 2020-08-02 NOTE — Anesthesia Postprocedure Evaluation (Signed)
Anesthesia Post Note  Patient: Melissa Jordan  Procedure(s) Performed: MRI WITH ANESTHESIA LUMBAR WITH AND WITHOUT CONTRAST (N/A )     Patient location during evaluation: PACU Anesthesia Type: General Level of consciousness: awake and alert, oriented and patient cooperative Pain management: pain level controlled Vital Signs Assessment: post-procedure vital signs reviewed and stable Respiratory status: spontaneous breathing, nonlabored ventilation and respiratory function stable Cardiovascular status: blood pressure returned to baseline and stable Postop Assessment: no apparent nausea or vomiting Anesthetic complications: no   No complications documented.  Last Vitals:  Vitals:   08/02/20 0930 08/02/20 0945  BP: 125/62 (!) 125/59  Pulse: 65 72  Resp: 17 13  Temp:  36.7 C  SpO2: 100% 100%    Last Pain:  Vitals:   08/02/20 0945  PainSc: 0-No pain                 Lannie Fields

## 2020-08-02 NOTE — Anesthesia Procedure Notes (Signed)
Procedure Name: LMA Insertion Date/Time: 08/02/2020 8:28 AM Performed by: Macie Burows, CRNA Pre-anesthesia Checklist: Patient identified, Emergency Drugs available, Patient being monitored, Suction available and Timeout performed Patient Re-evaluated:Patient Re-evaluated prior to induction Oxygen Delivery Method: Circle system utilized Preoxygenation: Pre-oxygenation with 100% oxygen Induction Type: IV induction Ventilation: Mask ventilation without difficulty LMA: LMA inserted LMA Size: 4.0 Placement Confirmation: positive ETCO2 and breath sounds checked- equal and bilateral Tube secured with: Tape Dental Injury: Teeth and Oropharynx as per pre-operative assessment

## 2020-08-03 ENCOUNTER — Encounter (HOSPITAL_COMMUNITY): Payer: Self-pay | Admitting: Radiology

## 2020-08-07 DIAGNOSIS — R030 Elevated blood-pressure reading, without diagnosis of hypertension: Secondary | ICD-10-CM | POA: Insufficient documentation

## 2020-08-07 DIAGNOSIS — M961 Postlaminectomy syndrome, not elsewhere classified: Secondary | ICD-10-CM | POA: Diagnosis not present

## 2020-08-07 DIAGNOSIS — M5416 Radiculopathy, lumbar region: Secondary | ICD-10-CM | POA: Diagnosis not present

## 2020-08-15 ENCOUNTER — Encounter: Payer: Self-pay | Admitting: Family Medicine

## 2020-08-28 DIAGNOSIS — M25551 Pain in right hip: Secondary | ICD-10-CM | POA: Diagnosis not present

## 2020-08-28 DIAGNOSIS — M961 Postlaminectomy syndrome, not elsewhere classified: Secondary | ICD-10-CM | POA: Diagnosis not present

## 2020-08-28 DIAGNOSIS — M5416 Radiculopathy, lumbar region: Secondary | ICD-10-CM | POA: Diagnosis not present

## 2020-09-07 ENCOUNTER — Other Ambulatory Visit: Payer: Self-pay

## 2020-09-07 DIAGNOSIS — G2581 Restless legs syndrome: Secondary | ICD-10-CM

## 2020-09-07 MED ORDER — ROPINIROLE HCL 2 MG PO TABS: 2.0000 mg | ORAL_TABLET | Freq: Every day | ORAL | 1 refills | Status: DC

## 2020-09-12 DIAGNOSIS — M5416 Radiculopathy, lumbar region: Secondary | ICD-10-CM | POA: Diagnosis not present

## 2020-09-12 DIAGNOSIS — M961 Postlaminectomy syndrome, not elsewhere classified: Secondary | ICD-10-CM | POA: Diagnosis not present

## 2020-09-26 ENCOUNTER — Encounter: Payer: Medicare Other | Admitting: Family Medicine

## 2020-09-26 DIAGNOSIS — M5380 Other specified dorsopathies, site unspecified: Secondary | ICD-10-CM | POA: Diagnosis not present

## 2020-09-26 DIAGNOSIS — G894 Chronic pain syndrome: Secondary | ICD-10-CM | POA: Diagnosis not present

## 2020-09-26 DIAGNOSIS — M961 Postlaminectomy syndrome, not elsewhere classified: Secondary | ICD-10-CM | POA: Diagnosis not present

## 2020-10-01 DIAGNOSIS — M961 Postlaminectomy syndrome, not elsewhere classified: Secondary | ICD-10-CM | POA: Diagnosis not present

## 2020-10-03 ENCOUNTER — Telehealth: Payer: Self-pay

## 2020-10-03 NOTE — Telephone Encounter (Signed)
   [facility] Pre-operative Risk Assessment    Patient Name: Melissa Jordan  DOB: 1945/07/18  MRN: [id number]   HEARTCARE STAFF: - Please ensure there is not already an duplicate clearance open for this procedure. - Under Visit Info/Reason for Call, type in Other and utilize the format Clearance MM/DD/YY or Clearance TBD. Do not use dashes or single digits. - If request is for dental extraction, please clarify the # of teeth to be extracted.  Request for surgical clearance:  1. What type of surgery is being performed? Permanent Placement of Spinal Cord Stimulator   2. When is this surgery scheduled? TBD   3. What type of clearance is required (medical clearance vs. Pharmacy clearance to hold med vs. Both)? Both   4. Are there any medications that need to be held prior to surgery and how long? Eliquis    5. Practice name and name of physician performing surgery? [facility], Dr. Lenord Carbo  6. What is the office phone number? [phone]   7.   What is the office fax number? [phone] Attn: Janett Billow  8.   Anesthesia type (None, local, MAC, general) ? Not listed   Jacqulynn Cadet 10/03/2020, 3:27 PM  _________________________________________________________________   (provider comments below)

## 2020-10-04 ENCOUNTER — Other Ambulatory Visit: Payer: Self-pay

## 2020-10-04 ENCOUNTER — Encounter: Payer: Self-pay | Admitting: Family Medicine

## 2020-10-04 ENCOUNTER — Telehealth (INDEPENDENT_AMBULATORY_CARE_PROVIDER_SITE_OTHER): Payer: Medicare Other | Admitting: Family Medicine

## 2020-10-04 DIAGNOSIS — R300 Dysuria: Secondary | ICD-10-CM | POA: Diagnosis not present

## 2020-10-04 DIAGNOSIS — Z9689 Presence of other specified functional implants: Secondary | ICD-10-CM | POA: Insufficient documentation

## 2020-10-04 MED ORDER — CEPHALEXIN 500 MG PO CAPS: 500.0000 mg | ORAL_CAPSULE | Freq: Two times a day (BID) | ORAL | 0 refills | Status: AC

## 2020-10-04 NOTE — Progress Notes (Signed)
Virtual Visit via Video   I connected with patient on 10/04/20 at  9:30 AM EDT by a video enabled telemedicine application and verified that I am speaking with the correct person using two identifiers.  Location patient: Home Location provider: [place] [facility], Office Persons participating in the virtual visit: Patient, Provider, CMA (Sabrina M)  I discussed the limitations of evaluation and management by telemedicine and the availability of in person appointments. The patient expressed understanding and agreed to proceed.  Subjective:   HPI:   UTI- sxs started yesterday.  'yesterday I thought it might be, today I know it is'.  + foul smelling urine, dysuria, pain upon emptying.  No nausea, fevers.  Pt has 'been pushing the fluids'.  ROS:   See pertinent positives and negatives per HPI.  Patient Active Problem List   Diagnosis Date Noted  . Spinal cord stimulator status 10/04/2020  . Elevated blood-pressure reading, without diagnosis of hypertension 08/07/2020  . Opioid dependence (HCC) 05/14/2020  . Chronic pain syndrome 02/15/2020  . History of lumbosacral spine surgery 02/15/2020  . 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 Sacroiliitis (HCC) 08/09/2019  . Body mass index (BMI) 27.0-27.9, adult 05/17/2019  . 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 Traumatic arthritis of right ankle   . Pain in right ankle and joints of right foot 08/05/2018  . A-fib (HCC) 03/10/2017  . 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 RLS (restless legs syndrome) 01/11/2015  . Osteopenia 05/15/2014  . Hyperlipidemia 05/24/2013  . Routine general medical examination at a health care facility 03/29/2012  . POSTMENOPAUSAL SYNDROME 09/26/2009  . URINARY URGENCY 09/26/2009  . ANEMIA 01/09/2009  . Back pain of lumbar region with sciatica 01/09/2009  . LAMINECTOMY, LUMBAR, HX OF 08/09/2008  . Hypothyroid 03/03/2007  . HYPERTENSION, BENIGN 03/03/2007  . GERD 02/10/2007     Social History   Tobacco Use  . Smoking status: Never Smoker  . Smokeless tobacco: Never Used  Substance Use Topics  . Alcohol use: No    Current Outpatient Medications:  .  acetaminophen (TYLENOL) 500 MG tablet, Take 1,000 mg by mouth every 6 (six) hours as needed for moderate pain. , Disp: , Rfl:  .  Biotin 83151 MCG TABS, Take 10,000 mcg by mouth daily., Disp: , Rfl:  .  CALCIUM-VITAMIN D PO, Take 1 tablet by mouth daily. , Disp: , Rfl:  .  Cholecalciferol (VITAMIN D) 125 MCG (5000 UT) CAPS, Take 5,000 Units by mouth daily., Disp: , Rfl:  .  ELIQUIS 5 MG TABS tablet, Take 1 tablet by mouth twice daily (Patient taking differently: Take 5 mg by mouth 2 (two) times daily.), Disp: 180 tablet, Rfl: 1 .  estradiol (ESTRACE) 1 MG tablet, Take 1 tablet by mouth once daily, Disp: 90 tablet, Rfl: 0 .  fluticasone (FLONASE) 50 MCG/ACT nasal spray, Place 2 sprays into both nostrils daily as needed for rhinitis or allergies., Disp: , Rfl:  .  levothyroxine (SYNTHROID) 75 MCG tablet, TAKE 1 TABLET BY MOUTH  DAILY (Patient taking differently: Take 75 mcg by mouth daily before breakfast.), Disp: 90 tablet, Rfl: 3 .  losartan (COZAAR) 50 MG tablet, Take 50 mg by mouth daily., Disp: , Rfl:  .  magnesium oxide (MAG-OX) 400 MG tablet, Take 400 mg by mouth daily., Disp: , Rfl:  .  metoprolol tartrate (LOPRESSOR) 25 MG tablet, TAKE 1 TABLET BY MOUTH  TWICE DAILY (Patient taking differently: Take 25 mg by mouth 2 (two) times daily.),  Disp: 180 tablet, Rfl: 3 .  Polyethyl Glycol-Propyl Glycol (SYSTANE) 0.4-0.3 % SOLN, Place 1 drop into both eyes daily., Disp: , Rfl:  .  rOPINIRole (REQUIP) 2 MG tablet, Take 1 tablet (2 mg total) by mouth at bedtime., Disp: 90 tablet, Rfl: 1 .  traMADol (ULTRAM) 50 MG tablet, Take 50 mg by mouth every 6 (six) hours as needed for moderate pain or severe pain., Disp: , Rfl:   Allergies  Allergen Reactions  . Lyrica [Pregabalin] Palpitations and Other (See Comments)    "thought  I was going to have a heart attack; heart started pounding so hard, I couldn't catch my breath" (06/28/2012)  . Meclizine Other (See Comments)    "what was in my mind to say wasn't what came out to say; I was kind of in another world" (06/28/2012)  . Penicillins Itching and Rash    Has patient had a PCN reaction causing immediate rash, facial/tongue/throat swelling, SOB or lightheadedness with hypotension: No Has patient had a PCN reaction causing severe rash involving mucus membranes or skin necrosis: No Has patient had a PCN reaction that required hospitalization No Has patient had a PCN reaction occurring within the last 10 years:   # # YES # #  If all of the above answers are "NO", then may proceed with Cephalosporin use.   . Poison Sumac Extract Swelling, Rash and Other (See Comments)    Blisters  . Acetaminophen   . Propoxyphene   . Bactrim [Sulfamethoxazole-Trimethoprim] Rash  . Codeine Nausea Only  . Morphine Sulfate Rash  . Propoxyphene N-Acetaminophen Nausea And Vomiting    "Darvocet"    Objective:   There were no vitals taken for this visit. AAOx3, NAD NCAT, EOMI No obvious CN deficits Coloring WNL Pt is able to speak clearly, coherently without shortness of breath or increased work of breathing.  Thought process is linear.  Mood is appropriate.   Assessment and Plan:   Dysuria- new.  Pt's sxs are consistent w/ infxn and similar to previous UTIs.  Since she is out of town will send empiric abx.  Reviewed supportive care and red flags that should prompt return.  Pt expressed understanding and is in agreement w/ plan.    Neena Rhymes, MD 10/04/2020

## 2020-10-04 NOTE — Progress Notes (Signed)
I connected with  Melissa Jordan on 10/04/20 by a video enabled telemedicine application and verified that I am speaking with the correct person using two identifiers.   I discussed the limitations of evaluation and management by telemedicine. The patient expressed understanding and agreed to proceed.

## 2020-10-04 NOTE — Telephone Encounter (Signed)
Patient with diagnosis of atrial fibrillation on Eliquis for anticoagulation.    Procedure: placement of spinal cord stimulator Date of procedure: TBD   CHA2DS2-VASc Score = 3  This indicates a 3.2% annual risk of stroke. The patient's score is based upon: CHF History: No HTN History: Yes Diabetes History: No Stroke History: No Vascular Disease History: No Age Score: 1 Gender Score: 1    CrCl 59.9 Platelet count 215  Per office protocol, patient can hold Eliquis for 3 days prior to procedure.   Patient will not need bridging with Lovenox (enoxaparin) around procedure.

## 2020-10-04 NOTE — Telephone Encounter (Signed)
Please comment on Eliquis. Thanks! 

## 2020-10-05 NOTE — Telephone Encounter (Signed)
appt notes have been changed to reflect Pre Op clearance is needed as well. I will forward notes to MD for upcoming appt. Will send FYI to requesting office pt has appt 10/23/20; clearance to be addressed at that time.

## 2020-10-05 NOTE — Telephone Encounter (Addendum)
Per Pharmacy recs OK to hold Eliquis 3 days prior to procedure, no bridging required.   Patient last seen over a year ago. She has an upcoming appointment 5/3. Spinal cords stimulator placement scheduled for 5/18. Pre-op evaluation can be performed at that time. Will route to call back team to change apt to pre-op/annual eval.

## 2020-10-17 ENCOUNTER — Other Ambulatory Visit: Payer: Self-pay

## 2020-10-17 ENCOUNTER — Ambulatory Visit (INDEPENDENT_AMBULATORY_CARE_PROVIDER_SITE_OTHER): Payer: Medicare Other | Admitting: Family Medicine

## 2020-10-17 ENCOUNTER — Encounter: Payer: Self-pay | Admitting: Family Medicine

## 2020-10-17 VITALS — BP 118/70 | HR 70 | Temp 98.2°F | Resp 18 | Ht 63.0 in | Wt 154.0 lb

## 2020-10-17 DIAGNOSIS — I10 Essential (primary) hypertension: Secondary | ICD-10-CM

## 2020-10-17 DIAGNOSIS — M8580 Other specified disorders of bone density and structure, unspecified site: Secondary | ICD-10-CM

## 2020-10-17 DIAGNOSIS — Z0000 Encounter for general adult medical examination without abnormal findings: Secondary | ICD-10-CM | POA: Diagnosis not present

## 2020-10-17 DIAGNOSIS — R8290 Unspecified abnormal findings in urine: Secondary | ICD-10-CM | POA: Diagnosis not present

## 2020-10-17 LAB — LIPID PANEL
Cholesterol: 165 mg/dL (ref 0–200)
HDL: 61.1 mg/dL (ref >39.00)
LDL Cholesterol: 88 mg/dL (ref 0–99)
NonHDL: 103.89
Total CHOL/HDL Ratio: 3
Triglycerides: 81 mg/dL (ref 0.0–149.0)
VLDL: 16.2 mg/dL (ref 0.0–40.0)

## 2020-10-17 LAB — CBC WITH DIFFERENTIAL/PLATELET
Basophils Absolute: 0.1 10*3/uL (ref 0.0–0.1)
Basophils Relative: 0.7 % (ref 0.0–3.0)
Eosinophils Absolute: 0.2 10*3/uL (ref 0.0–0.7)
Eosinophils Relative: 1.7 % (ref 0.0–5.0)
HCT: 38 % (ref 36.0–46.0)
Hemoglobin: 12.6 g/dL (ref 12.0–15.0)
Lymphocytes Relative: 11.2 % — ABNORMAL LOW (ref 12.0–46.0)
Lymphs Abs: 1.3 10*3/uL (ref 0.7–4.0)
MCHC: 33.1 g/dL (ref 30.0–36.0)
MCV: 89 fl (ref 78.0–100.0)
Monocytes Absolute: 1 10*3/uL (ref 0.1–1.0)
Monocytes Relative: 8.5 % (ref 3.0–12.0)
Neutro Abs: 9.2 10*3/uL — ABNORMAL HIGH (ref 1.4–7.7)
Neutrophils Relative %: 77.9 % — ABNORMAL HIGH (ref 43.0–77.0)
Platelets: 167 10*3/uL (ref 150.0–400.0)
RBC: 4.27 Mil/uL (ref 3.87–5.11)
RDW: 13.2 % (ref 11.5–15.5)
WBC: 11.8 10*3/uL — ABNORMAL HIGH (ref 4.0–10.5)

## 2020-10-17 LAB — POCT URINALYSIS DIPSTICK
Glucose, UA: NEGATIVE
Ketones, UA: NEGATIVE
Nitrite, UA: NEGATIVE
Protein, UA: POSITIVE — AB
Spec Grav, UA: 1.01 (ref 1.010–1.025)
Urobilinogen, UA: 0.2 E.U./dL
pH, UA: 7 (ref 5.0–8.0)

## 2020-10-17 LAB — BASIC METABOLIC PANEL
BUN: 16 mg/dL (ref 6–23)
CO2: 28 mEq/L (ref 19–32)
Calcium: 9.2 mg/dL (ref 8.4–10.5)
Chloride: 102 mEq/L (ref 96–112)
Creatinine, Ser: 0.85 mg/dL (ref 0.40–1.20)
GFR: 67.34 mL/min (ref >60.00)
Glucose, Bld: 91 mg/dL (ref 70–99)
Potassium: 4 mEq/L (ref 3.5–5.1)
Sodium: 138 mEq/L (ref 135–145)

## 2020-10-17 LAB — HEPATIC FUNCTION PANEL
ALT: 10 U/L (ref 0–35)
AST: 15 U/L (ref 0–37)
Albumin: 4.2 g/dL (ref 3.5–5.2)
Alkaline Phosphatase: 92 U/L (ref 39–117)
Bilirubin, Direct: 0.1 mg/dL (ref 0.0–0.3)
Total Bilirubin: 0.7 mg/dL (ref 0.2–1.2)
Total Protein: 6.2 g/dL (ref 6.0–8.3)

## 2020-10-17 LAB — TSH: TSH: 0.2 u[IU]/mL — ABNORMAL LOW (ref 0.35–4.50)

## 2020-10-17 LAB — VITAMIN D 25 HYDROXY (VIT D DEFICIENCY, FRACTURES): VITD: 62.74 ng/mL (ref 30.00–100.00)

## 2020-10-17 MED ORDER — GUAIFENESIN-CODEINE 100-10 MG/5ML PO SYRP: 10.0000 mL | ORAL_SOLUTION | Freq: Three times a day (TID) | ORAL | 0 refills | Status: DC | PRN

## 2020-10-17 NOTE — Addendum Note (Signed)
Addended by: Ilda Foil on: 10/17/2020 02:17 PM   Modules accepted: Orders

## 2020-10-17 NOTE — Assessment & Plan Note (Signed)
Pt's PE WNL w/ exception of laryngitis and cough.  UTD on colonoscopy.  Pt plans to schedule mammo and DEXA.  Declines pneumonia vaccines.  Check labs.  Anticipatory guidance provided.

## 2020-10-17 NOTE — Progress Notes (Signed)
Subjective:    Patient ID: Melissa Jordan, female    DOB: 1946-04-13, 75 y.o.   MRN: [id number]  HPI CPE- UTD on colonoscopy.  Due for mammo, DEXA- pt plans to schedule at [facility].  Due for Prevnar.  UTD on COVID  Pt declines pneumonia vaccines  Reviewed past medical, surgical, family and social histories.   Patient Care Team    Relationship Specialty Notifications Start End  Sheliah Hatch, MD PCP - General Family Medicine  03/30/12    Comment: Iver Nestle, Tanna Savoy, MD PCP - Cardiology Cardiology Admissions 07/28/19   Trey Sailors, MD Attending Physician Neurosurgery  05/15/14   Dannielle Huh, MD Consulting Physician Orthopedic Surgery  07/30/16   Liberty Handy, OD  Optometry  07/30/16   Kaylyn Layer  Dentistry  07/30/16   Rinaldo Cloud, MD Consulting Physician Cardiology  03/10/17   Dahlia Byes, [facility] Pharmacist Pharmacist  05/08/20    Comment: [phone]     Health Maintenance  Topic Date Due  . MAMMOGRAM  01/28/2021 (Originally 07/17/2020)  . COVID-19 Vaccine (3 - Booster for Pfizer series) 02/25/2021 (Originally 03/11/2020)  . PNA vac Low Risk Adult (1 of 2 - PCV13) 02/25/2021 (Originally 05/25/2011)  . Hepatitis C Screening  10/04/2021 (Originally [birth year])  . INFLUENZA VACCINE  01/21/2021  . TETANUS/TDAP  08/21/2021  . COLONOSCOPY (Pts 45-29yrs Insurance coverage will need to be confirmed)  08/08/2025  . DEXA SCAN  Completed  . HPV VACCINES  Aged Out      Review of Systems Patient reports no vision/ hearing changes, adenopathy,fever, weight change,  persistant/recurrent hoarseness , swallowing issues, chest pain, edema, persistant/recurrent cough, hemoptysis, dyspnea (rest/exertional/paroxysmal nocturnal), gastrointestinal bleeding (melena, rectal bleeding), abdominal pain, significant heartburn, bowel changes, GU symptoms (dysuria, hematuria, incontinence), Gyn symptoms (abnormal  bleeding, pain),  syncope, focal weakness, memory loss,  skin/hair/nail changes, abnormal bruising or bleeding, anxiety, or depression.   + palpitations- pt has a 'few irregular beats and then it stops' + numbness of feet- due to back issues + acute cough- started Sunday  This visit occurred during the SARS-CoV-2 public health emergency.  Safety protocols were in place, including screening questions prior to the visit, additional usage of staff PPE, and extensive cleaning of exam room while observing appropriate contact time as indicated for disinfecting solutions.       Objective:   Physical Exam General Appearance:    Alert, cooperative, no distress, appears stated age  Head:    Normocephalic, without obvious abnormality, atraumatic  Eyes:    PERRL, conjunctiva/corneas clear, EOM's intact, fundi    benign, both eyes  Ears:    Normal TM's and external ear canals, both ears  Nose:   Deferred due to COVID  Throat:   Neck:   Supple, symmetrical, trachea midline, no adenopathy;    Thyroid: no enlargement/tenderness/nodules  Back:     Symmetric, no curvature, ROM normal, no CVA tenderness  Lungs:     Clear to auscultation bilaterally, respirations unlabored  Chest Wall:    No tenderness or deformity   Heart:    Regular rate and rhythm, S1 and S2 normal, no murmur, rub   or gallop  Breast Exam:    Deferred to mammo  Abdomen:     Soft, non-tender, bowel sounds active all four quadrants,    no masses, no organomegaly  Genitalia:    Deferred to GYN  Rectal:    Extremities:   Extremities normal, atraumatic, no cyanosis or edema  Pulses:   2+ and symmetric all extremities  Skin:   Skin color, texture, turgor normal, no rashes or lesions  Lymph nodes:   Cervical, supraclavicular, and axillary nodes normal  Neurologic:   CNII-XII intact, normal strength, sensation and reflexes    throughout         Assessment & Plan:

## 2020-10-17 NOTE — Assessment & Plan Note (Signed)
Pt is due for repeat DEXA- ordered.  Check Vit D and replete prn.

## 2020-10-17 NOTE — Assessment & Plan Note (Signed)
Chronic problem.  Currently well controlled.  Asymptomatic.  Check labs.  Will follow.

## 2020-10-17 NOTE — Patient Instructions (Addendum)
Follow up in 6 months to recheck BP We'll notify you of your lab results and make any changes if needed Keep up the good work!  You look great! Call and schedule your mammo and bone density at your convenience Use the Mucinex DM for daytime cough and the codeine syrup at night Call with any questions or concerns Happy Early Mother's Day!!!

## 2020-10-18 ENCOUNTER — Other Ambulatory Visit (INDEPENDENT_AMBULATORY_CARE_PROVIDER_SITE_OTHER): Payer: Medicare Other

## 2020-10-18 DIAGNOSIS — R7989 Other specified abnormal findings of blood chemistry: Secondary | ICD-10-CM | POA: Diagnosis not present

## 2020-10-18 LAB — T3, FREE: T3, Free: 7.5 pg/mL — ABNORMAL HIGH (ref 2.3–4.2)

## 2020-10-18 LAB — T4, FREE: Free T4: 2.07 ng/dL — ABNORMAL HIGH (ref 0.60–1.60)

## 2020-10-19 ENCOUNTER — Other Ambulatory Visit: Payer: Self-pay

## 2020-10-19 DIAGNOSIS — E039 Hypothyroidism, unspecified: Secondary | ICD-10-CM

## 2020-10-19 LAB — URINE CULTURE
MICRO NUMBER:: 11822205
SPECIMEN QUALITY:: ADEQUATE

## 2020-10-19 MED ORDER — LEVOTHYROXINE SODIUM 50 MCG PO TABS: 50.0000 ug | ORAL_TABLET | Freq: Every day | ORAL | 0 refills | Status: DC

## 2020-10-19 MED ORDER — CEPHALEXIN 500 MG PO CAPS: 500.0000 mg | ORAL_CAPSULE | Freq: Two times a day (BID) | ORAL | 0 refills | Status: DC
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
## 2020-10-23 ENCOUNTER — Telehealth (INDEPENDENT_AMBULATORY_CARE_PROVIDER_SITE_OTHER): Payer: Medicare Other | Admitting: Family Medicine

## 2020-10-23 ENCOUNTER — Encounter: Payer: Self-pay | Admitting: Family Medicine

## 2020-10-23 ENCOUNTER — Other Ambulatory Visit: Payer: Self-pay | Admitting: Family Medicine

## 2020-10-23 ENCOUNTER — Telehealth: Payer: Self-pay

## 2020-10-23 ENCOUNTER — Ambulatory Visit: Payer: Medicare Other | Admitting: Cardiology

## 2020-10-23 DIAGNOSIS — J209 Acute bronchitis, unspecified: Secondary | ICD-10-CM

## 2020-10-23 MED ORDER — PREDNISONE 10 MG PO TABS: ORAL_TABLET | ORAL | 0 refills | Status: DC

## 2020-10-23 MED ORDER — ALBUTEROL SULFATE HFA 108 (90 BASE) MCG/ACT IN AERS: 2.0000 | INHALATION_SPRAY | Freq: Four times a day (QID) | RESPIRATORY_TRACT | 0 refills | Status: DC | PRN

## 2020-10-23 NOTE — Progress Notes (Signed)
Virtual Visit via Video   I connected with patient on 10/23/20 at 11:30 AM EDT by a video enabled telemedicine application and verified that I am speaking with the correct person using two identifiers.  Location patient: Home Location provider: [facility] Persons participating in the virtual visit: Patient, Provider, CMA (Sabrina M)  I discussed the limitations of evaluation and management by telemedicine and the availability of in person appointments. The patient expressed understanding and agreed to proceed.  Subjective:   HPI:   URI- pt reports she has 'progressively gotten worse'.  No relief w/ Cheratussin.  No fever.  Cough is nonproductive, 'real tight feeling'.  Unable to sleep for last 4 nights.  No chills/bodyaches.  + HA from coughing.  Some SOB w/ coughing fits.  No known sick contacts.  Using Flonase and Claritin daily.  ROS:   See pertinent positives and negatives per HPI.  Patient Active Problem List   Diagnosis Date Noted  . Spinal cord stimulator status 10/04/2020  . Elevated blood-pressure reading, without diagnosis of hypertension 08/07/2020  . Opioid dependence (HCC) 05/14/2020  . Chronic pain syndrome 02/15/2020  . History of lumbosacral spine surgery 02/15/2020  . 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 Sacroiliitis (HCC) 08/09/2019  . Body mass index (BMI) 27.0-27.9, adult 05/17/2019  . 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 Traumatic arthritis of right ankle   . Pain in right ankle and joints of right foot 08/05/2018  . A-fib (HCC) 03/10/2017  . 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 RLS (restless legs syndrome) 01/11/2015  . Osteopenia 05/15/2014  . Hyperlipidemia 05/24/2013  . Routine general medical examination at a health care facility 03/29/2012  . POSTMENOPAUSAL SYNDROME 09/26/2009  . URINARY URGENCY 09/26/2009  . ANEMIA 01/09/2009  . Back pain of lumbar region with sciatica 01/09/2009  . LAMINECTOMY,  LUMBAR, HX OF 08/09/2008  . Hypothyroid 03/03/2007  . HYPERTENSION, BENIGN 03/03/2007  . GERD 02/10/2007    Social History   Tobacco Use  . Smoking status: Never Smoker  . Smokeless tobacco: Never Used  Substance Use Topics  . Alcohol use: No    Current Outpatient Medications:  .  acetaminophen (TYLENOL) 500 MG tablet, Take 1,000 mg by mouth every 6 (six) hours as needed for moderate pain. , Disp: , Rfl:  .  Biotin 96283 MCG TABS, Take 10,000 mcg by mouth daily., Disp: , Rfl:  .  CALCIUM-VITAMIN D PO, Take 1 tablet by mouth daily. , Disp: , Rfl:  .  cephALEXin (KEFLEX) 500 MG capsule, Take 1 capsule (500 mg total) by mouth 2 (two) times daily., Disp: 10 capsule, Rfl: 0 .  Cholecalciferol (VITAMIN D) 125 MCG (5000 UT) CAPS, Take 5,000 Units by mouth daily., Disp: , Rfl:  .  ELIQUIS 5 MG TABS tablet, Take 1 tablet by mouth twice daily (Patient taking differently: Take 5 mg by mouth 2 (two) times daily.), Disp: 180 tablet, Rfl: 1 .  estradiol (ESTRACE) 1 MG tablet, Take 1 tablet by mouth once daily, Disp: 90 tablet, Rfl: 0 .  fluticasone (FLONASE) 50 MCG/ACT nasal spray, Place 2 sprays into both nostrils daily as needed for rhinitis or allergies., Disp: , Rfl:  .  guaiFENesin-codeine (ROBITUSSIN AC) 100-10 MG/5ML syrup, Take 10 mLs by mouth 3 (three) times daily as needed for cough., Disp: 120 mL, Rfl: 0 .  levothyroxine (SYNTHROID) 50 MCG tablet, Take 1 tablet (50 mcg total) by mouth daily before breakfast., Disp: 90 tablet, Rfl: 0 .  losartan (COZAAR) 50 MG tablet, Take 50 mg by mouth daily., Disp: , Rfl:  .  magnesium oxide (MAG-OX) 400 MG tablet, Take 400 mg by mouth daily., Disp: , Rfl:  .  metoprolol tartrate (LOPRESSOR) 25 MG tablet, TAKE 1 TABLET BY MOUTH  TWICE DAILY (Patient taking differently: Take 25 mg by mouth 2 (two) times daily.), Disp: 180 tablet, Rfl: 3 .  Polyethyl Glycol-Propyl Glycol (SYSTANE) 0.4-0.3 % SOLN, Place 1 drop into both eyes daily., Disp: , Rfl:  .   rOPINIRole (REQUIP) 2 MG tablet, Take 1 tablet (2 mg total) by mouth at bedtime., Disp: 90 tablet, Rfl: 1 .  traMADol (ULTRAM) 50 MG tablet, Take 50 mg by mouth every 6 (six) hours as needed for moderate pain or severe pain., Disp: , Rfl:  .  levothyroxine (SYNTHROID) 75 MCG tablet, TAKE 1 TABLET BY MOUTH  DAILY (Patient not taking: Reported on 10/23/2020), Disp: 90 tablet, Rfl: 3  Allergies  Allergen Reactions  . Lyrica [Pregabalin] Palpitations and Other (See Comments)    "thought I was going to have a heart attack; heart started pounding so hard, I couldn't catch my breath" (06/28/2012)  . Meclizine Other (See Comments)    "what was in my mind to say wasn't what came out to say; I was kind of in another world" (06/28/2012)  . Penicillins Itching and Rash    Has patient had a PCN reaction causing immediate rash, facial/tongue/throat swelling, SOB or lightheadedness with hypotension: No Has patient had a PCN reaction causing severe rash involving mucus membranes or skin necrosis: No Has patient had a PCN reaction that required hospitalization No Has patient had a PCN reaction occurring within the last 10 years:   # # YES # #  If all of the above answers are "NO", then may proceed with Cephalosporin use.   . Poison Sumac Extract Swelling, Rash and Other (See Comments)    Blisters  . Acetaminophen   . Propoxyphene   . Bactrim [Sulfamethoxazole-Trimethoprim] Rash  . Codeine Nausea Only  . Morphine Sulfate Rash  . Propoxyphene N-Acetaminophen Nausea And Vomiting    "Darvocet"  . Vancomycin Rash    Objective:   There were no vitals taken for this visit. AAOx3, NAD NCAT, EOMI No obvious CN deficits Coloring WNL Pt is able to speak clearly, coherently without shortness of breath or increased work of breathing. Near constant hacking cough + laryngitis  Thought process is linear.  Mood is appropriate.   Assessment and Plan:   Bronchitis w/ bronchospasm- likely triggered by allergies as  pt states sxs started after '[place]'.  Will start Prednisone and Albuterol for symptom relief.  No evidence of bacterial infxn so abx not needed.  Reviewed supportive care and red flags that should prompt return.  Pt expressed understanding and is in agreement w/ plan.   Neena Rhymes, MD 10/23/2020

## 2020-10-23 NOTE — Progress Notes (Signed)
I connected with  Melissa Jordan on 10/23/20 by a video enabled telemedicine application and verified that I am speaking with the correct person using two identifiers.   I discussed the limitations of evaluation and management by telemedicine. The patient expressed understanding and agreed to proceed.

## 2020-10-23 NOTE — Telephone Encounter (Signed)
Error

## 2020-10-24 ENCOUNTER — Telehealth: Payer: Self-pay | Admitting: Cardiology

## 2020-10-24 NOTE — Telephone Encounter (Signed)
Forwarded for requesting p[roviders office

## 2020-10-24 NOTE — Telephone Encounter (Signed)
    Pt couldn't come in to her appointment yesterday due to being sick, today she r/s her appt and got the soonest available on 06/02 with Dot [facility], she just wanted to let Dr. Bjorn Pippin knows if that's ok and if he will still cleared her for her procedure

## 2020-10-24 NOTE — Telephone Encounter (Signed)
S/W pt and informed pt that the appt that she cancelled was for clearance and we cannot provide clearance until upcoming appt on 11-22-2020. Will forward to requesting provider's office.

## 2020-10-29 ENCOUNTER — Telehealth: Payer: Self-pay

## 2020-10-29 ENCOUNTER — Other Ambulatory Visit: Payer: Self-pay | Admitting: Family Medicine

## 2020-10-29 NOTE — Chronic Care Management (AMB) (Signed)
Chronic Care Management Pharmacy Assistant   Name: Melissa Jordan  MRN: [id number] DOB: Jul 10, 1945  Reason for Encounter:Hypertension Disease State Call   Recent office visits:  10/23/20- Neena Rhymes, MD- seen for cough, short course prednisone x 10 DS, started albuterol 108 mcg/act 10/17/20- Neena Rhymes, MD- seen for annual exam, started guaiFENesin-codeine  100-10 MG/5ML syrup, labs ordered, follow up 6 months to recheck BP Orders from lab results: started levothyroxine 50 mcg, short course Keflex 500 MG X 5 DS 10/04/20- Neena Rhymes MD Video Visit- seen for UTI symptoms. Short course Cephalexin 500mg  x 5DS. No followup noted.  08/16/20- 08/18/20 MD telephone note- Dose increase Ropinirole from 1mg  to 2mg .   Recent consult visits:  08/06/20- PhD (Pain Management)- seen for chronic pain.  No medication changes, followup as scheduled.   Hospital visits:  None in previous 6 months  Medications: Outpatient Encounter Medications as of 10/29/2020  Medication Sig  . acetaminophen (TYLENOL) 500 MG tablet Take 1,000 mg by mouth every 6 (six) hours as needed for moderate pain.   08/08/20 albuterol (VENTOLIN HFA) 108 (90 Base) MCG/ACT inhaler Inhale 2 puffs into the lungs every 6 (six) hours as needed for wheezing or shortness of breath.  . Biotin Marcy Siren MCG TABS Take 10,000 mcg by mouth daily.  12/29/2020 CALCIUM-VITAMIN D PO Take 1 tablet by mouth daily.   . cephALEXin (KEFLEX) 500 MG capsule Take 1 capsule (500 mg total) by mouth 2 (two) times daily.  . Cholecalciferol (VITAMIN D) 125 MCG (5000 UT) CAPS Take 5,000 Units by mouth daily.  Marland Kitchen ELIQUIS 5 MG TABS tablet Take 1 tablet by mouth twice daily (Patient taking differently: Take 5 mg by mouth 2 (two) times daily.)  . estradiol (ESTRACE) 1 MG tablet Take 1 tablet by mouth once daily  . fluticasone (FLONASE) 50 MCG/ACT nasal spray Place 2 sprays into both nostrils daily as needed for rhinitis or allergies.  [id number]  guaiFENesin-codeine (ROBITUSSIN AC) 100-10 MG/5ML syrup Take 10 mLs by mouth 3 (three) times daily as needed for cough.  . levothyroxine (SYNTHROID) 50 MCG tablet Take 1 tablet (50 mcg total) by mouth daily before breakfast.  . levothyroxine (SYNTHROID) 75 MCG tablet TAKE 1 TABLET BY MOUTH  DAILY (Patient not taking: Reported on 10/23/2020)  . losartan (COZAAR) 50 MG tablet Take 50 mg by mouth daily.  . magnesium oxide (MAG-OX) 400 MG tablet Take 400 mg by mouth daily.  . metoprolol tartrate (LOPRESSOR) 25 MG tablet TAKE 1 TABLET BY MOUTH  TWICE DAILY (Patient taking differently: Take 25 mg by mouth 2 (two) times daily.)  . Polyethyl Glycol-Propyl Glycol (SYSTANE) 0.4-0.3 % SOLN Place 1 drop into both eyes daily.  . predniSONE (DELTASONE) 10 MG tablet 3 tabs x3 days and then 2 tabs x3 days and then 1 tab x3 days.  Take w/ food.  Marland Kitchen rOPINIRole (REQUIP) 2 MG tablet Take 1 tablet (2 mg total) by mouth at bedtime.  . traMADol (ULTRAM) 50 MG tablet Take 50 mg by mouth every 6 (six) hours as needed for moderate pain or severe pain.   No facility-administered encounter medications on file as of 10/29/2020.   Reviewed chart prior to disease state call. Spoke with patient regarding BP  Recent Office Vitals: BP Readings from Last 3 Encounters:  10/17/20 118/70  08/02/20 (!) 125/59  05/23/20 118/62   Pulse Readings from Last 3 Encounters:  10/17/20 70  08/02/20 72  05/23/20 (!) 58    Wt  Readings from Last 3 Encounters:  10/17/20 154 lb (69.9 kg)  08/02/20 148 lb (67.1 kg)  04/16/20 149 lb (67.6 kg)     Kidney Function Lab Results  Component Value Date/Time   CREATININE 0.85 10/17/2020 09:37 AM   CREATININE 0.88 08/02/2020 07:00 AM   GFR 67.34 10/17/2020 09:37 AM   GFRNONAA >60 08/02/2020 07:00 AM   GFRAA >60 10/08/2019 11:44 AM    BMP Latest Ref Rng & Units 10/17/2020 08/02/2020 01/31/2020  Glucose 70 - 99 mg/dL 91 84 86(V)  BUN 6 - 23 mg/dL 16 18 78(I)  Creatinine 0.40 - 1.20 mg/dL 6.96  2.95 2.84  Sodium 135 - 145 mEq/L 138 138 140  Potassium 3.5 - 5.1 mEq/L 4.0 4.0 4.0  Chloride 96 - 112 mEq/L 102 105 103  CO2 19 - 32 mEq/L 28 23 30   Calcium 8.4 - 10.5 mg/dL 9.2 ) 9.2   I spoke to patient about her recent episode of bronchitis.  She has 2 days left of Prednisone and has finished her antibiotics for her UTI.  She continues with a cough and hoarseness, which is worse at night.  She uses her albuterol only when she has to because she stated it makes her heart race and she does not like how that feels.  She continues to take Mucinex DM in the mornings and Tessalon pearls in the afternoon when her cough worsens. Patient also stated she received a discount card from [facility] for Eliquis, but says it will cost her $1600 for 180 pills, which is more expensive than her $45 copay with her insurance.  She stated that she will continue to use her insurance to fill Eliquis even though she pays $240.00 when her prescription drug balance runs out.   Current antihypertensive regimen: Losartan 50 mg tablet daily  Metoprolol 25 mg tablet twice daily   How often are you checking your Blood Pressure? Patient stated she does not check her b/p regularly.She has an arm cuff at home but does not use it because she feels her b/p is always the same as when it is taken in the PCP office.     Current home BP readings:  Patient did not have any to share.   What recent interventions/DTPs have been made by any provider to improve Blood Pressure control since last CPP Visit:  None noted.   Any recent hospitalizations or ED visits since last visit with CPP? No  What diet changes have been made to improve Blood Pressure Control?  None reported.   What exercise is being done to improve your Blood Pressure Control?  Patient stated she is very active.  She has a vegetable garden and flower garden which she was tending to when I called..  Patient also reports she does a lot of yard work  like putting out pine needles, etc.   Adherence Review: Is the patient currently on ACE/ARB medication? Yes Does the patient have >5 day gap between last estimated fill dates? No Metoprolol 25 mg  last filled 08/29/20  90 DS  Star Rating Drugs: Losartan 50mg  last filled 10/12/20 90 DS  CPA, CMA

## 2020-10-31 ENCOUNTER — Telehealth: Payer: Self-pay

## 2020-10-31 NOTE — Telephone Encounter (Signed)
Patient states she was seen virtually by Dr. Beverely Low on 5/3.    Called this afternoon stating she has not seen any improvement.    Has cough and congestion.    Patient has not completed a COVID test.  I have scheduled patient for a my chart visit for tomorrow 11/01/20.  If this needs to be changed to an in office, please follow up with patient in regard.

## 2020-10-31 NOTE — Telephone Encounter (Signed)
error 

## 2020-11-01 ENCOUNTER — Telehealth (INDEPENDENT_AMBULATORY_CARE_PROVIDER_SITE_OTHER): Payer: Medicare Other | Admitting: Family Medicine

## 2020-11-01 ENCOUNTER — Ambulatory Visit (INDEPENDENT_AMBULATORY_CARE_PROVIDER_SITE_OTHER)
Admission: RE | Admit: 2020-11-01 | Discharge: 2020-11-01 | Disposition: A | Discharge status: Home / Self Care | Payer: Medicare Other | Source: Ambulatory Visit | Attending: Family Medicine | Admitting: Family Medicine

## 2020-11-01 ENCOUNTER — Other Ambulatory Visit: Payer: Self-pay

## 2020-11-01 ENCOUNTER — Encounter: Payer: Self-pay | Admitting: Family Medicine

## 2020-11-01 VITALS — Temp 97.6°F

## 2020-11-01 DIAGNOSIS — J209 Acute bronchitis, unspecified: Secondary | ICD-10-CM

## 2020-11-01 DIAGNOSIS — R059 Cough, unspecified: Secondary | ICD-10-CM | POA: Diagnosis not present

## 2020-11-01 MED ORDER — AZITHROMYCIN 250 MG PO TABS: ORAL_TABLET | ORAL | 0 refills | Status: DC

## 2020-11-01 MED ORDER — GUAIFENESIN-CODEINE 100-10 MG/5ML PO SYRP: 10.0000 mL | ORAL_SOLUTION | Freq: Three times a day (TID) | ORAL | 0 refills | Status: DC | PRN

## 2020-11-01 MED ORDER — PREDNISONE 10 MG PO TABS: ORAL_TABLET | ORAL | 0 refills | Status: DC

## 2020-11-01 NOTE — Progress Notes (Signed)
I connected with  Melissa Jordan on 11/01/20 by a video enabled telemedicine application and verified that I am speaking with the correct person using two identifiers.   I discussed the limitations of evaluation and management by telemedicine. The patient expressed understanding and agreed to proceed.

## 2020-11-01 NOTE — Telephone Encounter (Signed)
Please ask pt to do home COVID test this morning so we have more information at the time of our visit

## 2020-11-01 NOTE — Telephone Encounter (Signed)
Left a vm message with pcp recommendations. 

## 2020-11-01 NOTE — Progress Notes (Signed)
Virtual Visit via Video   I connected with patient on 11/01/20 at 11:00 AM EDT by a video enabled telemedicine application and verified that I am speaking with the correct person using two identifiers.  Location patient: Home Location provider: [facility] [facility] Persons participating in the virtual visit: Patient, Provider, CMA (Sabrina M)  I discussed the limitations of evaluation and management by telemedicine and the availability of in person appointments. The patient expressed understanding and agreed to proceed.  Subjective:   HPI:   Cough- pt was seen on 5/3 for similar and was prescribed albuterol and prednisone.  Pt reports Prednisone 'helped some' but this cough just won't go away.  Cough is mostly dry but it will be productive in the AM after taking Mucinex.  Unable to lie down due to cough.  Sleeping in a chair.  Codeine cough syrup helped some at night but she is now out of medication.  Pt is 'worn out'.  No fevers.  Home COVID test this AM negative.  ROS:   See pertinent positives and negatives per HPI.  Patient Active Problem List   Diagnosis Date Noted  . Spinal cord stimulator status 10/04/2020  . Elevated blood-pressure reading, without diagnosis of hypertension 08/07/2020  . Opioid dependence (HCC) 05/14/2020  . Chronic pain syndrome 02/15/2020  . History of lumbosacral spine surgery 02/15/2020  . 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 Sacroiliitis (HCC) 08/09/2019  . Body mass index (BMI) 27.0-27.9, adult 05/17/2019  . 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 Traumatic arthritis of right ankle   . Pain in right ankle and joints of right foot 08/05/2018  . A-fib (HCC) 03/10/2017  . 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 RLS (restless legs syndrome) 01/11/2015  . Osteopenia 05/15/2014  . Hyperlipidemia 05/24/2013  . Routine general medical examination at a health care facility 03/29/2012  . POSTMENOPAUSAL SYNDROME 09/26/2009  .  URINARY URGENCY 09/26/2009  . ANEMIA 01/09/2009  . Back pain of lumbar region with sciatica 01/09/2009  . LAMINECTOMY, LUMBAR, HX OF 08/09/2008  . Hypothyroid 03/03/2007  . HYPERTENSION, BENIGN 03/03/2007  . GERD 02/10/2007    Social History   Tobacco Use  . Smoking status: Never Smoker  . Smokeless tobacco: Never Used  Substance Use Topics  . Alcohol use: No    Current Outpatient Medications:  .  acetaminophen (TYLENOL) 500 MG tablet, Take 1,000 mg by mouth every 6 (six) hours as needed for moderate pain. , Disp: , Rfl:  .  albuterol (VENTOLIN HFA) 108 (90 Base) MCG/ACT inhaler, Inhale 2 puffs into the lungs every 6 (six) hours as needed for wheezing or shortness of breath., Disp: 8 g, Rfl: 0 .  Biotin 91478 MCG TABS, Take 10,000 mcg by mouth daily., Disp: , Rfl:  .  CALCIUM-VITAMIN D PO, Take 1 tablet by mouth daily. , Disp: , Rfl:  .  Cholecalciferol (VITAMIN D) 125 MCG (5000 UT) CAPS, Take 5,000 Units by mouth daily., Disp: , Rfl:  .  ELIQUIS 5 MG TABS tablet, Take 1 tablet by mouth twice daily (Patient taking differently: Take 5 mg by mouth 2 (two) times daily.), Disp: 180 tablet, Rfl: 1 .  estradiol (ESTRACE) 1 MG tablet, Take 1 tablet by mouth once daily, Disp: 90 tablet, Rfl: 0 .  fluticasone (FLONASE) 50 MCG/ACT nasal spray, Place 2 sprays into both nostrils daily as needed for rhinitis or allergies., Disp: , Rfl:  .  levothyroxine (SYNTHROID) 50 MCG tablet, Take 1 tablet (50  mcg total) by mouth daily before breakfast., Disp: 90 tablet, Rfl: 0 .  losartan (COZAAR) 50 MG tablet, Take 50 mg by mouth daily., Disp: , Rfl:  .  magnesium oxide (MAG-OX) 400 MG tablet, Take 400 mg by mouth daily., Disp: , Rfl:  .  metoprolol tartrate (LOPRESSOR) 25 MG tablet, TAKE 1 TABLET BY MOUTH  TWICE DAILY (Patient taking differently: Take 25 mg by mouth 2 (two) times daily.), Disp: 180 tablet, Rfl: 3 .  Polyethyl Glycol-Propyl Glycol (SYSTANE) 0.4-0.3 % SOLN, Place 1 drop into both eyes daily.,  Disp: , Rfl:  .  rOPINIRole (REQUIP) 2 MG tablet, Take 1 tablet (2 mg total) by mouth at bedtime., Disp: 90 tablet, Rfl: 1 .  traMADol (ULTRAM) 50 MG tablet, Take 50 mg by mouth every 6 (six) hours as needed for moderate pain or severe pain., Disp: , Rfl:  .  cephALEXin (KEFLEX) 500 MG capsule, Take 1 capsule (500 mg total) by mouth 2 (two) times daily. (Patient not taking: Reported on 11/01/2020), Disp: 10 capsule, Rfl: 0 .  guaiFENesin-codeine (ROBITUSSIN AC) 100-10 MG/5ML syrup, Take 10 mLs by mouth 3 (three) times daily as needed for cough. (Patient not taking: Reported on 11/01/2020), Disp: 120 mL, Rfl: 0 .  levothyroxine (SYNTHROID) 75 MCG tablet, TAKE 1 TABLET BY MOUTH  DAILY (Patient not taking: No sig reported), Disp: 90 tablet, Rfl: 3 .  predniSONE (DELTASONE) 10 MG tablet, 3 tabs x3 days and then 2 tabs x3 days and then 1 tab x3 days.  Take w/ food. (Patient not taking: Reported on 11/01/2020), Disp: 18 tablet, Rfl: 0  Allergies  Allergen Reactions  . Lyrica [Pregabalin] Palpitations and Other (See Comments)    "thought I was going to have a heart attack; heart started pounding so hard, I couldn't catch my breath" (06/28/2012)  . Meclizine Other (See Comments)    "what was in my mind to say wasn't what came out to say; I was kind of in another world" (06/28/2012)  . Penicillins Itching and Rash    Has patient had a PCN reaction causing immediate rash, facial/tongue/throat swelling, SOB or lightheadedness with hypotension: No Has patient had a PCN reaction causing severe rash involving mucus membranes or skin necrosis: No Has patient had a PCN reaction that required hospitalization No Has patient had a PCN reaction occurring within the last 10 years:   # # YES # #  If all of the above answers are "NO", then may proceed with Cephalosporin use.   . Poison Sumac Extract Swelling, Rash and Other (See Comments)    Blisters  . Acetaminophen   . Propoxyphene   . Bactrim  [Sulfamethoxazole-Trimethoprim] Rash  . Codeine Nausea Only  . Morphine Sulfate Rash  . Propoxyphene N-Acetaminophen Nausea And Vomiting    "Darvocet"  . Vancomycin Rash    Objective:   There were no vitals taken for this visit. AAOx3, NAD NCAT, EOMI No obvious CN deficits Coloring WNL Pt is able to speak clearly, coherently without shortness of breath or increased work of breathing.  + dry, hacking cough Thought process is linear.  Mood is appropriate.   Assessment and Plan:   Bronchitis- pt has been dealing w/ cough for multiple weeks w/ little improvement even after Prednisone and Albuterol.  Will get CXR to assess, start Zpack, and repeat Prednisone.  Reviewed supportive care and red flags that should prompt return.  Pt expressed understanding and is in agreement w/ plan.    Neena Rhymes, MD  11/01/2020  

## 2020-11-10 ENCOUNTER — Other Ambulatory Visit: Payer: Self-pay | Admitting: Family Medicine

## 2020-11-22 ENCOUNTER — Other Ambulatory Visit (INDEPENDENT_AMBULATORY_CARE_PROVIDER_SITE_OTHER): Payer: Medicare Other

## 2020-11-22 ENCOUNTER — Encounter: Payer: Self-pay | Admitting: Medical

## 2020-11-22 ENCOUNTER — Other Ambulatory Visit: Payer: Self-pay

## 2020-11-22 ENCOUNTER — Telehealth: Payer: Self-pay | Admitting: *Deleted

## 2020-11-22 ENCOUNTER — Ambulatory Visit: Payer: Medicare Other | Admitting: Medical

## 2020-11-22 VITALS — BP 120/60 | HR 75 | Ht 63.0 in | Wt 155.6 lb

## 2020-11-22 DIAGNOSIS — I10 Essential (primary) hypertension: Secondary | ICD-10-CM

## 2020-11-22 DIAGNOSIS — E039 Hypothyroidism, unspecified: Secondary | ICD-10-CM | POA: Diagnosis not present

## 2020-11-22 DIAGNOSIS — I480 Paroxysmal atrial fibrillation: Secondary | ICD-10-CM

## 2020-11-22 DIAGNOSIS — Z01810 Encounter for preprocedural cardiovascular examination: Secondary | ICD-10-CM | POA: Diagnosis not present

## 2020-11-22 LAB — T3, FREE: T3, Free: 7.6 pg/mL — ABNORMAL HIGH (ref 2.3–4.2)

## 2020-11-22 LAB — TSH: TSH: 1.62 u[IU]/mL (ref 0.35–4.50)

## 2020-11-22 LAB — T4, FREE: Free T4: 1.61 ng/dL — ABNORMAL HIGH (ref 0.60–1.60)

## 2020-11-22 MED ORDER — LOSARTAN POTASSIUM 25 MG PO TABS: 25.0000 mg | ORAL_TABLET | Freq: Every day | ORAL | 3 refills | Status: DC
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
## 2020-11-22 NOTE — Patient Instructions (Addendum)
Medication Instructions:  Decrease Losartan to 25 mg (1 Tablet Daily) *If you need a refill on your cardiac medications before your next appointment, please call your pharmacy*   Lab Work: No labs If you have labs (blood work) drawn today and your tests are completely normal, you will receive your results only by: Marland Kitchen MyChart Message (if you have MyChart) OR . A paper copy in the mail If you have any lab test that is abnormal or we need to change your treatment, we will call you to review the results.   Testing/Procedures: No Testing    Follow-Up: At [facility], you and your health needs are our priority.  As part of our continuing mission to provide you with exceptional heart care, we have created designated Provider Care Teams.  These Care Teams include your primary Cardiologist (physician) and Advanced Practice Providers (APPs -  Physician Assistants and Nurse Practitioners) who all work together to provide you with the care you need, when you need it.      Your next appointment:   6 month(s)  The format for your next appointment:   In Person  Provider:   Epifanio Lesches, MD   Other Instructions We Have sent a Referral To Electrophysiology

## 2020-11-22 NOTE — Telephone Encounter (Signed)
Patient assistance completed and faxed to [facility] for Eliquis

## 2020-11-23 ENCOUNTER — Other Ambulatory Visit: Payer: Self-pay

## 2020-11-23 DIAGNOSIS — R7989 Other specified abnormal findings of blood chemistry: Secondary | ICD-10-CM

## 2020-11-23 MED ORDER — LEVOTHYROXINE SODIUM 25 MCG PO TABS: 25.0000 ug | ORAL_TABLET | Freq: Every day | ORAL | 3 refills | Status: DC

## 2020-11-28 DIAGNOSIS — M961 Postlaminectomy syndrome, not elsewhere classified: Secondary | ICD-10-CM | POA: Diagnosis not present

## 2020-11-28 DIAGNOSIS — Z5309 Procedure and treatment not carried out because of other contraindication: Secondary | ICD-10-CM | POA: Diagnosis not present

## 2020-12-04 ENCOUNTER — Other Ambulatory Visit (HOSPITAL_BASED_OUTPATIENT_CLINIC_OR_DEPARTMENT_OTHER): Payer: Self-pay | Admitting: Neurosurgery

## 2020-12-04 DIAGNOSIS — Z9689 Presence of other specified functional implants: Secondary | ICD-10-CM

## 2020-12-13 ENCOUNTER — Other Ambulatory Visit: Payer: Self-pay | Admitting: Family Medicine

## 2020-12-14 ENCOUNTER — Telehealth: Payer: Self-pay

## 2020-12-14 ENCOUNTER — Other Ambulatory Visit: Payer: Self-pay | Admitting: Family Medicine

## 2020-12-14 DIAGNOSIS — G2581 Restless legs syndrome: Secondary | ICD-10-CM

## 2020-12-14 MED ORDER — ELIQUIS 5 MG PO TABS: 5.0000 mg | ORAL_TABLET | Freq: Two times a day (BID) | ORAL | 1 refills | Status: DC

## 2020-12-14 NOTE — Telephone Encounter (Signed)
75yo Female Dx Atrial fibrillation Last OV with K Krieger on 11/22/2020 Scr = 0.85 on 10/17/2020 Wt 70.6kg

## 2020-12-14 NOTE — Addendum Note (Signed)
Addended by: Pearletha Furl on: 12/14/2020 03:27 PM   Modules accepted: Orders

## 2020-12-17 ENCOUNTER — Encounter: Payer: Self-pay | Admitting: Family Medicine

## 2020-12-17 ENCOUNTER — Telehealth (INDEPENDENT_AMBULATORY_CARE_PROVIDER_SITE_OTHER): Payer: Medicare Other | Admitting: Family Medicine

## 2020-12-17 VITALS — Temp 102.0°F

## 2020-12-17 DIAGNOSIS — U071 COVID-19: Secondary | ICD-10-CM | POA: Diagnosis not present

## 2020-12-17 MED ORDER — MOLNUPIRAVIR EUA 200MG CAPSULE: 4.0000 | ORAL_CAPSULE | Freq: Two times a day (BID) | ORAL | 0 refills | Status: AC

## 2020-12-17 NOTE — Progress Notes (Signed)
Virtual Visit via Video   I connected with patient on 12/17/20 at  9:30 AM EDT by a video enabled telemedicine application and verified that I am speaking with the correct person using two identifiers.  Location patient: Home Location provider: [place] [facility], Office Persons participating in the virtual visit: Patient, Provider, CMA (Sabrina M)  I discussed the limitations of evaluation and management by telemedicine and the availability of in person appointments. The patient expressed understanding and agreed to proceed.  Subjective:   HPI:   COVID- 'i feel horrible'.  Sxs started at 10pm last night w/ chills and fever.  + HA, nasal congestion, 'i just feel bad all over'.  No cough.  + sick contacts- Son.  Pt is desiring antivirals.  ROS:   See pertinent positives and negatives per HPI.  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   Traumatic arthritis of right ankle    Pain in right ankle and joints of right foot 08/05/2018   A-fib (HCC) 03/10/2017   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/2016   RLS (restless legs syndrome) 01/11/2015   Osteopenia 05/15/2014   Hyperlipidemia 05/24/2013   Routine general medical examination at a health care facility 03/29/2012   POSTMENOPAUSAL SYNDROME 09/26/2009   URINARY URGENCY 09/26/2009   ANEMIA 01/09/2009   Back pain of lumbar region with sciatica 01/09/2009   LAMINECTOMY, LUMBAR, HX OF 08/09/2008   Hypothyroid 03/03/2007   HYPERTENSION, BENIGN 03/03/2007   GERD 02/10/2007    Social History   Tobacco  Use   Smoking status: Never   Smokeless tobacco: Never  Substance Use Topics   Alcohol use: No    Current Outpatient Medications:    acetaminophen (TYLENOL) 500 MG tablet, Take 1,000 mg by mouth every 6 (six) hours as needed for moderate pain. , Disp: , Rfl:    albuterol (VENTOLIN HFA) 108 (90 Base) MCG/ACT inhaler, Inhale 2 puffs into the lungs every 6 (six) hours as needed for wheezing or shortness of breath., Disp: 8 g, Rfl: 0   apixaban (ELIQUIS) 5 MG TABS tablet, Take 1 tablet (5 mg total) by mouth 2 (two) times daily., Disp: 180 tablet, Rfl: 1   Biotin 58527 MCG TABS, Take 10,000 mcg by mouth daily., Disp: , Rfl:    CALCIUM-VITAMIN D PO, Take 1 tablet by mouth daily. , Disp: , Rfl:    Cholecalciferol (VITAMIN D) 125 MCG (5000 UT) CAPS, Take 5,000 Units by mouth daily., Disp: , Rfl:    estradiol (ESTRACE) 1 MG tablet, Take 1 tablet by mouth once daily, Disp: 90 tablet, Rfl: 0   fluticasone (FLONASE) 50 MCG/ACT nasal spray, Place 2 sprays into both nostrils daily as needed for rhinitis or allergies., Disp: , Rfl:    levothyroxine (SYNTHROID) 25 MCG tablet, Take 1 tablet (25 mcg total) by mouth daily., Disp: 30 tablet, Rfl: 3   losartan (COZAAR) 25 MG tablet, Take 1 tablet (25 mg total) by mouth daily., Disp: 90 tablet, Rfl: 3   magnesium oxide (MAG-OX) 400 MG tablet, Take 400 mg by mouth daily., Disp: , Rfl:    metoprolol tartrate (LOPRESSOR)  25 MG tablet, TAKE 1 TABLET BY MOUTH  TWICE DAILY (Patient taking differently: Take 25 mg by mouth 2 (two) times daily.), Disp: 180 tablet, Rfl: 3   Polyethyl Glycol-Propyl Glycol (SYSTANE) 0.4-0.3 % SOLN, Place 1 drop into both eyes daily., Disp: , Rfl:    rOPINIRole (REQUIP) 2 MG tablet, Take 1 tablet (2 mg total) by mouth at bedtime., Disp: 90 tablet, Rfl: 1   traMADol (ULTRAM) 50 MG tablet, Take 50 mg by mouth every 6 (six) hours as needed for moderate pain or severe pain., Disp: , Rfl:    guaiFENesin-codeine (ROBITUSSIN AC) 100-10 MG/5ML syrup,  Take 10 mLs by mouth 3 (three) times daily as needed for cough. (Patient not taking: Reported on 12/17/2020), Disp: 120 mL, Rfl: 0   levothyroxine (SYNTHROID) 50 MCG tablet, Take 1 tablet (50 mcg total) by mouth daily before breakfast. (Patient not taking: Reported on 12/17/2020), Disp: 90 tablet, Rfl: 0  Allergies  Allergen Reactions   Lyrica [Pregabalin] Palpitations and Other (See Comments)    "thought I was going to have a heart attack; heart started pounding so hard, I couldn't catch my breath" (06/28/2012)   Meclizine Other (See Comments)    "what was in my mind to say wasn't what came out to say; I was kind of in another world" (06/28/2012)   Penicillins Itching and Rash    Has patient had a PCN reaction causing immediate rash, facial/tongue/throat swelling, SOB or lightheadedness with hypotension: No Has patient had a PCN reaction causing severe rash involving mucus membranes or skin necrosis: No Has patient had a PCN reaction that required hospitalization No Has patient had a PCN reaction occurring within the last 10 years:   # # YES # #  If all of the above answers are "NO", then may proceed with Cephalosporin use.    Poison Sumac Extract Swelling, Rash and Other (See Comments)    Blisters   Acetaminophen    Propoxyphene    Bactrim [Sulfamethoxazole-Trimethoprim] Rash   Codeine Nausea Only   Morphine Sulfate Rash   Propoxyphene N-Acetaminophen Nausea And Vomiting    "Darvocet"   Vancomycin Rash    Objective:   Temp (!) 102 F (38.9 C) (Temporal)  AAOx3, NAD NCAT, EOMI No obvious CN deficits Coloring WNL Audible nasal congestion Pt is able to speak clearly, coherently without shortness of breath or increased work of breathing.  Thought process is linear.  Mood is appropriate.   Assessment and Plan:   COVID- new.  Pt tested + this morning.  Both she and her husband got sick from their son.  Her biggest complaint this AM is HA and body aches.  Since she is on Eliquis, she  is not eligible for Paxlovid.  Will treat w/ Molnupiravir.  Reviewed appropriate use, possible side effects.  Discussed tylenol for headaches and body aches.  Encouraged Vit D, Vit C, Zinc to boost immune system.  Enrolled in home monitoring.  Will follow.   Neena Rhymes, MD 12/17/2020

## 2020-12-17 NOTE — Progress Notes (Signed)
I connected with  Melissa Jordan on 12/17/20 by a video enabled telemedicine application and verified that I am speaking with the correct person using two identifiers.   I discussed the limitations of evaluation and management by telemedicine. The patient expressed understanding and agreed to proceed.

## 2020-12-18 ENCOUNTER — Telehealth: Payer: Self-pay

## 2020-12-18 ENCOUNTER — Encounter (INDEPENDENT_AMBULATORY_CARE_PROVIDER_SITE_OTHER): Payer: Self-pay

## 2020-12-18 ENCOUNTER — Telehealth: Payer: Self-pay | Admitting: Family Medicine

## 2020-12-18 ENCOUNTER — Institutional Professional Consult (permissible substitution): Payer: Medicare Other | Admitting: Cardiology

## 2020-12-18 NOTE — Telephone Encounter (Signed)
Patient states that her cough is productive and she is coughing green sputum. Patient was advise per protocol as follows:   If cough remains the same or better: continue to treat with over the counter medications. Hard candy or cough drops and drinking warm fluids. Adults can also use honey 2 tsp (10 ML) at bedtime.   HONEY IS NOT RECOMMENDED FOR INFANTS UNDER ONE.   If cough is becoming worse even with the use of over the counter medications and patient is not able to sleep at night, cough becomes productive with sputum that maybe yellow or green in color, contact PCP.  Patient will contact her pcp to see if they can send something in for her.

## 2020-12-18 NOTE — Telephone Encounter (Signed)
Called patient to inform.She states she is already taking the mucinex DM. (FYI)

## 2020-12-18 NOTE — Telephone Encounter (Signed)
Unfortunately there's not much to do while we wait for this virus to run its course.  Fluids, rest, cough/congestion meds, tylenol, and the anti-viral medication

## 2020-12-18 NOTE — Telephone Encounter (Signed)
She needs to add Mucinex DM to thin her secretions and help suppress her cough.  And lots of fluids

## 2020-12-18 NOTE — Telephone Encounter (Signed)
Patient called and said that the COVID nurse told her that she should call and let Dr. Beverely Low know about her cough and the thick secretions of mucous.  Please advise

## 2020-12-18 NOTE — Telephone Encounter (Signed)
Called patient with pcp recommendations. Patient voiced understanding. 

## 2020-12-19 ENCOUNTER — Other Ambulatory Visit: Payer: Self-pay

## 2020-12-19 ENCOUNTER — Encounter: Payer: Self-pay | Admitting: *Deleted

## 2020-12-19 ENCOUNTER — Telehealth: Payer: Self-pay

## 2020-12-19 DIAGNOSIS — M8580 Other specified disorders of bone density and structure, unspecified site: Secondary | ICD-10-CM

## 2020-12-19 MED ORDER — ESTRADIOL 1 MG PO TABS
1.0000 mg | ORAL_TABLET | Freq: Every day | ORAL | 0 refills | Status: DC
Start: 2020-12-19 — End: 2021-03-18

## 2020-12-19 NOTE — Telephone Encounter (Signed)
Pt. Reports her cough is "about the same. The Mucinex is helping." States "secretions are still thick." Reports she has had 3 loose stools today. * You should be able to treat this at home.    2: REASSURANCE AND EDUCATION - DIARRHEA:  * Diarrhea may caused by a virus ('stomach flu') or a bacteria. Diarrhea is one of the body's way of getting rid of germs.  * Certain foods (e.g., dairy products, supplements like Ensure) can also trigger diarrhea.  * In some people, the exact cause is never found.  * Staying well-hydrated is the most important thing if you have diarrhea. From what you have told me, it sounds like you are not severely dehydrated at this point.  * Here is some general care advice that should help.   3: FLUID THERAPY DURING MILD TO MODERATE DIARRHEA:  * Drink more fluids, at least 8 to 10 cups daily. One cup equals 8 oz (240 ml).  * WATER: For mild to moderate diarrhea, water is often the best liquid to drink. You should also eat some salty foods (e.g., potato chips, pretzels, saltine crackers). This is important to make sure you are getting enough salt, sugars, and fluids to meet your body's needs.  * SPORTS DRINKS: You can also drink half-strength sports drinks (e.g., Gatorade, Powerade) to help treat and prevent dehydration. Mix the sports drink half and half with water.  * Avoid caffeinated beverages. Reason: Caffeine is mildly dehydrating.  * Avoid alcohol beverages (e.g., beer, wine, hard liquor).  * Avoid carbonated soft drinks (soda) as these can make your diarrhea worse.   4: FOOD AND NUTRITION DURING MILD TO MODERATE DIARRHEA:  * Maintaining some food intake during episodes of diarrhea is important.  * Begin with boiled starches / cereals (e.g., potatoes, rice, noodles, wheat, oats) with a small amount of salt to taste.  * You can also eat bananas, yogurt, crackers, soup.  * Eat smaller meals and snacks more often during the day rather than 3 larger meals.  * As the diarrhea  starts to get better, you can slowly return to a normal diet.  * AVOID milk and dairy products if these make your diarrhea worse.  * AVOID greasy, fatty or spicy foods.   5: DIARRHEA MEDICINE - LOPERAMIDE (IMODIUM AD):  * This medicine helps decrease diarrhea. It is available over-the-counter (OTC) in a drugstore.  * Adult dosage: 4 mg (2 capsules) is the recommended first dose. You may take an additional 2 mg (1 capsule) after each loose stool.  * Maximum dosage: 16 mg per day (8 capsules).  * Do not use for more than 2 days.   6: DIARRHEA MEDICINE - LOPERAMIDE - EXTRA NOTES AND WARNINGS:  * DO NOT use if there is a fever over 100.4 F (38.0 C) or if there is blood or mucus in the stools.  * DO NOT drink tonic water. It can interact with loperamide and may cause a serious heart problems.  * Before taking any medicine, read all the instructions on the package.   7: DIARRHEA MEDICINE - BISMUTH SUBSALICYLATE (E.G., KAOPECTATE, PEPTO-BISMOL):  * This medicine can help reduce diarrhea, vomiting, and abdominal cramping. It is available over-the-counter (OTC) in a drugstore.  * Adult dosage: Take two tablets or two tablespoons by mouth every hour (if diarrhea continues) to a maximum of 8 doses in a 24 hour period.

## 2020-12-19 NOTE — Telephone Encounter (Signed)
Noted. No further advice at this time.

## 2020-12-20 ENCOUNTER — Other Ambulatory Visit (HOSPITAL_COMMUNITY): Payer: Self-pay | Admitting: Neurosurgery

## 2020-12-20 DIAGNOSIS — Z9689 Presence of other specified functional implants: Secondary | ICD-10-CM

## 2020-12-21 ENCOUNTER — Telehealth: Payer: Self-pay | Admitting: *Deleted

## 2020-12-21 NOTE — Telephone Encounter (Signed)
Call to patient- worsening symptoms reported:  Weakness: Patient reports weakness walking from one end of the house to the other. Patient advised to increase fluids/solids, proper rest and time. If she has worsening weakness with inability to stand or if patient has to hold on to something to get balance, seek treatment in ED- call 911.  No Appetite: Patient reports nausea is so bad hard to drink or eat anything. Patient reports no vomiting since yesterday. Advised call PCP for medication to help calm system so she can eat and drink. Advised small sips and bites as tolerated - building to bland solid food such as crackers, bread, soup or applesauce. As advised- if unable to tolerate and foods or liquids- notify provider.

## 2020-12-26 ENCOUNTER — Other Ambulatory Visit: Payer: Medicare Other

## 2020-12-27 ENCOUNTER — Telehealth: Payer: Self-pay | Admitting: *Deleted

## 2020-12-27 NOTE — Telephone Encounter (Signed)
Attempted to call patient: reported worsening symptom: SOB. Left message to call back to discuss changes in symptom reported.  Shortness of breath: If symptoms become severe,ie. Shortness of breath at rest, gasping for air, wheezing- Call 911 and seek treatment in the ED.

## 2021-01-02 ENCOUNTER — Other Ambulatory Visit (INDEPENDENT_AMBULATORY_CARE_PROVIDER_SITE_OTHER): Payer: Medicare Other

## 2021-01-02 ENCOUNTER — Other Ambulatory Visit: Payer: Self-pay

## 2021-01-02 ENCOUNTER — Encounter (HOSPITAL_COMMUNITY): Payer: Self-pay | Admitting: *Deleted

## 2021-01-02 DIAGNOSIS — R7989 Other specified abnormal findings of blood chemistry: Secondary | ICD-10-CM

## 2021-01-02 LAB — T3, FREE: T3, Free: 8.8 pg/mL — ABNORMAL HIGH (ref 2.3–4.2)

## 2021-01-02 LAB — T4, FREE: Free T4: 1.67 ng/dL — ABNORMAL HIGH (ref 0.60–1.60)

## 2021-01-02 LAB — TSH: TSH: 3.72 u[IU]/mL (ref 0.35–5.50)

## 2021-01-02 NOTE — Progress Notes (Signed)
DUE TO COVID-19 ONLY ONE VISITOR IS ALLOWED TO COME WITH YOU AND STAY IN THE WAITING ROOM ONLY DURING PRE OP AND PROCEDURE DAY OF SURGERY.   PCP - Dr Neena Rhymes Cardiologist - Dr Daryl Eastern, PA-C Neurology - Rulon Abide, NP  Chest x-ray - 11/01/20 (2V) EKG - 11/22/20 Stress Test - 03/20/17 ECHO - 07/05/19 Cardiac Cath - n/a  Sleep Study -  n/a CPAP - none  Anesthesia review: Yes  Coronavirus Screening Covid test n/a - Ambulatory Surgery  Do you have any of the following symptoms:  Cough yes/no: No Fever (>100.17F)  yes/no: No Runny nose yes/no: No Sore throat yes/no: No Difficulty breathing/shortness of breath  yes/no: No  Have you traveled in the last 14 days and where? yes/no: No  Patient verbalized understanding of instructions that were given via phone.
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
## 2021-01-03 ENCOUNTER — Ambulatory Visit (HOSPITAL_COMMUNITY): Payer: Medicare Other | Admitting: Vascular Surgery

## 2021-01-03 ENCOUNTER — Encounter (HOSPITAL_COMMUNITY): Admission: RE | Disposition: A | Discharge status: Home / Self Care | Payer: Self-pay | Source: Ambulatory Visit

## 2021-01-03 ENCOUNTER — Ambulatory Visit (HOSPITAL_COMMUNITY)
Admission: RE | Admit: 2021-01-03 | Discharge: 2021-01-03 | Disposition: A | Discharge status: Home / Self Care | Payer: Medicare Other | Source: Ambulatory Visit | Attending: Neurosurgery | Admitting: Neurosurgery

## 2021-01-03 ENCOUNTER — Encounter (HOSPITAL_COMMUNITY): Payer: Self-pay

## 2021-01-03 DIAGNOSIS — E039 Hypothyroidism, unspecified: Secondary | ICD-10-CM | POA: Diagnosis not present

## 2021-01-03 DIAGNOSIS — Z8616 Personal history of COVID-19: Secondary | ICD-10-CM | POA: Diagnosis not present

## 2021-01-03 DIAGNOSIS — Z79899 Other long term (current) drug therapy: Secondary | ICD-10-CM | POA: Insufficient documentation

## 2021-01-03 DIAGNOSIS — Z96653 Presence of artificial knee joint, bilateral: Secondary | ICD-10-CM | POA: Diagnosis not present

## 2021-01-03 DIAGNOSIS — Z4542 Encounter for adjustment and management of neuropacemaker (brain) (peripheral nerve) (spinal cord): Secondary | ICD-10-CM | POA: Diagnosis not present

## 2021-01-03 DIAGNOSIS — G8929 Other chronic pain: Secondary | ICD-10-CM | POA: Diagnosis not present

## 2021-01-03 DIAGNOSIS — Z9689 Presence of other specified functional implants: Secondary | ICD-10-CM

## 2021-01-03 DIAGNOSIS — I480 Paroxysmal atrial fibrillation: Secondary | ICD-10-CM | POA: Insufficient documentation

## 2021-01-03 DIAGNOSIS — Z79891 Long term (current) use of opiate analgesic: Secondary | ICD-10-CM | POA: Insufficient documentation

## 2021-01-03 DIAGNOSIS — Z981 Arthrodesis status: Secondary | ICD-10-CM | POA: Diagnosis not present

## 2021-01-03 DIAGNOSIS — Z7901 Long term (current) use of anticoagulants: Secondary | ICD-10-CM | POA: Insufficient documentation

## 2021-01-03 DIAGNOSIS — Z01818 Encounter for other preprocedural examination: Secondary | ICD-10-CM | POA: Diagnosis not present

## 2021-01-03 DIAGNOSIS — Z79890 Hormone replacement therapy (postmenopausal): Secondary | ICD-10-CM | POA: Insufficient documentation

## 2021-01-03 DIAGNOSIS — M40204 Unspecified kyphosis, thoracic region: Secondary | ICD-10-CM | POA: Diagnosis not present

## 2021-01-03 DIAGNOSIS — I10 Essential (primary) hypertension: Secondary | ICD-10-CM | POA: Insufficient documentation

## 2021-01-03 DIAGNOSIS — M4314 Spondylolisthesis, thoracic region: Secondary | ICD-10-CM | POA: Diagnosis not present

## 2021-01-03 DIAGNOSIS — K219 Gastro-esophageal reflux disease without esophagitis: Secondary | ICD-10-CM | POA: Diagnosis not present

## 2021-01-03 HISTORY — DX: Bronchitis, not specified as acute or chronic: J40

## 2021-01-03 HISTORY — PX: RADIOLOGY WITH ANESTHESIA: SHX6223

## 2021-01-03 LAB — BASIC METABOLIC PANEL
Anion gap: 5 (ref 5–15)
BUN: 24 mg/dL — ABNORMAL HIGH (ref 8–23)
CO2: 27 mmol/L (ref 22–32)
Calcium: 8.8 mg/dL — ABNORMAL LOW (ref 8.9–10.3)
Chloride: 106 mmol/L (ref 98–111)
Creatinine, Ser: 0.84 mg/dL (ref 0.44–1.00)
GFR, Estimated: >60 mL/min (ref >60)
Glucose, Bld: 88 mg/dL (ref 70–99)
Potassium: 3.9 mmol/L (ref 3.5–5.1)
Sodium: 138 mmol/L (ref 135–145)

## 2021-01-03 SURGERY — MRI WITH ANESTHESIA
Anesthesia: General

## 2021-01-03 MED ORDER — CHLORHEXIDINE GLUCONATE 0.12 % MT SOLN
15.0000 mL | Freq: Once | OROMUCOSAL | Status: AC
  Administered 2021-01-03: 15 mL via OROMUCOSAL
  Filled 2021-01-03: qty 15

## 2021-01-03 MED ORDER — ONDANSETRON HCL 4 MG/2ML IJ SOLN: 4.0000 mg | Freq: Once | INTRAMUSCULAR | Status: DC | PRN

## 2021-01-03 MED ORDER — ORAL CARE MOUTH RINSE: 15.0000 mL | Freq: Once | OROMUCOSAL | Status: AC

## 2021-01-03 MED ORDER — ONDANSETRON HCL 4 MG/2ML IJ SOLN
INTRAMUSCULAR | Status: DC | PRN
  Administered 2021-01-03: 4 mg via INTRAVENOUS

## 2021-01-03 MED ORDER — LIDOCAINE 2% (20 MG/ML) 5 ML SYRINGE
INTRAMUSCULAR | Status: DC | PRN
  Administered 2021-01-03: 60 mg via INTRAVENOUS

## 2021-01-03 MED ORDER — LACTATED RINGERS IV SOLN: INTRAVENOUS | Status: DC

## 2021-01-03 MED ORDER — PHENYLEPHRINE HCL-NACL 10-0.9 MG/250ML-% IV SOLN
INTRAVENOUS | Status: DC | PRN
  Administered 2021-01-03: 25 ug/min via INTRAVENOUS

## 2021-01-03 MED ORDER — PROPOFOL 10 MG/ML IV BOLUS
INTRAVENOUS | Status: DC | PRN
  Administered 2021-01-03: 150 mg via INTRAVENOUS

## 2021-01-03 MED ORDER — AMISULPRIDE (ANTIEMETIC) 5 MG/2ML IV SOLN: 10.0000 mg | Freq: Once | INTRAVENOUS | Status: DC | PRN

## 2021-01-03 MED ORDER — DEXAMETHASONE SODIUM PHOSPHATE 10 MG/ML IJ SOLN
INTRAMUSCULAR | Status: DC | PRN
  Administered 2021-01-03: 4 mg via INTRAVENOUS

## 2021-01-03 NOTE — Interval H&P Note (Signed)
Anesthesia H&P Update: History and Physical Exam reviewed; patient is OK for planned anesthetic and procedure. ? ?

## 2021-01-03 NOTE — Anesthesia Procedure Notes (Signed)
Procedure Name: LMA Insertion Date/Time: 01/03/2021 8:12 AM Performed by: Adria Dill, CRNA Pre-anesthesia Checklist: Patient identified, Timeout performed, Emergency Drugs available and Suction available Patient Re-evaluated:Patient Re-evaluated prior to induction Oxygen Delivery Method: Circle system utilized Preoxygenation: Pre-oxygenation with 100% oxygen Induction Type: IV induction Ventilation: Mask ventilation without difficulty LMA: LMA with gastric port inserted LMA Size: 4.0 Grade View: Grade I Tube type: Oral Number of attempts: 1 Airway Equipment and Method: Stylet Placement Confirmation: positive ETCO2 and breath sounds checked- equal and bilateral Tube secured with: Tape Dental Injury: Teeth and Oropharynx as per pre-operative assessment

## 2021-01-03 NOTE — Anesthesia Postprocedure Evaluation (Signed)
Anesthesia Post Note  Patient: Melissa Jordan  Procedure(s) Performed: MRI WITH ANESTHESIA OF THORASIC SPINE WITHOUT CONTRAST     Patient location during evaluation: PACU Anesthesia Type: General Level of consciousness: awake and alert Pain management: pain level controlled Vital Signs Assessment: post-procedure vital signs reviewed and stable Respiratory status: spontaneous breathing, nonlabored ventilation, respiratory function stable and patient connected to nasal cannula oxygen Cardiovascular status: blood pressure returned to baseline and stable Postop Assessment: no apparent nausea or vomiting Anesthetic complications: no   No notable events documented.  Last Vitals:  Vitals:   01/03/21 0915 01/03/21 0930  BP: 130/68 125/76  Pulse: 68 71  Resp: 12 16  Temp:  (!) 36.3 C  SpO2: 100% 99%    Last Pain:  Vitals:   01/03/21 0930  PainSc: 0-No pain                 Shelton Silvas

## 2021-01-03 NOTE — Transfer of Care (Signed)
Immediate Anesthesia Transfer of Care Note  Patient: Melissa Jordan  Procedure(s) Performed: MRI WITH ANESTHESIA OF [employer] SPINE WITHOUT CONTRAST  Patient Location: PACU  Anesthesia Type:General  Level of Consciousness: awake and patient cooperative  Airway & Oxygen Therapy: Patient Spontanous Breathing  Post-op Assessment: Report given to RN and Post -op Vital signs reviewed and stable  Post vital signs: Reviewed and stable  Last Vitals:  Vitals Value Taken Time  BP 136/67 01/03/21 0902  Temp    Pulse 44 01/03/21 0904  Resp 17 01/03/21 0904  SpO2 98 % 01/03/21 0904  Vitals shown include unvalidated device data.  Last Pain:  Vitals:   01/03/21 0652  PainSc: 0-No pain         Complications: No notable events documented.

## 2021-01-03 NOTE — Anesthesia Preprocedure Evaluation (Addendum)
Anesthesia Evaluation  Patient identified by MRN, date of birth, ID band Patient awake    Reviewed: Allergy & Precautions, NPO status , Patient's Chart, lab work & pertinent test results, reviewed documented beta blocker date and time   History of Anesthesia Complications (+) PONV and history of anesthetic complications  Airway Mallampati: II  TM Distance: >3 FB Neck ROM: Full    Dental  (+) Teeth Intact, Dental Advisory Given,    Pulmonary asthma ,    breath sounds clear to auscultation       Cardiovascular hypertension, Pt. on home beta blockers and Pt. on medications + dysrhythmias Atrial Fibrillation  Rhythm:Regular Rate:Normal     Neuro/Psych PSYCHIATRIC DISORDERS (claustrophobia) Anxiety  Neuromuscular disease    GI/Hepatic Neg liver ROS, GERD  Controlled and Medicated,  Endo/Other  Hypothyroidism   Renal/GU negative Renal ROS  negative genitourinary   Musculoskeletal  (+) Arthritis , Osteoarthritis,    Abdominal Normal abdominal exam  (+)   Peds  Hematology  (+) Blood dyscrasia, anemia ,   Anesthesia Other Findings   Reproductive/Obstetrics negative OB ROS                            Anesthesia Physical  Anesthesia Plan  ASA: 3  Anesthesia Plan: General   Post-op Pain Management:    Induction: Intravenous  PONV Risk Score and Plan: 4 or greater and Ondansetron, Dexamethasone, Treatment may vary due to age or medical condition and Midazolam  Airway Management Planned: LMA  Additional Equipment: None  Intra-op Plan:   Post-operative Plan: Extubation in OR  Informed Consent: I have reviewed the patients History and Physical, chart, labs and discussed the procedure including the risks, benefits and alternatives for the proposed anesthesia with the patient or authorized representative who has indicated his/her understanding and acceptance.     Dental advisory  given  Plan Discussed with: CRNA  Anesthesia Plan Comments:        Anesthesia Quick Evaluation

## 2021-01-04 ENCOUNTER — Other Ambulatory Visit: Payer: Self-pay

## 2021-01-04 ENCOUNTER — Encounter (HOSPITAL_COMMUNITY): Payer: Self-pay | Admitting: Radiology

## 2021-01-04 DIAGNOSIS — R7989 Other specified abnormal findings of blood chemistry: Secondary | ICD-10-CM

## 2021-01-09 DIAGNOSIS — G894 Chronic pain syndrome: Secondary | ICD-10-CM | POA: Diagnosis not present

## 2021-01-09 DIAGNOSIS — Z9689 Presence of other specified functional implants: Secondary | ICD-10-CM | POA: Diagnosis not present

## 2021-01-09 DIAGNOSIS — M961 Postlaminectomy syndrome, not elsewhere classified: Secondary | ICD-10-CM | POA: Diagnosis not present

## 2021-01-10 ENCOUNTER — Other Ambulatory Visit: Payer: Self-pay | Admitting: Neurological Surgery

## 2021-01-20 ENCOUNTER — Ambulatory Visit
Admission: EM | Admit: 2021-01-20 | Discharge: 2021-01-20 | Disposition: A | Discharge status: Home / Self Care | Payer: Medicare Other | Attending: Physician Assistant | Admitting: Physician Assistant

## 2021-01-20 ENCOUNTER — Encounter: Payer: Self-pay | Admitting: Emergency Medicine

## 2021-01-20 ENCOUNTER — Other Ambulatory Visit: Payer: Self-pay

## 2021-01-20 DIAGNOSIS — R059 Cough, unspecified: Secondary | ICD-10-CM | POA: Diagnosis not present

## 2021-01-20 DIAGNOSIS — J069 Acute upper respiratory infection, unspecified: Secondary | ICD-10-CM | POA: Diagnosis not present

## 2021-01-20 DIAGNOSIS — J029 Acute pharyngitis, unspecified: Secondary | ICD-10-CM | POA: Diagnosis not present

## 2021-01-20 LAB — POCT RAPID STREP A (OFFICE): Rapid Strep A Screen: NEGATIVE

## 2021-01-20 MED ORDER — PREDNISONE 10 MG PO TABS: 20.0000 mg | ORAL_TABLET | Freq: Every day | ORAL | 0 refills | Status: AC

## 2021-01-20 NOTE — Discharge Instructions (Addendum)
Strep was negative in clinic today.  We will contact you if your throat culture was positive and we need to start antibiotics.  We will start prednisone to help manage her symptoms.  Take 20 mg for 3 days.  Do not take NSAIDs including aspirin, ibuprofen/Advil, naproxen/Aleve with this medication as it can cause stomach bleeding.  Continue over-the-counter medications including your allergy medication as well as Mucinex and Tylenol.  If you have any worsening symptoms including difficulty speaking, difficulty swallowing, shortness of breath you need to go to the emergency room.

## 2021-01-20 NOTE — ED Triage Notes (Signed)
Sore throat x 3 days. "Feels like theres a lump in my throat." States she's mildly hoarse from this. No visible exudate in posterior throat/tonsils. No visible or palpable lumps or nodules on external throat. Able to talk, manage secretions without difficulty.
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
## 2021-01-22 ENCOUNTER — Other Ambulatory Visit: Payer: Self-pay

## 2021-01-22 ENCOUNTER — Ambulatory Visit
Admission: EM | Admit: 2021-01-22 | Discharge: 2021-01-22 | Disposition: A | Discharge status: Home / Self Care | Payer: Medicare Other | Attending: Urgent Care | Admitting: Urgent Care

## 2021-01-22 ENCOUNTER — Institutional Professional Consult (permissible substitution): Payer: Medicare Other | Admitting: Cardiology

## 2021-01-22 ENCOUNTER — Ambulatory Visit (INDEPENDENT_AMBULATORY_CARE_PROVIDER_SITE_OTHER): Payer: Medicare Other

## 2021-01-22 ENCOUNTER — Other Ambulatory Visit: Payer: Self-pay | Admitting: Family Medicine

## 2021-01-22 DIAGNOSIS — M5030 Other cervical disc degeneration, unspecified cervical region: Secondary | ICD-10-CM | POA: Diagnosis not present

## 2021-01-22 DIAGNOSIS — G2581 Restless legs syndrome: Secondary | ICD-10-CM

## 2021-01-22 DIAGNOSIS — R070 Pain in throat: Secondary | ICD-10-CM | POA: Diagnosis not present

## 2021-01-22 DIAGNOSIS — M542 Cervicalgia: Secondary | ICD-10-CM

## 2021-01-22 DIAGNOSIS — J029 Acute pharyngitis, unspecified: Secondary | ICD-10-CM | POA: Diagnosis not present

## 2021-01-22 DIAGNOSIS — R0982 Postnasal drip: Secondary | ICD-10-CM

## 2021-01-22 DIAGNOSIS — R1310 Dysphagia, unspecified: Secondary | ICD-10-CM

## 2021-01-22 MED ORDER — IPRATROPIUM BROMIDE 0.03 % NA SOLN: 2.0000 | Freq: Two times a day (BID) | NASAL | 0 refills | Status: DC

## 2021-01-22 MED ORDER — LEVOCETIRIZINE DIHYDROCHLORIDE 5 MG PO TABS: 5.0000 mg | ORAL_TABLET | Freq: Every evening | ORAL | 0 refills | Status: DC

## 2021-01-22 NOTE — ED Triage Notes (Signed)
Pt states seen and tx'd on Sunday for a sore throat. States having difficulty swallowing her pills this am. States on day 3 of prednisone. Denies difficulty breathing. Pt speaking in complete sentences.
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
## 2021-01-23 LAB — CULTURE, GROUP A STREP (THRC)

## 2021-01-29 ENCOUNTER — Ambulatory Visit: Admit: 2021-01-29 | Payer: Medicare Other | Admitting: Neurological Surgery

## 2021-01-29 DIAGNOSIS — M5380 Other specified dorsopathies, site unspecified: Secondary | ICD-10-CM | POA: Diagnosis not present

## 2021-01-29 DIAGNOSIS — M5416 Radiculopathy, lumbar region: Secondary | ICD-10-CM | POA: Diagnosis not present

## 2021-01-29 DIAGNOSIS — M545 Low back pain, unspecified: Secondary | ICD-10-CM | POA: Diagnosis not present

## 2021-01-29 DIAGNOSIS — M961 Postlaminectomy syndrome, not elsewhere classified: Secondary | ICD-10-CM | POA: Diagnosis not present

## 2021-01-29 SURGERY — INSERTION, SPINAL CORD STIMULATOR, LUMBAR
Anesthesia: General

## 2021-01-31 ENCOUNTER — Other Ambulatory Visit: Payer: Self-pay | Admitting: Family Medicine

## 2021-01-31 DIAGNOSIS — G2581 Restless legs syndrome: Secondary | ICD-10-CM

## 2021-01-31 DIAGNOSIS — M8580 Other specified disorders of bone density and structure, unspecified site: Secondary | ICD-10-CM

## 2021-02-04 ENCOUNTER — Telehealth: Payer: Self-pay

## 2021-02-04 NOTE — Progress Notes (Signed)
Chronic Care Management Pharmacy Assistant   Name: Melissa Jordan  MRN: [id number] DOB: 1946/03/28   Reason for Encounter: Disease State - Hypertension     Recent office visits:  12/17/20 Neena Rhymes, MD (PCP) - Family Medicine (Video visit) - COVID 19 - Molnupiravir EUA 200 mg CAPS Take 4 capsules (800 mg total) by mouth 2 (two) times daily for 5 days prescribed. Follow up not indicated.   11/01/20 Neena Rhymes, MD (PCP) - Family Medicine (Video visit) - Bronchitis - Azithromycin (ZITHROMAX) 250 MG tablet 2 tabs on day 1, 1 tab on day 2-5 prescribed. CXR ordered.   Recent consult visits:  01/09/21 Monia Pouch, DO - Neurosurgery - Spinal Cord Stimulator - no notes available.  11/22/20 Blima Rich, PA-C= Cardiology- Pre Op Exam - EKG performed. Referral placed for Cardiac Electrophysicology - Patient can hold eliquis 3 days prior to her procedure and can restart when cleared to do so by her surgeon. Decreased Losartan to 25mg  daily to see it this helps with patient's fatigue.   Hospital visits: 01/22/21  Medication Reconciliation was completed by comparing discharge summary, patient's EMR and Pharmacy list, and upon discussion with patient.  Admitted to the hospital on 01/22/21 due to Throat pain. Discharge date was 01/22/21. Discharged from [facility].  New?Medications Started at [facility] Discharge:?? Started Levocetirizine (XYZAL) 5 MG tablet Take 1 tablet (5 mg total) by mouth every evening. Started Ipratropium (ATROVENT) 0.03 % nasal spray Place 2 sprays into both nostrils 2 (two) times daily.   Medication Changes at Hospital Discharge: No medication changes were noted.   Medications Discontinued at Hospital Discharge: No medications were discontinued at discharge.   Medications that remain the same after Hospital Discharge:??  All other medications will remain the same.    Hospital visits: 01/20/21  Medication Reconciliation was completed by  comparing discharge summary, patient's EMR and Pharmacy list, and upon discussion with patient.  Admitted to the hospital on 01/20/21 due to Upper Respiratory Tract Infection. Discharge date was 01/20/21. Discharged from [facility].    New?Medications Started at [facility] Discharge:?? Started Prednisone (DELTASONE) 10 MG tablet Take 2 tablets (20 mg total) by mouth daily for 3 day with instruction not to take NSAIDs with this medication due to risk of GI bleeding. Encouraged to continue her allergy regimen as previously prescribed.  Medication Changes at Hospital Discharge: No medication changes were noted.   Medications Discontinued at Hospital Discharge: No medications were discontinued.  Medications that remain the same after Hospital Discharge:??  All other medications will remain the same.    Hospital visits: 01/03/21  Medication Reconciliation was completed by comparing discharge summary, patient's EMR and Pharmacy list, and upon discussion with patient.  Admitted to the hospital on 01/03/21 due to MRI with Anesthesia of Thoracic Spine. Discharge date was 01/03/21. Discharged from [facility].    New?Medications Started at [facility] Discharge:?? No new medications were started.  Medication Changes at Hospital Discharge: No medication changes were noted.   Medications Discontinued at Hospital Discharge: No medications were discontinued at discharge.  Medications that remain the same after Hospital Discharge:??  All other medications will remain the same.    Medications: Outpatient Encounter Medications as of 02/04/2021  Medication Sig   acetaminophen (TYLENOL) 500 MG tablet Take 1,000 mg by mouth every 6 (six) hours as needed for moderate pain.    albuterol (VENTOLIN HFA) 108 (90 Base) MCG/ACT inhaler Inhale 2 puffs into the  lungs every 6 (six) hours as needed for wheezing or shortness of breath. (Patient not taking: Reported on 12/26/2020)   apixaban  (ELIQUIS) 5 MG TABS tablet Take 1 tablet (5 mg total) by mouth 2 (two) times daily.   Biotin 95188 MCG TABS Take 10,000 mcg by mouth daily.   CALCIUM-VITAMIN D PO Take 1 tablet by mouth daily.    Cholecalciferol (VITAMIN D) 125 MCG (5000 UT) CAPS Take 5,000 Units by mouth daily.   estradiol (ESTRACE) 1 MG tablet Take 1 tablet (1 mg total) by mouth daily.   fluticasone (FLONASE) 50 MCG/ACT nasal spray Place 2 sprays into both nostrils daily as needed for rhinitis or allergies.   Guaifenesin 1200 MG TB12 Take 1,200 mg by mouth daily as needed for cough.   ipratropium (ATROVENT) 0.03 % nasal spray Place 2 sprays into both nostrils 2 (two) times daily.   levocetirizine (XYZAL) 5 MG tablet Take 1 tablet (5 mg total) by mouth every evening.   levothyroxine (SYNTHROID) 25 MCG tablet Take 1 tablet (25 mcg total) by mouth daily.   loratadine (CLARITIN) 10 MG tablet Take 10 mg by mouth daily.   losartan (COZAAR) 50 MG tablet Take 50 mg by mouth daily.   magnesium oxide (MAG-OX) 400 MG tablet Take 400 mg by mouth daily.   metoprolol tartrate (LOPRESSOR) 25 MG tablet TAKE 1 TABLET BY MOUTH  TWICE DAILY (Patient taking differently: Take 25 mg by mouth 2 (two) times daily.)   Polyethyl Glycol-Propyl Glycol (SYSTANE) 0.4-0.3 % SOLN Place 1 drop into both eyes daily.   rOPINIRole (REQUIP) 2 MG tablet Take 1 tablet (2 mg total) by mouth at bedtime.   traMADol (ULTRAM) 50 MG tablet Take 50 mg by mouth every 6 (six) hours as needed for moderate pain or severe pain.   vitamin C (ASCORBIC ACID) 500 MG tablet Take 500 mg by mouth daily.   zinc gluconate 50 MG tablet Take 50 mg by mouth daily.   No facility-administered encounter medications on file as of 02/04/2021.    Current antihypertensive regimen:  Losartan 50 mg tablet daily  Metoprolol 25 mg tablet twice daily   How often are you checking your Blood Pressure?  Patient reported she is checking her blood pressures about every other day.   Current home  BP readings: 120/60 (pt reported this week)   What recent interventions/DTPs have been made by any provider to improve Blood Pressure control since last CPP Visit:  Patient reported she did decrease Losartan 25 mg after her 11/22/20 Cardiology visit due to feeling fatigue. She reports that did not make much difference so she went back to Losartan 50 mg daily.   Any recent hospitalizations or ED visits since last visit with CPP?  Patient has had hospitalizations and ED visits on 01/22/21, 01/20/21, and 01/03/21   What diet changes have been made to improve Blood Pressure Control?  Patient reported being careful with her sodium intake. She did state she has had a little more salt lately than usual due to eating tomatoes.    What exercise is being done to improve your Blood Pressure Control?  Patient reported remaining active. States she just had surgery last week but is recovering well and plans to get back to her normal activity level soon.     Adherence Review: Is the patient currently on ACE/ARB medication? Yes Does the patient have >5 day gap between last estimated fill dates? No  Losartan 50 mg tablet daily - last filled 01/05/21  90 days Metoprolol 25 mg tablet twice daily - last filled 01/31/21 90 days    Star Rating Drugs: Losartan (COZAAR) 50 MG tablet - last filled 01/05/21 90 days    Future Appointments  Date Time Provider Department Center  02/28/2021  9:00 AM Regan Lemming, MD [facility]  03/21/2021 10:45 AM Romero Belling, MD [facility] None  04/11/2021  1:30 PM [facility] CCM PHARMACIST [facility]  04/22/2021  9:00 AM [facility] HEALTH COACH [facility]     Eugenie Filler, CCMA Clinical Pharmacist Assistant  [phone]  Time Spent: 40 minutes

## 2021-02-12 ENCOUNTER — Telehealth: Payer: Self-pay

## 2021-02-12 NOTE — Progress Notes (Signed)
    Chronic Care Management Pharmacy Assistant   Name: Melissa Jordan  MRN: [id number] DOB: [birth year]   Reason for Encounter: Chart Review / PAP update  Chart review completed and per 10/29/20 note patient stated she received a discount card from [facility] [employer] for Eliquis, but says it will cost her $1600 for 180 pills, which is more expensive than her $45 copay with her insurance.  She stated that she will continue to use her insurance to fill Eliquis. Have updated panel to check again with patient at next CPP follow up to see if patient is interested in trying application again.   Eugenie Filler, CCMA Clinical Pharmacist Assistant  [phone]  Time Spent: 11 minutes

## 2021-02-28 ENCOUNTER — Encounter: Payer: Self-pay | Admitting: Cardiology

## 2021-02-28 ENCOUNTER — Ambulatory Visit: Payer: Medicare Other | Admitting: Cardiology

## 2021-02-28 ENCOUNTER — Other Ambulatory Visit: Payer: Self-pay

## 2021-02-28 VITALS — BP 102/70 | HR 69 | Ht 63.0 in | Wt 149.2 lb

## 2021-02-28 DIAGNOSIS — I480 Paroxysmal atrial fibrillation: Secondary | ICD-10-CM
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
## 2021-02-28 NOTE — Patient Instructions (Signed)
Medication Instructions:  Your physician recommends that you continue on your current medications as directed. Please refer to the Current Medication list given to you today.  *If you need a refill on your cardiac medications before your next appointment, please call your pharmacy*   Lab Work: None ordered   Testing/Procedures: None ordered   Follow-Up: At [facility], you and your health needs are our priority.  As part of our continuing mission to provide you with exceptional heart care, we have created designated Provider Care Teams.  These Care Teams include your primary Cardiologist (physician) and Advanced Practice Providers (APPs -  Physician Assistants and Nurse Practitioners) who all work together to provide you with the care you need, when you need it.  Your next appointment:   as needed  The format for your next appointment:   In Person  Provider:   Loman Brooklyn, MD    Thank you for choosing [facility]!!   Dory Horn, RN [phone]   Other Instructions  [employer] Patient [facility] --- Eliquis [phone]

## 2021-03-17 ENCOUNTER — Other Ambulatory Visit: Payer: Self-pay | Admitting: Family Medicine

## 2021-03-17 DIAGNOSIS — M8580 Other specified disorders of bone density and structure, unspecified site: Secondary | ICD-10-CM

## 2021-03-21 ENCOUNTER — Other Ambulatory Visit: Payer: Self-pay

## 2021-03-21 ENCOUNTER — Encounter: Payer: Self-pay | Admitting: Endocrinology

## 2021-03-21 ENCOUNTER — Ambulatory Visit (INDEPENDENT_AMBULATORY_CARE_PROVIDER_SITE_OTHER): Payer: Medicare Other | Admitting: Endocrinology

## 2021-03-21 VITALS — BP 120/70 | HR 60 | Ht 63.0 in | Wt 150.6 lb

## 2021-03-21 DIAGNOSIS — E039 Hypothyroidism, unspecified: Secondary | ICD-10-CM | POA: Diagnosis not present

## 2021-03-21 NOTE — Patient Instructions (Addendum)
Please redo the thyroid blood tests next week. Until then, please stop taking the biotin.  Based on the results, I will probably says you should resume the levothyroxine.   I would be happy to see you back here as needed.
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
## 2021-03-25 DIAGNOSIS — G894 Chronic pain syndrome: Secondary | ICD-10-CM | POA: Diagnosis not present

## 2021-03-26 ENCOUNTER — Other Ambulatory Visit: Payer: Self-pay

## 2021-03-26 MED ORDER — IPRATROPIUM BROMIDE 0.03 % NA SOLN: 2.0000 | Freq: Two times a day (BID) | NASAL | 0 refills | Status: DC

## 2021-03-27 ENCOUNTER — Other Ambulatory Visit: Payer: Self-pay | Admitting: Family Medicine

## 2021-03-28 ENCOUNTER — Other Ambulatory Visit: Payer: Self-pay

## 2021-03-28 ENCOUNTER — Other Ambulatory Visit (INDEPENDENT_AMBULATORY_CARE_PROVIDER_SITE_OTHER): Payer: Medicare Other

## 2021-03-28 DIAGNOSIS — E039 Hypothyroidism, unspecified: Secondary | ICD-10-CM | POA: Diagnosis not present

## 2021-03-28 LAB — TSH: TSH: 4.16 u[IU]/mL (ref 0.35–5.50)

## 2021-03-28 LAB — T3, FREE: T3, Free: 2.9 pg/mL (ref 2.3–4.2)

## 2021-03-28 LAB — T4, FREE: Free T4: 0.64 ng/dL (ref 0.60–1.60)

## 2021-04-10 ENCOUNTER — Ambulatory Visit (INDEPENDENT_AMBULATORY_CARE_PROVIDER_SITE_OTHER): Payer: Medicare Other

## 2021-04-10 DIAGNOSIS — E039 Hypothyroidism, unspecified: Secondary | ICD-10-CM

## 2021-04-10 DIAGNOSIS — E785 Hyperlipidemia, unspecified: Secondary | ICD-10-CM

## 2021-04-10 DIAGNOSIS — I10 Essential (primary) hypertension: Secondary | ICD-10-CM

## 2021-04-10 MED ORDER — LEVOTHYROXINE SODIUM 25 MCG PO TABS: 25.0000 ug | ORAL_TABLET | Freq: Every day | ORAL | 3 refills | Status: AC

## 2021-04-10 NOTE — Addendum Note (Signed)
Addended by: Dahlia Byes on: 04/10/2021 04:49 PM   Modules accepted: Orders

## 2021-04-10 NOTE — Patient Instructions (Addendum)
Ms. Demonte,  Thank you for talking with me today. I have included our care plan/goals in the following pages.   Please review and call me at [phone] with any questions.  Thanks! Johnell Comings, PharmD Clinical Pharmacist  [phone]  Care Plan : [facility] pharmacy care plan  Updates made by Dahlia Byes, [facility] since 04/10/2021 12:00 AM     Problem: hld htn hypothyroid rls afib   Priority: High     Long-Range Goal: disease management   Start Date: 04/10/2021  Expected End Date: 04/10/2022  This Visit's Progress: On track  Priority: High  Note:   Current Barriers:  Unable to maintain control of hypothyroid  Pharmacist Clinical Goal(s):  Patient will verbalize ability to afford treatment regimen through collaboration with PharmD and provider.   Interventions: 1:1 collaboration with Sheliah Hatch, MD regarding development and update of comprehensive plan of care as evidenced by provider attestation and co-signature Inter-disciplinary care team collaboration (see longitudinal plan of care) Comprehensive medication review performed; medication list updated in electronic medical record  Hypertension (BP goal <130/80) -Controlled -Current treatment: Losartan 50 mg once dailyt  Metoprolol tartrate 25 mg twice daily -Medications previously tried: n/a  -Current home readings: <130/80 -Current dietary habits: high in vegetables -Current exercise habits: stays active, current working on a remodel -Denies hypotensive/hypertensive symptoms -Educated on Importance of home blood pressure monitoring; -Counseled to monitor BP at home 1-2x/wk, document, and provide log at future appointments -Recommended to continue current medication  Hyperlipidemia: (LDL goal < 100) -Controlled -Current treatment: No current medication -Medications previously tried: n/a  -Current exercise habits: see htn -Educated on Cholesterol goals;   Osteopenia (Goal prevent fractures,  ensure appropriate supplementation) -Controlled -Last DEXA Scan: due. -Patient is not a candidate for pharmacologic treatment - needs updated DEXA, pt aware of order placed  -Current treatment  Calcium-vitamin d3 supplementation -Medications previously tried: n/a  -Recommend [phone] units of vitamin D daily. Recommend 1200 mg of calcium daily from dietary and supplemental sources. Recommend weight-bearing and muscle strengthening exercises for building and maintaining bone density. -Recommended to continue current medication  Hypothyroidism (Goal: ensure medication safety) -Not ideally controlled, recent had normal TSH but elevated T3/T4. Pt had been taking biotin and was though to have interfere with assay (falsely low TSH, falsely high TSH). Had been advised to resume tx with levothyroxine by dr Everardo All.  -Current treatment  Levothyroxine 25 mcg once daily  -Medications previously tried: previously on higher levothyroxine dose, otherwise none.  -Recommended to continue current medication  Patient Goals/Self-Care Activities Patient will:  - target a minimum of 150 minutes of moderate intensity exercise weekly  Medication Assistance: Application for Eliquis medication assistance program. in process.  Anticipated assistance start date 04/2021. CPA will contact [employer], to confirm receipt of application and see if there is anything further needed. Patient advised to get OOP spending report from local and mail order pharmacy.     The patient verbalized understanding of instructions provided today and agreed to receive a MyChart copy of patient instruction and/or educational materials. Telephone follow up appointment with pharmacy team member scheduled for: See next appointment with "Care Management Staff" under "What's Next" below.

## 2021-04-10 NOTE — Progress Notes (Addendum)
Chronic Care Management Pharmacy Note  04/10/2021 Name:  Melissa Jordan MRN:  [id number] DOB:  1946-02-21  Summary: Pt to start back on levothyroxine per dr Loanne Drilling. Possible interference with biotin supplement previously. Requesting PCP to refill levothyroxine 25 mcg - sent msg.  Update: Rx sent to pharmacy. Left msg with pt.  Will check on PAP for Eliquis   Subjective: Melissa Jordan is an 75 y.o. year old female who is a primary patient of Tabori, Aundra Millet, MD.  The CCM team was consulted for assistance with disease management and care coordination needs.    Engaged with patient by telephone for follow up visit in response to provider referral for pharmacy case management and/or care coordination services.   Consent to Services:  The patient was given information about Chronic Care Management services, agreed to services, and gave verbal consent prior to initiation of services.  Please see initial visit note for detailed documentation.   Patient Care Team: Midge Minium, MD as PCP - General (Family Medicine) Donato Heinz, MD as PCP - Cardiology (Cardiology) Glenna Fellows, MD as Attending Physician (Neurosurgery) Vickey Huger, MD as Consulting Physician (Orthopedic Surgery) Galvin Proffer, [place] (Optometry) Levin Erp (Dentistry) Charolette Forward, MD as Consulting Physician (Cardiology) Madelin Rear, [facility] as Pharmacist (Pharmacist)  Hospital visits: None in previous 6 months  Objective:  Lab Results  Component Value Date   CREATININE 0.84 01/03/2021   CREATININE 0.85 10/17/2020   CREATININE 0.88 08/02/2020    No results found for: HGBA1C Last diabetic Eye exam: No results found for: HMDIABEYEEXA  Last diabetic Foot exam: No results found for: HMDIABFOOTEX      Component Value Date/Time   CHOL 165 10/17/2020 0937   CHOL 183 01/31/2020 0927   CHOL 196 04/05/2019 1043   TRIG 81.0 10/17/2020 0937   TRIG 114.0 01/31/2020 0927   TRIG  136.0 04/05/2019 1043   TRIG 110 06/24/2006 0842   HDL 61.10 10/17/2020 0937   HDL 59.70 01/31/2020 0927   HDL 61.00 04/05/2019 1043   CHOLHDL 3 10/17/2020 0937   VLDL 16.2 10/17/2020 0937   LDLCALC 88 10/17/2020 0937   LDLCALC 100 (H) 01/31/2020 0927   LDLCALC 108 (H) 04/05/2019 1043   LDLDIRECT 133.8 03/29/2012 1110   LDLDIRECT 142.6 01/29/2011 1022   LDLDIRECT 141.6 08/02/2008 0946    Hepatic Function Latest Ref Rng & Units 10/17/2020 01/31/2020 04/05/2019  Total Protein 6.0 - 8.3 g/dL 6.2 6.0 5.8(L)  Albumin 3.5 - 5.2 g/dL 4.2 4.0 4.0  AST 0 - 37 U/L '15 12 13  ' ALT 0 - 35 U/L '10 9 11  ' Alk Phosphatase 39 - 117 U/L 92 80 79  Total Bilirubin 0.2 - 1.2 mg/dL 0.7 0.5 0.8  Bilirubin, Direct 0.0 - 0.3 mg/dL 0.1 0.1 0.1    Lab Results  Component Value Date/Time   TSH 4.16 03/28/2021 09:20 AM   TSH 3.72 01/02/2021 09:11 AM   FREET4 0.64 03/28/2021 09:20 AM   FREET4 1.67 (H) 01/02/2021 09:11 AM    CBC Latest Ref Rng & Units 10/17/2020 08/02/2020 01/31/2020  WBC 4.0 - 10.5 K/uL 11.8(H) 7.2 8.2  Hemoglobin 12.0 - 15.0 g/dL 12.6 13.4 12.9  Hematocrit 36.0 - 46.0 % 38.0 40.7 38.7  Platelets 150.0 - 400.0 K/uL 167.0 215 215.0    Lab Results  Component Value Date/Time   VD25OH 62.74 10/17/2020 09:37 AM   VD25OH 56.69 01/31/2020 09:27 AM    Clinical ASCVD:  The 10-year ASCVD risk  score (Arnett DK, et al., 2019) is: 17.4%   Values used to calculate the score:     Age: 65 years     Sex: Female     Is Non-Hispanic African American: No     Diabetic: No     Tobacco smoker: No     Systolic Blood Pressure: 595 mmHg     Is BP treated: Yes     HDL Cholesterol: 61.1 mg/dL     Total Cholesterol: 165 mg/dL   Social History   Tobacco Use  Smoking Status Never  Smokeless Tobacco Never   BP Readings from Last 3 Encounters:  03/21/21 120/70  02/28/21 102/70  01/22/21 128/74   Pulse Readings from Last 3 Encounters:  03/21/21 60  02/28/21 69  01/22/21 69   Wt Readings from Last 3  Encounters:  03/21/21 150 lb 9.6 oz (68.3 kg)  02/28/21 149 lb 3.2 oz (67.7 kg)  01/03/21 145 lb 1 oz (65.8 kg)   BMI Readings from Last 3 Encounters:  03/21/21 26.68 kg/m  02/28/21 26.43 kg/m  01/03/21 25.70 kg/m    Assessment: Review of patient past medical history, allergies, medications, health status, including review of consultants reports, laboratory and other test data, was performed as part of comprehensive evaluation and provision of chronic care management services.   SDOH:  (Social Determinants of Health) assessments and interventions performed: Yes   CCM Care Plan  Allergies  Allergen Reactions   Lyrica [Pregabalin] Palpitations and Other (See Comments)    "thought I was going to have a heart attack; heart started pounding so hard, I couldn't catch my breath" (06/28/2012)   Meclizine Other (See Comments)    "what was in my mind to say wasn't what came out to say; I was kind of in another world" (06/28/2012)   Penicillins Itching and Rash    Has patient had a PCN reaction causing immediate rash, facial/tongue/throat swelling, SOB or lightheadedness with hypotension: No Has patient had a PCN reaction causing severe rash involving mucus membranes or skin necrosis: No Has patient had a PCN reaction that required hospitalization No Has patient had a PCN reaction occurring within the last 10 years:   # # YES # #  If all of the above answers are "NO", then may proceed with Cephalosporin use.    Poison Sumac Extract Swelling, Rash and Other (See Comments)    Blisters   Propoxyphene Nausea And Vomiting   Bactrim [Sulfamethoxazole-Trimethoprim] Rash   Codeine Nausea Only   Morphine Sulfate Rash   Propoxyphene N-Acetaminophen Nausea And Vomiting    "Darvocet"   Vancomycin Rash    Medications Reviewed Today     Reviewed by Madelin Rear, [facility] (Pharmacist) on 04/10/21 at 1359  Med List Status: <None>   Medication Order Taking? Sig Documenting Provider Last Dose Status  Informant  acetaminophen (TYLENOL) 500 MG tablet [id number]  Take 1,000 mg by mouth every 6 (six) hours as needed for moderate pain.  [provider]  Active Self    Discontinued 04/10/21 1339 (No longer needed (for PRN medications)) apixaban (ELIQUIS) 5 MG TABS tablet [id number] Yes Take 1 tablet (5 mg total) by mouth 2 (two) times daily. Donato Heinz, MD  Active Self    Discontinued 04/10/21 1340 (Patient Preference)          Med Note>> Madelin Rear, [facility]   04/10/2021  1:40 PM interference with free thyroid levels    CALCIUM-VITAMIN D PO [id number] Yes Take 1 tablet by mouth  daily.  [provider]  Active Self  Cholecalciferol (VITAMIN D) 125 MCG (5000 UT) CAPS [id number] Yes Take 5,000 Units by mouth daily. [provider]  Active Self  estradiol (ESTRACE) 1 MG tablet [id number] Yes TAKE 1 TABLET BY MOUTH  DAILY Midge Minium, MD  Active   fluticasone (FLONASE) 50 MCG/ACT nasal spray [id number]  Place 2 sprays into both nostrils daily as needed for rhinitis or allergies. [provider]  Active Self  Guaifenesin 1200 MG TB12 [id number]  Take 1,200 mg by mouth daily as needed for cough. [provider]  Active Self    Discontinued 04/10/21 1355 (No longer needed (for PRN medications))     Discontinued 04/10/21 1356 (No longer needed (for PRN medications))   levothyroxine (SYNTHROID) 25 MCG tablet [id number] Yes Take 1 tablet (25 mcg total) by mouth daily. Midge Minium, MD  Active Self  loratadine (CLARITIN) 10 MG tablet [id number] Yes Take 10 mg by mouth daily. [provider]  Active Self  losartan (COZAAR) 50 MG tablet [id number] Yes Take 50 mg by mouth daily. [provider]  Active Self  magnesium oxide (MAG-OX) 400 MG tablet [id number] Yes Take 400 mg by mouth daily. [provider]  Active Self  metoprolol tartrate (LOPRESSOR) 25 MG tablet [id number] Yes TAKE 1 TABLET BY MOUTH  TWICE DAILY  Patient taking  differently: Take 25 mg by mouth 2 (two) times daily.   Midge Minium, MD  Active   Polyethyl Glycol-Propyl Glycol (SYSTANE) 0.4-0.3 % SOLN [id number]  Place 1 drop into both eyes daily. [provider]  Active Self  rOPINIRole (REQUIP) 2 MG tablet [id number] Yes Take 1 tablet (2 mg total) by mouth at bedtime. Midge Minium, MD  Active Self  traMADol Veatrice Bourbon) 50 MG tablet [id number]  Take 50 mg by mouth every 6 (six) hours as needed for moderate pain or severe pain. [provider]  Active Self  vitamin C (ASCORBIC ACID) 500 MG tablet [id number] Yes Take 500 mg by mouth daily. [provider]  Active Self  zinc gluconate 50 MG tablet [id number] Yes Take 50 mg by mouth daily. 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   Traumatic arthritis of right ankle    Pain in right ankle and joints of right foot 08/05/2018   A-fib ([place]) 03/10/2017   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/2016   RLS (restless legs syndrome) 01/11/2015   Osteopenia 05/15/2014   Hyperlipidemia 05/24/2013   Routine general medical examination at a health care facility 03/29/2012   POSTMENOPAUSAL SYNDROME 09/26/2009   URINARY URGENCY 09/26/2009   ANEMIA 01/09/2009   Back pain of lumbar region with sciatica 01/09/2009   LAMINECTOMY, LUMBAR, HX OF 08/09/2008   Hypothyroid 03/03/2007   HYPERTENSION, BENIGN 03/03/2007   GERD 02/10/2007    Immunization History  Administered Date(s) Administered   Fluad Quad(high  Dose 65+) 04/05/2019   Influenza, High Dose Seasonal PF 05/30/2014, 03/31/2018   Influenza,inj,Quad PF,6+ Mos 05/24/2013, 05/21/2015, 05/14/2016, 03/10/2017  PFIZER(Purple Top)SARS-COV-2 Vaccination 08/19/2019, 09/09/2019   Pneumococcal Polysaccharide-23 05/24/2010   Td 06/23/2000   Tdap 08/22/2011    Conditions to be addressed/monitored: HLD HTN Hypothyroid RLS Afib  Care Plan : ccm pharmacy care plan  Updates made by Madelin Rear, [place] since 04/10/2021 12:00 AM     Problem: hld htn hypothyroid rls afib   Priority: High     Long-Range Goal: disease management   Start Date: 04/10/2021  Expected End Date: 04/10/2022  This Visit's Progress: On track  Priority: High  Note:   Current Barriers:  Unable to maintain control of hypothyroid  Pharmacist Clinical Goal(s):  Patient will verbalize ability to afford treatment regimen through collaboration with PharmD and provider.   Interventions: 1:1 collaboration with Midge Minium, MD regarding development and update of comprehensive plan of care as evidenced by provider attestation and co-signature Inter-disciplinary care team collaboration (see longitudinal plan of care) Comprehensive medication review performed; medication list updated in electronic medical record  Hypertension (BP goal <130/80) -Controlled -Current treatment: Losartan 50 mg once dailyt  Metoprolol tartrate 25 mg twice daily -Medications previously tried: n/a  -Current home readings: <130/80 -Current dietary habits: high in vegetables -Current exercise habits: stays active, current working on a remodel -Denies hypotensive/hypertensive symptoms -Educated on Importance of home blood pressure monitoring; -Counseled to monitor BP at home 1-2x/wk, document, and provide log at future appointments -Recommended to continue current medication  Hyperlipidemia: (LDL goal < 100) -Controlled -Current treatment: No current medication -Medications previously  tried: n/a  -Current exercise habits: see htn -Educated on Cholesterol goals;   Osteopenia (Goal prevent fractures, ensure appropriate supplementation) -Controlled -Last DEXA Scan: due. -Patient is not a candidate for pharmacologic treatment - needs updated DEXA, pt aware of order placed  -Current treatment  Calcium-vitamin d3 supplementation -Medications previously tried: n/a  -Recommend [id number] units of vitamin D daily. Recommend 1200 mg of calcium daily from dietary and supplemental sources. Recommend weight-bearing and muscle strengthening exercises for building and maintaining bone density. -Recommended to continue current medication  Hypothyroidism (Goal: ensure medication safety) -Not ideally controlled, recent had normal TSH but elevated T3/T4. Pt had been taking biotin and was though to have interfere with assay (falsely low TSH, falsely high TSH). Had been advised to resume tx with levothyroxine by dr Loanne Drilling.  -Current treatment  Levothyroxine 25 mcg once daily  -Medications previously tried: previously on higher levothyroxine dose, otherwise none.  -Recommended to continue current medication  Patient Goals/Self-Care Activities Patient will:  - target a minimum of 150 minutes of moderate intensity exercise weekly  Medication Assistance: Application for Eliquis medication assistance program. in process.  Anticipated assistance start date 04/2021. CPA will contact [employer], to confirm receipt of application and see if there is anything further needed. Patient advised to get OOP spending report from local and mail order pharmacy.     Patient's preferred pharmacy is:  [facility] [address] ([address]), [place] - [place] [address] [place]) [place] [address] Phone: [phone] Fax: [phone]  [facility]  ([place], [place] [facility] [place] [address] [address] Phone: [phone] Fax:  [place], [place] [address] [place] [address] Phone: [phone] Fax: [phone]  [facility] ([facility]) - [place], [place] [address] [place] [address] Phone: [phone] Fax: [phone]   Pt endorses 100% compliance  Follow Up:  Patient agrees to Care Plan and Follow-up. Plan: HC 5 month BP. Pharmacist 8 month f/u or sooner if needed.  Future Appointments  Date Time Provider [place]  05/02/2021  9:45 AM [facility] HEALTH COACH [facility]  12/11/2021  3:45 PM [facility] CCM PHARMACIST [facility]   Madelin Rear, PharmD, CPP Clinical Pharmacist Practitioner  [place]  [phone]

## 2021-04-11 ENCOUNTER — Telehealth: Payer: Medicare Other

## 2021-04-12 ENCOUNTER — Telehealth: Payer: Self-pay

## 2021-04-12 NOTE — Progress Notes (Signed)
    Chronic Care Management Pharmacy Assistant   Name: Melissa Jordan  MRN: [id number] DOB: [birth year]  Reason for Encounter: CMA Phone Call - Eliquis PAP Status  Called [employer] at [phone] to check on status of patient's PAP. Per rep they do not have record of an PAP for patient on file. Based on what patient reported the rep stated they do have a copay assistance card department which it sounded like is what patient had applied for. Will print a PAP for Eliquis and mail to patient to start the process.   Eugenie Filler, CCMA Clinical Pharmacist Assistant  [phone]  Time Spent: 20 minutes

## 2021-04-22 ENCOUNTER — Ambulatory Visit: Payer: Medicare Other

## 2021-04-22 DIAGNOSIS — E039 Hypothyroidism, unspecified: Secondary | ICD-10-CM

## 2021-04-22 DIAGNOSIS — E785 Hyperlipidemia, unspecified: Secondary | ICD-10-CM

## 2021-04-22 DIAGNOSIS — I10 Essential (primary) hypertension: Secondary | ICD-10-CM | POA: Diagnosis not present

## 2021-05-02 ENCOUNTER — Ambulatory Visit (INDEPENDENT_AMBULATORY_CARE_PROVIDER_SITE_OTHER): Payer: Medicare Other

## 2021-05-02 ENCOUNTER — Other Ambulatory Visit: Payer: Self-pay | Admitting: Family Medicine

## 2021-05-02 DIAGNOSIS — Z1231 Encounter for screening mammogram for malignant neoplasm of breast: Secondary | ICD-10-CM

## 2021-05-02 DIAGNOSIS — Z0000 Encounter for general adult medical examination without abnormal findings: Secondary | ICD-10-CM
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
## 2021-05-02 NOTE — Patient Instructions (Signed)
Melissa Jordan , Thank you for taking time to come for your Medicare Wellness Visit. I appreciate your ongoing commitment to your health goals. Please review the following plan we discussed and let me know if I can assist you in the future.   Screening recommendations/referrals: Colonoscopy: 08/09/2015  due 2027 Mammogram: 06/11/2021 Bone Density: 10/16/2016 Recommended yearly ophthalmology/optometry visit for glaucoma screening and checkup Recommended yearly dental visit for hygiene and checkup  Vaccinations: Influenza vaccine: due  Pneumococcal vaccine: completed  Tdap vaccine: 09/01/2011 Shingles vaccine: will consider     Advanced directives: none Conditions/risks identified: none   Next appointment: 05/13/2021  1030  Dr.Tabori    Preventive Care 75 Years and Older, Female Preventive care refers to lifestyle choices and visits with your health care provider that can promote health and wellness. What does preventive care include? A yearly physical exam. This is also called an annual well check. Dental exams once or twice a year. Routine eye exams. Ask your health care provider how often you should have your eyes checked. Personal lifestyle choices, including: Daily care of your teeth and gums. Regular physical activity. Eating a healthy diet. Avoiding tobacco and drug use. Limiting alcohol use. Practicing safe sex. Taking low-dose aspirin every day. Taking vitamin and mineral supplements as recommended by your health care provider. What happens during an annual well check? The services and screenings done by your health care provider during your annual well check will depend on your age, overall health, lifestyle risk factors, and family history of disease. Counseling  Your health care provider may ask you questions about your: Alcohol use. Tobacco use. Drug use. Emotional well-being. Home and relationship well-being. Sexual activity. Eating habits. History of  falls. Memory and ability to understand (cognition). Work and work Astronomer. Reproductive health. Screening  You may have the following tests or measurements: Height, weight, and BMI. Blood pressure. Lipid and cholesterol levels. These may be checked every 5 years, or more frequently if you are over 2 years old. Skin check. Lung cancer screening. You may have this screening every year starting at age 62 if you have a 30-pack-year history of smoking and currently smoke or have quit within the past 15 years. Fecal occult blood test (FOBT) of the stool. You may have this test every year starting at age 41. Flexible sigmoidoscopy or colonoscopy. You may have a sigmoidoscopy every 5 years or a colonoscopy every 10 years starting at age 73. Hepatitis C blood test. Hepatitis B blood test. Sexually transmitted disease (STD) testing. Diabetes screening. This is done by checking your blood sugar (glucose) after you have not eaten for a while (fasting). You may have this done every 1-3 years. Bone density scan. This is done to screen for osteoporosis. You may have this done starting at age 81. Mammogram. This may be done every 1-2 years. Talk to your health care provider about how often you should have regular mammograms. Talk with your health care provider about your test results, treatment options, and if necessary, the need for more tests. Vaccines  Your health care provider may recommend certain vaccines, such as: Influenza vaccine. This is recommended every year. Tetanus, diphtheria, and acellular pertussis (Tdap, Td) vaccine. You may need a Td booster every 10 years. Zoster vaccine. You may need this after age 52. Pneumococcal 13-valent conjugate (PCV13) vaccine. One dose is recommended after age 49. Pneumococcal polysaccharide (PPSV23) vaccine. One dose is recommended after age 68. Talk to your health care provider about which screenings and vaccines  you need and how often you need  them. This information is not intended to replace advice given to you by your health care provider. Make sure you discuss any questions you have with your health care provider. Document Released: 07/06/2015 Document Revised: 02/27/2016 Document Reviewed: 04/10/2015 Elsevier Interactive Patient Education  2017 [place] Prevention in the Home Falls can cause injuries. They can happen to people of all ages. There are many things you can do to make your home safe and to help prevent falls. What can I do on the outside of my home? Regularly fix the edges of walkways and driveways and fix any cracks. Remove anything that might make you trip as you walk through a door, such as a raised step or threshold. Trim any bushes or trees on the path to your home. Use bright outdoor lighting. Clear any walking paths of anything that might make someone trip, such as rocks or tools. Regularly check to see if handrails are loose or broken. Make sure that both sides of any steps have handrails. Any raised decks and porches should have guardrails on the edges. Have any leaves, snow, or ice cleared regularly. Use sand or salt on walking paths during winter. Clean up any spills in your garage right away. This includes oil or grease spills. What can I do in the bathroom? Use night lights. Install grab bars by the toilet and in the tub and shower. Do not use towel bars as grab bars. Use non-skid mats or decals in the tub or shower. If you need to sit down in the shower, use a plastic, non-slip stool. Keep the floor dry. Clean up any water that spills on the floor as soon as it happens. Remove soap buildup in the tub or shower regularly. Attach bath mats securely with double-sided non-slip rug tape. Do not have throw rugs and other things on the floor that can make you trip. What can I do in the bedroom? Use night lights. Make sure that you have a light by your bed that is easy to reach. Do not use  any sheets or blankets that are too big for your bed. They should not hang down onto the floor. Have a firm chair that has side arms. You can use this for support while you get dressed. Do not have throw rugs and other things on the floor that can make you trip. What can I do in the kitchen? Clean up any spills right away. Avoid walking on wet floors. Keep items that you use a lot in easy-to-reach places. If you need to reach something above you, use a strong step stool that has a grab bar. Keep electrical cords out of the way. Do not use floor polish or wax that makes floors slippery. If you must use wax, use non-skid floor wax. Do not have throw rugs and other things on the floor that can make you trip. What can I do with my stairs? Do not leave any items on the stairs. Make sure that there are handrails on both sides of the stairs and use them. Fix handrails that are broken or loose. Make sure that handrails are as long as the stairways. Check any carpeting to make sure that it is firmly attached to the stairs. Fix any carpet that is loose or worn. Avoid having throw rugs at the top or bottom of the stairs. If you do have throw rugs, attach them to the floor with carpet tape. Make sure  that you have a light switch at the top of the stairs and the bottom of the stairs. If you do not have them, ask someone to add them for you. What else can I do to help prevent falls? Wear shoes that: Do not have high heels. Have rubber bottoms. Are comfortable and fit you well. Are closed at the toe. Do not wear sandals. If you use a stepladder: Make sure that it is fully opened. Do not climb a closed stepladder. Make sure that both sides of the stepladder are locked into place. Ask someone to hold it for you, if possible. Clearly mark and make sure that you can see: Any grab bars or handrails. First and last steps. Where the edge of each step is. Use tools that help you move around (mobility aids)  if they are needed. These include: Canes. Walkers. Scooters. Crutches. Turn on the lights when you go into a dark area. Replace any light bulbs as soon as they burn out. Set up your furniture so you have a clear path. Avoid moving your furniture around. If any of your floors are uneven, fix them. If there are any pets around you, be aware of where they are. Review your medicines with your doctor. Some medicines can make you feel dizzy. This can increase your chance of falling. Ask your doctor what other things that you can do to help prevent falls. This information is not intended to replace advice given to you by your health care provider. Make sure you discuss any questions you have with your health care provider. Document Released: 04/05/2009 Document Revised: 11/15/2015 Document Reviewed: 07/14/2014 Elsevier Interactive Patient Education  2017 [employer].

## 2021-05-13 ENCOUNTER — Ambulatory Visit: Payer: Medicare Other | Admitting: Family Medicine

## 2021-05-14 ENCOUNTER — Ambulatory Visit (INDEPENDENT_AMBULATORY_CARE_PROVIDER_SITE_OTHER): Payer: Medicare Other | Admitting: Registered Nurse

## 2021-05-14 ENCOUNTER — Encounter: Payer: Self-pay | Admitting: Registered Nurse

## 2021-05-14 ENCOUNTER — Other Ambulatory Visit: Payer: Self-pay

## 2021-05-14 VITALS — BP 127/64 | HR 65 | Temp 98.1°F | Resp 18 | Ht 63.0 in | Wt 151.8 lb

## 2021-05-14 DIAGNOSIS — R0981 Nasal congestion: Secondary | ICD-10-CM | POA: Diagnosis not present

## 2021-05-14 DIAGNOSIS — Z23 Encounter for immunization: Secondary | ICD-10-CM | POA: Diagnosis not present

## 2021-05-14 DIAGNOSIS — R351 Nocturia: Secondary | ICD-10-CM

## 2021-05-14 DIAGNOSIS — N3000 Acute cystitis without hematuria: Secondary | ICD-10-CM | POA: Diagnosis not present

## 2021-05-14 LAB — POCT URINALYSIS DIP (MANUAL ENTRY)
Bilirubin, UA: NEGATIVE
Glucose, UA: NEGATIVE mg/dL
Ketones, POC UA: NEGATIVE mg/dL
Nitrite, UA: NEGATIVE
Spec Grav, UA: 1.015 (ref 1.010–1.025)
Urobilinogen, UA: 0.2 E.U./dL
pH, UA: 8 (ref 5.0–8.0)

## 2021-05-14 MED ORDER — CIPROFLOXACIN HCL 500 MG PO TABS: 500.0000 mg | ORAL_TABLET | Freq: Two times a day (BID) | ORAL | 0 refills | Status: DC

## 2021-05-14 MED ORDER — AZELASTINE HCL 0.1 % NA SOLN: 1.0000 | Freq: Two times a day (BID) | NASAL | 12 refills | Status: DC

## 2021-05-14 NOTE — Patient Instructions (Addendum)
If you have lab work done today you will be contacted with your lab results within the next 2 weeks.  If you have not heard from [place] then please contact us. The fastest way to get your results is to register for My Chart.   IF you received an x-ray today, you will receive an invoice from [facility]. Please contact [facility] at [phone] with questions or concerns regarding your invoice.   IF you received labwork today, you will receive an invoice from [facility]. Please contact [facility] at [phone] with questions or concerns regarding your invoice.   Our billing staff will not be able to assist you with questions regarding bills from these companies.  You will be contacted with the lab results as soon as they are available. The fastest way to get your results is to activate your My Chart account. Instructions are located on the last page of this paperwork. If you have not heard from [place] regarding the results in 2 weeks, please contact this office.     Urinary Tract Infection, Adult A urinary tract infection (UTI) is an infection of any part of the urinary tract. The urinary tract includes the kidneys, ureters, bladder, and urethra. These organs make, store, and get rid of urine in the body. An upper UTI affects the ureters and kidneys. A lower UTI affects the bladder and urethra. What are the causes? Most urinary tract infections are caused by bacteria in your genital area around your urethra, where urine leaves your body. These bacteria grow and cause inflammation of your urinary tract. What increases the risk? You are more likely to develop this condition if: You have a urinary catheter that stays in place. You are not able to control when you urinate or have a bowel movement (incontinence). You are female and you: Use a spermicide or diaphragm for birth control. Have low estrogen levels. Are pregnant. You have certain genes that increase your risk. You  are sexually active. You take antibiotic medicines. You have a condition that causes your flow of urine to slow down, such as: An enlarged prostate, if you are female. Blockage in your urethra. A kidney stone. A nerve condition that affects your bladder control (neurogenic bladder). Not getting enough to drink, or not urinating often. You have certain medical conditions, such as: Diabetes. A weak disease-fighting system (immunesystem). Sickle cell disease. Gout. Spinal cord injury. What are the signs or symptoms? Symptoms of this condition include: Needing to urinate right away (urgency). Frequent urination. This may include small amounts of urine each time you urinate. Pain or burning with urination. Blood in the urine. Urine that smells bad or unusual. Trouble urinating. Cloudy urine. Vaginal discharge, if you are female. Pain in the abdomen or the lower back. You may also have: Vomiting or a decreased appetite. Confusion. Irritability or tiredness. A fever or chills. Diarrhea. The first symptom in older adults may be confusion. In some cases, they may not have any symptoms until the infection has worsened. How is this diagnosed? This condition is diagnosed based on your medical history and a physical exam. You may also have other tests, including: Urine tests. Blood tests. Tests for STIs (sexually transmitted infections). If you have had more than one UTI, a cystoscopy or imaging studies may be done to determine the cause of the infections. How is this treated? Treatment for this condition includes: Antibiotic medicine. Over-the-counter medicines to treat discomfort. Drinking enough water to stay hydrated. If you have  frequent infections or have other conditions such as a kidney stone, you may need to see a health care provider who specializes in the urinary tract (urologist). In rare cases, urinary tract infections can cause sepsis. Sepsis is a life-threatening condition  that occurs when the body responds to an infection. Sepsis is treated in the hospital with IV antibiotics, fluids, and other medicines. Follow these instructions at home: Medicines Take over-the-counter and prescription medicines only as told by your health care provider. If you were prescribed an antibiotic medicine, take it as told by your health care provider. Do not stop using the antibiotic even if you start to feel better. General instructions Make sure you: Empty your bladder often and completely. Do not hold urine for long periods of time. Empty your bladder after sex. Wipe from front to back after urinating or having a bowel movement if you are female. Use each tissue only one time when you wipe. Drink enough fluid to keep your urine pale yellow. Keep all follow-up visits. This is important. Contact a health care provider if: Your symptoms do not get better after 1-2 days. Your symptoms go away and then return. Get help right away if: You have severe pain in your back or your lower abdomen. You have a fever or chills. You have nausea or vomiting. Summary A urinary tract infection (UTI) is an infection of any part of the urinary tract, which includes the kidneys, ureters, bladder, and urethra. Most urinary tract infections are caused by bacteria in your genital area. Treatment for this condition often includes antibiotic medicines. If you were prescribed an antibiotic medicine, take it as told by your health care provider. Do not stop using the antibiotic even if you start to feel better. Keep all follow-up visits. This is important. This information is not intended to replace advice given to you by your health care provider. Make sure you discuss any questions you have with your health care provider. Document Revised: 01/20/2020 Document Reviewed: 01/20/2020 Elsevier Patient Education  2022 [employer].
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
## 2021-05-15 ENCOUNTER — Other Ambulatory Visit: Payer: Self-pay | Admitting: Family Medicine

## 2021-05-15 DIAGNOSIS — G2581 Restless legs syndrome: Secondary | ICD-10-CM

## 2021-05-17 LAB — URINE CULTURE
MICRO NUMBER:: 12673026
SPECIMEN QUALITY:: ADEQUATE

## 2021-05-23 ENCOUNTER — Encounter: Payer: Self-pay | Admitting: Family Medicine

## 2021-05-23 ENCOUNTER — Other Ambulatory Visit: Payer: Self-pay | Admitting: Family Medicine

## 2021-05-23 MED ORDER — ROPINIROLE HCL 3 MG PO TABS: 3.0000 mg | ORAL_TABLET | Freq: Every day | ORAL | 1 refills | Status: DC

## 2021-05-27 DIAGNOSIS — I10 Essential (primary) hypertension: Secondary | ICD-10-CM | POA: Diagnosis not present

## 2021-05-27 DIAGNOSIS — Z9689 Presence of other specified functional implants: Secondary | ICD-10-CM | POA: Diagnosis not present

## 2021-06-11 ENCOUNTER — Ambulatory Visit
Admission: RE | Admit: 2021-06-11 | Discharge: 2021-06-11 | Disposition: A | Discharge status: Home / Self Care | Payer: Medicare Other | Source: Ambulatory Visit | Attending: Family Medicine | Admitting: Family Medicine

## 2021-06-11 DIAGNOSIS — Z1231 Encounter for screening mammogram for malignant neoplasm of breast: Secondary | ICD-10-CM | POA: Diagnosis not present

## 2021-06-12 ENCOUNTER — Other Ambulatory Visit: Payer: Self-pay | Admitting: Family Medicine

## 2021-06-12 DIAGNOSIS — R928 Other abnormal and inconclusive findings on diagnostic imaging of breast: Secondary | ICD-10-CM

## 2021-07-02 ENCOUNTER — Ambulatory Visit
Admission: RE | Admit: 2021-07-02 | Discharge: 2021-07-02 | Disposition: A | Discharge status: Home / Self Care | Payer: Medicare Other | Source: Ambulatory Visit | Attending: Family Medicine | Admitting: Family Medicine

## 2021-07-02 ENCOUNTER — Other Ambulatory Visit: Payer: Self-pay | Admitting: Family Medicine

## 2021-07-02 ENCOUNTER — Ambulatory Visit: Payer: Medicare Other

## 2021-07-02 DIAGNOSIS — N6310 Unspecified lump in the right breast, unspecified quadrant: Secondary | ICD-10-CM

## 2021-07-02 DIAGNOSIS — R921 Mammographic calcification found on diagnostic imaging of breast: Secondary | ICD-10-CM

## 2021-07-02 DIAGNOSIS — R928 Other abnormal and inconclusive findings on diagnostic imaging of breast: Secondary | ICD-10-CM | POA: Diagnosis not present

## 2021-07-02 DIAGNOSIS — R922 Inconclusive mammogram: Secondary | ICD-10-CM | POA: Diagnosis not present

## 2021-07-10 ENCOUNTER — Ambulatory Visit
Admission: RE | Admit: 2021-07-10 | Discharge: 2021-07-10 | Disposition: A | Discharge status: Home / Self Care | Payer: Medicare Other | Source: Ambulatory Visit | Attending: Family Medicine | Admitting: Family Medicine

## 2021-07-10 DIAGNOSIS — N6310 Unspecified lump in the right breast, unspecified quadrant: Secondary | ICD-10-CM

## 2021-07-10 DIAGNOSIS — N6011 Diffuse cystic mastopathy of right breast: Secondary | ICD-10-CM | POA: Diagnosis not present

## 2021-07-10 DIAGNOSIS — R921 Mammographic calcification found on diagnostic imaging of breast: Secondary | ICD-10-CM

## 2021-07-15 DIAGNOSIS — M961 Postlaminectomy syndrome, not elsewhere classified: Secondary | ICD-10-CM | POA: Diagnosis not present

## 2021-07-15 DIAGNOSIS — G894 Chronic pain syndrome: Secondary | ICD-10-CM | POA: Diagnosis not present

## 2021-07-15 DIAGNOSIS — Z9689 Presence of other specified functional implants: Secondary | ICD-10-CM | POA: Diagnosis not present

## 2021-07-22 ENCOUNTER — Other Ambulatory Visit: Payer: Medicare Other

## 2021-07-26 ENCOUNTER — Ambulatory Visit (INDEPENDENT_AMBULATORY_CARE_PROVIDER_SITE_OTHER): Payer: Medicare Other | Admitting: Family Medicine

## 2021-07-26 VITALS — BP 100/64 | HR 60 | Temp 97.3°F | Resp 16 | Wt 153.6 lb

## 2021-07-26 DIAGNOSIS — R600 Localized edema: Secondary | ICD-10-CM | POA: Diagnosis not present

## 2021-07-26 DIAGNOSIS — R252 Cramp and spasm: Secondary | ICD-10-CM

## 2021-07-26 DIAGNOSIS — G4700 Insomnia, unspecified: Secondary | ICD-10-CM | POA: Diagnosis not present

## 2021-07-26 DIAGNOSIS — R6883 Chills (without fever): Secondary | ICD-10-CM

## 2021-07-26 LAB — CBC WITH DIFFERENTIAL/PLATELET
Basophils Absolute: 0 10*3/uL (ref 0.0–0.1)
Basophils Relative: 0.4 % (ref 0.0–3.0)
Eosinophils Absolute: 0.2 10*3/uL (ref 0.0–0.7)
Eosinophils Relative: 2.3 % (ref 0.0–5.0)
HCT: 40 % (ref 36.0–46.0)
Hemoglobin: 13.3 g/dL (ref 12.0–15.0)
Lymphocytes Relative: 19.6 % (ref 12.0–46.0)
Lymphs Abs: 1.8 10*3/uL (ref 0.7–4.0)
MCHC: 33.1 g/dL (ref 30.0–36.0)
MCV: 88.4 fl (ref 78.0–100.0)
Monocytes Absolute: 0.7 10*3/uL (ref 0.1–1.0)
Monocytes Relative: 7.9 % (ref 3.0–12.0)
Neutro Abs: 6.4 10*3/uL (ref 1.4–7.7)
Neutrophils Relative %: 69.8 % (ref 43.0–77.0)
Platelets: 191 10*3/uL (ref 150.0–400.0)
RBC: 4.53 Mil/uL (ref 3.87–5.11)
RDW: 14.3 % (ref 11.5–15.5)
WBC: 9.1 10*3/uL (ref 4.0–10.5)

## 2021-07-26 LAB — TSH: TSH: 2.69 u[IU]/mL (ref 0.35–5.50)

## 2021-07-26 LAB — BASIC METABOLIC PANEL
BUN: 26 mg/dL — ABNORMAL HIGH (ref 6–23)
CO2: 33 mEq/L — ABNORMAL HIGH (ref 19–32)
Calcium: 9.4 mg/dL (ref 8.4–10.5)
Chloride: 102 mEq/L (ref 96–112)
Creatinine, Ser: 1.05 mg/dL (ref 0.40–1.20)
GFR: 51.97 mL/min — ABNORMAL LOW (ref >60.00)
Glucose, Bld: 82 mg/dL (ref 70–99)
Potassium: 4.2 mEq/L (ref 3.5–5.1)
Sodium: 139 mEq/L (ref 135–145)

## 2021-07-26 LAB — HEPATIC FUNCTION PANEL
ALT: 12 U/L (ref 0–35)
AST: 19 U/L (ref 0–37)
Albumin: 4.2 g/dL (ref 3.5–5.2)
Alkaline Phosphatase: 77 U/L (ref 39–117)
Bilirubin, Direct: 0.1 mg/dL (ref 0.0–0.3)
Total Bilirubin: 0.7 mg/dL (ref 0.2–1.2)
Total Protein: 6.2 g/dL (ref 6.0–8.3)

## 2021-07-26 LAB — MAGNESIUM: Magnesium: 1.9 mg/dL (ref 1.5–2.5)

## 2021-07-26 MED ORDER — BELSOMRA 10 MG PO TABS: 10.0000 mg | ORAL_TABLET | Freq: Every evening | ORAL | 3 refills | Status: DC | PRN

## 2021-07-26 NOTE — Progress Notes (Signed)
Subjective:    Patient ID: Melissa Jordan, female    DOB: 1945/12/21, 76 y.o.   MRN: [id number]  HPI Insomnia- pt reports she is able to fall asleep w/o any problem but then at 2-3am she is 'wide awake' and she is not able to go back to sleep.  Reports being 'tired all the time'  Pt has never been on a sleep medication.  Has not tried Melatonin.  Leg cramps- pt reports they are 'horrible' and cause her to jump out of bed at night and will then 'hurt for days'  Edema- pt reports feet are swelling.  Sxs are intermittent.  Has had vein studies.  Does not eat a diet high in salt.  Chills- pt reports she will have shaking chills.  Husband thought she was having a seizure one night.  Reports hands and feet are 'ice cold all the time'.   Review of Systems For ROS see HPI   This visit occurred during the SARS-CoV-2 public health emergency.  Safety protocols were in place, including screening questions prior to the visit, additional usage of staff PPE, and extensive cleaning of exam room while observing appropriate contact time as indicated for disinfecting solutions.      Objective:   Physical Exam Vitals reviewed.  Constitutional:      General: She is not in acute distress.    Appearance: Normal appearance. She is well-developed. She is not ill-appearing.  HENT:     Head: Normocephalic and atraumatic.  Eyes:     Conjunctiva/sclera: Conjunctivae normal.     Pupils: Pupils are equal, round, and reactive to light.  Neck:     Thyroid: No thyromegaly.  Cardiovascular:     Rate and Rhythm: Normal rate and regular rhythm.     Pulses: Normal pulses.     Heart sounds: Normal heart sounds. No murmur heard. Pulmonary:     [place]: Pulmonary effort is normal. No respiratory distress.     Breath sounds: Normal breath sounds.  Abdominal:     General: There is no distension.     Palpations: Abdomen is soft.     Tenderness: There is no abdominal tenderness.  Musculoskeletal:     Cervical  back: Normal range of motion and neck supple.     Right lower leg: No edema.     Left lower leg: No edema.  Lymphadenopathy:     Cervical: No cervical adenopathy.  Skin:    General: Skin is warm and dry.  Neurological:     Mental Status: She is alert and oriented to person, place, and time.  Psychiatric:        Mood and Affect: Mood normal.        Behavior: Behavior normal.          Assessment & Plan:   Insomnia- deteriorated.  Pt has struggled w/ sleep due to RLS but she states it is worse now than ever.  She is able to fall asleep but cannot stay asleep.  She has not tried Melatonin b/c she states that she is tired at the appropriate time and able to sleep- she just wakes nightly between 2-3am.  Rather than trying an excessively sedating sleep med, will start Belsomra and see if she is able to improve her sleep patterns.  Pt expressed understanding and is in agreement w/ plan.   Leg cramps- new.  Pt reports these are occurring nightly in feet and legs.  Cause her to 'jump out of bed'.  Encouraged  increased water intake, a diet high in potassium.  Will check electrolytes and Mg level to r/o underlying metabolic cause.  Shaking  chills- new.  Pt denies feeling poorly.  No fevers.  Just feels 'cold all the time'.  Check TSH.  Discussed the contribution of Eliquis to being cold.  Will follow.  Edema- new.  Not present on exam today.  Pt states there doesn't seem to be a pattern to her sxs.  Encouraged increased water intake to dilute any salt she may be consuming.  Will follow.

## 2021-07-26 NOTE — Patient Instructions (Signed)
We'll notify you of your lab results and determine the next steps Make sure you are drinking plenty of water and eating a diet high in potassium (leafy greens, citrus fruits, bananas) to help w/ leg cramps START the Belsomra nightly to help turn down the wake and let you rest Dress in layers to keep warm.  The blood thinner doesn't help in this situation.... Call with any questions or concerns Hang in there!

## 2021-07-29 ENCOUNTER — Encounter: Payer: Self-pay | Admitting: Family Medicine

## 2021-08-05 ENCOUNTER — Telehealth: Payer: Self-pay | Admitting: Family Medicine

## 2021-08-05 ENCOUNTER — Other Ambulatory Visit: Payer: Self-pay | Admitting: Family Medicine

## 2021-08-05 DIAGNOSIS — E039 Hypothyroidism, unspecified: Secondary | ICD-10-CM

## 2021-08-05 NOTE — Telephone Encounter (Signed)
Pt aware.

## 2021-08-05 NOTE — Telephone Encounter (Signed)
Pt called in stating that she is unhappy with Dr. Everardo All. She wanted to know if Dr. Beverely Low would place a referral for her to see Dr. Talmage Nap.  Please advise

## 2021-08-05 NOTE — Telephone Encounter (Signed)
Referral to Dr. Balan placed. 

## 2021-09-19 DIAGNOSIS — M8580 Other specified disorders of bone density and structure, unspecified site: Secondary | ICD-10-CM | POA: Diagnosis not present

## 2021-09-19 DIAGNOSIS — R946 Abnormal results of thyroid function studies: Secondary | ICD-10-CM | POA: Diagnosis not present

## 2021-09-19 DIAGNOSIS — E039 Hypothyroidism, unspecified: Secondary | ICD-10-CM | POA: Diagnosis not present

## 2021-09-19 DIAGNOSIS — I10 Essential (primary) hypertension: Secondary | ICD-10-CM | POA: Diagnosis not present

## 2021-09-23 DIAGNOSIS — I10 Essential (primary) hypertension: Secondary | ICD-10-CM | POA: Diagnosis not present

## 2021-09-23 DIAGNOSIS — M961 Postlaminectomy syndrome, not elsewhere classified: Secondary | ICD-10-CM | POA: Diagnosis not present

## 2021-10-04 ENCOUNTER — Other Ambulatory Visit: Payer: Medicare Other

## 2021-10-04 ENCOUNTER — Other Ambulatory Visit: Payer: Self-pay | Admitting: Family Medicine

## 2021-10-04 DIAGNOSIS — M8580 Other specified disorders of bone density and structure, unspecified site: Secondary | ICD-10-CM

## 2021-10-10 DIAGNOSIS — Z9689 Presence of other specified functional implants: Secondary | ICD-10-CM | POA: Diagnosis not present

## 2021-10-10 DIAGNOSIS — G894 Chronic pain syndrome: Secondary | ICD-10-CM | POA: Diagnosis not present

## 2021-10-10 DIAGNOSIS — M961 Postlaminectomy syndrome, not elsewhere classified: Secondary | ICD-10-CM | POA: Diagnosis not present
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
## 2021-11-11 ENCOUNTER — Encounter: Payer: Self-pay | Admitting: Student

## 2021-11-11 ENCOUNTER — Ambulatory Visit (INDEPENDENT_AMBULATORY_CARE_PROVIDER_SITE_OTHER): Payer: Medicare Other | Admitting: Student

## 2021-11-11 VITALS — BP 100/68 | HR 63 | Ht 63.0 in | Wt 156.0 lb

## 2021-11-11 DIAGNOSIS — I10 Essential (primary) hypertension: Secondary | ICD-10-CM

## 2021-11-11 DIAGNOSIS — I480 Paroxysmal atrial fibrillation: Secondary | ICD-10-CM | POA: Diagnosis not present

## 2021-11-11 MED ORDER — LOSARTAN POTASSIUM 50 MG PO TABS: 50.0000 mg | ORAL_TABLET | Freq: Every day | ORAL | 3 refills | Status: DC

## 2021-11-11 NOTE — Patient Instructions (Signed)
Medication Instructions:  Your physician recommends that you continue on your current medications as directed. Please refer to the Current Medication list given to you today.  *If you need a refill on your cardiac medications before your next appointment, please call your pharmacy*   Lab Work: None If you have labs (blood work) drawn today and your tests are completely normal, you will receive your results only by: MyChart Message (if you have MyChart) OR A paper copy in the mail If you have any lab test that is abnormal or we need to change your treatment, we will call you to review the results.   Follow-Up: At [facility], you and your health needs are our priority.  As part of our continuing mission to provide you with exceptional heart care, we have created designated Provider Care Teams.  These Care Teams include your primary Cardiologist (physician) and Advanced Practice Providers (APPs -  Physician Assistants and Nurse Practitioners) who all work together to provide you with the care you need, when you need it.   Your next appointment:   1 year(s)  The format for your next appointment:   In Person  Provider:   Will Camnitz, MD{   

## 2021-11-25 ENCOUNTER — Ambulatory Visit (INDEPENDENT_AMBULATORY_CARE_PROVIDER_SITE_OTHER): Payer: Medicare Other

## 2021-11-25 ENCOUNTER — Ambulatory Visit: Payer: Medicare Other | Admitting: Podiatry

## 2021-11-25 DIAGNOSIS — M79672 Pain in left foot: Secondary | ICD-10-CM

## 2021-11-25 DIAGNOSIS — B070 Plantar wart: Secondary | ICD-10-CM

## 2021-11-25 DIAGNOSIS — M76822 Posterior tibial tendinitis, left leg: Secondary | ICD-10-CM

## 2021-11-25 NOTE — Patient Instructions (Signed)
Take dressing off in 8 hours and wash the foot with soap and water. If it is hurting or becomes uncomfortable before the 8 hours, go ahead and remove the bandage and wash the area.  If it blisters, apply antibiotic ointment and a band-aid.  Monitor for any signs/symptoms of infection. Call the office immediately if any occur or go directly to the emergency room. Call with any questions/concerns.   

## 2021-11-25 NOTE — Progress Notes (Signed)
Chronic Care Management Pharmacy Note  11/28/2021 Name:  Melissa Jordan MRN:  [id number] DOB:  [birth year]  Summary: Now seeing Endo for thyroid concerns, most recent labs were all normal. No changes to meds, continue to monitor BP at home. DEXA scheduled for October.  Subjective: Melissa Jordan is an 76 y.o. year old female who is a primary patient of Tabori, Aundra Millet, MD.  The CCM team was consulted for assistance with disease management and care coordination needs.    Engaged with patient by telephone for follow up visit in response to provider referral for pharmacy case management and/or care coordination services.   Consent to Services:  The patient was given information about Chronic Care Management services, agreed to services, and gave verbal consent prior to initiation of services.  Please see initial visit note for detailed documentation.   Patient Care Team: Midge Minium, MD as PCP - General (Family Medicine) Donato Heinz, MD as PCP - Cardiology (Cardiology) Constance Haw, MD as PCP - Electrophysiology (Cardiology) Glenna Fellows, MD as Attending Physician (Neurosurgery) Vickey Huger, MD as Consulting Physician (Orthopedic Surgery) Galvin Proffer, [place] (Optometry) Levin Erp (Dentistry) Charolette Forward, MD as Consulting Physician (Cardiology) Edythe Clarity, [facility] (Pharmacist)  Hospital visits: None in previous 6 months  Objective:  Lab Results  Component Value Date   CREATININE 1.05 07/26/2021   CREATININE 0.84 01/03/2021   CREATININE 0.85 10/17/2020    No results found for: "HGBA1C" Last diabetic Eye exam: No results found for: "HMDIABEYEEXA"  Last diabetic Foot exam: No results found for: "HMDIABFOOTEX"      Component Value Date/Time   CHOL 165 10/17/2020 0937   CHOL 183 01/31/2020 0927   CHOL 196 04/05/2019 1043   TRIG 81.0 10/17/2020 0937   TRIG 114.0 01/31/2020 0927   TRIG 136.0 04/05/2019 1043   TRIG 110  06/24/2006 0842   HDL 61.10 10/17/2020 0937   HDL 59.70 01/31/2020 0927   HDL 61.00 04/05/2019 1043   CHOLHDL 3 10/17/2020 0937   VLDL 16.2 10/17/2020 0937   LDLCALC 88 10/17/2020 0937   LDLCALC 100 (H) 01/31/2020 0927   LDLCALC 108 (H) 04/05/2019 1043   LDLDIRECT 133.8 03/29/2012 1110   LDLDIRECT 142.6 01/29/2011 1022   LDLDIRECT 141.6 08/02/2008 0946       Latest Ref Rng & Units 07/26/2021   12:10 PM 10/17/2020    9:37 AM 01/31/2020    9:27 AM  Hepatic Function  Total Protein 6.0 - 8.3 g/dL 6.2  6.2  6.0   Albumin 3.5 - 5.2 g/dL 4.2  4.2  4.0   AST 0 - 37 U/L _0 ALT 0 - 35 U/L _1 Alk Phosphatase 39 - 117 U/L 77  92  80   Total Bilirubin 0.2 - 1.2 mg/dL 0.7  0.7  0.5   Bilirubin, Direct 0.0 - 0.3 mg/dL 0.1  0.1  0.1     Lab Results  Component Value Date/Time   TSH 2.69 07/26/2021 12:10 PM   TSH 4.16 03/28/2021 09:20 AM   FREET4 0.64 03/28/2021 09:20 AM   FREET4 1.67 (H) 01/02/2021 09:11 AM       Latest Ref Rng & Units 07/26/2021   12:10 PM 10/17/2020    9:37 AM 08/02/2020    7:00 AM  CBC  WBC 4.0 - 10.5 K/uL 9.1  11.8  7.2   Hemoglobin 12.0 - 15.0 g/dL 13.3  12.6  13.4   Hematocrit 36.0 - 46.0 % 40.0  38.0  40.7   Platelets 150.0 - 400.0 K/uL 191.0  167.0  215     Lab Results  Component Value Date/Time   VD25OH 62.74 10/17/2020 09:37 AM   VD25OH 56.69 01/31/2020 09:27 AM    Clinical ASCVD:  The 10-year ASCVD risk score (Arnett DK, et al., 2019) is: 13.2%   Values used to calculate the score:     Age: 73 years     Sex: Female     Is Non-Hispanic African American: No     Diabetic: No     Tobacco smoker: No     Systolic Blood Pressure: 151 mmHg     Is BP treated: Yes     HDL Cholesterol: 61.1 mg/dL     Total Cholesterol: 165 mg/dL   Social History   Tobacco Use  Smoking Status Never  Smokeless Tobacco Never   BP Readings from Last 3 Encounters:  11/11/21 100/68  07/26/21 100/64  05/14/21 127/64   Pulse Readings from Last 3  Encounters:  11/11/21 63  07/26/21 60  05/14/21 65   Wt Readings from Last 3 Encounters:  11/11/21 156 lb (70.8 kg)  07/26/21 153 lb 9.6 oz (69.7 kg)  05/14/21 151 lb 12.8 oz (68.9 kg)   BMI Readings from Last 3 Encounters:  11/11/21 27.63 kg/m  07/26/21 27.21 kg/m  05/14/21 26.89 kg/m    Assessment: Review of patient past medical history, allergies, medications, health status, including review of consultants reports, laboratory and other test data, was performed as part of comprehensive evaluation and provision of chronic care management services.   SDOH:  (Social Determinants of Health) assessments and interventions performed: Yes   CCM Care Plan  Allergies  Allergen Reactions   Lyrica [Pregabalin] Palpitations and Other (See Comments)    "thought I was going to have a heart attack; heart started pounding so hard, I couldn't catch my breath" (06/28/2012)   Meclizine Other (See Comments)    "what was in my mind to say wasn't what came out to say; I was kind of in another world" (06/28/2012)   Penicillins Itching and Rash    Has patient had a PCN reaction causing immediate rash, facial/tongue/throat swelling, SOB or lightheadedness with hypotension: No Has patient had a PCN reaction causing severe rash involving mucus membranes or skin necrosis: No Has patient had a PCN reaction that required hospitalization No Has patient had a PCN reaction occurring within the last 10 years:   # # YES # #  If all of the above answers are "NO", then may proceed with Cephalosporin use.    Poison Sumac Extract Swelling, Rash and Other (See Comments)    Blisters   Propoxyphene Nausea And Vomiting   Bactrim [Sulfamethoxazole-Trimethoprim] Rash   Codeine Nausea Only   Morphine Sulfate Rash   Propoxyphene N-Acetaminophen Nausea And Vomiting    "Darvocet"   Vancomycin Rash    Medications Reviewed Today     Reviewed by Edythe Clarity, [facility] (Pharmacist) on 11/28/21 at 0744  Med List Status:  <None>   Medication Order Taking? Sig Documenting Provider Last Dose Status Informant  acetaminophen (TYLENOL) 500 MG tablet [id number] Yes Take 1,000 mg by mouth every 6 (six) hours as needed for moderate pain.  [provider] Taking Active Self  apixaban (ELIQUIS) 5 MG TABS tablet [id number] Yes Take 1 tablet (5 mg total) by mouth 2 (two) times daily. Donato Heinz, MD Taking Active  Self  azelastine (ASTELIN) 0.1 % nasal spray [id number] Yes Place 1 spray into both nostrils 2 (two) times daily. Use in each nostril as directed Maximiano Coss, NP Taking Active   CALCIUM-VITAMIN D PO [id number] Yes Take 1 tablet by mouth daily.  [provider] Taking Active Self  Cholecalciferol (VITAMIN D) 125 MCG (5000 UT) CAPS [id number] Yes Take 5,000 Units by mouth daily. [provider] Taking Active Self  estradiol (ESTRACE) 1 MG tablet [id number] Yes TAKE 1 TABLET BY MOUTH  DAILY Midge Minium, MD Taking Active   fluticasone [facility]) 50 MCG/ACT nasal spray [id number] Yes Place 2 sprays into both nostrils daily as needed for rhinitis or allergies. [provider] Taking Active Self  levothyroxine (SYNTHROID) 25 MCG tablet [id number] Yes Take 1 tablet (25 mcg total) by mouth daily. Midge Minium, MD Taking Active   loratadine (CLARITIN) 10 MG tablet [id number] Yes Take 10 mg by mouth daily. [provider] Taking Active Self  losartan (COZAAR) 50 MG tablet [id number] Yes Take 1 tablet (50 mg total) by mouth daily. Shirley Friar, PA-C Taking Active   magnesium oxide (MAG-OX) 400 MG tablet [id number] Yes Take 400 mg by mouth daily. [provider] Taking Active Self  metoprolol tartrate (LOPRESSOR) 25 MG tablet [id number] Yes TAKE 1 TABLET BY MOUTH  TWICE DAILY Midge Minium, MD Taking Active   Polyethyl Glycol-Propyl Glycol (SYSTANE) 0.4-0.3 % SOLN [id number] Yes Place 1 drop into both eyes daily. [provider] Taking  Active Self  rOPINIRole (REQUIP) 3 MG tablet [id number] Yes Take 1 tablet (3 mg total) by mouth at bedtime. Midge Minium, MD Taking Active   traMADol Veatrice Bourbon) 50 MG tablet [id number] Yes Take 50 mg by mouth every 6 (six) hours as needed for moderate pain or severe pain. 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   Traumatic arthritis of right ankle    Pain in right ankle and joints of right foot 08/05/2018   A-fib ([place]) 03/10/2017   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/2016   RLS (restless legs syndrome) 01/11/2015   Osteopenia 05/15/2014   Hyperlipidemia 05/24/2013   Routine general medical examination at a health care facility 03/29/2012   POSTMENOPAUSAL SYNDROME 09/26/2009   URINARY URGENCY 09/26/2009   ANEMIA 01/09/2009   Back pain of lumbar region with sciatica 01/09/2009   LAMINECTOMY, LUMBAR, HX OF 08/09/2008   Hypothyroid 03/03/2007   HYPERTENSION, BENIGN 03/03/2007   GERD 02/10/2007    Immunization History  Administered Date(s) Administered   Fluad Quad(high Dose 65+) 04/05/2019, 05/14/2021   Influenza, High Dose Seasonal PF 05/30/2014, 03/31/2018   Influenza,inj,Quad PF,6+ Mos 05/24/2013, 05/21/2015, 05/14/2016, 03/10/2017   PFIZER(Purple Top)SARS-COV-2 Vaccination 08/19/2019, 09/09/2019   Pneumococcal Polysaccharide-23 05/24/2010   Td 06/23/2000   Tdap 08/22/2011    Conditions to be addressed/monitored: HLD HTN Hypothyroid RLS Afib  Care Plan : ccm pharmacy care plan  Updates made by Edythe Clarity, RPH since  11/28/2021 12:00 AM     Problem: hld htn hypothyroid rls afib  Priority: High     Long-Range Goal: disease management   Start Date: 04/10/2021  Expected End Date: 04/10/2022  Recent Progress: On track  Priority: High  Note:   Current Barriers: Fluctuating thyroid levels  Pharmacist Clinical Goal(s):  Patient will verbalize ability to afford treatment regimen through collaboration with PharmD and provider.   Interventions: 1:1 collaboration with Midge Minium, MD regarding development and update of comprehensive plan of care as evidenced by provider attestation and co-signature Inter-disciplinary care team collaboration (see longitudinal plan of care) Comprehensive medication review performed; medication list updated in electronic medical record  Hypertension (BP goal <130/80) -Controlled -Current treatment: Losartan 50 mg once daily Appropriate, Effective, Safe, Accessible Metoprolol tartrate 25 mg twice daily Appropriate, Effective, Safe, Accessible -Medications previously tried: n/a  -Current home readings: <130/80 -Current dietary habits: high in vegetables -Current exercise habits: stays active, current working on a remodel -Denies hypotensive/hypertensive symptoms -Educated on Importance of home blood pressure monitoring; -Counseled to monitor BP at home 1-2x/wk, document, and provide log at future appointments -Recommended to continue current medication  Update 11/27/21 110-120/50s-70s Denies dizziness. No changes to BP meds needed at this time. Continue home monitoring.   Hyperlipidemia: (LDL goal < 100) -Controlled -Current treatment: No current medication -Medications previously tried: n/a  -Current exercise habits: see htn -Educated on Cholesterol goals;   Osteopenia (Goal prevent fractures, ensure appropriate supplementation) -Controlled -Last DEXA Scan: due. -Patient is not a candidate for pharmacologic treatment - needs updated DEXA, pt aware  of order placed  -Current treatment  Calcium-vitamin d3 supplementation  -Medications previously tried: n/a  -Recommend [phone] units of vitamin D daily. Recommend 1200 mg of calcium daily from dietary and supplemental sources. Recommend weight-bearing and muscle strengthening exercises for building and maintaining bone density. -Recommended to continue current medication  Update 11/27/21 She has DEXA scheduled for October. For now, continue calcium and Vit D supplementation. Pending DEXA results consider treatment. Fall precautions and pick up clutter off of the floor to prevent falls.     Hypothyroidism (Goal: ensure medication safety) -Not ideally controlled, recent had normal TSH but elevated T3/T4. Pt had been taking biotin and was though to have interfere with assay (falsely low TSH, falsely high TSH). Had been advised to resume tx with levothyroxine by dr Loanne Drilling.  -Current treatment  Levothyroxine 25 mcg once daily  Appropriate, Effective, Safe, Accessible -Medications previously tried: previously on higher levothyroxine dose, otherwise none.  -Recommended to continue current medication  Update 11/27/21 She has stopped the biotin. She is taking medication appropriately. Now seeing endocrinologist for fluctuating thyroid levels. Most recent visit TSH and Free T4 were all normal. No changes needed at this time, continue regular follow up with Endo.  Patient Goals/Self-Care Activities Patient will:  - target a minimum of 150 minutes of moderate intensity exercise weekly  Medication Assistance: None needed at this time       Patient's preferred pharmacy is:  [facility] [address] ([address]), [place] [address] [place] [address] Phone: [phone] Fax: [phone]  [facility] - [place], [facility] [address] [place] [address] Phone: [phone] Fax:  [place], [place] [address] [place] [address] Phone: [phone] Fax: [phone]  [facility] ([facility] ) - [place],  [place] - [place] [address] Phone: [phone] Fax: [phone]    Pt endorses 100% compliance  Follow Up:  Patient agrees to Care Plan and Follow-up. Plan: HC 5 month BP. Pharmacist 8 month f/u or sooner if needed.  Future Appointments  Date Time Provider [place]  12/11/2021  2:15 PM Vickie Epley, MD [facility]  03/27/2022 10:30 AM [facility] DX DEXA 1 [facility]   Beverly Milch, PharmD Clinical Pharmacist  [facility] [phone]

## 2021-11-27 ENCOUNTER — Ambulatory Visit: Payer: Medicare Other | Admitting: Pharmacist

## 2021-11-27 DIAGNOSIS — I10 Essential (primary) hypertension: Secondary | ICD-10-CM

## 2021-11-27 DIAGNOSIS — E039 Hypothyroidism, unspecified: Secondary | ICD-10-CM

## 2021-11-28 NOTE — Patient Instructions (Addendum)
Visit Information   Goals Addressed             This Visit's Progress    Exercise 3x per week (30 min per time)   On track    Get back to walking. Will increase walking as tolerated.        Patient Care Plan: ccm pharmacy care plan     Problem Identified: hld htn hypothyroid rls afib   Priority: High     Long-Range Goal: disease management   Start Date: 04/10/2021  Expected End Date: 04/10/2022  Recent Progress: On track  Priority: High  Note:   Current Barriers: Fluctuating thyroid levels  Pharmacist Clinical Goal(s):  Patient will verbalize ability to afford treatment regimen through collaboration with PharmD and provider.   Interventions: 1:1 collaboration with Sheliah Hatch, MD regarding development and update of comprehensive plan of care as evidenced by provider attestation and co-signature Inter-disciplinary care team collaboration (see longitudinal plan of care) Comprehensive medication review performed; medication list updated in electronic medical record  Hypertension (BP goal <130/80) -Controlled -Current treatment: Losartan 50 mg once daily Appropriate, Effective, Safe, Accessible Metoprolol tartrate 25 mg twice daily Appropriate, Effective, Safe, Accessible -Medications previously tried: n/a  -Current home readings: <130/80 -Current dietary habits: high in vegetables -Current exercise habits: stays active, current working on a remodel -Denies hypotensive/hypertensive symptoms -Educated on Importance of home blood pressure monitoring; -Counseled to monitor BP at home 1-2x/wk, document, and provide log at future appointments -Recommended to continue current medication  Update 11/27/21 110-120/50s-70s Denies dizziness. No changes to BP meds needed at this time. Continue home monitoring.   Hyperlipidemia: (LDL goal < 100) -Controlled -Current treatment: No current medication -Medications previously tried: n/a  -Current exercise habits:  see htn -Educated on Cholesterol goals;   Osteopenia (Goal prevent fractures, ensure appropriate supplementation) -Controlled -Last DEXA Scan: due. -Patient is not a candidate for pharmacologic treatment - needs updated DEXA, pt aware of order placed  -Current treatment  Calcium-vitamin d3 supplementation  -Medications previously tried: n/a  -Recommend [phone] units of vitamin D daily. Recommend 1200 mg of calcium daily from dietary and supplemental sources. Recommend weight-bearing and muscle strengthening exercises for building and maintaining bone density. -Recommended to continue current medication  Update 11/27/21 She has DEXA scheduled for October. For now, continue calcium and Vit D supplementation. Pending DEXA results consider treatment. Fall precautions and pick up clutter off of the floor to prevent falls.     Hypothyroidism (Goal: ensure medication safety) -Not ideally controlled, recent had normal TSH but elevated T3/T4. Pt had been taking biotin and was though to have interfere with assay (falsely low TSH, falsely high TSH). Had been advised to resume tx with levothyroxine by dr Everardo All.  -Current treatment  Levothyroxine 25 mcg once daily  Appropriate, Effective, Safe, Accessible -Medications previously tried: previously on higher levothyroxine dose, otherwise none.  -Recommended to continue current medication  Update 11/27/21 She has stopped the biotin. She is taking medication appropriately. Now seeing endocrinologist for fluctuating thyroid levels. Most recent visit TSH and Free T4 were all normal. No changes needed at this time, continue regular follow up with Endo.  Patient Goals/Self-Care Activities Patient will:  - target a minimum of 150 minutes of moderate intensity exercise weekly  Medication Assistance: None needed at this time        The patient verbalized understanding of instructions, educational materials, and care plan provided today and  DECLINED offer to receive copy of patient instructions, educational materials, and  care plan.  Telephone follow up appointment with pharmacy team member scheduled for: 6 months  Erroll Luna, [facility]  Willa Frater, PharmD Clinical Pharmacist  [facility] [phone]
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
## 2021-12-03 DIAGNOSIS — M76822 Posterior tibial tendinitis, left leg: Secondary | ICD-10-CM | POA: Diagnosis not present

## 2021-12-03 DIAGNOSIS — M25572 Pain in left ankle and joints of left foot: Secondary | ICD-10-CM | POA: Diagnosis not present

## 2021-12-06 ENCOUNTER — Encounter: Payer: Self-pay | Admitting: Family

## 2021-12-06 ENCOUNTER — Telehealth (INDEPENDENT_AMBULATORY_CARE_PROVIDER_SITE_OTHER): Payer: Medicare Other | Admitting: Family

## 2021-12-06 VITALS — BP 118/68 | HR 54 | Temp 99.0°F | Wt 144.0 lb

## 2021-12-06 DIAGNOSIS — J3489 Other specified disorders of nose and nasal sinuses: Secondary | ICD-10-CM | POA: Diagnosis not present

## 2021-12-06 DIAGNOSIS — B9689 Other specified bacterial agents as the cause of diseases classified elsewhere: Secondary | ICD-10-CM | POA: Diagnosis not present

## 2021-12-06 DIAGNOSIS — J329 Chronic sinusitis, unspecified: Secondary | ICD-10-CM

## 2021-12-06 MED ORDER — METHYLPREDNISOLONE 4 MG PO TBPK: ORAL_TABLET | ORAL | 0 refills | Status: DC

## 2021-12-06 MED ORDER — DOXYCYCLINE HYCLATE 100 MG PO TABS: 100.0000 mg | ORAL_TABLET | Freq: Two times a day (BID) | ORAL | 0 refills | Status: DC

## 2021-12-10 DIAGNOSIS — E039 Hypothyroidism, unspecified: Secondary | ICD-10-CM | POA: Diagnosis not present

## 2021-12-10 NOTE — Progress Notes (Signed)
Virtual Visit via Telephone Note  I connected with Melissa Jordan on 12/10/21 at  3:30 PM EDT by telephone and verified that I am speaking with the correct person using two identifiers.  Location: Patient: Home Provider: Lacey Jensen   I discussed the limitations, risks, security and privacy concerns of performing an evaluation and management service by telephone and the availability of in person appointments. I also discussed with the patient that there may be a patient responsible charge related to this service. The patient expressed understanding and agreed to proceed.   History of Present Illness:Patient is in today with c/o sinus congestion, sinus pressure, ear pain, that is worsening. She has been using Claritin, Tylenol and Astelin that has not helped. Has a history of sinus infections.     Observations/Objective: A&O, NAD   Assessment and Plan: Melissa Jordan was seen today for sinus problem.  Diagnoses and all orders for this visit:  Bacterial sinusitis  Sinus pressure  Other orders -     doxycycline (VIBRA-TABS) 100 MG tablet; Take 1 tablet (100 mg total) by mouth 2 (two) times daily. -     methylPREDNISolone (MEDROL DOSEPAK) 4 MG TBPK tablet; As directed      Follow Up Instructions: Call the office if symptoms worsen or persist. Recheck as scheduled and sooner as needed.     I discussed the assessment and treatment plan with the patient. The patient was provided an opportunity to ask questions and all were answered. The patient agreed with the plan and demonstrated an understanding of the instructions.   The patient was advised to call back or seek an in-person evaluation if the symptoms worsen or if the condition fails to improve as anticipated.  I provided 25 minutes of non-face-to-face time during this encounter.   Eulis Foster, FNP

## 2021-12-11 ENCOUNTER — Encounter: Payer: Self-pay | Admitting: Cardiology

## 2021-12-11 ENCOUNTER — Ambulatory Visit: Payer: Medicare Other | Admitting: Cardiology

## 2021-12-11 ENCOUNTER — Encounter: Payer: Self-pay | Admitting: *Deleted

## 2021-12-11 ENCOUNTER — Telehealth: Payer: Medicare Other

## 2021-12-11 VITALS — BP 128/78 | HR 67 | Ht 63.0 in | Wt 153.0 lb

## 2021-12-11 DIAGNOSIS — I480 Paroxysmal atrial fibrillation: Secondary | ICD-10-CM | POA: Diagnosis not present

## 2021-12-11 DIAGNOSIS — I10 Essential (primary) hypertension: Secondary | ICD-10-CM

## 2021-12-11 DIAGNOSIS — Z01818 Encounter for other preprocedural examination: Secondary | ICD-10-CM | POA: Diagnosis not present
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
## 2021-12-11 NOTE — Patient Instructions (Signed)
Medication Instructions:  Your physician recommends that you continue on your current medications as directed. Please refer to the Current Medication list given to you today. *If you need a refill on your cardiac medications before your next appointment, please call your pharmacy*  Lab Work: None. If you have labs (blood work) drawn today and your tests are completely normal, you will receive your results only by: MyChart Message (if you have MyChart) OR A paper copy in the mail If you have any lab test that is abnormal or we need to change your treatment, we will call you to review the results.  Testing/Procedures: Your physician has requested that you have an echocardiogram. Echocardiography is a painless test that uses sound waves to create images of your heart. It provides your doctor with information about the size and shape of your heart and how well your heart's chambers and valves are working. This procedure takes approximately one hour. There are no restrictions for this procedure.   Your physician has requested that you have cardiac CT. Cardiac computed tomography (CT) is a painless test that uses an x-ray machine to take clear, detailed pictures of your heart. For further information please visit www.[email or web address].org. Please follow instruction sheet as given.   Follow-Up: At [facility], you and your health needs are our priority.  As part of our continuing mission to provide you with exceptional heart care, we have created designated Provider Care Teams.  These Care Teams include your primary Cardiologist (physician) and Advanced Practice Providers (APPs -  Physician Assistants and Nurse Practitioners) who all work together to provide you with the care you need, when you need it.  Your physician wants you to follow-up in: Katy Kemp, the Watchman Nurse Navigator, will call you after your CT once the Watchman Team has reviewed your imaging for an update on proceedings. Katy's direct  number is [phone] if you need assistance.   We recommend signing up for the patient portal called "MyChart".  Sign up information is provided on this After Visit Summary.  MyChart is used to connect with patients for Virtual Visits (Telemedicine).  Patients are able to view lab/test results, encounter notes, upcoming appointments, etc.  Non-urgent messages can be sent to your provider as well.   To learn more about what you can do with MyChart, go to [email or web address].    Any Other Special Instructions Will Be Listed Below (If Applicable).  Left Atrial Appendage Closure Device Implantation  Left atrial appendage (LAA) closure device implantation is a procedure to put a small device in the LAA of the heart. The LAA is a small sac in the wall of the heart's left upper chamber. Blood clots can form in the LAA in people with atrial fibrillation (AFib). The device closes the LAA to help prevent a blood clot and stroke. AFib is a type of irregular or rapid heartbeat (arrhythmia). There is an increased risk of blood clots and stroke with AFib. This procedure helps to reduce that risk. Tell a health care provider about: Any allergies you have. All medicines you are taking, including vitamins, herbs, eye drops, creams, and over-the-counter medicines. Any problems you or family members have had with anesthetic medicines. Any blood disorders you have. Any surgeries you have had. Any medical conditions you have. Whether you are pregnant or may be pregnant. What are the risks? Generally, this is a safe procedure. However, problems may occur, including: Infection. Bleeding. Allergic reactions to medicines or dyes. Damage to nearby structures or organs.   Heart attack. Stroke. Blood clots. Changes in heart rhythm. Device failure. What happens before the procedure? Staying hydrated Follow instructions from your health care provider about hydration, which may include: Up to 2 hours before  the procedure - you may continue to drink clear liquids, such as water, clear fruit juice, black coffee, and plain tea. Eating and drinking restrictions Follow instructions from your health care provider about eating and drinking, which may include: 8 hours before the procedure - stop eating heavy meals or foods, such as meat, fried foods, or fatty foods. 6 hours before the procedure - stop eating light meals or foods, such as toast or cereal. 6 hours before the procedure - stop drinking milk or drinks that contain milk. 2 hours before the procedure - stop drinking clear liquids. Medicines Ask your health care provider about: Changing or stopping your regular medicines. This is especially important if you are taking diabetes medicines or blood thinners. Taking medicines such as aspirin and ibuprofen. These medicines can thin your blood. Do not take these medicines unless your health care provider tells you to take them. Taking over-the-counter medicines, vitamins, herbs, and supplements. Tests You may have blood tests and a physical exam. You may have an electrocardiogram (ECG). This test checks your heart's electrical patterns and rhythms. General instructions Do not use any products that contain nicotine or tobacco. These include cigarettes, chewing tobacco, and vaping devices, such as e-cigarettes. If you need help quitting, ask your health care provider. Ask your health care provider what steps will be taken to help prevent infection. These steps may include: Removing hair at the surgery site. Washing skin with a germ-killing soap. Taking antibiotic medicine. Plan to have a responsible adult take you home from the hospital or clinic. Plan to have a responsible adult care for you for the time you are told after you leave the hospital or clinic. This is important. What happens during the procedure? An IV will be inserted into one of your veins. You will be given one or more of the  following: A medicine to help you relax (sedative). A medicine to make you fall asleep (general anesthetic). A small incision will be made in your groin area. A small wire will be put through the incision and into a blood vessel. Dye may be injected so X-rays can be used to guide the wire through the blood vessel. A long, thin tube (catheter) will be put over the small wire and moved up through the blood vessel to reach your heart. The closure device will be moved through the catheter until it reaches your heart. A small hole will be made in the septum (transseptal puncture). The septum is a thin tissue that separates the upper two chambers of the heart. The device will be placed so that it closes the LAA. X-rays will be done to make sure the device is in the right place. The catheter and wire will be removed. The closure device will remain in your heart. After pressure is applied over the catheter site to prevent bleeding, a bandage (dressing) will be placed over the site where the catheter was inserted. The procedure may vary among health care providers and hospitals. What happens after the procedure? Your blood pressure, heart rate, breathing rate, and blood oxygen level will be monitored until you leave the hospital or clinic. You may have to wear compression stockings. These stockings help to prevent blood clots and reduce swelling in your legs. If you were given a sedative   during the procedure, it can affect you for several hours. Do not drive or operate machinery until your health care provider says it is safe. You may be given pain medicine. You may need to drink more fluids to wash (flush) the dye out of your body. Drink enough fluid to keep your urine pale yellow. Take over-the-counter and prescription medicines only as told by your health care provider. This is especially important if you were given blood thinners. Summary Left atrial appendage (LAA) closure device implantation is a  procedure that is done to put a small device in the LAA of the heart. The LAA is a small sac in the wall of the heart's left upper chamber. The device closes the LAA to prevent stroke and other problems. Follow instructions from your health care provider before and after the procedure. This information is not intended to replace advice given to you by your health care provider. Make sure you discuss any questions you have with your health care provider. Document Revised: 02/16/2020 Document Reviewed: 02/16/2020 Elsevier Patient Education  2023 Elsevier Inc.        

## 2021-12-12 LAB — BASIC METABOLIC PANEL
BUN/Creatinine Ratio: 36 — ABNORMAL HIGH (ref 12–28)
BUN: 31 mg/dL — ABNORMAL HIGH (ref 8–27)
CO2: 22 mmol/L (ref 20–29)
Calcium: 9.4 mg/dL (ref 8.7–10.3)
Chloride: 101 mmol/L (ref 96–106)
Creatinine, Ser: 0.86 mg/dL (ref 0.57–1.00)
Glucose: 97 mg/dL (ref 70–99)
Potassium: 4.2 mmol/L (ref 3.5–5.2)
Sodium: 142 mmol/L (ref 134–144)
eGFR: 70 mL/min/{1.73_m2} (ref >59)

## 2021-12-16 ENCOUNTER — Telehealth: Payer: Medicare Other | Admitting: Family Medicine

## 2021-12-16 ENCOUNTER — Encounter: Payer: Self-pay | Admitting: Family Medicine

## 2021-12-16 DIAGNOSIS — L255 Unspecified contact dermatitis due to plants, except food: Secondary | ICD-10-CM | POA: Diagnosis not present

## 2021-12-16 MED ORDER — TRIAMCINOLONE ACETONIDE 0.1 % EX OINT: 1.0000 | TOPICAL_OINTMENT | Freq: Two times a day (BID) | CUTANEOUS | 1 refills | Status: AC

## 2021-12-16 MED ORDER — PREDNISONE 10 MG PO TABS: ORAL_TABLET | ORAL | 0 refills | Status: DC

## 2021-12-19 ENCOUNTER — Other Ambulatory Visit: Payer: Self-pay | Admitting: Podiatry

## 2021-12-19 DIAGNOSIS — M76822 Posterior tibial tendinitis, left leg: Secondary | ICD-10-CM

## 2021-12-26 ENCOUNTER — Ambulatory Visit (HOSPITAL_COMMUNITY): Payer: Medicare Other | Attending: Cardiology

## 2021-12-26 DIAGNOSIS — I10 Essential (primary) hypertension: Secondary | ICD-10-CM | POA: Insufficient documentation

## 2021-12-26 DIAGNOSIS — I480 Paroxysmal atrial fibrillation: Secondary | ICD-10-CM | POA: Diagnosis not present

## 2021-12-26 DIAGNOSIS — Z01818 Encounter for other preprocedural examination: Secondary | ICD-10-CM | POA: Diagnosis not present

## 2021-12-26 LAB — ECHOCARDIOGRAM COMPLETE
Area-P 1/2: 3.12 cm2
S' Lateral: 3 cm

## 2021-12-31 DIAGNOSIS — G894 Chronic pain syndrome: Secondary | ICD-10-CM | POA: Diagnosis not present

## 2021-12-31 DIAGNOSIS — Z9689 Presence of other specified functional implants: Secondary | ICD-10-CM | POA: Diagnosis not present

## 2022-01-01 ENCOUNTER — Other Ambulatory Visit: Payer: Self-pay | Admitting: Family Medicine

## 2022-01-01 DIAGNOSIS — M8580 Other specified disorders of bone density and structure, unspecified site: Secondary | ICD-10-CM

## 2022-01-02 ENCOUNTER — Telehealth (HOSPITAL_COMMUNITY): Payer: Self-pay | Admitting: *Deleted

## 2022-01-02 NOTE — Telephone Encounter (Signed)
Reaching out to patient to offer assistance regarding upcoming cardiac imaging study; pt verbalizes understanding of appt date/time, parking situation and where to check in, pre-test NPO status and verified current allergies; name and call back number provided for further questions should they arise  Larey Brick RN Navigator Cardiac Imaging [facility] [phone] office [phone] cell  Patient to take her AM metoprolol dose at 7:30am. She is aware to arrive at 9am.

## 2022-01-03 ENCOUNTER — Ambulatory Visit (HOSPITAL_COMMUNITY)
Admission: RE | Admit: 2022-01-03 | Discharge: 2022-01-03 | Disposition: A | Discharge status: Home / Self Care | Payer: Medicare Other | Source: Ambulatory Visit | Attending: Cardiology | Admitting: Cardiology

## 2022-01-03 DIAGNOSIS — Z01818 Encounter for other preprocedural examination: Secondary | ICD-10-CM | POA: Diagnosis not present

## 2022-01-03 DIAGNOSIS — I480 Paroxysmal atrial fibrillation: Secondary | ICD-10-CM

## 2022-01-03 DIAGNOSIS — I10 Essential (primary) hypertension: Secondary | ICD-10-CM | POA: Diagnosis not present

## 2022-01-03 MED ORDER — IOHEXOL 350 MG/ML SOLN
100.0000 mL | Freq: Once | INTRAVENOUS | Status: AC | PRN
  Administered 2022-01-03: 100 mL via INTRAVENOUS

## 2022-01-11 ENCOUNTER — Other Ambulatory Visit: Payer: Self-pay | Admitting: Family Medicine

## 2022-01-20 DIAGNOSIS — H40033 Anatomical narrow angle, bilateral: Secondary | ICD-10-CM | POA: Diagnosis not present

## 2022-01-20 DIAGNOSIS — H04123 Dry eye syndrome of bilateral lacrimal glands: Secondary | ICD-10-CM | POA: Diagnosis not present

## 2022-01-23 ENCOUNTER — Telehealth: Payer: Self-pay

## 2022-01-23 ENCOUNTER — Other Ambulatory Visit: Payer: Self-pay

## 2022-01-23 NOTE — Telephone Encounter (Signed)
Reviewed results with patient who verbalized understanding.   The patient wishes to proceed with LAAO on 03/20/2022. Scheduled her for pre-procedure visit on 03/03/2022. She was grateful for call and agrees with plan.

## 2022-01-23 NOTE — Telephone Encounter (Signed)
-----   Message from Lanier Prude, MD sent at 01/05/2022  2:35 PM EDT ----- OK to proceed with Watchman evaluation.  Sheria Lang T. Lalla Brothers, MD, [facility], [facility] Cardiac Electrophysiology

## 2022-01-24 ENCOUNTER — Telehealth: Payer: Self-pay

## 2022-01-28 ENCOUNTER — Other Ambulatory Visit: Payer: Self-pay

## 2022-01-28 ENCOUNTER — Encounter: Payer: Self-pay | Admitting: Cardiology

## 2022-01-28 DIAGNOSIS — E039 Hypothyroidism, unspecified: Secondary | ICD-10-CM | POA: Diagnosis not present

## 2022-01-28 MED ORDER — APIXABAN 5 MG PO TABS: 5.0000 mg | ORAL_TABLET | Freq: Two times a day (BID) | ORAL | 1 refills | Status: DC

## 2022-01-28 NOTE — Telephone Encounter (Signed)
Prescription refill request for Eliquis received. Indication:Afib Last office visit:6/23 Scr:0.8 Age: 76 Weight:69.4 kg  Prescription refilled

## 2022-02-01 ENCOUNTER — Ambulatory Visit
Admission: EM | Admit: 2022-02-01 | Discharge: 2022-02-01 | Disposition: A | Discharge status: Home / Self Care | Payer: Medicare Other | Attending: Physician Assistant | Admitting: Physician Assistant

## 2022-02-01 DIAGNOSIS — T7840XA Allergy, unspecified, initial encounter: Secondary | ICD-10-CM

## 2022-02-01 DIAGNOSIS — L282 Other prurigo: Secondary | ICD-10-CM | POA: Diagnosis not present

## 2022-02-01 MED ORDER — PREDNISONE 10 MG (21) PO TBPK: ORAL_TABLET | ORAL | 0 refills | Status: DC

## 2022-02-01 MED ORDER — FAMOTIDINE 20 MG PO TABS: 20.0000 mg | ORAL_TABLET | Freq: Every day | ORAL | 0 refills | Status: DC
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
## 2022-02-01 NOTE — Discharge Instructions (Signed)
I am concerned that you are having an allergic reaction.  Continue taking your Claritin in the morning.  Take famotidine at night.  Start prednisone taper.  Do not take NSAIDs including aspirin, ibuprofen/Advil, naproxen/Aleve stable because stomach bleeding.  You can use Tylenol as needed.  Make sure that you are using hypoallergenic soaps and detergents and avoid new exposures.  Follow-up with your primary care next week.  If you have any worsening symptoms including shortness of breath, fever, nausea, vomiting, swelling of your throat, shortness of breath, difficulty swallowing, difficulty speaking you need to go to the emergency room.

## 2022-02-01 NOTE — ED Triage Notes (Signed)
Pt c/o rash to face and chest that was not there yesterday but awoke during the night with pruritic sensation. Not relieved with triamcinolone.

## 2022-02-11 DIAGNOSIS — E039 Hypothyroidism, unspecified: Secondary | ICD-10-CM | POA: Diagnosis not present

## 2022-02-12 ENCOUNTER — Ambulatory Visit (INDEPENDENT_AMBULATORY_CARE_PROVIDER_SITE_OTHER): Payer: Medicare Other

## 2022-02-12 ENCOUNTER — Encounter: Payer: Self-pay | Admitting: Orthopedic Surgery

## 2022-02-12 ENCOUNTER — Ambulatory Visit: Payer: Medicare Other | Admitting: Orthopedic Surgery

## 2022-02-12 DIAGNOSIS — M79604 Pain in right leg: Secondary | ICD-10-CM | POA: Diagnosis not present
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
## 2022-02-19 ENCOUNTER — Other Ambulatory Visit (HOSPITAL_COMMUNITY): Payer: Self-pay | Admitting: Orthopedic Surgery

## 2022-02-19 DIAGNOSIS — M5416 Radiculopathy, lumbar region: Secondary | ICD-10-CM
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
## 2022-03-03 ENCOUNTER — Ambulatory Visit: Payer: Medicare Other | Attending: Cardiology | Admitting: Cardiology

## 2022-03-03 VITALS — BP 120/70 | HR 58 | Ht 63.0 in | Wt 146.0 lb

## 2022-03-03 DIAGNOSIS — I480 Paroxysmal atrial fibrillation: Secondary | ICD-10-CM | POA: Diagnosis not present

## 2022-03-03 DIAGNOSIS — Z01810 Encounter for preprocedural cardiovascular examination: Secondary | ICD-10-CM

## 2022-03-03 DIAGNOSIS — I10 Essential (primary) hypertension: Secondary | ICD-10-CM | POA: Diagnosis not present

## 2022-03-03 NOTE — Patient Instructions (Signed)
Medication Instructions:  Your physician recommends that you continue on your current medications as directed. Please refer to the Current Medication list given to you today.  *If you need a refill on your cardiac medications before your next appointment, please call your pharmacy*   Lab Work: TODAY: BMET, CBC If you have labs (blood work) drawn today and your tests are completely normal, you will receive your results only by: MyChart Message (if you have MyChart) OR A paper copy in the mail If you have any lab test that is abnormal or we need to change your treatment, we will call you to review the results.   Testing/Procedures: SEE INSTRUCTION LETTER   Follow-Up: At [facility], you and your health needs are our priority.  As part of our continuing mission to provide you with exceptional heart care, we have created designated Provider Care Teams.  These Care Teams include your primary Cardiologist (physician) and Advanced Practice Providers (APPs -  Physician Assistants and Nurse Practitioners) who all work together to provide you with the care you need, when you need it.  We recommend signing up for the patient portal called "MyChart".  Sign up information is provided on this After Visit Summary.  MyChart is used to connect with patients for Virtual Visits (Telemedicine).  Patients are able to view lab/test results, encounter notes, upcoming appointments, etc.  Non-urgent messages can be sent to your provider as well.   To learn more about what you can do with MyChart, go to [email or web address].    Your next appointment:   WHEN YOU GET DISCHARGED, YOU WILL GET DATES AND TIMES OF YOUR FOLLOW-UP APPOINTMENTS  Important Information About Sugar

## 2022-03-04 ENCOUNTER — Other Ambulatory Visit: Payer: Self-pay

## 2022-03-04 DIAGNOSIS — I480 Paroxysmal atrial fibrillation: Secondary | ICD-10-CM

## 2022-03-04 LAB — CBC
Hematocrit: 37.3 % (ref 34.0–46.6)
Hemoglobin: 13.2 g/dL (ref 11.1–15.9)
MCH: 31.9 pg (ref 26.6–33.0)
MCHC: 35.4 g/dL (ref 31.5–35.7)
MCV: 90 fL (ref 79–97)
Platelets: 183 10*3/uL (ref 150–450)
RBC: 4.14 x10E6/uL (ref 3.77–5.28)
RDW: 12.7 % (ref 11.7–15.4)
WBC: 8.2 10*3/uL (ref 3.4–10.8)

## 2022-03-04 LAB — BASIC METABOLIC PANEL
BUN/Creatinine Ratio: 25 (ref 12–28)
BUN: 18 mg/dL (ref 8–27)
CO2: 26 mmol/L (ref 20–29)
Calcium: 9.2 mg/dL (ref 8.7–10.3)
Chloride: 103 mmol/L (ref 96–106)
Creatinine, Ser: 0.73 mg/dL (ref 0.57–1.00)
Glucose: 84 mg/dL (ref 70–99)
Potassium: 4.7 mmol/L (ref 3.5–5.2)
Sodium: 140 mmol/L (ref 134–144)
eGFR: 85 mL/min/{1.73_m2} (ref >59)

## 2022-03-08 ENCOUNTER — Encounter (HOSPITAL_COMMUNITY): Payer: Self-pay | Admitting: *Deleted

## 2022-03-08 ENCOUNTER — Other Ambulatory Visit: Payer: Self-pay

## 2022-03-08 NOTE — Progress Notes (Signed)
SDW CALL  Patient was given pre-op instructions over the phone. The opportunity was given for the patient to ask questions.  Patient verbalized understanding of instructions given.   PCP - Dr. Birdie Riddle Cardiologist - Dr. Gardiner Rhyme EP: Dr. Quentin Ore  Spinal Cord Stimulator--instructed to bring remote  Chest x-ray - 11-01-20 EKG - 03-03-22 Stress Test -  ECHO - 12-26-21 Cardiac Cath - denies   Blood Thinner Instructions: On Eliquis, no changes for MRI Aspirin Instructions:  ERAS Protcol - clears until 7:15  COVID TEST- n/a  Anesthesia review: Yes  Patient denies shortness of breath, fever, cough and chest pain over the phone call
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
## 2022-03-10 NOTE — Anesthesia Preprocedure Evaluation (Signed)
Anesthesia Evaluation  Patient identified by MRN, date of birth, ID band Patient awake    Reviewed: Allergy & Precautions, NPO status , Patient's Chart, lab work & pertinent test results, reviewed documented beta blocker date and time   History of Anesthesia Complications (+) PONV and history of anesthetic complications  Airway Mallampati: III  TM Distance: >3 FB Neck ROM: Full    Dental  (+) Dental Advisory Given, Teeth Intact   Pulmonary pneumonia,    Pulmonary exam normal breath sounds clear to auscultation       Cardiovascular hypertension, Pt. on medications and Pt. on home beta blockers Normal cardiovascular exam+ dysrhythmias Atrial Fibrillation  Rhythm:Regular Rate:Normal  CT Cardiac 01/03/22: 1. Moderate bi atrial enlargement with no ASD/PFO 2. Large windsock appendage with no thrombus Landing zone 24.2 mm suitable for a 31 mm FLX device 3. Normal ascending thoracic aorta 3.0 cm 4. No pericardial effusion 5. Calcium score 0.7 which is only 26th percentile for age/sex Isolated to mid LAD 6. Working angle RAO 16 Cranial 7 degrees   Echo 12/26/21: 1. Left ventricular ejection fraction, by estimation, is 55 to 60%. The left ventricle has normal function. The left ventricle has no regional wall motion abnormalities. Left ventricular diastolic parameters are indeterminate.  2. Right ventricular systolic function is normal. The right ventricular size is normal. There is normal pulmonary artery systolic pressure.  3. Left atrial size was mild to moderately dilated.  4. Right atrial size was mildly dilated.  5. The mitral valve is normal in structure. Mild mitral valve  regurgitation. No evidence of mitral stenosis.  6. The aortic valve is tricuspid. Aortic valve regurgitation is not visualized. No aortic stenosis is present.  7. The inferior vena cava is normal in size with greater than 50% respiratory variability,  suggesting right atrial pressure of 3 mmHg.  - Comparison(s): No significant change from prior study.  - Conclusion(s)/Recommendation(s): Otherwise normal echocardiogram, with minor abnormalities described in the report.    Neuro/Psych PSYCHIATRIC DISORDERS Anxiety negative neurological ROS     GI/Hepatic Neg liver ROS, GERD  ,  Endo/Other  Hypothyroidism   Renal/GU negative Renal ROS     Musculoskeletal  (+) Arthritis ,   Abdominal   Peds  Hematology  (+) Blood dyscrasia, anemia ,   Anesthesia Other Findings   Reproductive/Obstetrics                                                          Anesthesia Evaluation  Patient identified by MRN, date of birth, ID band Patient awake    Reviewed: Allergy & Precautions, NPO status , Patient's Chart, lab work & pertinent test results, reviewed documented beta blocker date and time   History of Anesthesia Complications (+) PONV and history of anesthetic complications  Airway Mallampati: II  TM Distance: >3 FB Neck ROM: Full    Dental  (+) Teeth Intact, Dental Advisory Given,    Pulmonary asthma ,    breath sounds clear to auscultation       Cardiovascular hypertension, Pt. on home beta blockers and Pt. on medications + dysrhythmias Atrial Fibrillation  Rhythm:Regular Rate:Normal     Neuro/Psych PSYCHIATRIC DISORDERS (claustrophobia) Anxiety  Neuromuscular disease    GI/Hepatic Neg liver ROS, GERD  Controlled and Medicated,  Endo/Other  Hypothyroidism  Renal/GU negative Renal ROS  negative genitourinary   Musculoskeletal  (+) Arthritis , Osteoarthritis,    Abdominal Normal abdominal exam  (+)   Peds  Hematology  (+) Blood dyscrasia, anemia ,   Anesthesia Other Findings   Reproductive/Obstetrics negative OB ROS                            Anesthesia Physical  Anesthesia Plan  ASA: 3  Anesthesia Plan: General   Post-op Pain Management:     Induction: Intravenous  PONV Risk Score and Plan: 4 or greater and Ondansetron, Dexamethasone, Treatment may vary due to age or medical condition and Midazolam  Airway Management Planned: LMA  Additional Equipment: None  Intra-op Plan:   Post-operative Plan: Extubation in OR  Informed Consent: I have reviewed the patients History and Physical, chart, labs and discussed the procedure including the risks, benefits and alternatives for the proposed anesthesia with the patient or authorized representative who has indicated his/her understanding and acceptance.     Dental advisory given  Plan Discussed with: CRNA  Anesthesia Plan Comments:        Anesthesia Quick Evaluation  Anesthesia Physical Anesthesia Plan  ASA: 3  Anesthesia Plan: General   Post-op Pain Management: Minimal or no pain anticipated   Induction: Intravenous  PONV Risk Score and Plan: 4 or greater and Ondansetron, Treatment may vary due to age or medical condition and Dexamethasone  Airway Management Planned: Oral ETT  Additional Equipment:   Intra-op Plan:   Post-operative Plan: Extubation in OR  Informed Consent: I have reviewed the patients History and Physical, chart, labs and discussed the procedure including the risks, benefits and alternatives for the proposed anesthesia with the patient or authorized representative who has indicated his/her understanding and acceptance.     Dental advisory given  Plan Discussed with: CRNA  Anesthesia Plan Comments: (PAT note written 03/10/2022 by Shonna Chock, PA-C. )     Anesthesia Quick Evaluation

## 2022-03-11 ENCOUNTER — Encounter (HOSPITAL_COMMUNITY): Payer: Self-pay | Admitting: Orthopedic Surgery

## 2022-03-11 ENCOUNTER — Ambulatory Visit (HOSPITAL_BASED_OUTPATIENT_CLINIC_OR_DEPARTMENT_OTHER): Payer: Medicare Other | Admitting: Vascular Surgery

## 2022-03-11 ENCOUNTER — Encounter (HOSPITAL_COMMUNITY)
Admission: RE | Disposition: A | Discharge status: Home / Self Care | Payer: Self-pay | Source: Home / Self Care | Attending: Cardiology

## 2022-03-11 ENCOUNTER — Other Ambulatory Visit: Payer: Self-pay

## 2022-03-11 ENCOUNTER — Ambulatory Visit (HOSPITAL_COMMUNITY)
Admission: RE | Admit: 2022-03-11 | Discharge: 2022-03-11 | Disposition: A | Discharge status: Home / Self Care | Payer: Medicare Other | Source: Ambulatory Visit | Attending: Orthopedic Surgery | Admitting: Orthopedic Surgery

## 2022-03-11 ENCOUNTER — Ambulatory Visit (HOSPITAL_COMMUNITY): Payer: Medicare Other | Admitting: Vascular Surgery

## 2022-03-11 ENCOUNTER — Ambulatory Visit (HOSPITAL_COMMUNITY)
Admission: RE | Admit: 2022-03-11 | Discharge: 2022-03-11 | Disposition: A | Discharge status: Home / Self Care | Payer: Medicare Other | Attending: Cardiology | Admitting: Cardiology

## 2022-03-11 DIAGNOSIS — I10 Essential (primary) hypertension: Secondary | ICD-10-CM

## 2022-03-11 DIAGNOSIS — M5416 Radiculopathy, lumbar region: Secondary | ICD-10-CM

## 2022-03-11 DIAGNOSIS — M5489 Other dorsalgia: Secondary | ICD-10-CM | POA: Diagnosis not present

## 2022-03-11 DIAGNOSIS — D649 Anemia, unspecified: Secondary | ICD-10-CM | POA: Diagnosis not present

## 2022-03-11 DIAGNOSIS — M79604 Pain in right leg: Secondary | ICD-10-CM

## 2022-03-11 DIAGNOSIS — G8929 Other chronic pain: Secondary | ICD-10-CM | POA: Diagnosis not present

## 2022-03-11 DIAGNOSIS — I480 Paroxysmal atrial fibrillation: Secondary | ICD-10-CM | POA: Diagnosis not present

## 2022-03-11 DIAGNOSIS — J45909 Unspecified asthma, uncomplicated: Secondary | ICD-10-CM | POA: Insufficient documentation

## 2022-03-11 DIAGNOSIS — W19XXXA Unspecified fall, initial encounter: Secondary | ICD-10-CM | POA: Insufficient documentation

## 2022-03-11 DIAGNOSIS — D638 Anemia in other chronic diseases classified elsewhere: Secondary | ICD-10-CM

## 2022-03-11 DIAGNOSIS — M25561 Pain in right knee: Secondary | ICD-10-CM | POA: Insufficient documentation

## 2022-03-11 DIAGNOSIS — Z981 Arthrodesis status: Secondary | ICD-10-CM | POA: Diagnosis not present

## 2022-03-11 DIAGNOSIS — Z96653 Presence of artificial knee joint, bilateral: Secondary | ICD-10-CM | POA: Diagnosis not present

## 2022-03-11 DIAGNOSIS — M25551 Pain in right hip: Secondary | ICD-10-CM | POA: Diagnosis not present

## 2022-03-11 DIAGNOSIS — E039 Hypothyroidism, unspecified: Secondary | ICD-10-CM

## 2022-03-11 DIAGNOSIS — M549 Dorsalgia, unspecified: Secondary | ICD-10-CM | POA: Diagnosis not present

## 2022-03-11 DIAGNOSIS — Z7901 Long term (current) use of anticoagulants: Secondary | ICD-10-CM | POA: Insufficient documentation

## 2022-03-11 DIAGNOSIS — K219 Gastro-esophageal reflux disease without esophagitis: Secondary | ICD-10-CM | POA: Insufficient documentation

## 2022-03-11 DIAGNOSIS — M5126 Other intervertebral disc displacement, lumbar region: Secondary | ICD-10-CM | POA: Diagnosis not present

## 2022-03-11 DIAGNOSIS — I4891 Unspecified atrial fibrillation: Secondary | ICD-10-CM | POA: Diagnosis not present

## 2022-03-11 DIAGNOSIS — S76311A Strain of muscle, fascia and tendon of the posterior muscle group at thigh level, right thigh, initial encounter: Secondary | ICD-10-CM | POA: Diagnosis not present

## 2022-03-11 DIAGNOSIS — M5136 Other intervertebral disc degeneration, lumbar region: Secondary | ICD-10-CM | POA: Insufficient documentation

## 2022-03-11 HISTORY — PX: RADIOLOGY WITH ANESTHESIA: SHX6223

## 2022-03-11 SURGERY — MRI WITH ANESTHESIA
Anesthesia: General | Laterality: Right

## 2022-03-11 MED ORDER — LACTATED RINGERS IV SOLN: INTRAVENOUS | Status: DC

## 2022-03-11 MED ORDER — CHLORHEXIDINE GLUCONATE 0.12 % MT SOLN: 15.0000 mL | Freq: Once | OROMUCOSAL | Status: AC

## 2022-03-11 MED ORDER — SUGAMMADEX SODIUM 200 MG/2ML IV SOLN
INTRAVENOUS | Status: DC | PRN
  Administered 2022-03-11: 200 mg via INTRAVENOUS

## 2022-03-11 MED ORDER — PROPOFOL 10 MG/ML IV BOLUS
INTRAVENOUS | Status: DC | PRN
  Administered 2022-03-11: 100 mg via INTRAVENOUS

## 2022-03-11 MED ORDER — ROCURONIUM BROMIDE 10 MG/ML (PF) SYRINGE
PREFILLED_SYRINGE | INTRAVENOUS | Status: DC | PRN
  Administered 2022-03-11: 40 mg via INTRAVENOUS

## 2022-03-11 MED ORDER — PHENYLEPHRINE 80 MCG/ML (10ML) SYRINGE FOR IV PUSH (FOR BLOOD PRESSURE SUPPORT)
PREFILLED_SYRINGE | INTRAVENOUS | Status: DC | PRN
  Administered 2022-03-11: 120 ug via INTRAVENOUS

## 2022-03-11 MED ORDER — CHLORHEXIDINE GLUCONATE 0.12 % MT SOLN
OROMUCOSAL | Status: AC
  Administered 2022-03-11: 15 mL via OROMUCOSAL
  Filled 2022-03-11: qty 15

## 2022-03-11 MED ORDER — LIDOCAINE 2% (20 MG/ML) 5 ML SYRINGE
INTRAMUSCULAR | Status: DC | PRN
  Administered 2022-03-11: 50 mg via INTRAVENOUS

## 2022-03-11 MED ORDER — ESMOLOL HCL 100 MG/10ML IV SOLN
INTRAVENOUS | Status: DC | PRN
  Administered 2022-03-11: 30 mg via INTRAVENOUS

## 2022-03-11 MED ORDER — EPHEDRINE SULFATE-NACL 50-0.9 MG/10ML-% IV SOSY
PREFILLED_SYRINGE | INTRAVENOUS | Status: DC | PRN
  Administered 2022-03-11: 7.5 mg via INTRAVENOUS

## 2022-03-11 MED ORDER — ORAL CARE MOUTH RINSE: 15.0000 mL | Freq: Once | OROMUCOSAL | Status: AC

## 2022-03-11 NOTE — Transfer of Care (Signed)
Immediate Anesthesia Transfer of Care Note  Patient: Melissa Jordan  Procedure(s) Performed: MRI WITH ANESTHESIA OF RIGHT HIP WITHOUT CONTRAST,LUMBER SPINE WITH AND WITHOUT CONTRAST (Right)  Patient Location: PACU  Anesthesia Type:General  Level of Consciousness: drowsy  Airway & Oxygen Therapy: Patient Spontanous Breathing  Post-op Assessment: Report given to RN and Post -op Vital signs reviewed and stable  Post vital signs: Reviewed  Last Vitals:  Vitals Value Taken Time  BP 124/76 03/11/22 1255  Temp 36.1 C 03/11/22 1255  Pulse 80 03/11/22 1258  Resp 13 03/11/22 1258  SpO2 95 % 03/11/22 1258  Vitals shown include unvalidated device data.  Last Pain:  Vitals:   03/11/22 1255  TempSrc:   PainSc: 0-No pain         Complications: No notable events documented.

## 2022-03-11 NOTE — Anesthesia Procedure Notes (Signed)
Procedure Name: Intubation Date/Time: 03/11/2022 11:30 AM  Performed by: Georgia Duff, CRNAPre-anesthesia Checklist: Patient identified, Emergency Drugs available, Suction available and Patient being monitored Patient Re-evaluated:Patient Re-evaluated prior to induction Oxygen Delivery Method: Circle System Utilized Preoxygenation: Pre-oxygenation with 100% oxygen Induction Type: IV induction Ventilation: Mask ventilation without difficulty Laryngoscope Size: Mac and 3 Grade View: Grade II Tube type: Oral Tube size: 7.0 mm Number of attempts: 1 Airway Equipment and Method: Stylet and Oral airway Placement Confirmation: ETT inserted through vocal cords under direct vision, positive ETCO2 and breath sounds checked- equal and bilateral Secured at: 20 cm Tube secured with: Tape Dental Injury: Teeth and Oropharynx as per pre-operative assessment

## 2022-03-12 ENCOUNTER — Encounter (HOSPITAL_COMMUNITY): Payer: Self-pay | Admitting: Radiology

## 2022-03-12 NOTE — Anesthesia Postprocedure Evaluation (Signed)
Anesthesia Post Note  Patient: Melissa Jordan  Procedure(s) Performed: MRI WITH ANESTHESIA OF RIGHT HIP WITHOUT CONTRAST,LUMBER SPINE WITH AND WITHOUT CONTRAST (Right)     Patient location during evaluation: PACU Anesthesia Type: General Level of consciousness: sedated and patient cooperative Pain management: pain level controlled Vital Signs Assessment: post-procedure vital signs reviewed and stable Respiratory status: spontaneous breathing Cardiovascular status: stable Anesthetic complications: no   No notable events documented.  Last Vitals:  Vitals:   03/11/22 1300 03/11/22 1315  BP: 113/81 122/72  Pulse: 83 77  Resp: 14 12  Temp:    SpO2: 97% 96%    Last Pain:  Vitals:   03/11/22 1315  TempSrc:   PainSc: 0-No pain                 Nolon Nations

## 2022-03-14 ENCOUNTER — Ambulatory Visit: Payer: Medicare Other | Admitting: Orthopedic Surgery

## 2022-03-14 DIAGNOSIS — M79604 Pain in right leg: Secondary | ICD-10-CM

## 2022-03-15 ENCOUNTER — Encounter: Payer: Self-pay | Admitting: Orthopedic Surgery
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
## 2022-03-17 ENCOUNTER — Telehealth: Payer: Self-pay

## 2022-03-17 DIAGNOSIS — M25551 Pain in right hip: Secondary | ICD-10-CM | POA: Diagnosis not present

## 2022-03-17 DIAGNOSIS — M5440 Lumbago with sciatica, unspecified side: Secondary | ICD-10-CM | POA: Diagnosis not present

## 2022-03-17 DIAGNOSIS — G894 Chronic pain syndrome: Secondary | ICD-10-CM | POA: Diagnosis not present

## 2022-03-17 DIAGNOSIS — M461 Sacroiliitis, not elsewhere classified: Secondary | ICD-10-CM | POA: Diagnosis not present

## 2022-03-17 NOTE — Telephone Encounter (Signed)
The patient called confused because someone called her  from the hospital and told her to be in Admitting for the procedure at 0730.  Confirmed the patient will arrive at 0900 for procedure at 1130. Reviewed medication instructions and she understands she will stay the night for observation. She was grateful for assistance.

## 2022-03-19 ENCOUNTER — Encounter (HOSPITAL_COMMUNITY): Payer: Self-pay | Admitting: Cardiology

## 2022-03-19 NOTE — Pre-Procedure Instructions (Signed)
PCP - Mauri Pole Cardiologist - Dr. Quentin Ore  EKG - 03/03/22 Chest x-ray - DOS ECHO - 12/26/21 Cardiac Cath - n/a CPAP - n/a  Blood Thinner Instructions: Eliquis last dose 9/27 Aspirin Instructions: n/a ERAS Protcol - no COVID TEST- no  Anesthesia review: no  -------------  SDW INSTRUCTIONS:  Your procedure is scheduled on 03/20/22. Please report to [facility] Main Entrance "A" at [email or web address] and check in at the Admitting office. Call this number if you have problems the morning of surgery: [phone]   Remember: Do not eat or drink after midnight the night before your surgery   Medications to take morning of surgery with a sip of water include: Levothyroxine, metoprolol, loratadine  As of today, STOP taking any Aspirin (unless otherwise instructed by your surgeon), Aleve, Naproxen, Ibuprofen, Motrin, Advil, Goody's, BC's, all herbal medications, fish oil, and all vitamins.    The Morning of Surgery Do not wear jewelry, make-up or nail polish. Do not wear lotions, powders, or perfumes/colognes, or deodorant Do not bring valuables to the hospital. [facility] is not responsible for any belongings or valuables.  If you are a smoker, DO NOT Smoke 24 hours prior to surgery  If you wear a CPAP at night please bring your mask the morning of surgery   Remember that you must have someone to transport you home after your surgery, and remain with you for 24 hours if you are discharged the same day.  Please bring cases for contacts, glasses, hearing aids, dentures or bridgework because it cannot be worn into surgery.   Patients discharged the day of surgery will not be allowed to drive home.   Please shower the NIGHT BEFORE/MORNING OF SURGERY (use antibacterial soap like DIAL soap if possible). Wear comfortable clothes the morning of surgery. Oral Hygiene is also important to reduce your risk of infection.  Remember - BRUSH YOUR TEETH THE MORNING OF SURGERY WITH YOUR REGULAR  TOOTHPASTE  Patient denies shortness of breath, fever, cough and chest pain.

## 2022-03-20 ENCOUNTER — Other Ambulatory Visit: Payer: Self-pay | Admitting: Cardiology

## 2022-03-20 ENCOUNTER — Inpatient Hospital Stay (HOSPITAL_COMMUNITY): Payer: Medicare Other | Admitting: Anesthesiology

## 2022-03-20 ENCOUNTER — Encounter (HOSPITAL_COMMUNITY): Payer: Self-pay | Admitting: Cardiology

## 2022-03-20 ENCOUNTER — Inpatient Hospital Stay (HOSPITAL_COMMUNITY): Payer: Medicare Other

## 2022-03-20 ENCOUNTER — Inpatient Hospital Stay (HOSPITAL_COMMUNITY)
Admission: RE | Disposition: A | Discharge status: Home / Self Care | Payer: Medicare Other | Source: Home / Self Care | Attending: Cardiology

## 2022-03-20 ENCOUNTER — Other Ambulatory Visit: Payer: Self-pay

## 2022-03-20 ENCOUNTER — Encounter: Payer: Self-pay | Admitting: Cardiology

## 2022-03-20 ENCOUNTER — Inpatient Hospital Stay (HOSPITAL_COMMUNITY)
Admission: RE | Admit: 2022-03-20 | Discharge: 2022-03-21 | DRG: 274 | Disposition: A | Discharge status: Home / Self Care | Payer: Medicare Other | Attending: Cardiology | Admitting: Cardiology

## 2022-03-20 DIAGNOSIS — E119 Type 2 diabetes mellitus without complications: Secondary | ICD-10-CM | POA: Diagnosis present

## 2022-03-20 DIAGNOSIS — J302 Other seasonal allergic rhinitis: Secondary | ICD-10-CM | POA: Diagnosis present

## 2022-03-20 DIAGNOSIS — Z881 Allergy status to other antibiotic agents status: Secondary | ICD-10-CM

## 2022-03-20 DIAGNOSIS — Z91048 Other nonmedicinal substance allergy status: Secondary | ICD-10-CM | POA: Diagnosis not present

## 2022-03-20 DIAGNOSIS — Z8673 Personal history of transient ischemic attack (TIA), and cerebral infarction without residual deficits: Secondary | ICD-10-CM

## 2022-03-20 DIAGNOSIS — J40 Bronchitis, not specified as acute or chronic: Secondary | ICD-10-CM | POA: Diagnosis not present

## 2022-03-20 DIAGNOSIS — Z95818 Presence of other cardiac implants and grafts: Principal | ICD-10-CM

## 2022-03-20 DIAGNOSIS — I10 Essential (primary) hypertension: Secondary | ICD-10-CM | POA: Diagnosis present

## 2022-03-20 DIAGNOSIS — K219 Gastro-esophageal reflux disease without esophagitis: Secondary | ICD-10-CM | POA: Diagnosis not present

## 2022-03-20 DIAGNOSIS — Z882 Allergy status to sulfonamides status: Secondary | ICD-10-CM

## 2022-03-20 DIAGNOSIS — Z9049 Acquired absence of other specified parts of digestive tract: Secondary | ICD-10-CM

## 2022-03-20 DIAGNOSIS — E039 Hypothyroidism, unspecified: Secondary | ICD-10-CM | POA: Diagnosis not present

## 2022-03-20 DIAGNOSIS — Z8249 Family history of ischemic heart disease and other diseases of the circulatory system: Secondary | ICD-10-CM | POA: Diagnosis not present

## 2022-03-20 DIAGNOSIS — Z006 Encounter for examination for normal comparison and control in clinical research program: Secondary | ICD-10-CM

## 2022-03-20 DIAGNOSIS — Z79899 Other long term (current) drug therapy: Secondary | ICD-10-CM | POA: Diagnosis not present

## 2022-03-20 DIAGNOSIS — Z885 Allergy status to narcotic agent status: Secondary | ICD-10-CM

## 2022-03-20 DIAGNOSIS — I081 Rheumatic disorders of both mitral and tricuspid valves: Secondary | ICD-10-CM | POA: Diagnosis not present

## 2022-03-20 DIAGNOSIS — I480 Paroxysmal atrial fibrillation: Principal | ICD-10-CM | POA: Diagnosis present

## 2022-03-20 DIAGNOSIS — D649 Anemia, unspecified: Secondary | ICD-10-CM | POA: Diagnosis not present

## 2022-03-20 DIAGNOSIS — I4891 Unspecified atrial fibrillation: Secondary | ICD-10-CM | POA: Diagnosis present

## 2022-03-20 DIAGNOSIS — G2581 Restless legs syndrome: Secondary | ICD-10-CM | POA: Diagnosis present

## 2022-03-20 DIAGNOSIS — Z90710 Acquired absence of both cervix and uterus: Secondary | ICD-10-CM | POA: Diagnosis not present

## 2022-03-20 DIAGNOSIS — J189 Pneumonia, unspecified organism: Secondary | ICD-10-CM | POA: Diagnosis present

## 2022-03-20 DIAGNOSIS — Z981 Arthrodesis status: Secondary | ICD-10-CM

## 2022-03-20 DIAGNOSIS — M1990 Unspecified osteoarthritis, unspecified site: Secondary | ICD-10-CM | POA: Diagnosis present

## 2022-03-20 DIAGNOSIS — Z7901 Long term (current) use of anticoagulants: Secondary | ICD-10-CM

## 2022-03-20 DIAGNOSIS — Z833 Family history of diabetes mellitus: Secondary | ICD-10-CM | POA: Diagnosis not present

## 2022-03-20 DIAGNOSIS — Z96652 Presence of left artificial knee joint: Secondary | ICD-10-CM | POA: Diagnosis not present

## 2022-03-20 DIAGNOSIS — Z79890 Hormone replacement therapy (postmenopausal): Secondary | ICD-10-CM | POA: Diagnosis not present

## 2022-03-20 DIAGNOSIS — I110 Hypertensive heart disease with heart failure: Secondary | ICD-10-CM | POA: Diagnosis not present

## 2022-03-20 DIAGNOSIS — E785 Hyperlipidemia, unspecified: Secondary | ICD-10-CM | POA: Diagnosis present

## 2022-03-20 DIAGNOSIS — Z825 Family history of asthma and other chronic lower respiratory diseases: Secondary | ICD-10-CM

## 2022-03-20 DIAGNOSIS — Z880 Allergy status to penicillin: Secondary | ICD-10-CM | POA: Diagnosis not present

## 2022-03-20 DIAGNOSIS — I4819 Other persistent atrial fibrillation: Secondary | ICD-10-CM

## 2022-03-20 HISTORY — PX: TEE WITHOUT CARDIOVERSION: SHX5443

## 2022-03-20 HISTORY — DX: Presence of other cardiac implants and grafts: Z95.818

## 2022-03-20 HISTORY — PX: LEFT ATRIAL APPENDAGE OCCLUSION: EP1229

## 2022-03-20 LAB — TYPE AND SCREEN
ABO/RH(D): O POS
Antibody Screen: NEGATIVE

## 2022-03-20 LAB — SURGICAL PCR SCREEN
MRSA, PCR: NEGATIVE
Staphylococcus aureus: NEGATIVE

## 2022-03-20 LAB — POCT ACTIVATED CLOTTING TIME: Activated Clotting Time: 323 seconds

## 2022-03-20 SURGERY — LEFT ATRIAL APPENDAGE OCCLUSION
Anesthesia: General

## 2022-03-20 MED ORDER — LIDOCAINE 2% (20 MG/ML) 5 ML SYRINGE
INTRAMUSCULAR | Status: DC | PRN
  Administered 2022-03-20: 40 mg via INTRAVENOUS

## 2022-03-20 MED ORDER — PHENYLEPHRINE 80 MCG/ML (10ML) SYRINGE FOR IV PUSH (FOR BLOOD PRESSURE SUPPORT)
PREFILLED_SYRINGE | INTRAVENOUS | Status: DC | PRN
  Administered 2022-03-20 (×2): 80 ug via INTRAVENOUS

## 2022-03-20 MED ORDER — HEPARIN SODIUM (PORCINE) 1000 UNIT/ML IJ SOLN
INTRAMUSCULAR | Status: AC
  Filled 2022-03-20: qty 1

## 2022-03-20 MED ORDER — HEPARIN (PORCINE) IN NACL 1000-0.9 UT/500ML-% IV SOLN
INTRAVENOUS | Status: AC
  Filled 2022-03-20: qty 500

## 2022-03-20 MED ORDER — ROPINIROLE HCL 1 MG PO TABS
3.0000 mg | ORAL_TABLET | Freq: Every day | ORAL | Status: DC
  Administered 2022-03-20: 3 mg via ORAL
  Filled 2022-03-20 (×2): qty 3

## 2022-03-20 MED ORDER — SODIUM CHLORIDE 0.9% FLUSH
3.0000 mL | Freq: Two times a day (BID) | INTRAVENOUS | Status: DC
  Administered 2022-03-20 – 2022-03-21 (×2): 3 mL via INTRAVENOUS

## 2022-03-20 MED ORDER — ESTRADIOL 1 MG PO TABS
1.0000 mg | ORAL_TABLET | Freq: Every day | ORAL | Status: DC
  Administered 2022-03-20 – 2022-03-21 (×2): 1 mg via ORAL
  Filled 2022-03-20 (×2): qty 1

## 2022-03-20 MED ORDER — LORATADINE 10 MG PO TABS
10.0000 mg | ORAL_TABLET | Freq: Every morning | ORAL | Status: DC
  Administered 2022-03-21: 10 mg via ORAL
  Filled 2022-03-20: qty 1

## 2022-03-20 MED ORDER — LOSARTAN POTASSIUM 50 MG PO TABS: 50.0000 mg | ORAL_TABLET | Freq: Every day | ORAL | Status: DC

## 2022-03-20 MED ORDER — IOHEXOL 350 MG/ML SOLN
INTRAVENOUS | Status: DC | PRN
  Administered 2022-03-20: 30 mL

## 2022-03-20 MED ORDER — SODIUM CHLORIDE 0.9 % IV SOLN: INTRAVENOUS | Status: DC

## 2022-03-20 MED ORDER — PHENYLEPHRINE HCL-NACL 20-0.9 MG/250ML-% IV SOLN
INTRAVENOUS | Status: DC | PRN
  Administered 2022-03-20: 20 ug/min via INTRAVENOUS

## 2022-03-20 MED ORDER — FENTANYL CITRATE (PF) 100 MCG/2ML IJ SOLN
25.0000 ug | INTRAMUSCULAR | Status: DC | PRN
  Administered 2022-03-20: 50 ug via INTRAVENOUS

## 2022-03-20 MED ORDER — HEPARIN (PORCINE) IN NACL 2000-0.9 UNIT/L-% IV SOLN
INTRAVENOUS | Status: AC
  Filled 2022-03-20: qty 1000

## 2022-03-20 MED ORDER — ONDANSETRON HCL 4 MG/2ML IJ SOLN
INTRAMUSCULAR | Status: DC | PRN
  Administered 2022-03-20: 4 mg via INTRAVENOUS

## 2022-03-20 MED ORDER — TRAMADOL HCL 50 MG PO TABS
50.0000 mg | ORAL_TABLET | Freq: Four times a day (QID) | ORAL | Status: DC | PRN
  Administered 2022-03-20 – 2022-03-21 (×3): 50 mg via ORAL
  Filled 2022-03-20 (×3): qty 1

## 2022-03-20 MED ORDER — ONDANSETRON HCL 4 MG/2ML IJ SOLN: 4.0000 mg | Freq: Four times a day (QID) | INTRAMUSCULAR | Status: DC | PRN

## 2022-03-20 MED ORDER — SODIUM CHLORIDE 0.9 % IV SOLN: 250.0000 mL | INTRAVENOUS | Status: DC | PRN

## 2022-03-20 MED ORDER — FENTANYL CITRATE (PF) 100 MCG/2ML IJ SOLN
INTRAMUSCULAR | Status: DC | PRN
  Administered 2022-03-20: 100 ug via INTRAVENOUS

## 2022-03-20 MED ORDER — LEVOTHYROXINE SODIUM 25 MCG PO TABS
25.0000 ug | ORAL_TABLET | Freq: Every day | ORAL | Status: DC
  Administered 2022-03-21: 25 ug via ORAL
  Filled 2022-03-20: qty 1

## 2022-03-20 MED ORDER — MIDAZOLAM HCL 5 MG/5ML IJ SOLN
INTRAMUSCULAR | Status: DC | PRN
  Administered 2022-03-20: 2 mg via INTRAVENOUS

## 2022-03-20 MED ORDER — DEXAMETHASONE SODIUM PHOSPHATE 10 MG/ML IJ SOLN
INTRAMUSCULAR | Status: DC | PRN
  Administered 2022-03-20: 4 mg via INTRAVENOUS

## 2022-03-20 MED ORDER — METOPROLOL TARTRATE 25 MG PO TABS
25.0000 mg | ORAL_TABLET | Freq: Two times a day (BID) | ORAL | Status: DC
  Administered 2022-03-20: 25 mg via ORAL
  Filled 2022-03-20 (×2): qty 1

## 2022-03-20 MED ORDER — HEPARIN SODIUM (PORCINE) 1000 UNIT/ML IJ SOLN
INTRAMUSCULAR | Status: DC | PRN
  Administered 2022-03-20: 9000 [IU] via INTRAVENOUS

## 2022-03-20 MED ORDER — HEPARIN (PORCINE) IN NACL 2000-0.9 UNIT/L-% IV SOLN
INTRAVENOUS | Status: DC | PRN
  Administered 2022-03-20: 1000 mL

## 2022-03-20 MED ORDER — CEFAZOLIN SODIUM-DEXTROSE 2-4 GM/100ML-% IV SOLN
2.0000 g | INTRAVENOUS | Status: AC
  Administered 2022-03-20: 2 g via INTRAVENOUS
  Filled 2022-03-20: qty 100

## 2022-03-20 MED ORDER — HEPARIN (PORCINE) IN NACL 1000-0.9 UT/500ML-% IV SOLN
INTRAVENOUS | Status: DC | PRN
  Administered 2022-03-20: 500 mL

## 2022-03-20 MED ORDER — LOSARTAN POTASSIUM 50 MG PO TABS
50.0000 mg | ORAL_TABLET | Freq: Every day | ORAL | Status: DC
  Filled 2022-03-20: qty 1

## 2022-03-20 MED ORDER — SODIUM CHLORIDE 0.9% FLUSH: 3.0000 mL | INTRAVENOUS | Status: DC | PRN

## 2022-03-20 MED ORDER — CHLORHEXIDINE GLUCONATE 0.12 % MT SOLN
OROMUCOSAL | Status: AC
  Administered 2022-03-20: 15 mL via OROMUCOSAL
  Filled 2022-03-20: qty 15

## 2022-03-20 MED ORDER — APIXABAN 5 MG PO TABS
5.0000 mg | ORAL_TABLET | Freq: Two times a day (BID) | ORAL | Status: DC
  Administered 2022-03-20 – 2022-03-21 (×2): 5 mg via ORAL
  Filled 2022-03-20 (×2): qty 1

## 2022-03-20 MED ORDER — FENTANYL CITRATE (PF) 100 MCG/2ML IJ SOLN
INTRAMUSCULAR | Status: AC
  Filled 2022-03-20: qty 2

## 2022-03-20 MED ORDER — PROTAMINE SULFATE 10 MG/ML IV SOLN
INTRAVENOUS | Status: DC | PRN
  Administered 2022-03-20: 30 mg via INTRAVENOUS

## 2022-03-20 MED ORDER — ROCURONIUM BROMIDE 10 MG/ML (PF) SYRINGE
PREFILLED_SYRINGE | INTRAVENOUS | Status: DC | PRN
  Administered 2022-03-20: 50 mg via INTRAVENOUS

## 2022-03-20 MED ORDER — CHLORHEXIDINE GLUCONATE 0.12 % MT SOLN
15.0000 mL | Freq: Once | OROMUCOSAL | Status: AC
  Filled 2022-03-20: qty 15

## 2022-03-20 MED ORDER — EPHEDRINE SULFATE-NACL 50-0.9 MG/10ML-% IV SOSY
PREFILLED_SYRINGE | INTRAVENOUS | Status: DC | PRN
  Administered 2022-03-20: 2.5 mg via INTRAVENOUS

## 2022-03-20 MED ORDER — PROPOFOL 10 MG/ML IV BOLUS
INTRAVENOUS | Status: DC | PRN
  Administered 2022-03-20: 150 mg via INTRAVENOUS
  Administered 2022-03-20: 50 mg via INTRAVENOUS

## 2022-03-20 MED ORDER — SUGAMMADEX SODIUM 200 MG/2ML IV SOLN
INTRAVENOUS | Status: DC | PRN
  Administered 2022-03-20: 200 mg via INTRAVENOUS

## 2022-03-20 SURGICAL SUPPLY — 18 items
CATH INFINITI 5FR ANG PIGTAIL (CATHETERS) IMPLANT
CLOSURE PERCLOSE PROSTYLE (VASCULAR PRODUCTS) IMPLANT
DEVICE WATCHMAN FLX PROC (KITS) IMPLANT
DILATOR VESSEL 38 20CM 11FR (INTRODUCER) IMPLANT
KIT HEART LEFT (KITS) ×1 IMPLANT
KIT SHEA VERSACROSS LAAC CONNE (KITS) IMPLANT
PACK CARDIAC CATHETERIZATION (CUSTOM PROCEDURE TRAY) ×1 IMPLANT
PAD DEFIB RADIO PHYSIO CONN (PAD) ×1 IMPLANT
SHEATH PERFORMER 16FR 30 (SHEATH) IMPLANT
SHEATH PINNACLE 8F 10CM (SHEATH) IMPLANT
SHEATH PROBE COVER 6X72 (BAG) ×1 IMPLANT
SYS WATCHMAN FXD DBL (SHEATH) ×1
SYSTEM WATCHMAN FXD DBL (SHEATH) IMPLANT
TRANSDUCER W/STOPCOCK (MISCELLANEOUS) ×1 IMPLANT
TUBING CIL FLEX 10 FLL-RA (TUBING) ×1 IMPLANT
WATCHMAN FLX 27 (Prosthesis & Implant Heart) IMPLANT
WATCHMAN FLX PROCEDURE DEVICE (KITS) ×1 IMPLANT
WATCHMAN PROCED TRUSEAL ACCESS (SHEATH) IMPLANT

## 2022-03-20 NOTE — Discharge Instructions (Addendum)
[facility] Procedure, Care After  Procedure MD: Dr. Benson Norway Clinical Coordinator: Lenice Llamas, RN  This sheet gives you information about how to care for yourself after your procedure. Your health care provider may also give you more specific instructions. If you have problems or questions, contact your health care provider.  What can I expect after the procedure? After the procedure, it is common to have: Bruising around your puncture site. Tenderness around your puncture site. Tiredness (fatigue).  Medication instructions It is very important to continue to take your blood thinner as directed by your doctor after the Watchman procedure. Call your procedure doctor's office with question or concerns. If you are on Coumadin (warfarin), you will have your INR checked the week after your procedure, with a goal INR of 2.0 - 3.0. Please follow your medication instructions on your discharge summary. Only take the medications listed on your discharge paperwork.   Follow up You will be seen in 1 month after your procedure in the Structural heart clinic You will have another CT 60 days after your device placement to look at the device You will follow up the MD/APP who performed your procedure 6 months after your procedure The Watchman Clinical Coordinator will check in with you from time to time, including 1 and 2 years after your procedure.   Follow these instructions at home: Puncture site care  Follow instructions from your health care provider about how to take care of your puncture site. Make sure you: If present, leave stitches (sutures), skin glue, or adhesive strips in place.  If a large square bandage is present, this may be removed 24 hours after surgery.  Check your puncture site every day for signs of infection. Check for: Redness, swelling, or pain. Fluid or blood. If your puncture site starts to bleed, lie down on your back, apply firm pressure to the area, and contact  your health care provider. Warmth. Pus or a bad smell. Driving Do not drive yourself home if you received sedation Do not drive for at least 4 days after your procedure or however long your health care provider recommends. (Do not resume driving if you have previously been instructed not to drive for other health reasons.) Do not spend greater than 1 hour at a time in a car for the first 3 days. Stop and take a break with a 5 minute walk at least every hour.  Do not drive or use heavy machinery while taking prescription pain medicine.  Activity Avoid activities that take a lot of effort, including exercise, for at least 7 days after your procedure. For the first 3 days, avoid sitting for longer than one hour at a time.  Avoid alcoholic beverages, signing paperwork, or participating in legal proceedings for 24 hours after receiving sedation Do not lift anything that is heavier than 10 lb (4.5 kg) for one week.  No sexual activity for 1 week.  Return to your normal activities as told by your health care provider. Ask your health care provider what activities are safe for you. General instructions Take over-the-counter and prescription medicines only as told by your health care provider. Do not use any products that contain nicotine or tobacco, such as cigarettes and e-cigarettes. If you need help quitting, ask your health care provider. You may shower after 24 hours, but Do not take baths, swim, or use a hot tub for 1 week.  Do not drink alcohol for 24 hours after your procedure. Keep all follow-up visits as  told by your health care provider. This is important. Dental Work: You will require antibiotics prior to any dental work, including cleanings, for 6 months after your Watchman implantation to help protect you from infection. After 6 months, antibiotics are no longer required. Contact a health care provider if: You have redness, mild swelling, or pain around your puncture site. You have  soreness in your throat or at your puncture site that does not improve after several days You have fluid or blood coming from your puncture site that stops after applying firm pressure to the area. Your puncture site feels warm to the touch. You have pus or a bad smell coming from your puncture site. You have a fever. You have chest pain or discomfort that spreads to your neck, jaw, or arm. You are sweating a lot. You feel nauseous. You have a fast or irregular heartbeat. You have shortness of breath. You are dizzy or light-headed and feel the need to lie down. You have pain or numbness in the arm or leg closest to your puncture site. Get help right away if: Your puncture site suddenly swells. Your puncture site is bleeding and the bleeding does not stop after applying firm pressure to the area. These symptoms may represent a serious problem that is an emergency. Do not wait to see if the symptoms will go away. Get medical help right away. Call your local emergency services (911 in the [place] Do not drive yourself to the hospital. Summary After the procedure, it is normal to have bruising and tenderness at the puncture site in your groin, neck, or forearm. Check your puncture site every day for signs of infection. Get help right away if your puncture site is bleeding and the bleeding does not stop after applying firm pressure to the area. This is a medical emergency.  This information is not intended to replace advice given to you by your health care provider. Make sure you discuss any questions you have with your health care provider.
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
## 2022-03-20 NOTE — Transfer of Care (Signed)
Immediate Anesthesia Transfer of Care Note  Patient: Melissa Jordan  Procedure(s) Performed: LEFT ATRIAL APPENDAGE OCCLUSION TRANSESOPHAGEAL ECHOCARDIOGRAM (TEE)  Patient Location: Cath Lab  Anesthesia Type:General  Level of Consciousness: drowsy and patient cooperative  Airway & Oxygen Therapy: Patient Spontanous Breathing and Patient connected to nasal cannula oxygen  Post-op Assessment: Report given to RN, Post -op Vital signs reviewed and stable and Patient moving all extremities  Post vital signs: Reviewed and stable  Last Vitals:  Vitals Value Taken Time  BP    Temp    Pulse    Resp    SpO2      Last Pain:  Vitals:   03/20/22 0928  PainSc: 3          Complications: There were no known notable events for this encounter.

## 2022-03-20 NOTE — Progress Notes (Signed)
  [place] TEAM  Patient doing well s/p Watchman. She is hemodynamically stable. Groin site is stable. Plan for early ambulation after bedrest completed and hopeful discharge over the next 24 hours.   Kathyrn Drown NP-C Structural Heart Team  Pager: [phone] Phone: [phone]

## 2022-03-20 NOTE — Discharge Summary (Addendum)
[facility]   MULTIDISCIPLINARY HEART TEAM   STRUCTURAL HEART PROCEDURE DISCHARGE SUMMARY   Patient ID: Melissa Jordan,  MRN: [id number], DOB/AGE: 02/02/1946 76 y.o.  Admit date: 03/20/2022 Discharge date: 03/21/2022  Primary Care Physician: Midge Minium, MD  Primary Cardiologist: Donato Heinz, MD  Electrophysiologist: Constance Haw, MD  Primary Discharge Diagnosis:  Paroxysmal Atrial Fibrillation Poor candidacy for long term anticoagulation due to  easy bruising / bleeding  Secondary Discharge Diagnosis:  HTN Hypothyroidism Anemia Bronchitis/PNA GERD Osteoarthritis  Procedures This Admission:  Transeptal Puncture Intra-procedural TEE which showed no LAA thrombus Left atrial appendage occlusive device placement on 03/20/22 by Dr. Quentin Ore.  This study demonstrated: IMPRESSIONS   1. Interventional TEE for LAA-O Procedure.   2. Prior to procedure, patent left atrial appendage.   3. Maximal diameter 2.3 cm with depth of 2.3 cm.   4. Mid inferior transeptal puncture.   5. Multiple recaptures to optimize positioning.   6. Placement of a 27 mm Watchman FLX device. No peri-device leak. There  is color flow adjacent to the device that is pulmonary vein flow  (confirmed by spectral Doppler) Small mitral shoulder. 20 % compression.   7. Small left to right shunt.   8. Trivial pericardial effusion unchanged throughout procedure.   9. Left ventricular ejection fraction, by estimation, is 55 to 60%. The  left ventricle has normal function.  10. Right ventricular systolic function is normal. The right ventricular  size is normal.  11. Left atrial size was moderately dilated. No left atrial/left atrial  appendage thrombus was detected.  12. Right atrial size was mildly dilated.  13. The mitral valve is grossly normal. Mild to moderate mitral valve  regurgitation. No evidence of mitral stenosis.  14. The aortic valve is tricuspid. There is mild  thickening of the aortic  valve. Aortic valve regurgitation is not visualized.  15. There is mild (Grade II) plaque involving the descending aorta.     Brief HPI: Melissa Jordan is a 76 y.o. female with a history of paroxysmal atrial fibrillation, HTN, hypothyroidism, anemia, bronchitis/PNA, GERD, and osteoarthritis.    Ms. Brasel was seen by Oda Kilts, PA-C on 11/11/21 and was doing well. She was having rare palpitations consistent with prior atrial fibrillation symptoms however this was not felt to be enough to quantify. At that time, her main complaint was very easy bruising and bleeding while taking Eliquis. She was asking about Watchman implant and was subsequently referred to Dr. Quentin Ore for further evaluation and was felt to be a good candidate to proceed.    Pre-watchman CT imaging showed moderate bi-atrial enlargement with no ASD/PFO with a large windsock appendage with no thrombus suitable for a 31 mm FLX device. Procedure scheduled for 03/20/22.   Hospital Course:  The patient was admitted and underwent Left Atrial appendage occlusive device placement with details as outlined above.  They were monitored on telemetry overnight which demonstrated sinus.  Groin was without complication on the day of discharge.  The patient was examined and considered to be stable for discharge. Wound care and restrictions were reviewed with the patient. The patient has been scheduled for post procedure follow up with Kathyrn Drown, NP in 1 month. Medication plan will be to continue Eliquis 5mg  BID. She will continue this until the 45 day mark at which time she will transition to Plavix 75mg  QD and stop Eliquis. She will continue Plavix for a total of 6 months. A repeat CT at  approximately 60 days will be performed to ensure proper seal of the device. She will need dental SBE at this time as well. She does have a PCN allergy therefore will need to use Azithromycin.   Of note, her BP was running low with SBP  in the 80-90s. Will hold home Losartan 50mg  daily. She will add it back if her BP becomes more elevated at home.     HAS-BLED score 2 Hypertension Yes  Abnormal renal and liver function (Dialysis, transplant, Cr >2.26 mg/dL /Cirrhosis or Bilirubin >2x Normal or AST/ALT/AP >3x Normal) No  Stroke No  Bleeding No  Labile INR (Unstable/high INR) No  Elderly (>65) Yes  Drugs or alcohol (? 8 drinks/week, anti-plt or NSAID) No    CHA2DS2-VASc Score = 4  The patient's score is based upon: CHF History: 0 HTN History: 1 Diabetes History: 0 Stroke History: 0 Vascular Disease History: 0 Age Score: 2 Gender Score: 1  Physical Exam: Vitals:   03/20/22 1948 03/20/22 2314 03/21/22 0329 03/21/22 0850  BP: 104/64 (!) 100/52 (!) 98/50 (!) 88/49  Pulse: 69 74 69   Resp: 17 17 15    Temp: 98 F (36.7 C) (!) 97.5 F (36.4 C) 97.9 F (36.6 C)   TempSrc: Oral Oral Oral   SpO2: 100% 100% 100%   Weight:      Height:        GEN- The patient is well appearing, alert and oriented x 3 today.   HEENT: normocephalic, atraumatic; sclera clear, conjunctiva pink; hearing intact; oropharynx clear; neck supple  Lungs- Clear to ausculation bilaterally, normal work of breathing.  No wheezes, rales, rhonchi Heart- Regular rate and rhythm, no murmurs, rubs or gallops  GI- soft, non-tender, non-distended, bowel sounds present  Extremities- no clubbing, cyanosis, or edema; DP/PT/radial pulses 2+ bilaterally, groin without hematoma/bruit MS- no significant deformity or atrophy Skin- warm and dry, no rash or lesion Psych- euthymic mood, full affect Neuro- strength and sensation are intact   Labs:   Lab Results  Component Value Date   WBC 8.2 03/03/2022   HGB 13.2 03/03/2022   HCT 37.3 03/03/2022   MCV 90 03/03/2022   PLT 183 03/03/2022    Recent Labs  Lab 03/21/22 0049  NA 139  K 4.1  CL 109  CO2 25  BUN 17  CREATININE 1.09*  CALCIUM 8.5*  GLUCOSE 119*     Discharge Medications:   Allergies as of 03/21/2022       Reactions   Lyrica [pregabalin] Palpitations, Other (See Comments)   "thought I was going to have a heart attack; heart started pounding so hard, I couldn't catch my breath" (06/28/2012)   Meclizine Other (See Comments)   "what was in my mind to say wasn't what came out to say; I was kind of in another world" (06/28/2012)   Penicillins Itching, Rash   Has patient had a PCN reaction causing immediate rash, facial/tongue/throat swelling, SOB or lightheadedness with hypotension: No Has patient had a PCN reaction causing severe rash involving mucus membranes or skin necrosis: No Has patient had a PCN reaction that required hospitalization No Has patient had a PCN reaction occurring within the last 10 years:   # # YES # #  If all of the above answers are "NO", then may proceed with Cephalosporin use. Has tolerated cefazolin (2020, 2023)   Poison Sumac Extract Swelling, Rash, Other (See Comments)   Blisters   Propoxyphene Nausea And Vomiting   Bactrim [sulfamethoxazole-trimethoprim] Rash  Codeine Nausea Only   Morphine Sulfate Rash   Propoxyphene N-acetaminophen Nausea And Vomiting   "Darvocet"   Vancomycin Rash        Medication List     STOP taking these medications    doxycycline 100 MG tablet Commonly known as: VIBRA-TABS   famotidine 20 MG tablet Commonly known as: PEPCID   losartan 50 MG tablet Commonly known as: COZAAR       TAKE these medications    acetaminophen 500 MG tablet Commonly known as: TYLENOL Take 1,000 mg by mouth every 6 (six) hours as needed for moderate pain.   apixaban 5 MG Tabs tablet Commonly known as: Eliquis Take 1 tablet (5 mg total) by mouth 2 (two) times daily.   azelastine 0.1 % nasal spray Commonly known as: ASTELIN Place 1 spray into both nostrils 2 (two) times daily. Use in each nostril as directed What changed:  when to take this reasons to take this   CALCIUM-VITAMIN D PO Take 1 tablet by mouth  at bedtime.   estradiol 1 MG tablet Commonly known as: ESTRACE TAKE 1 TABLET BY MOUTH  DAILY   levothyroxine 25 MCG tablet Commonly known as: SYNTHROID Take 1 tablet (25 mcg total) by mouth daily. What changed:  when to take this additional instructions   loratadine 10 MG tablet Commonly known as: CLARITIN Take 10 mg by mouth in the morning.   magnesium oxide 400 MG tablet Commonly known as: MAG-OX Take 400 mg by mouth at bedtime.   methocarbamol 750 MG tablet Commonly known as: ROBAXIN Take 750 mg by mouth daily as needed for muscle spasms.   metoprolol tartrate 25 MG tablet Commonly known as: LOPRESSOR TAKE 1 TABLET BY MOUTH  TWICE DAILY   rOPINIRole 3 MG tablet Commonly known as: REQUIP TAKE 1 TABLET BY MOUTH AT BEDTIME   Systane 0.4-0.3 % Soln Generic drug: Polyethyl Glycol-Propyl Glycol Place 1 drop into both eyes daily.   traMADol 50 MG tablet Commonly known as: ULTRAM Take 50 mg by mouth every 6 (six) hours as needed for pain.   triamcinolone ointment 0.1 % Commonly known as: KENALOG Apply 1 Application topically 2 (two) times daily. What changed:  when to take this reasons to take this   Vitamin D 125 MCG (5000 UT) Caps Take 5,000 Units by mouth in the morning.        Disposition:  none   Follow-up Information     Tommie Raymond, NP Follow up on 04/28/2022.   Specialty: Cardiology Why: @ 12pm. Please arrive at 11:30am. Contact information: [address] [place] [address] [phone]                 Duration of Discharge Encounter: Greater than 30 minutes including physician time.  Signed, Angelena Form, PA-C  03/21/2022 9:06 AM

## 2022-03-20 NOTE — Progress Notes (Signed)
Purewick placed, peri care given, safety maintained

## 2022-03-20 NOTE — Anesthesia Procedure Notes (Addendum)
Procedure Name: Intubation Date/Time: 03/20/2022 11:38 AM  Performed by: Janene Harvey, CRNAPre-anesthesia Checklist: Patient identified, Emergency Drugs available, Suction available and Patient being monitored Patient Re-evaluated:Patient Re-evaluated prior to induction Oxygen Delivery Method: Circle system utilized Preoxygenation: Pre-oxygenation with 100% oxygen Induction Type: IV induction Ventilation: Mask ventilation without difficulty Laryngoscope Size: Mac and 3 Grade View: Grade III Tube type: Oral Tube size: 7.5 mm Number of attempts: 1 Airway Equipment and Method: Stylet and Oral airway Placement Confirmation: ETT inserted through vocal cords under direct vision, positive ETCO2 and breath sounds checked- equal and bilateral Secured at: 21 cm Tube secured with: Tape Dental Injury: Teeth and Oropharynx as per pre-operative assessment

## 2022-03-20 NOTE — Progress Notes (Signed)
03/20/2022 1825 Received pt to room 4E-01 from Cath lab.  Pt is A&O, no C/O voiced.  Tele monitor applied and CCMD notified.  Oriented to room, call light and bed.  Call bell in reach. [place]

## 2022-03-20 NOTE — Anesthesia Preprocedure Evaluation (Signed)
Anesthesia Evaluation  Patient identified by MRN, date of birth, ID band Patient awake    Reviewed: Allergy & Precautions, H&P , NPO status , Patient's Chart, lab work & pertinent test results  History of Anesthesia Complications (+) PONV and history of anesthetic complications  Airway Mallampati: II   Neck ROM: full    Dental   Pulmonary neg pulmonary ROS,    breath sounds clear to auscultation       Cardiovascular hypertension, + dysrhythmias Atrial Fibrillation  Rhythm:irregular Rate:Normal     Neuro/Psych  Neuromuscular disease    GI/Hepatic GERD  ,  Endo/Other  Hypothyroidism   Renal/GU      Musculoskeletal  (+) Arthritis ,   Abdominal   Peds  Hematology   Anesthesia Other Findings   Reproductive/Obstetrics                             Anesthesia Physical Anesthesia Plan  ASA: 3  Anesthesia Plan: General   Post-op Pain Management:    Induction: Intravenous  PONV Risk Score and Plan: 4 or greater and Ondansetron, Dexamethasone and Treatment may vary due to age or medical condition  Airway Management Planned: Oral ETT  Additional Equipment: ClearSight and TEE  Intra-op Plan:   Post-operative Plan: Extubation in OR  Informed Consent: I have reviewed the patients History and Physical, chart, labs and discussed the procedure including the risks, benefits and alternatives for the proposed anesthesia with the patient or authorized representative who has indicated his/her understanding and acceptance.     Dental advisory given  Plan Discussed with: CRNA, Anesthesiologist and Surgeon  Anesthesia Plan Comments:         Anesthesia Quick Evaluation

## 2022-03-21 ENCOUNTER — Encounter (HOSPITAL_COMMUNITY): Payer: Self-pay | Admitting: Cardiology

## 2022-03-21 DIAGNOSIS — I480 Paroxysmal atrial fibrillation: Secondary | ICD-10-CM | POA: Diagnosis not present

## 2022-03-21 DIAGNOSIS — I110 Hypertensive heart disease with heart failure: Secondary | ICD-10-CM | POA: Diagnosis not present

## 2022-03-21 DIAGNOSIS — E039 Hypothyroidism, unspecified: Secondary | ICD-10-CM | POA: Diagnosis not present

## 2022-03-21 DIAGNOSIS — J189 Pneumonia, unspecified organism: Secondary | ICD-10-CM | POA: Diagnosis not present

## 2022-03-21 LAB — BASIC METABOLIC PANEL
Anion gap: 5 (ref 5–15)
BUN: 17 mg/dL (ref 8–23)
CO2: 25 mmol/L (ref 22–32)
Calcium: 8.5 mg/dL — ABNORMAL LOW (ref 8.9–10.3)
Chloride: 109 mmol/L (ref 98–111)
Creatinine, Ser: 1.09 mg/dL — ABNORMAL HIGH (ref 0.44–1.00)
GFR, Estimated: 53 mL/min — ABNORMAL LOW (ref >60)
Glucose, Bld: 119 mg/dL — ABNORMAL HIGH (ref 70–99)
Potassium: 4.1 mmol/L (ref 3.5–5.1)
Sodium: 139 mmol/L (ref 135–145)

## 2022-03-21 MED FILL — Midazolam HCl Inj 2 MG/2ML (Base Equivalent): INTRAMUSCULAR | Qty: 2 | Status: AC

## 2022-03-21 MED FILL — Midazolam HCl Inj 5 MG/5ML (Base Equivalent): INTRAMUSCULAR | Qty: 2 | Status: AC

## 2022-03-21 NOTE — Progress Notes (Signed)
Discharge instructions (including medications) discussed with and copy provided to patient/caregiver 

## 2022-03-21 NOTE — Anesthesia Postprocedure Evaluation (Signed)
Anesthesia Post Note  Patient: Melissa Jordan  Procedure(s) Performed: LEFT ATRIAL APPENDAGE OCCLUSION TRANSESOPHAGEAL ECHOCARDIOGRAM (TEE)     Patient location during evaluation: PACU Anesthesia Type: General Level of consciousness: awake and alert Pain management: pain level controlled Vital Signs Assessment: post-procedure vital signs reviewed and stable Respiratory status: spontaneous breathing, nonlabored ventilation, respiratory function stable and patient connected to nasal cannula oxygen Cardiovascular status: blood pressure returned to baseline and stable Postop Assessment: no apparent nausea or vomiting Anesthetic complications: no   There were no known notable events for this encounter.  Last Vitals:  Vitals:   03/20/22 2314 03/21/22 0329  BP: (!) 100/52 (!) 98/50  Pulse: 74 69  Resp: 17 15  Temp: (!) 36.4 C 36.6 C  SpO2: 100% 100%    Last Pain:  Vitals:   03/21/22 0434  TempSrc:   PainSc: [place]

## 2022-03-21 NOTE — Plan of Care (Signed)
  Problem: Education: Goal: Knowledge of cardiac device and self-care will improve Outcome: Adequate for Discharge Goal: Ability to safely manage health related needs after discharge will improve Outcome: Adequate for Discharge   Problem: Cardiac: Goal: Ability to achieve and maintain adequate cardiopulmonary perfusion will improve Outcome: Adequate for Discharge   Problem: Education: Goal: Understanding of CV disease, CV risk reduction, and recovery process will improve Outcome: Adequate for Discharge   Problem: Activity: Goal: Ability to return to baseline activity level will improve Outcome: Adequate for Discharge   Problem: Cardiovascular: Goal: Ability to achieve and maintain adequate cardiovascular perfusion will improve Outcome: Adequate for Discharge Goal: Vascular access site(s) Level 0-1 will be maintained Outcome: Adequate for Discharge   Problem: Health Behavior/Discharge Planning: Goal: Ability to safely manage health-related needs after discharge will improve Outcome: Adequate for Discharge   Problem: Education: Goal: Knowledge of General Education information will improve Description: Including pain rating scale, medication(s)/side effects and non-pharmacologic comfort measures Outcome: Adequate for Discharge   Problem: Pain Managment: Goal: General experience of comfort will improve Outcome: Adequate for Discharge   Problem: Safety: Goal: Ability to remain free from injury will improve Outcome: Adequate for Discharge   Problem: Skin Integrity: Goal: Risk for impaired skin integrity will decrease Outcome: Adequate for Discharge

## 2022-03-21 NOTE — TOC Transition Note (Signed)
Transition of Care (TOC) - CM/SW Discharge Note Marvetta Gibbons RN, BSN Transitions of Care Unit 4E- RN Case Manager See Treatment Team for direct phone #    Patient Details  Name: ADAM DEMARY MRN: [id number] Date of Birth: Jun 16, 1946  Transition of Care [facility] CM/SW Contact:  Dawayne Patricia, RN Phone Number: 03/21/2022, 10:30 AM   Clinical Narrative:    Pt stable for transition home today, Transition of Care Department [facility]) has reviewed patient and no TOC needs have been identified at this time.   Final next level of care: Home/Self Care Barriers to Discharge: No Barriers Identified   Patient Goals and CMS Choice     Choice offered to / list presented to : NA  Discharge Placement                 Home      Discharge Plan and Services                DME Arranged: N/A DME Agency: NA       HH Arranged: NA HH Agency: NA        Social Determinants of Health (SDOH) Interventions     Readmission Risk Interventions    03/21/2022   10:30 AM 09/30/2019   10:13 AM  Readmission Risk Prevention Plan  Post Dischage Appt Complete Complete  Medication Screening Complete Complete  Transportation Screening Complete Complete

## 2022-03-24 ENCOUNTER — Emergency Department (HOSPITAL_BASED_OUTPATIENT_CLINIC_OR_DEPARTMENT_OTHER): Payer: Medicare Other

## 2022-03-24 ENCOUNTER — Emergency Department (HOSPITAL_BASED_OUTPATIENT_CLINIC_OR_DEPARTMENT_OTHER)
Admission: EM | Admit: 2022-03-24 | Discharge: 2022-03-24 | Disposition: A | Discharge status: Home / Self Care | Payer: Medicare Other | Attending: Emergency Medicine | Admitting: Emergency Medicine

## 2022-03-24 ENCOUNTER — Telehealth: Payer: Self-pay | Admitting: Cardiology

## 2022-03-24 ENCOUNTER — Encounter (HOSPITAL_BASED_OUTPATIENT_CLINIC_OR_DEPARTMENT_OTHER): Payer: Self-pay | Admitting: Emergency Medicine

## 2022-03-24 ENCOUNTER — Other Ambulatory Visit: Payer: Self-pay

## 2022-03-24 DIAGNOSIS — S6991XA Unspecified injury of right wrist, hand and finger(s), initial encounter: Secondary | ICD-10-CM | POA: Diagnosis present

## 2022-03-24 DIAGNOSIS — G894 Chronic pain syndrome: Secondary | ICD-10-CM | POA: Diagnosis not present

## 2022-03-24 DIAGNOSIS — Z79899 Other long term (current) drug therapy: Secondary | ICD-10-CM | POA: Diagnosis not present

## 2022-03-24 DIAGNOSIS — S61451A Open bite of right hand, initial encounter: Secondary | ICD-10-CM | POA: Diagnosis not present

## 2022-03-24 DIAGNOSIS — Z23 Encounter for immunization: Secondary | ICD-10-CM | POA: Insufficient documentation

## 2022-03-24 DIAGNOSIS — S61051A Open bite of right thumb without damage to nail, initial encounter: Secondary | ICD-10-CM | POA: Insufficient documentation

## 2022-03-24 DIAGNOSIS — M461 Sacroiliitis, not elsewhere classified: Secondary | ICD-10-CM | POA: Diagnosis not present

## 2022-03-24 DIAGNOSIS — T63001A Toxic effect of unspecified snake venom, accidental (unintentional), initial encounter: Secondary | ICD-10-CM | POA: Diagnosis not present

## 2022-03-24 DIAGNOSIS — Z7901 Long term (current) use of anticoagulants: Secondary | ICD-10-CM | POA: Diagnosis not present

## 2022-03-24 DIAGNOSIS — W5911XA Bitten by nonvenomous snake, initial encounter: Secondary | ICD-10-CM | POA: Insufficient documentation

## 2022-03-24 DIAGNOSIS — M5440 Lumbago with sciatica, unspecified side: Secondary | ICD-10-CM | POA: Diagnosis not present

## 2022-03-24 MED ORDER — BACITRACIN ZINC 500 UNIT/GM EX OINT: TOPICAL_OINTMENT | Freq: Once | CUTANEOUS | Status: AC

## 2022-03-24 MED ORDER — TETANUS-DIPHTH-ACELL PERTUSSIS 5-2.5-18.5 LF-MCG/0.5 IM SUSY
0.5000 mL | PREFILLED_SYRINGE | Freq: Once | INTRAMUSCULAR | Status: AC
  Administered 2022-03-24: 0.5 mL via INTRAMUSCULAR
  Filled 2022-03-24: qty 0.5

## 2022-03-24 MED ORDER — LIDOCAINE HCL (PF) 1 % IJ SOLN
30.0000 mL | Freq: Once | INTRAMUSCULAR | Status: DC
  Filled 2022-03-24: qty 30

## 2022-03-24 NOTE — ED Provider Notes (Signed)
[facility] EMERGENCY DEPARTMENT Provider Note   CSN: [id number] Arrival date & time: 03/24/22  1453     History  Chief Complaint  Patient presents with   Snake Bite    Melissa Jordan is a 76 y.o. female.  Presents the ED for evaluation of stable.  She states that she was in her typical approximately 1 hour prior to arrival when a snake attempted to bite her to get in her hand normally.  Reports she was bitten on the right thumb.  States she gets skin tears easily because her skin is healthy.  Reports only localized pain.  No numbness or tingling.  No nausea, vomiting, abdominal pain, cramping, fevers.  Patient did have a picture of the snake and believes that is the common black snake.  Has low suspicion for pit viper or copperhead.   HPI     Home Medications Prior to Admission medications   Medication Sig Start Date End Date Taking? Authorizing Provider  acetaminophen (TYLENOL) 500 MG tablet Take 1,000 mg by mouth every 6 (six) hours as needed for moderate pain.     [provider]  apixaban (ELIQUIS) 5 MG TABS tablet Take 1 tablet (5 mg total) by mouth 2 (two) times daily. 01/28/22   Little Ishikawa, MD  azelastine (ASTELIN) 0.1 % nasal spray Place 1 spray into both nostrils 2 (two) times daily. Use in each nostril as directed Patient taking differently: Place 1 spray into both nostrils 2 (two) times daily as needed (nasal congestion.). Use in each nostril as directed 05/14/21   Janeece Agee, NP  CALCIUM-VITAMIN D PO Take 1 tablet by mouth at bedtime.    [provider]  Cholecalciferol (VITAMIN D) 125 MCG (5000 UT) CAPS Take 5,000 Units by mouth in the morning.    [provider]  estradiol (ESTRACE) 1 MG tablet TAKE 1 TABLET BY MOUTH  DAILY 01/01/22   Sheliah Hatch, MD  levothyroxine (SYNTHROID) 25 MCG tablet Take 1 tablet (25 mcg total) by mouth daily. Patient taking differently: Take 25 mcg by mouth See admin instructions.  Take 1 tablet (25 mcg) by mouth on Mondays- Fridays. Take 2 tablets (50 mcg) by mouth on Saturdays & Sundays. 04/10/21   Sheliah Hatch, MD  loratadine (CLARITIN) 10 MG tablet Take 10 mg by mouth in the morning.    [provider]  magnesium oxide (MAG-OX) 400 MG tablet Take 400 mg by mouth at bedtime.    [provider]  methocarbamol (ROBAXIN) 750 MG tablet Take 750 mg by mouth daily as needed for muscle spasms.    [provider]  metoprolol tartrate (LOPRESSOR) 25 MG tablet TAKE 1 TABLET BY MOUTH  TWICE DAILY 08/05/21   Sheliah Hatch, MD  Polyethyl Glycol-Propyl Glycol (SYSTANE) 0.4-0.3 % SOLN Place 1 drop into both eyes daily.    [provider]  rOPINIRole (REQUIP) 3 MG tablet TAKE 1 TABLET BY MOUTH AT BEDTIME 01/13/22   Sheliah Hatch, MD  traMADol (ULTRAM) 50 MG tablet Take 50 mg by mouth every 6 (six) hours as needed for pain. 02/19/22   [provider]  triamcinolone ointment (KENALOG) 0.1 % Apply 1 Application topically 2 (two) times daily. Patient taking differently: Apply 1 Application topically 2 (two) times daily as needed (skin irritation/rash--allergic reaction.). 12/16/21 12/16/22  Sheliah Hatch, MD      Allergies    Lyrica [pregabalin], Meclizine, Penicillins, Poison sumac extract, Propoxyphene, Bactrim [sulfamethoxazole-trimethoprim], Codeine, Morphine sulfate,  Propoxyphene n-acetaminophen, and Vancomycin    Review of Systems   Review of Systems  Skin:  Positive for wound.  All other systems reviewed and are negative.   Physical Exam Updated Vital Signs BP (!) 146/64 (BP Location: Right Arm)   Pulse 65   Temp 98 F (36.7 C) (Oral)   Resp 16   Ht 5\' 3"  (1.6 m)   Wt 65.3 kg   SpO2 98%   BMI 25.50 kg/m  Physical Exam Vitals and nursing note reviewed.  Constitutional:      General: She is not in acute distress.    Appearance: Normal appearance. She is normal weight. She is not ill-appearing.  HENT:      Head: Normocephalic and atraumatic.  Pulmonary:     Effort: Pulmonary effort is normal. No respiratory distress.  Abdominal:     General: Abdomen is flat.  Musculoskeletal:        General: Normal range of motion.     Cervical back: Neck supple.  Skin:    General: Skin is warm and dry.     Capillary Refill: Capillary refill takes less than 2 seconds.     Comments: Snakebite wound to right thenar eminence with skin tear.  No active bleeding.  Neurological:     Mental Status: She is alert and oriented to person, place, and time.  Psychiatric:        Mood and Affect: Mood normal.        Behavior: Behavior normal.     ED Results / Procedures / Treatments   Labs (all labs ordered are listed, but only abnormal results are displayed) Labs Reviewed - No data to display  EKG None  Radiology DG Hand Complete Right  Result Date: 03/24/2022 CLINICAL DATA:  Snake bite EXAM: RIGHT HAND - COMPLETE 3+ VIEW COMPARISON:  None Available. FINDINGS: No fracture or dislocation is seen. There are no opaque foreign bodies. Degenerative changes are noted in multiple interphalangeal joints, more severe in the distal interphalangeal joint of the ring finger. IMPRESSION: No fracture or dislocation is seen. There are no radiopaque foreign bodies. Electronically Signed   By: 05/24/2022 M.D.   On: 03/24/2022 16:47    Procedures Procedures    Medications Ordered in ED Medications  lidocaine (PF) (XYLOCAINE) 1 % injection 30 mL (30 mLs Infiltration Not Given 03/24/22 1835)  bacitracin ointment (has no administration in time range)  Tdap (BOOSTRIX) injection 0.5 mL (0.5 mLs Intramuscular Given 03/24/22 1745)    ED Course/ Medical Decision Making/ A&P Clinical Course as of 03/24/22 1915  Mon Mar 24, 2022  1710 DG Hand Complete Right Personally reviewed the images.  No fractures dislocations or foreign bodies [AS]    Clinical Course User Index [AS] Ineta Sinning, Mar 26, 2022, PA-C                            Medical Decision Making Amount and/or Complexity of Data Reviewed Radiology: ordered. Decision-making details documented in ED Course.  Risk OTC drugs. Prescription drug management.  This patient presents to the ED for concern of snakebite to right thumb   Additional history obtained from: Nursing notes from this visit.  I ordered imaging studies including x-ray and I independently visualized and interpreted imaging which showed no fractures, dislocations or foreign bodies I agree with the radiologist interpretation  Patient is a 76 year old female presents ED for evaluation of snakebite of right thenar eminence.  X-ray shows no  abnormalities.  Neurologic vascular status intact.  This was irrigated with 1 L of normal saline, further cleaned with povidone iodine, then placed bacitracin on the area before applying clean bandaging.  Skin marker was used to outline wound edges on initial evaluation and patient was monitored for 2 hours afterwards.  No increasing erythema or swelling in the area.  I have low suspicion for need for antivenom or any quite labs as she did show me the picture of of the snake and it does not appear to be a copperhead.  Patient was given strict return precautions and told to follow-up with her primary care provider in the next week.  Stable discharge.  At this time there does not appear to be any evidence of an acute emergency medical condition and the patient appears stable for discharge with appropriate outpatient follow up. Diagnosis was discussed with patient who verbalizes understanding of care plan and is agreeable to discharge. I have discussed return precautions with patient and husband who verbalizes understanding. Patient encouraged to follow-up with their PCP within 1 week. All questions answered.  Patient's case discussed with Dr. Sherry Ruffing who agrees with plan to discharge with follow-up.   Note: Portions of this report may have been transcribed using  voice recognition software. Every effort was made to ensure accuracy; however, inadvertent computerized transcription errors may still be present.          Final Clinical Impression(s) / ED Diagnoses Final diagnoses:  Snake bite, initial encounter    Rx / DC Orders ED Discharge Orders     None         Nehemiah Massed 03/24/22 1921    Tegeler, Gwenyth Allegra, MD 03/25/22 0003

## 2022-03-24 NOTE — ED Triage Notes (Addendum)
States bitten by snake that was in her chx houser t hand  at the thumb pad , some bleeding pt states has been squeezing it

## 2022-03-24 NOTE — Discharge Instructions (Signed)
You have been seen today for your complaint of snake bite. Your imaging showed no abnormalities. Home care instructions are as follows:  You should keep the area clean and dry for 24 hours. You may clean it with soap and water daily after that. Monitor for signs of infection. Follow up with: your primary care provider in one week. Please seek immediate medical care if you develop any of the following symptoms: You develop blood blisters or purple spots in the bite area. You have: Nausea or vomiting. Numbness or tingling. Excessive sweating. Trouble breathing or seeing. You feel very confused. You feel faint or light-headed. At this time there does not appear to be the presence of an emergent medical condition, however there is always the potential for conditions to change. Please read and follow the below instructions.  Do not take your medicine if  develop an itchy rash, swelling in your mouth or lips, or difficulty breathing; call 911 and seek immediate emergency medical attention if this occurs.  You may review your lab tests and imaging results in their entirety on your MyChart account.  Please discuss all results of fully with your primary care provider and other specialist at your follow-up visit.  Note: Portions of this text may have been transcribed using voice recognition software. Every effort was made to ensure accuracy; however, inadvertent computerized transcription errors may still be present.

## 2022-03-24 NOTE — ED Triage Notes (Signed)
Pt reports being bitten by snake that was in chicken house-reports that snake was possibly black snake. Bleeding controlled, bandage applied in triage, no swelling noted

## 2022-03-24 NOTE — ED Notes (Signed)
Applied bacitracin, nonstick gauze and Kerlix and explained how to wrap it until wounds heal over. Pt understood and some supplied were given for home use.

## 2022-03-24 NOTE — Telephone Encounter (Signed)
  [place] Team  Contacted the patient regarding discharge from [facility] on 03/21/22  The patient understands to follow up with Kathyrn Drown, NP-C   The patient understands discharge instructions? Yes  The patient understands medications and regimen? Yes   The patient reports groin site looks stable with no bleeding issues.  The patient understands to call with any questions or concerns prior to scheduled visit.    Kathyrn Drown NP-C Structural Heart Team  Pager: [phone] Phone: [phone]

## 2022-03-25 ENCOUNTER — Telehealth: Payer: Self-pay | Admitting: Family Medicine

## 2022-03-25 NOTE — Telephone Encounter (Signed)
Research indicates that prophylactic antibiotics after a non-venomous snake bite are not recommended as this doesn't seem to improve outcomes and leads to antibiotic resistance.  If there are visible signs of infection or she has concerns, that's a different story and we would be happy to evaluate it in the office

## 2022-03-25 NOTE — Telephone Encounter (Signed)
Spoke with pt and she states she got bite by a black snake on right thumb she went to the ER yesterday right after it happened  They cleaned it and made her wait there for 3 hours did not give her anything for the bite she states they kept telling her how much bacteria snakes carry but never gave her anything for infection  Finger is swollen  Skin is pulled back but no discoloration  She states she is not having chills , no fever or vomiting or nausea just a headache they did give pt a TDAP injection

## 2022-03-25 NOTE — Telephone Encounter (Signed)
Called and informed pt she does have follow up in one week advised if any redness, swelling, heat or discharge to call back and be worked in sooner

## 2022-03-25 NOTE — Telephone Encounter (Signed)
Caller name: Mira  On DPR? :yes/no: Yes  Call back number: [phone]  Provider they see: Birdie Riddle  Reason for call: calling to let Dr. Birdie Riddle know that she went to ED 03/24/22 for a snake bite. States that she did not receive any antibiotics and is concerned about bacteria.

## 2022-03-26 ENCOUNTER — Telehealth: Payer: Self-pay

## 2022-03-26 NOTE — Patient Outreach (Signed)
  Care Coordination TOC Note Transition Care Management Follow-up Telephone Call Date of discharge and from where: 03/21/22-[place] How have you been since you were released from the hospital? Patient reports that she was doing well until recent snake bite she had on 03/24/22. She shares that she was digging into straw pile without gloves and snake bit her. She went to the ED was treated and released. Area is slowly healing. Pain has improved. She will wear protective gloves the next time she has to do that. 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  Any questions or concerns? No  Items Reviewed: Did the pt receive and understand the discharge instructions provided? Yes  Medications obtained and verified? Yes  Other? No  Any new allergies since your discharge? No  Dietary orders reviewed? Yes Do you have support at home? Yes   Home Care and Equipment/Supplies: Were home health services ordered? not applicable If so, what is the name of the agency? N/A  Has the agency set up a time to come to the patient's home? not applicable Were any new equipment or medical supplies ordered?  No What is the name of the medical supply agency? N/A Were you able to get the supplies/equipment? not applicable Do you have any questions related to the use of the equipment or supplies? No  Functional Questionnaire: (I = Independent and D = Dependent) ADLs: I  Bathing/Dressing- I  Meal Prep- I  Eating- I  Maintaining continence- I  Transferring/Ambulation- I  Managing Meds- I  Follow up appointments reviewed:  PCP Hospital f/u appt confirmed? Yes  Scheduled to see Dr. Birdie Riddle on 03/31/22 @ 9:20am. [facility] f/u appt confirmed? Yes  Scheduled to see Karolee Stamps on 04/28/22 @ 12N. Are transportation arrangements needed? No  If their condition worsens, is the pt aware to call PCP or go to the Emergency Dept.? Yes Was the patient provided with contact information for  the PCP's office or ED? Yes Was to pt encouraged to call back with questions or concerns? Yes  SDOH assessments and interventions completed:   Yes  Care Coordination Interventions Activated:  Yes   Care Coordination Interventions:  Education provided    Encounter Outcome:  Pt. Visit Completed    Enzo Montgomery, RN,BSN,CCM [place] [facility] Telephonic Care Management Coordinator Direct Phone: [phone] Toll Free: [phone] Fax: [phone]

## 2022-03-27 ENCOUNTER — Inpatient Hospital Stay: Admission: RE | Admit: 2022-03-27 | Payer: Medicare Other | Source: Ambulatory Visit

## 2022-03-28 ENCOUNTER — Encounter: Payer: Self-pay | Admitting: Family Medicine

## 2022-03-28 ENCOUNTER — Ambulatory Visit: Payer: Medicare Other | Admitting: Family Medicine

## 2022-03-28 VITALS — BP 118/68 | HR 74 | Temp 97.6°F | Resp 16 | Ht 63.0 in | Wt 148.1 lb

## 2022-03-28 DIAGNOSIS — I891 Lymphangitis: Secondary | ICD-10-CM

## 2022-03-28 DIAGNOSIS — T63001A Toxic effect of unspecified snake venom, accidental (unintentional), initial encounter: Secondary | ICD-10-CM

## 2022-03-28 DIAGNOSIS — W5911XA Bitten by nonvenomous snake, initial encounter: Secondary | ICD-10-CM | POA: Diagnosis not present

## 2022-03-28 MED ORDER — CEFDINIR 300 MG PO CAPS: 300.0000 mg | ORAL_CAPSULE | Freq: Two times a day (BID) | ORAL | 0 refills | Status: DC

## 2022-03-28 NOTE — Patient Instructions (Addendum)
Follow up as needed or as scheduled Please continue to monitor the arm streaking.  This should improve w/ 24-48 hrs of antibiotics START the Cefdinir tomorrow and take this twice daily (w/ food) for 10 days (you have taken this before without difficulty) Keep wound clean and dry- it actually looks pretty good! Call with any questions or concerns Hang in there!!!

## 2022-03-28 NOTE — Progress Notes (Signed)
   Subjective:    Patient ID: Melissa Jordan, female    DOB: Jun 21, 1946, 76 y.o.   MRN: [id number]  HPI Snake bite- occurred 9/30.  Bit on R thenar eminence.  Tetanus was updated.  Bite was irrigated.  She did have a skin tear w/ attached flap at that time.  Skin has since 'laid back down'.  Area is very painful.  Continues to have some scant bleeding.  Now has red streaking all the way up her arm into axilla (noted last night).  Some tender axillary LNs.  No fevers.   Review of Systems For ROS see HPI     Objective:   Physical Exam Vitals reviewed.  Constitutional:      General: She is not in acute distress.    Appearance: Normal appearance. She is not ill-appearing.  Cardiovascular:     Pulses: Normal pulses.  Musculoskeletal:     Right lower leg: No edema.     Left lower leg: No edema.  Skin:    Findings: Erythema (lymphangitic streaking from R wrist to axilla) present.     Comments: Seeping wound of R thenar eminence and dorsum of hand.  Wound is oozing serosanguinous drainage.  No pus present.  TTP.  Currently resembles more of a skin tear than a puncture wound  Neurological:     General: No focal deficit present.     Mental Status: She is alert and oriented to person, place, and time.  Psychiatric:        Mood and Affect: Mood normal.        Behavior: Behavior normal.        Thought Content: Thought content normal.           Assessment & Plan:   Non venomous snake bite- new.  Bit itself does not look bad but she has since developed Lymphangitis.  Tdap was updated in ER.  Will start 3rd generation cephalosporin as was indicated in the literature for a non venomous bite.  Pt expressed understanding and is in agreement w/ plan.   Lymphangitis- new.  Pt given 1 gram of Rocephin in office and will start 3rd generation cephalosporin tomorrow.  She was given instructions to monitor redness and if not improving after 36-48 hrs she is to let us know.  Pt expressed  understanding and is in agreement w/ plan.

## 2022-03-31 ENCOUNTER — Ambulatory Visit: Payer: Medicare Other | Admitting: Family Medicine

## 2022-04-01 MED ORDER — CEFTRIAXONE SODIUM 1 G IJ SOLR
1.0000 g | Freq: Once | INTRAMUSCULAR | Status: AC
  Administered 2022-03-28: 1 g via INTRAMUSCULAR

## 2022-04-01 NOTE — Addendum Note (Signed)
Addended byPauline Good, Perry Brucato on: 04/01/2022 12:55 PM   Modules accepted: Orders

## 2022-04-05 ENCOUNTER — Other Ambulatory Visit: Payer: Self-pay | Admitting: Family Medicine

## 2022-04-15 ENCOUNTER — Other Ambulatory Visit: Payer: Self-pay | Admitting: Family Medicine

## 2022-04-16 ENCOUNTER — Telehealth: Payer: Self-pay

## 2022-04-16 NOTE — Telephone Encounter (Signed)
Request sent in for refills, refills had already been provided by other means closed encounter
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
## 2022-04-25 ENCOUNTER — Telehealth: Payer: Self-pay | Admitting: Family Medicine

## 2022-04-25 NOTE — Telephone Encounter (Signed)
Left message for patient to call back and schedule Medicare Annual Wellness Visit (AWV) in office.   If not able to come in office, please offer to do virtually or by telephone.  Left office number and my jabber #[phone].  Last AWV:05/02/2021   Please schedule at anytime with Nurse Health Advisor.  

## 2022-04-28 ENCOUNTER — Ambulatory Visit: Payer: Medicare Other

## 2022-04-28 ENCOUNTER — Encounter: Payer: Self-pay | Admitting: Family Medicine

## 2022-04-28 ENCOUNTER — Ambulatory Visit (INDEPENDENT_AMBULATORY_CARE_PROVIDER_SITE_OTHER): Payer: Medicare Other | Admitting: Family Medicine

## 2022-04-28 VITALS — BP 108/70 | HR 61 | Temp 98.9°F | Resp 16 | Ht 63.0 in | Wt 148.1 lb

## 2022-04-28 DIAGNOSIS — R300 Dysuria: Secondary | ICD-10-CM | POA: Diagnosis not present

## 2022-04-28 DIAGNOSIS — B9689 Other specified bacterial agents as the cause of diseases classified elsewhere: Secondary | ICD-10-CM | POA: Diagnosis not present

## 2022-04-28 DIAGNOSIS — J329 Chronic sinusitis, unspecified: Secondary | ICD-10-CM

## 2022-04-28 LAB — POCT URINALYSIS DIPSTICK
Blood, UA: POSITIVE
Glucose, UA: NEGATIVE
Ketones, UA: POSITIVE
Leukocytes, UA: NEGATIVE
Nitrite, UA: POSITIVE
Protein, UA: POSITIVE — AB
Spec Grav, UA: 1.02 (ref 1.010–1.025)
Urobilinogen, UA: 0.2 E.U./dL
pH, UA: 7.5 (ref 5.0–8.0)

## 2022-04-28 MED ORDER — CEPHALEXIN 500 MG PO CAPS: 500.0000 mg | ORAL_CAPSULE | Freq: Three times a day (TID) | ORAL | 0 refills | Status: AC

## 2022-04-28 NOTE — Patient Instructions (Signed)
Follow up in as needed or as scheduled START the Cephalexin 3x/day (w/ food) to treat both the sinuses and urine Drink LOTS of fluids Continue the Mucinex for congestion REST!!! Call with any questions or concerns Hang in there!!!

## 2022-04-28 NOTE — Progress Notes (Unsigned)
   Subjective:    Patient ID: Melissa Jordan, female    DOB: 04-Jul-1945, 76 y.o.   MRN: [id number]  HPI Dysuria- sxs started Friday night w/ dysuria and foul smelling urine.  Pain at the end of urination.  Increased frequency and urgency but very little output.  No visible blood.  No fevers or chills.  Laryngitis- sxs started yesterday.  Developed productive cough- 'green thick yuck'.  No fevers.  + sinus pressure.  + HA.  Negative COVID test  Review of Systems For ROS see HPI     Objective:   Physical Exam Vitals reviewed.  Constitutional:      General: She is not in acute distress.    Appearance: Normal appearance. She is well-developed. She is not ill-appearing.  HENT:     Head: Normocephalic and atraumatic.     Right Ear: Tympanic membrane normal.     Left Ear: Tympanic membrane normal.     Nose: Mucosal edema and rhinorrhea present.     Right Sinus: Maxillary sinus tenderness and frontal sinus tenderness present.     Left Sinus: Maxillary sinus tenderness and frontal sinus tenderness present.     Mouth/Throat:     Pharynx: Uvula midline. Posterior oropharyngeal erythema present. No oropharyngeal exudate.  Eyes:     Conjunctiva/sclera: Conjunctivae normal.     Pupils: Pupils are equal, round, and reactive to light.  Cardiovascular:     Rate and Rhythm: Normal rate and regular rhythm.     Heart sounds: Normal heart sounds.  Pulmonary:     Effort: Pulmonary effort is normal. No respiratory distress.     Breath sounds: Normal breath sounds. No wheezing.  Abdominal:     General: There is no distension.     Palpations: Abdomen is soft.     Tenderness: There is no abdominal tenderness. There is no right CVA tenderness, left CVA tenderness, guarding or rebound.  Musculoskeletal:     Cervical back: Normal range of motion and neck supple.  Lymphadenopathy:     Cervical: No cervical adenopathy.  Skin:    General: Skin is warm and dry.  Neurological:     General: No focal  deficit present.     Mental Status: She is alert and oriented to person, place, and time.     Cranial Nerves: No cranial nerve deficit.     Motor: No weakness.     Coordination: Coordination normal.  Psychiatric:        Mood and Affect: Mood normal.        Behavior: Behavior normal.        Thought Content: Thought content normal.           Assessment & Plan:  Dysuria- new.  Pt's sxs and UA consistent w/ infxn.  Start Keflex TID to tx both UTI and likely sinusitis.  Reviewed supportive care and red flags that should prompt return.  Pt expressed understanding and is in agreement w/ plan.   Bacterial sinusitis- new.  Pt's sxs and PE consistent w/ dx.  Start Keflex TID x 7 days.  Reviewed supportive care and red flags that should prompt return.  Pt expressed understanding and is in agreement w/ plan.

## 2022-04-29 ENCOUNTER — Ambulatory Visit: Payer: Medicare Other | Admitting: Orthopedic Surgery

## 2022-04-29 ENCOUNTER — Ambulatory Visit (INDEPENDENT_AMBULATORY_CARE_PROVIDER_SITE_OTHER): Payer: Medicare Other

## 2022-04-29 ENCOUNTER — Encounter: Payer: Self-pay | Admitting: Orthopedic Surgery

## 2022-04-29 DIAGNOSIS — M76822 Posterior tibial tendinitis, left leg: Secondary | ICD-10-CM | POA: Diagnosis not present

## 2022-04-29 DIAGNOSIS — M25572 Pain in left ankle and joints of left foot: Secondary | ICD-10-CM | POA: Diagnosis not present
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
## 2022-04-30 ENCOUNTER — Encounter: Payer: Self-pay | Admitting: Family Medicine

## 2022-04-30 MED ORDER — PROMETHAZINE-DM 6.25-15 MG/5ML PO SYRP: 5.0000 mL | ORAL_SOLUTION | Freq: Four times a day (QID) | ORAL | 0 refills | Status: DC | PRN

## 2022-05-01 ENCOUNTER — Telehealth: Payer: Self-pay

## 2022-05-01 LAB — URINE CULTURE
MICRO NUMBER:: 14148932
SPECIMEN QUALITY:: ADEQUATE

## 2022-05-01 NOTE — Telephone Encounter (Signed)
-----   Message from Sheliah Hatch, MD sent at 05/01/2022  3:20 PM EST ----- Your UTI is being treated appropriately- great news!  I hope you are feeling better!

## 2022-05-01 NOTE — Telephone Encounter (Signed)
Informed pt of lab results . She states she is feeling much better

## 2022-05-07 ENCOUNTER — Ambulatory Visit (INDEPENDENT_AMBULATORY_CARE_PROVIDER_SITE_OTHER): Payer: Medicare Other | Admitting: *Deleted

## 2022-05-07 ENCOUNTER — Other Ambulatory Visit: Payer: Self-pay

## 2022-05-07 DIAGNOSIS — Z0000 Encounter for general adult medical examination without abnormal findings: Secondary | ICD-10-CM | POA: Diagnosis not present

## 2022-05-07 MED ORDER — ROPINIROLE HCL 3 MG PO TABS: 3.0000 mg | ORAL_TABLET | Freq: Every day | ORAL | 1 refills | Status: DC
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
## 2022-05-07 NOTE — Patient Instructions (Signed)
Ms. Melissa Jordan , Thank you for taking time to come for your Medicare Wellness Visit. I appreciate your ongoing commitment to your health goals. Please review the following plan we discussed and let me know if I can assist you in the future.   These are the goals we discussed:  Goals      Exercise 3x per week (30 min per time)     Get back to walking. Will increase walking as tolerated.      Patient Stated     Like stay active     PharmD Care Plan     CARE PLAN ENTRY (see longitudinal plan of care for additional care plan information)  Current Barriers:  Chronic Disease Management support, education, and care coordination needs related to Hypertension, Hyperlipidemia, and Osteopenia   Hypertension BP Readings from Last 3 Encounters:  05/23/20 118/62  04/12/20 125/65  03/22/20 (!) 143/69  Pharmacist Clinical Goal(s): Over the next 365 days, patient will work with PharmD and providers to maintain BP goal <130/80 Current regimen:  Losartan 50 mg once daily Metoprolol tartrate 25 mg twice daily (rate control in atrial fibrillation, some BP-lowering) Interventions: Diet and exercise discussed - Maintain a healthy weight and exercise regularly, as directed by your health care provider. Eat healthy foods, such as: Lean proteins, complex carbohydrates, fresh fruits and vegetables, low-fat dairy products, healthy fats. Patient self care activities - Over the next 365 days, patient will: Check BP at least once every 1-2 weeks, document, and provide at future appointments Ensure daily salt intake < 2300 mg/day  Hyperlipidemia Lab Results  Component Value Date/Time   LDLCALC 100 (H) 01/31/2020 09:27 AM   LDLDIRECT 133.8 03/29/2012 11:10 AM  Pharmacist Clinical Goal(s): Over the next 365 days, patient will work with PharmD and providers to achieve LDL goal < 100 Current regimen:  N/a Interventions: Diet/exercise discussed per HTN Patient self care activities - Over the next 365 days,  patient will: Continue current management  Stroke prevention in atrial fibrillation Pharmacist Clinical Goal(s) Over the next 90 days, patient will work with PharmD and providers to improve medication accessibility and ensure medication safety Current regimen:  Eliquis 5 mg twice daily Interventions: Reviewed patient assistance - agree to pursue for Eliquis Reviewed side effects - none noted Patient self care activities - Over the next 90 days, patient will: Complete patient section of patient assistance application, provided any required financial information and drop off with cardiology for sign off.   Medication management Pharmacist Clinical Goal(s): Over the next 365 days, patient will work with PharmD and providers to maintain optimal medication adherence Current pharmacy: [facility] Interventions Comprehensive medication review performed. Continue current medication management strategy Patient self care activities - Over the next 365 days, patient will: Take medications as prescribed Report any questions or concerns to PharmD and/or provider(s) Initial goal documentation.        This is a list of the screening recommended for you and due dates:  Health Maintenance  Topic Date Due   Flu Shot  09/21/2022*   Mammogram  06/11/2022   Medicare Annual Wellness Visit  05/08/2023   Tetanus Vaccine  03/24/2032   DEXA scan (bone density measurement)  Completed   HPV Vaccine  Aged Out   Pneumonia Vaccine  Discontinued   Colon Cancer Screening  Discontinued   COVID-19 Vaccine  Discontinued   Hepatitis C Screening: USPSTF Recommendation to screen - Ages 7-79 yo.  Discontinued   Zoster (Shingles) Vaccine  Discontinued  *Topic was  postponed. The date shown is not the original due date.    Advanced directives: Education provided  Conditions/risks identified:      Preventive Care 76 Years and Older, Female Preventive care refers to lifestyle choices and visits with your  health care provider that can promote health and wellness. What does preventive care include? A yearly physical exam. This is also called an annual well check. Dental exams once or twice a year. Routine eye exams. Ask your health care provider how often you should have your eyes checked. Personal lifestyle choices, including: Daily care of your teeth and gums. Regular physical activity. Eating a healthy diet. Avoiding tobacco and drug use. Limiting alcohol use. Practicing safe sex. Taking low-dose aspirin every day. Taking vitamin and mineral supplements as recommended by your health care provider. What happens during an annual well check? The services and screenings done by your health care provider during your annual well check will depend on your age, overall health, lifestyle risk factors, and family history of disease. Counseling  Your health care provider may ask you questions about your: Alcohol use. Tobacco use. Drug use. Emotional well-being. Home and relationship well-being. Sexual activity. Eating habits. History of falls. Memory and ability to understand (cognition). Work and work Astronomer. Reproductive health. Screening  You may have the following tests or measurements: Height, weight, and BMI. Blood pressure. Lipid and cholesterol levels. These may be checked every 5 years, or more frequently if you are over 15 years old. Skin check. Lung cancer screening. You may have this screening every year starting at age 32 if you have a 30-pack-year history of smoking and currently smoke or have quit within the past 15 years. Fecal occult blood test (FOBT) of the stool. You may have this test every year starting at age 69. Flexible sigmoidoscopy or colonoscopy. You may have a sigmoidoscopy every 5 years or a colonoscopy every 10 years starting at age 22. Hepatitis C blood test. Hepatitis B blood test. Sexually transmitted disease (STD) testing. Diabetes screening. This  is done by checking your blood sugar (glucose) after you have not eaten for a while (fasting). You may have this done every 1-3 years. Bone density scan. This is done to screen for osteoporosis. You may have this done starting at age 69. Mammogram. This may be done every 1-2 years. Talk to your health care provider about how often you should have regular mammograms. Talk with your health care provider about your test results, treatment options, and if necessary, the need for more tests. Vaccines  Your health care provider may recommend certain vaccines, such as: Influenza vaccine. This is recommended every year. Tetanus, diphtheria, and acellular pertussis (Tdap, Td) vaccine. You may need a Td booster every 10 years. Zoster vaccine. You may need this after age 38. Pneumococcal 13-valent conjugate (PCV13) vaccine. One dose is recommended after age 58. Pneumococcal polysaccharide (PPSV23) vaccine. One dose is recommended after age 6. Talk to your health care provider about which screenings and vaccines you need and how often you need them. This information is not intended to replace advice given to you by your health care provider. Make sure you discuss any questions you have with your health care provider. Document Released: 07/06/2015 Document Revised: 02/27/2016 Document Reviewed: 04/10/2015 Elsevier Interactive Patient Education  2017 [employer].  Fall Prevention in the Home Falls can cause injuries. They can happen to people of all ages. There are many things you can do to make your home safe and to help  prevent falls. What can I do on the outside of my home? Regularly fix the edges of walkways and driveways and fix any cracks. Remove anything that might make you trip as you walk through a door, such as a raised step or threshold. Trim any bushes or trees on the path to your home. Use bright outdoor lighting. Clear any walking paths of anything that might make someone trip, such as  rocks or tools. Regularly check to see if handrails are loose or broken. Make sure that both sides of any steps have handrails. Any raised decks and porches should have guardrails on the edges. Have any leaves, snow, or ice cleared regularly. Use sand or salt on walking paths during winter. Clean up any spills in your garage right away. This includes oil or grease spills. What can I do in the bathroom? Use night lights. Install grab bars by the toilet and in the tub and shower. Do not use towel bars as grab bars. Use non-skid mats or decals in the tub or shower. If you need to sit down in the shower, use a plastic, non-slip stool. Keep the floor dry. Clean up any water that spills on the floor as soon as it happens. Remove soap buildup in the tub or shower regularly. Attach bath mats securely with double-sided non-slip rug tape. Do not have throw rugs and other things on the floor that can make you trip. What can I do in the bedroom? Use night lights. Make sure that you have a light by your bed that is easy to reach. Do not use any sheets or blankets that are too big for your bed. They should not hang down onto the floor. Have a firm chair that has side arms. You can use this for support while you get dressed. Do not have throw rugs and other things on the floor that can make you trip. What can I do in the kitchen? Clean up any spills right away. Avoid walking on wet floors. Keep items that you use a lot in easy-to-reach places. If you need to reach something above you, use a strong step stool that has a grab bar. Keep electrical cords out of the way. Do not use floor polish or wax that makes floors slippery. If you must use wax, use non-skid floor wax. Do not have throw rugs and other things on the floor that can make you trip. What can I do with my stairs? Do not leave any items on the stairs. Make sure that there are handrails on both sides of the stairs and use them. Fix handrails  that are broken or loose. Make sure that handrails are as long as the stairways. Check any carpeting to make sure that it is firmly attached to the stairs. Fix any carpet that is loose or worn. Avoid having throw rugs at the top or bottom of the stairs. If you do have throw rugs, attach them to the floor with carpet tape. Make sure that you have a light switch at the top of the stairs and the bottom of the stairs. If you do not have them, ask someone to add them for you. What else can I do to help prevent falls? Wear shoes that: Do not have high heels. Have rubber bottoms. Are comfortable and fit you well. Are closed at the toe. Do not wear sandals. If you use a stepladder: Make sure that it is fully opened. Do not climb a closed stepladder. Make sure that  both sides of the stepladder are locked into place. Ask someone to hold it for you, if possible. Clearly mark and make sure that you can see: Any grab bars or handrails. First and last steps. Where the edge of each step is. Use tools that help you move around (mobility aids) if they are needed. These include: Canes. Walkers. Scooters. Crutches. Turn on the lights when you go into a dark area. Replace any light bulbs as soon as they burn out. Set up your furniture so you have a clear path. Avoid moving your furniture around. If any of your floors are uneven, fix them. If there are any pets around you, be aware of where they are. Review your medicines with your doctor. Some medicines can make you feel dizzy. This can increase your chance of falling. Ask your doctor what other things that you can do to help prevent falls. This information is not intended to replace advice given to you by your health care provider. Make sure you discuss any questions you have with your health care provider. Document Released: 04/05/2009 Document Revised: 11/15/2015 Document Reviewed: 07/14/2014 Elsevier Interactive Patient Education  2017 [employer]
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
## 2022-05-08 ENCOUNTER — Encounter: Payer: Self-pay | Admitting: Family Medicine

## 2022-05-08 MED ORDER — DOXYCYCLINE HYCLATE 100 MG PO TABS: 100.0000 mg | ORAL_TABLET | Freq: Two times a day (BID) | ORAL | 0 refills | Status: DC

## 2022-05-09 ENCOUNTER — Ambulatory Visit: Payer: Medicare Other | Attending: Cardiology | Admitting: Physician Assistant

## 2022-05-09 VITALS — BP 106/62 | HR 57 | Ht 63.0 in | Wt 147.6 lb

## 2022-05-09 DIAGNOSIS — I10 Essential (primary) hypertension: Secondary | ICD-10-CM

## 2022-05-09 DIAGNOSIS — Z95818 Presence of other cardiac implants and grafts: Secondary | ICD-10-CM | POA: Diagnosis not present

## 2022-05-09 DIAGNOSIS — I4819 Other persistent atrial fibrillation: Secondary | ICD-10-CM | POA: Diagnosis not present

## 2022-05-09 MED ORDER — CLOPIDOGREL BISULFATE 75 MG PO TABS: 75.0000 mg | ORAL_TABLET | Freq: Every day | ORAL | 1 refills | Status: DC

## 2022-05-09 NOTE — Patient Instructions (Signed)
Medication Instructions:  Your physician has recommended you make the following change in your medication:  Stop Eliquis. Start Clopidogrel 75 mg by mouth daily   *If you need a refill on your cardiac medications before your next appointment, please call your pharmacy*   Lab Work: Lab work to be done today--CBC and BMP If you have labs (blood work) drawn today and your tests are completely normal, you will receive your results only by: MyChart Message (if you have MyChart) OR A paper copy in the mail If you have any lab test that is abnormal or we need to change your treatment, we will call you to review the results.   Testing/Procedures: See CT instruction sheet   Follow-Up: At [facility], you and your health needs are our priority.  As part of our continuing mission to provide you with exceptional heart care, we have created designated Provider Care Teams.  These Care Teams include your primary Cardiologist (physician) and Advanced Practice Providers (APPs -  Physician Assistants and Nurse Practitioners) who all work together to provide you with the care you need, when you need it.  We recommend signing up for the patient portal called "MyChart".  Sign up information is provided on this After Visit Summary.  MyChart is used to connect with patients for Virtual Visits (Telemedicine).  Patients are able to view lab/test results, encounter notes, upcoming appointments, etc.  Non-urgent messages can be sent to your provider as well.   To learn more about what you can do with MyChart, go to [email or web address].    Your next appointment:   September 22, 2022  The format for your next appointment:   In Person  Provider:   Georgie Chard, NP    Other Instructions    Important Information About Sugar

## 2022-05-10 LAB — CBC
Hematocrit: 39.8 % (ref 34.0–46.6)
Hemoglobin: 13 g/dL (ref 11.1–15.9)
MCH: 29.4 pg (ref 26.6–33.0)
MCHC: 32.7 g/dL (ref 31.5–35.7)
MCV: 90 fL (ref 79–97)
Platelets: 207 10*3/uL (ref 150–450)
RBC: 4.42 x10E6/uL (ref 3.77–5.28)
RDW: 12.4 % (ref 11.7–15.4)
WBC: 9.6 10*3/uL (ref 3.4–10.8)

## 2022-05-10 LAB — BASIC METABOLIC PANEL WITH GFR
BUN/Creatinine Ratio: 21 (ref 12–28)
BUN: 20 mg/dL (ref 8–27)
CO2: 28 mmol/L (ref 20–29)
Calcium: 9.2 mg/dL (ref 8.7–10.3)
Chloride: 104 mmol/L (ref 96–106)
Creatinine, Ser: 0.95 mg/dL (ref 0.57–1.00)
Glucose: 82 mg/dL (ref 70–99)
Potassium: 4.8 mmol/L (ref 3.5–5.2)
Sodium: 144 mmol/L (ref 134–144)
eGFR: 62 mL/min/{1.73_m2}

## 2022-05-19 ENCOUNTER — Other Ambulatory Visit: Payer: Self-pay | Admitting: Orthopedic Surgery

## 2022-05-19 ENCOUNTER — Encounter: Payer: Self-pay | Admitting: Family Medicine

## 2022-05-19 DIAGNOSIS — R052 Subacute cough: Secondary | ICD-10-CM

## 2022-05-19 DIAGNOSIS — M76822 Posterior tibial tendinitis, left leg: Secondary | ICD-10-CM

## 2022-05-20 ENCOUNTER — Encounter: Payer: Self-pay | Admitting: Orthopedic Surgery

## 2022-05-20 ENCOUNTER — Other Ambulatory Visit: Payer: Self-pay | Admitting: Family Medicine

## 2022-05-20 MED ORDER — ALBUTEROL SULFATE HFA 108 (90 BASE) MCG/ACT IN AERS: 2.0000 | INHALATION_SPRAY | Freq: Four times a day (QID) | RESPIRATORY_TRACT | 0 refills | Status: DC | PRN

## 2022-05-20 NOTE — Telephone Encounter (Signed)
[facility] VISIT   Patient agreed to [facility] visit and is aware that copayment and coinsurance may apply. Patient was treated using telemedicine according to accepted telemedicine protocols.  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   Traumatic arthritis of right ankle    Pain in right ankle and joints of right foot 08/05/2018   A-fib (HCC) 03/10/2017   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/2016   RLS (restless legs syndrome) 01/11/2015   Osteopenia 05/15/2014   Hyperlipidemia 05/24/2013   Routine general medical examination at a health care facility 03/29/2012   POSTMENOPAUSAL SYNDROME 09/26/2009   URINARY URGENCY 09/26/2009   ANEMIA 01/09/2009   Back pain of lumbar region with sciatica 01/09/2009   LAMINECTOMY, LUMBAR, HX OF 08/09/2008   Hypothyroid 03/03/2007   HYPERTENSION, BENIGN 03/03/2007   GERD 02/10/2007   Social History   Tobacco Use   Smoking status: Never   Smokeless tobacco: Never  Substance Use Topics   Alcohol use: No    Current Outpatient Medications:    albuterol (VENTOLIN HFA) 108 (90 Base) MCG/ACT inhaler, Inhale 2 puffs into the lungs every 6 (six) hours as needed for wheezing or shortness of breath., Disp: 8 g, Rfl: 0   acetaminophen (TYLENOL) 500 MG tablet, Take 1,000 mg by mouth every 6 (six) hours as needed for moderate pain. , Disp: , Rfl:    azelastine (ASTELIN) 0.1 % nasal spray, Place 1 spray into both  nostrils 2 (two) times daily. Use in each nostril as directed (Patient taking differently: Place 1 spray into both nostrils 2 (two) times daily as needed (nasal congestion.). Use in each nostril as directed), Disp: 30 mL, Rfl: 12   CALCIUM-VITAMIN D PO, Take 1 tablet by mouth at bedtime., Disp: , Rfl:    Cholecalciferol (VITAMIN D) 125 MCG (5000 UT) CAPS, Take 5,000 Units by mouth in the morning., Disp: , Rfl:    clopidogrel (PLAVIX) 75 MG tablet, Take 1 tablet (75 mg total) by mouth daily., Disp: 90 tablet, Rfl: 1   doxycycline (VIBRA-TABS) 100 MG tablet, Take 1 tablet (100 mg total) by mouth 2 (two) times daily., Disp: 20 tablet, Rfl: 0   estradiol (ESTRACE) 1 MG tablet, TAKE 1 TABLET BY MOUTH  DAILY, Disp: 100 tablet, Rfl: 2   levothyroxine (SYNTHROID) 25 MCG tablet, Take 1 tablet (25 mcg total) by mouth daily. (Patient taking differently: Take 25 mcg by mouth See admin instructions. Take 1 tablet (25 mcg) by mouth on Mondays- Fridays. Take 2 tablets (50 mcg) by mouth on Saturdays & Sundays.), Disp: 30 tablet, Rfl: 3   loratadine (CLARITIN) 10 MG tablet, Take 10 mg by mouth in the morning., Disp: , Rfl:    magnesium oxide (MAG-OX) 400 MG tablet, Take 400 mg by mouth at bedtime., Disp: , Rfl:    methocarbamol (ROBAXIN) 750 MG tablet, Take 750 mg by mouth daily as needed for muscle spasms., Disp: , Rfl:    metoprolol tartrate (LOPRESSOR) 25 MG tablet, TAKE 1 TABLET BY  MOUTH TWICE  DAILY, Disp: 200 tablet, Rfl: 2   Polyethyl Glycol-Propyl Glycol (SYSTANE) 0.4-0.3 % SOLN, Place 1 drop into both eyes daily., Disp: , Rfl:    promethazine-dextromethorphan (PROMETHAZINE-DM) 6.25-15 MG/5ML syrup, Take 5 mLs by mouth 4 (four) times daily as needed., Disp: 240 mL, Rfl: 0   rOPINIRole (REQUIP) 3 MG tablet, Take 1 tablet (3 mg total) by mouth at bedtime., Disp: 90 tablet, Rfl: 1   traMADol (ULTRAM) 50 MG tablet, Take 50 mg by mouth every 6 (six) hours as needed for pain., Disp: , Rfl:    triamcinolone ointment  (KENALOG) 0.1 %, Apply 1 Application topically 2 (two) times daily. (Patient taking differently: Apply 1 Application topically 2 (two) times daily as needed (skin irritation/rash--allergic reaction.).), Disp: 90 g, Rfl: 1  Allergies  Allergen Reactions   Lyrica [Pregabalin] Palpitations and Other (See Comments)    "thought I was going to have a heart attack; heart started pounding so hard, I couldn't catch my breath" (06/28/2012)   Meclizine Other (See Comments)    "what was in my mind to say wasn't what came out to say; I was kind of in another world" (06/28/2012)   Penicillins Itching and Rash    Has patient had a PCN reaction causing immediate rash, facial/tongue/throat swelling, SOB or lightheadedness with hypotension: No Has patient had a PCN reaction causing severe rash involving mucus membranes or skin necrosis: No Has patient had a PCN reaction that required hospitalization No Has patient had a PCN reaction occurring within the last 10 years:   # # YES # #  If all of the above answers are "NO", then may proceed with Cephalosporin use.  Has tolerated cefazolin (2020, 2023)    Poison Sumac Extract Swelling, Rash and Other (See Comments)    Blisters   Propoxyphene Nausea And Vomiting   Bactrim [Sulfamethoxazole-Trimethoprim] Rash   Codeine Nausea Only    Reaction only in pill form   Morphine Sulfate Rash   Propoxyphene N-Acetaminophen Nausea And Vomiting    "Darvocet"   Vancomycin Rash    Assessment and Plan:   Diagnosis: cough. Please see myChart communication and orders below.   No orders of the defined types were placed in this encounter.  Meds ordered this encounter  Medications   albuterol (VENTOLIN HFA) 108 (90 Base) MCG/ACT inhaler    Sig: Inhale 2 puffs into the lungs every 6 (six) hours as needed for wheezing or shortness of breath.    Dispense:  8 g    Refill:  0    Neena Rhymes, MD 05/20/2022  A total of 5 minutes were spent by me to personally review the  patient-generated inquiry, review patient records and data pertinent to assessment of the patient's problem, develop a management plan including generation of prescriptions and/or orders, and on subsequent communication with the patient through secure the MyChart portal service.   There is no separately reported E/M service related to this service in the past 7 days nor does the patient have an upcoming soonest available appointment for this issue. This work was completed in less than 7 days.   The patient consented to this service today (see patient agreement prior to ongoing communication). Patient counseled regarding the need for in-person exam for certain conditions and was advised to call the office if any changing or worsening symptoms occur.   The codes to be used for the E/M service are: []   99421 for 5-10 minutes of time spent on the inquiry. []   86754 for 11-20 minutes. []   for 21+ minutes.

## 2022-05-21 DIAGNOSIS — M5416 Radiculopathy, lumbar region: Secondary | ICD-10-CM | POA: Diagnosis not present

## 2022-05-23 ENCOUNTER — Telehealth (HOSPITAL_COMMUNITY): Payer: Self-pay | Admitting: Emergency Medicine

## 2022-05-23 ENCOUNTER — Other Ambulatory Visit: Payer: Self-pay

## 2022-05-23 MED ORDER — PROMETHAZINE-DM 6.25-15 MG/5ML PO SYRP: 5.0000 mL | ORAL_SOLUTION | Freq: Four times a day (QID) | ORAL | 0 refills | Status: DC | PRN

## 2022-05-23 NOTE — Telephone Encounter (Signed)
Reaching out to patient to offer assistance regarding upcoming cardiac imaging study; pt verbalizes understanding of appt date/time, parking situation and where to check in, pre-test NPO status and medications ordered, and verified current allergies; name and call back number provided for further questions should they arise Rockwell Alexandria RN Navigator Cardiac Imaging [facility] [phone] office [phone] cell  Arrival 1030 Easily bruised  Normal meds

## 2022-05-26 ENCOUNTER — Ambulatory Visit (HOSPITAL_COMMUNITY)
Admission: RE | Admit: 2022-05-26 | Discharge: 2022-05-26 | Disposition: A | Discharge status: Home / Self Care | Payer: Medicare Other | Source: Ambulatory Visit | Attending: Cardiology | Admitting: Cardiology

## 2022-05-26 ENCOUNTER — Ambulatory Visit
Admission: RE | Admit: 2022-05-26 | Discharge: 2022-05-26 | Disposition: A | Discharge status: Home / Self Care | Payer: Medicare Other | Source: Ambulatory Visit | Attending: Orthopedic Surgery | Admitting: Orthopedic Surgery

## 2022-05-26 ENCOUNTER — Other Ambulatory Visit (HOSPITAL_COMMUNITY): Payer: Medicare Other

## 2022-05-26 DIAGNOSIS — I4819 Other persistent atrial fibrillation: Secondary | ICD-10-CM | POA: Diagnosis not present

## 2022-05-26 DIAGNOSIS — M7989 Other specified soft tissue disorders: Secondary | ICD-10-CM | POA: Diagnosis not present

## 2022-05-26 DIAGNOSIS — M76822 Posterior tibial tendinitis, left leg: Secondary | ICD-10-CM

## 2022-05-26 DIAGNOSIS — Z95818 Presence of other cardiac implants and grafts: Secondary | ICD-10-CM | POA: Insufficient documentation

## 2022-05-26 DIAGNOSIS — R600 Localized edema: Secondary | ICD-10-CM | POA: Diagnosis not present

## 2022-05-26 MED ORDER — IOHEXOL 350 MG/ML SOLN
95.0000 mL | Freq: Once | INTRAVENOUS | Status: AC | PRN
  Administered 2022-05-26: 95 mL via INTRAVENOUS

## 2022-05-27 ENCOUNTER — Other Ambulatory Visit: Payer: Medicare Other

## 2022-06-02 ENCOUNTER — Encounter: Payer: Self-pay | Admitting: Orthopedic Surgery

## 2022-06-02 ENCOUNTER — Ambulatory Visit: Payer: Medicare Other | Admitting: Orthopedic Surgery

## 2022-06-02 DIAGNOSIS — M76822 Posterior tibial tendinitis, left leg: Secondary | ICD-10-CM | POA: Diagnosis not present

## 2022-06-02 DIAGNOSIS — M25572 Pain in left ankle and joints of left foot: Secondary | ICD-10-CM | POA: Diagnosis not present

## 2022-06-02 MED ORDER — LIDOCAINE HCL 1 % IJ SOLN
2.0000 mL | INTRAMUSCULAR | Status: AC | PRN
  Administered 2022-06-02: 2 mL

## 2022-06-02 MED ORDER — METHYLPREDNISOLONE ACETATE 40 MG/ML IJ SUSP
40.0000 mg | INTRAMUSCULAR | Status: AC | PRN
  Administered 2022-06-02: 40 mg via INTRA_ARTICULAR
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
## 2022-06-07 IMAGING — MG MM DIGITAL DIAGNOSTIC UNILAT*R* W/ TOMO W/ CAD
8 series · 9 of 20 positions shown · non-contrast
Comparison: Previous exam(s).

CLINICAL DATA: Patient recalled from screening for right breast
calcifications.

EXAM:
DIGITAL DIAGNOSTIC UNILATERAL RIGHT MAMMOGRAM WITH TOMOSYNTHESIS AND
CAD
TECHNIQUE: Right digital diagnostic mammography and breast tomosynthesis was
performed. The images were evaluated with computer-aided detection.

[R ML]
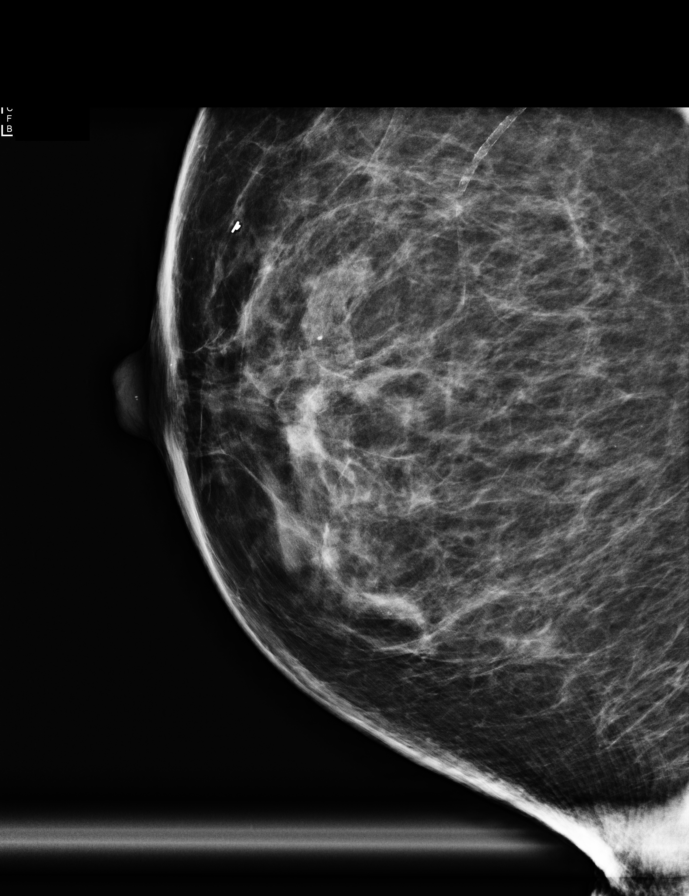

[R CC]
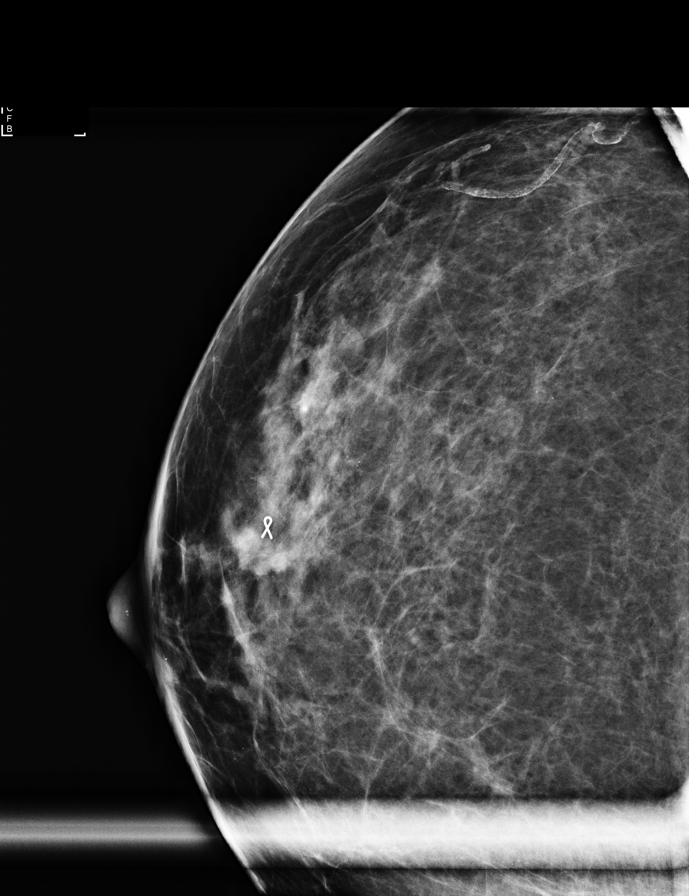

[R ML synth-2D]
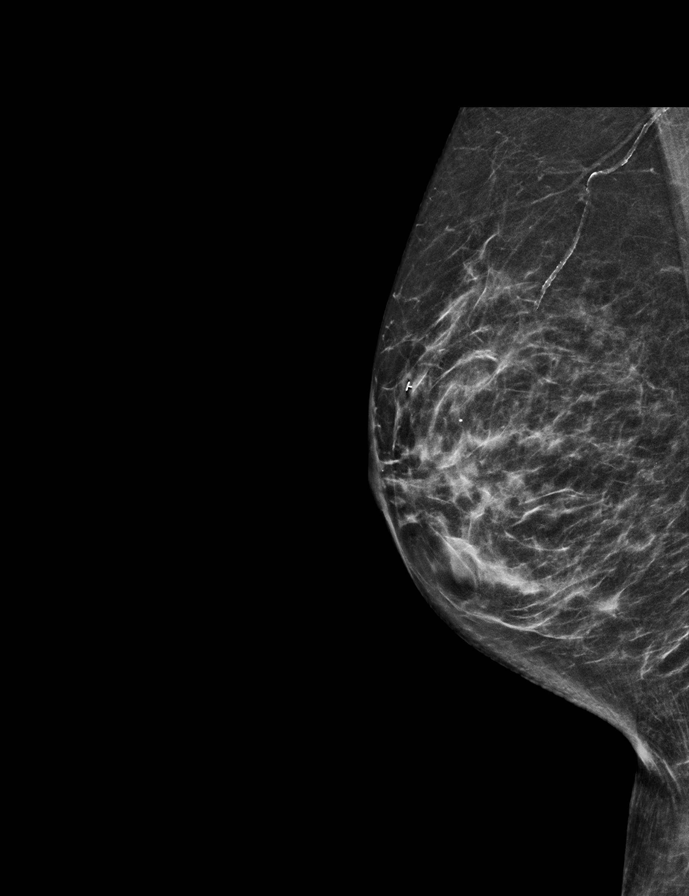

[R CC synth-2D]
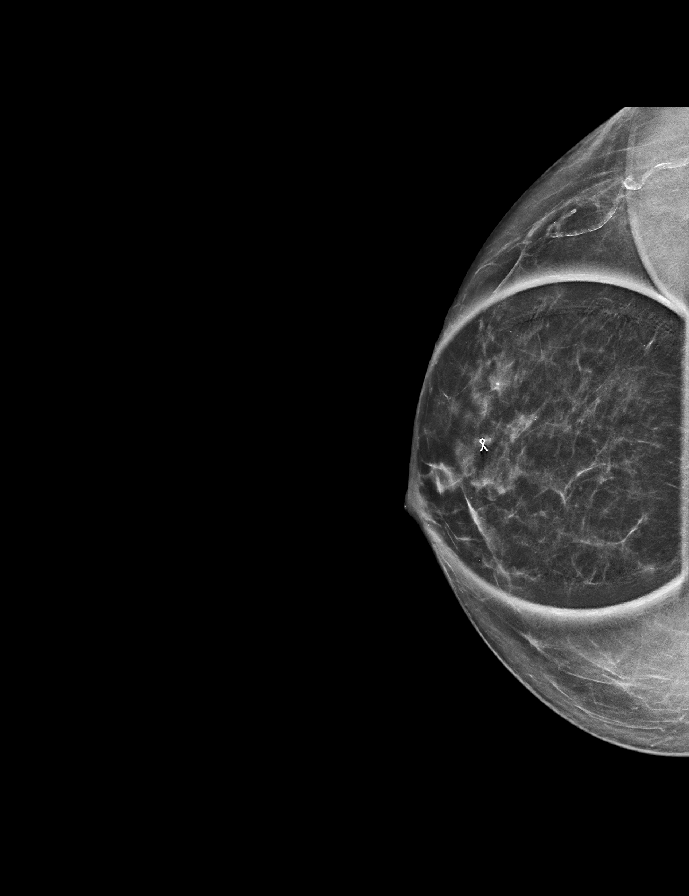

[R MLO synth-2D]
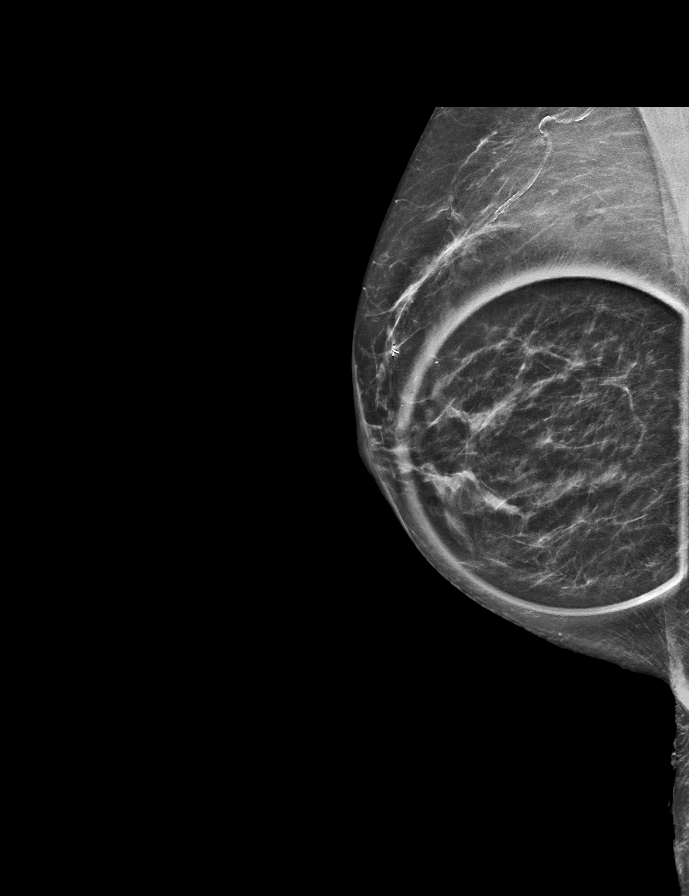

[R CC tomo · 2 of 55 frames shown]
[frame 18/55]
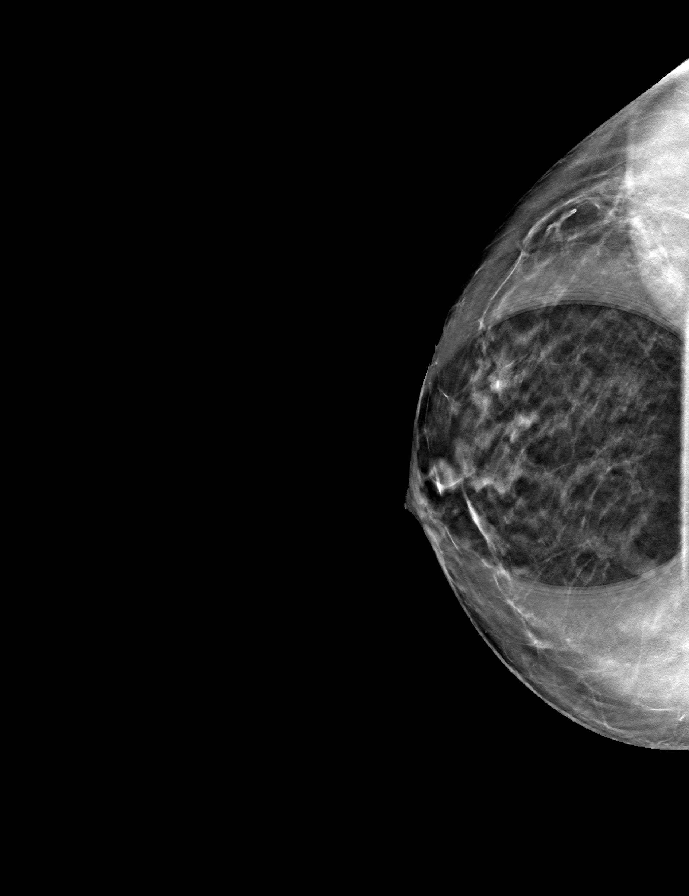
[frame 28/55]
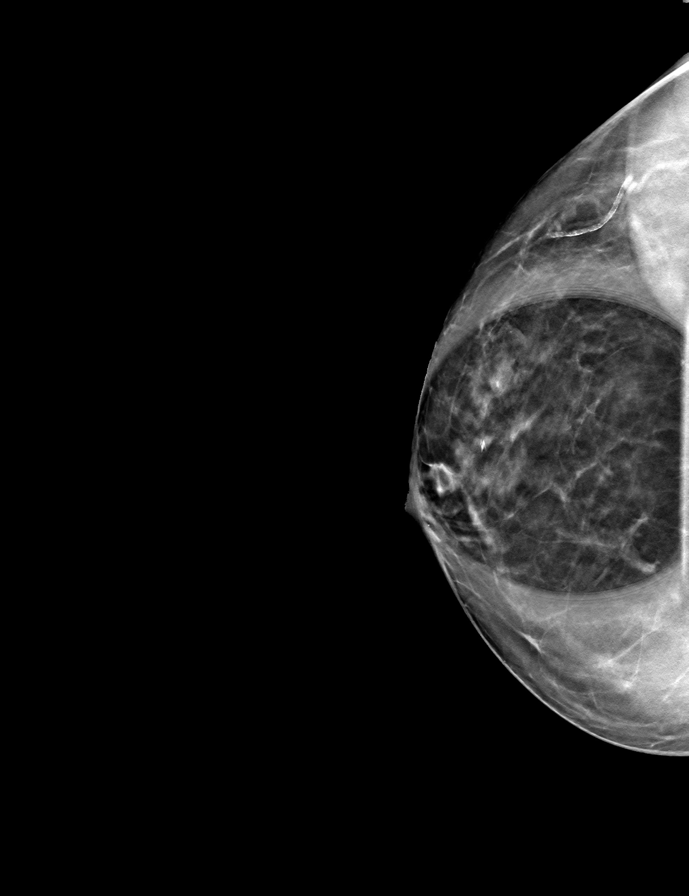

[R MLO tomo · tomo slice 31/61.0]
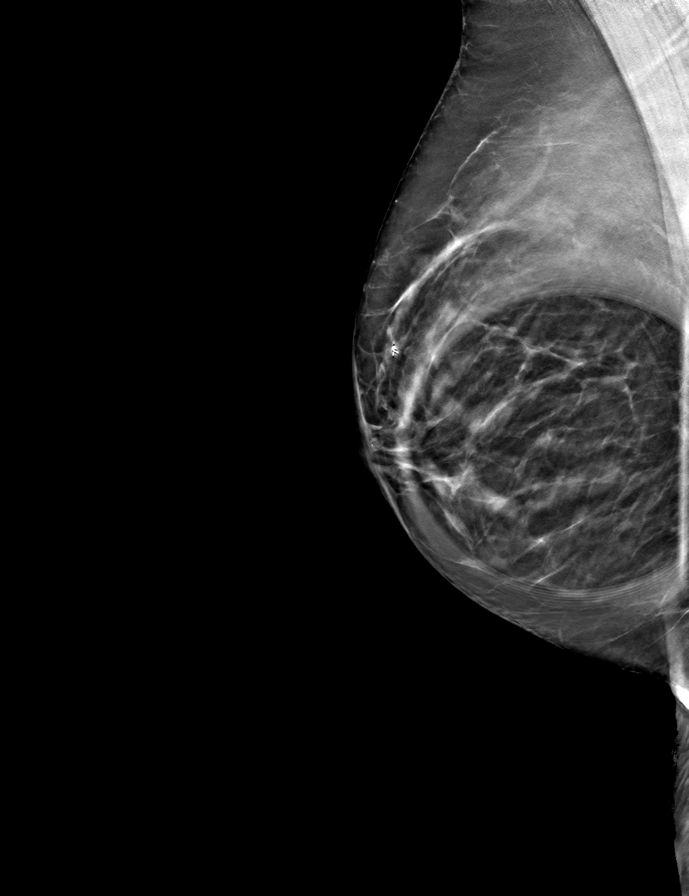

[R ML tomo · tomo slice 29/58.0]
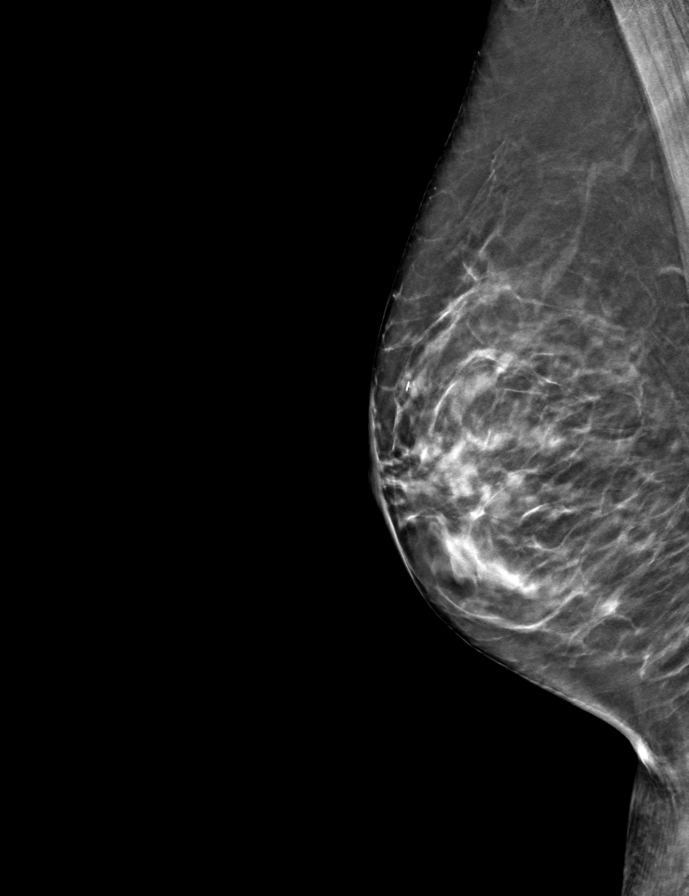

[9 of 20 positions shown; findings below may reference images not displayed]

ACR Breast Density Category c: The breast tissue is heterogeneously
dense, which may obscure small masses.
FINDINGS: Magnification views of the right breast demonstrate two groups of
calcifications measuring 4 mm within the lower outer right breast.
One group is within the anterior depth of the breast and the second
group is within the middle depth of the breast. Questioned asymmetry
within the right breast resolved with additional imaging.
IMPRESSION: Indeterminate right breast calcifications, two groups.

RECOMMENDATION:
Stereotactic guided core needle biopsy of both groups of
indeterminate right breast calcifications.

I have discussed the findings and recommendations with the patient.
If applicable, a reminder letter will be sent to the patient
regarding the next appointment.

BI-RADS CATEGORY  4: Suspicious.

## 2022-06-09 DIAGNOSIS — G894 Chronic pain syndrome: Secondary | ICD-10-CM | POA: Diagnosis not present

## 2022-06-09 DIAGNOSIS — M5416 Radiculopathy, lumbar region: Secondary | ICD-10-CM | POA: Diagnosis not present

## 2022-06-30 ENCOUNTER — Ambulatory Visit: Payer: Medicare Other | Admitting: Orthopedic Surgery

## 2022-06-30 DIAGNOSIS — M76822 Posterior tibial tendinitis, left leg: Secondary | ICD-10-CM | POA: Diagnosis not present

## 2022-06-30 DIAGNOSIS — M5416 Radiculopathy, lumbar region: Secondary | ICD-10-CM | POA: Diagnosis not present

## 2022-07-04 ENCOUNTER — Encounter: Payer: Self-pay | Admitting: Orthopedic Surgery
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
## 2022-07-07 ENCOUNTER — Encounter: Payer: Self-pay | Admitting: Cardiology

## 2022-07-07 ENCOUNTER — Ambulatory Visit: Payer: Medicare Other | Attending: Cardiology | Admitting: Cardiology

## 2022-07-07 VITALS — BP 128/70 | HR 58 | Ht 63.0 in | Wt 150.0 lb

## 2022-07-07 DIAGNOSIS — I480 Paroxysmal atrial fibrillation: Secondary | ICD-10-CM

## 2022-07-07 DIAGNOSIS — I10 Essential (primary) hypertension: Secondary | ICD-10-CM | POA: Diagnosis not present

## 2022-07-07 DIAGNOSIS — Z95818 Presence of other cardiac implants and grafts: Secondary | ICD-10-CM

## 2022-07-07 MED ORDER — METOPROLOL SUCCINATE ER 25 MG PO TB24: 25.0000 mg | ORAL_TABLET | Freq: Every day | ORAL | 3 refills | Status: DC
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
## 2022-07-07 NOTE — Patient Instructions (Addendum)
Medication Instructions:  Your physician has recommended you make the following change in your medication:  STOP Metoprolol Tartrate (Lopressor) START Metoprolol Succinate (Toprol) 25 mg once daily at bedtime  *If you need a refill on your cardiac medications before your next appointment, please call your pharmacy*   Lab Work: None ordered   Testing/Procedures: None ordered   Follow-Up: At [facility], you and your health needs are our priority.  As part of our continuing mission to provide you with exceptional heart care, we have created designated Provider Care Teams.  These Care Teams include your primary Cardiologist (physician) and Advanced Practice Providers (APPs -  Physician Assistants and Nurse Practitioners) who all work together to provide you with the care you need, when you need it.  Your next appointment:   1 year(s)  The format for your next appointment:   In Person  Provider:   You will see one of the following Advanced Practice Providers on your designated Care Team:   Tommye Standard, [place] Legrand Como "Jonni Sanger" Chalmers Cater, [place]    Thank you for choosing [facility]!!    [phone]  Other Instructions

## 2022-07-21 ENCOUNTER — Other Ambulatory Visit: Payer: Self-pay | Admitting: Student

## 2022-07-23 ENCOUNTER — Encounter (HOSPITAL_COMMUNITY): Payer: Self-pay | Admitting: Orthopedic Surgery

## 2022-07-23 ENCOUNTER — Other Ambulatory Visit: Payer: Self-pay

## 2022-07-23 ENCOUNTER — Telehealth: Payer: Self-pay | Admitting: Orthopedic Surgery

## 2022-07-23 NOTE — Progress Notes (Signed)
PCP - Annye Asa, MD Cardiologist - Lars Mage, MD  PPM/ICD - denies  Chest x-ray - n/a EKG - 07/07/22 Stress Test - 10/25/1997 ECHO - 03/20/22 Cardiac Cath - denies  CPAP - n/a  Fasting Blood Sugar - n/a  Blood Thinner Instructions: Plavix - patient was instructed to call MD and ask if she must hold Plavix prior to surgery. Patient verbalized understanding. Patient was instructed: As of today, STOP taking any Aspirin (unless otherwise instructed by your surgeon) Aleve, Naproxen, Ibuprofen, Motrin, Advil, Goody's, BC's, all herbal medications, fish oil, and all vitamins.  ERAS Protcol - yes, until 04;30 o'clock  COVID TEST- n/a  Anesthesia review: yes - cardiac history.   Patient verbally denies any shortness of breath, fever, cough and chest pain during phone call   -------------  SDW INSTRUCTIONS given:  Your procedure is scheduled on Friday, February 2nd, 2024.  Report to [facility] Main Entrance "A" at 05:30 A.M., and check in at the Admitting office.  Call this number if you have problems the morning of surgery:  [phone]   Remember:  Do not eat after midnight the night before your surgery  You may drink clear liquids until 04:30 the morning of your surgery.   Clear liquids allowed are: Water, Non-Citrus Juices (without pulp), Carbonated Beverages, Clear Tea, Black Coffee Only, and Gatorade    Take these medicines the morning of surgery with A SIP OF WATER: Synthroid, Claritin, eye drops PRN: Tylenol, Tramadol, Nasal spray   The day of surgery:                     Do not wear jewelry, make up, or nail polish            Do not wear lotions, powders, perfumes, or deodorant.            Do not shave 48 hours prior to surgery.              Do not bring valuables to the hospital.            [facility] is not responsible for any belongings or valuables.  Do NOT Smoke (Tobacco/Vaping) 24 hours prior to your procedure If you use a CPAP at night, you  may bring all equipment for your overnight stay.   Contacts, glasses, dentures or bridgework may not be worn into surgery.      For patients admitted to the hospital, discharge time will be determined by your treatment team.   Patients discharged the day of surgery will not be allowed to drive home, and someone needs to stay with them for 24 hours.    Special instructions:   - Preparing For Surgery  Before surgery, you can play an important role. Because skin is not sterile, your skin needs to be as free of germs as possible. You can reduce the number of germs on your skin by washing with CHG (chlorahexidine gluconate) Soap before surgery.  CHG is an antiseptic cleaner which kills germs and bonds with the skin to continue killing germs even after washing.    Oral Hygiene is also important to reduce your risk of infection.  Remember - BRUSH YOUR TEETH THE MORNING OF SURGERY WITH YOUR REGULAR TOOTHPASTE  Please do not use if you have an allergy to CHG or antibacterial soaps. If your skin becomes reddened/irritated stop using the CHG.  Do not shave (including legs and underarms) for at least 48 hours prior to first  CHG shower. It is OK to shave your face.  Please follow these instructions carefully.   Shower the NIGHT BEFORE SURGERY and the MORNING OF SURGERY with DIAL Soap.   Pat yourself dry with a CLEAN TOWEL.  Wear CLEAN PAJAMAS to bed the night before surgery  Place CLEAN SHEETS on your bed the night of your first shower and DO NOT SLEEP WITH PETS.   Day of Surgery: Please shower morning of surgery  Wear Clean/Comfortable clothing the morning of surgery Do not apply any deodorants/lotions.   Remember to brush your teeth WITH YOUR REGULAR TOOTHPASTE.   Questions were answered. Patient verbalized understanding of instructions.

## 2022-07-23 NOTE — Telephone Encounter (Signed)
Pt called stating pre admission for her surgery need Dr Sharol Given to requesting pt to stop taking Plavix from Dr Mardene Speak office and send in to hospital. Please call pt about this matter. Pt phone number is [phone].

## 2022-07-24 ENCOUNTER — Encounter (HOSPITAL_COMMUNITY): Payer: Self-pay | Admitting: Orthopedic Surgery

## 2022-07-24 NOTE — Telephone Encounter (Signed)
Dr. Sharol Given addressed through secure chat with nursing.
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
## 2022-07-25 ENCOUNTER — Ambulatory Visit (HOSPITAL_COMMUNITY): Payer: Medicare Other | Admitting: Vascular Surgery

## 2022-07-25 ENCOUNTER — Encounter (HOSPITAL_COMMUNITY)
Admission: RE | Disposition: A | Discharge status: Home / Self Care | Payer: Self-pay | Source: Home / Self Care | Attending: Orthopedic Surgery

## 2022-07-25 ENCOUNTER — Other Ambulatory Visit: Payer: Self-pay

## 2022-07-25 ENCOUNTER — Ambulatory Visit (HOSPITAL_BASED_OUTPATIENT_CLINIC_OR_DEPARTMENT_OTHER): Payer: Medicare Other | Admitting: Vascular Surgery

## 2022-07-25 ENCOUNTER — Encounter (HOSPITAL_COMMUNITY): Payer: Self-pay | Admitting: Orthopedic Surgery

## 2022-07-25 ENCOUNTER — Ambulatory Visit (HOSPITAL_COMMUNITY): Payer: Medicare Other

## 2022-07-25 ENCOUNTER — Ambulatory Visit (HOSPITAL_COMMUNITY)
Admission: RE | Admit: 2022-07-25 | Discharge: 2022-07-26 | Disposition: A | Discharge status: Home / Self Care | Payer: Medicare Other | Attending: Orthopedic Surgery | Admitting: Orthopedic Surgery

## 2022-07-25 DIAGNOSIS — I480 Paroxysmal atrial fibrillation: Secondary | ICD-10-CM | POA: Diagnosis not present

## 2022-07-25 DIAGNOSIS — D649 Anemia, unspecified: Secondary | ICD-10-CM | POA: Diagnosis not present

## 2022-07-25 DIAGNOSIS — E039 Hypothyroidism, unspecified: Secondary | ICD-10-CM | POA: Diagnosis not present

## 2022-07-25 DIAGNOSIS — D759 Disease of blood and blood-forming organs, unspecified: Secondary | ICD-10-CM | POA: Insufficient documentation

## 2022-07-25 DIAGNOSIS — R112 Nausea with vomiting, unspecified: Secondary | ICD-10-CM | POA: Insufficient documentation

## 2022-07-25 DIAGNOSIS — M76822 Posterior tibial tendinitis, left leg: Secondary | ICD-10-CM

## 2022-07-25 DIAGNOSIS — Z7902 Long term (current) use of antithrombotics/antiplatelets: Secondary | ICD-10-CM | POA: Insufficient documentation

## 2022-07-25 DIAGNOSIS — I10 Essential (primary) hypertension: Secondary | ICD-10-CM | POA: Diagnosis not present

## 2022-07-25 DIAGNOSIS — D638 Anemia in other chronic diseases classified elsewhere: Secondary | ICD-10-CM

## 2022-07-25 DIAGNOSIS — G8918 Other acute postprocedural pain: Secondary | ICD-10-CM | POA: Diagnosis not present

## 2022-07-25 DIAGNOSIS — I4891 Unspecified atrial fibrillation: Secondary | ICD-10-CM

## 2022-07-25 HISTORY — PX: FOOT ARTHRODESIS: SHX1655

## 2022-07-25 LAB — BASIC METABOLIC PANEL
Anion gap: 9 (ref 5–15)
BUN: 20 mg/dL (ref 8–23)
CO2: 23 mmol/L (ref 22–32)
Calcium: 8.6 mg/dL — ABNORMAL LOW (ref 8.9–10.3)
Chloride: 106 mmol/L (ref 98–111)
Creatinine, Ser: 0.97 mg/dL (ref 0.44–1.00)
GFR, Estimated: >60 mL/min (ref >60)
Glucose, Bld: 87 mg/dL (ref 70–99)
Potassium: 3.7 mmol/L (ref 3.5–5.1)
Sodium: 138 mmol/L (ref 135–145)

## 2022-07-25 LAB — CBC
HCT: 38.7 % (ref 36.0–46.0)
Hemoglobin: 12.4 g/dL (ref 12.0–15.0)
MCH: 29.1 pg (ref 26.0–34.0)
MCHC: 32 g/dL (ref 30.0–36.0)
MCV: 90.8 fL (ref 80.0–100.0)
Platelets: 189 10*3/uL (ref 150–400)
RBC: 4.26 MIL/uL (ref 3.87–5.11)
RDW: 14 % (ref 11.5–15.5)
WBC: 7.8 10*3/uL (ref 4.0–10.5)
nRBC: 0 % (ref 0.0–0.2)

## 2022-07-25 LAB — SURGICAL PCR SCREEN
MRSA, PCR: NEGATIVE
Staphylococcus aureus: NEGATIVE

## 2022-07-25 SURGERY — FUSION, JOINT, FOOT
Anesthesia: General | Site: Foot | Laterality: Left

## 2022-07-25 MED ORDER — METHOCARBAMOL 1000 MG/10ML IJ SOLN: 500.0000 mg | Freq: Four times a day (QID) | INTRAVENOUS | Status: DC | PRN

## 2022-07-25 MED ORDER — DOCUSATE SODIUM 100 MG PO CAPS
100.0000 mg | ORAL_CAPSULE | Freq: Two times a day (BID) | ORAL | Status: DC
  Administered 2022-07-25 – 2022-07-26 (×2): 100 mg via ORAL
  Filled 2022-07-25 (×2): qty 1

## 2022-07-25 MED ORDER — PROPOFOL 10 MG/ML IV BOLUS
INTRAVENOUS | Status: DC | PRN
  Administered 2022-07-25: 150 mg via INTRAVENOUS

## 2022-07-25 MED ORDER — DEXAMETHASONE SODIUM PHOSPHATE 10 MG/ML IJ SOLN
INTRAMUSCULAR | Status: DC | PRN
  Administered 2022-07-25: 5 mg via INTRAVENOUS

## 2022-07-25 MED ORDER — PROPOFOL 10 MG/ML IV BOLUS
INTRAVENOUS | Status: AC
  Filled 2022-07-25: qty 20

## 2022-07-25 MED ORDER — METOCLOPRAMIDE HCL 5 MG PO TABS: 5.0000 mg | ORAL_TABLET | Freq: Three times a day (TID) | ORAL | Status: DC | PRN

## 2022-07-25 MED ORDER — METHOCARBAMOL 500 MG PO TABS
500.0000 mg | ORAL_TABLET | Freq: Four times a day (QID) | ORAL | Status: DC | PRN
  Administered 2022-07-26 (×2): 500 mg via ORAL
  Filled 2022-07-25 (×2): qty 1

## 2022-07-25 MED ORDER — FENTANYL CITRATE (PF) 250 MCG/5ML IJ SOLN
INTRAMUSCULAR | Status: AC
  Filled 2022-07-25: qty 5

## 2022-07-25 MED ORDER — ROPINIROLE HCL 0.5 MG PO TABS
3.0000 mg | ORAL_TABLET | Freq: Every day | ORAL | Status: DC
  Administered 2022-07-25: 3 mg via ORAL
  Filled 2022-07-25: qty 6

## 2022-07-25 MED ORDER — LACTATED RINGERS IV SOLN: INTRAVENOUS | Status: DC

## 2022-07-25 MED ORDER — PHENYLEPHRINE 80 MCG/ML (10ML) SYRINGE FOR IV PUSH (FOR BLOOD PRESSURE SUPPORT)
PREFILLED_SYRINGE | INTRAVENOUS | Status: DC | PRN
  Administered 2022-07-25 (×2): 80 ug via INTRAVENOUS

## 2022-07-25 MED ORDER — MAGNESIUM CITRATE PO SOLN: 1.0000 | Freq: Once | ORAL | Status: DC | PRN

## 2022-07-25 MED ORDER — CHLORHEXIDINE GLUCONATE 0.12 % MT SOLN
15.0000 mL | Freq: Once | OROMUCOSAL | Status: AC
  Administered 2022-07-25: 15 mL via OROMUCOSAL

## 2022-07-25 MED ORDER — OXYCODONE HCL 5 MG PO TABS
10.0000 mg | ORAL_TABLET | ORAL | Status: DC | PRN
  Administered 2022-07-26: 10 mg via ORAL
  Filled 2022-07-25: qty 2

## 2022-07-25 MED ORDER — OXYCODONE HCL 5 MG PO TABS
5.0000 mg | ORAL_TABLET | ORAL | Status: DC | PRN
  Administered 2022-07-25 – 2022-07-26 (×3): 5 mg via ORAL
  Filled 2022-07-25 (×4): qty 1

## 2022-07-25 MED ORDER — AMISULPRIDE (ANTIEMETIC) 5 MG/2ML IV SOLN
INTRAVENOUS | Status: AC
  Filled 2022-07-25: qty 4

## 2022-07-25 MED ORDER — CEFAZOLIN SODIUM-DEXTROSE 2-4 GM/100ML-% IV SOLN
INTRAVENOUS | Status: AC
  Filled 2022-07-25: qty 100

## 2022-07-25 MED ORDER — LEVOTHYROXINE SODIUM 25 MCG PO TABS
25.0000 ug | ORAL_TABLET | Freq: Every day | ORAL | Status: DC
  Administered 2022-07-26: 25 ug via ORAL
  Filled 2022-07-25: qty 1

## 2022-07-25 MED ORDER — ONDANSETRON HCL 4 MG/2ML IJ SOLN
INTRAMUSCULAR | Status: DC | PRN
  Administered 2022-07-25: 4 mg via INTRAVENOUS

## 2022-07-25 MED ORDER — CEFAZOLIN SODIUM-DEXTROSE 1-4 GM/50ML-% IV SOLN
1.0000 g | Freq: Four times a day (QID) | INTRAVENOUS | Status: AC
  Administered 2022-07-25 – 2022-07-26 (×3): 1 g via INTRAVENOUS
  Filled 2022-07-25 (×3): qty 50

## 2022-07-25 MED ORDER — APREPITANT 40 MG PO CAPS
40.0000 mg | ORAL_CAPSULE | Freq: Once | ORAL | Status: AC
  Administered 2022-07-25: 40 mg via ORAL
  Filled 2022-07-25: qty 1

## 2022-07-25 MED ORDER — 0.9 % SODIUM CHLORIDE (POUR BTL) OPTIME
TOPICAL | Status: DC | PRN
  Administered 2022-07-25: 1000 mL

## 2022-07-25 MED ORDER — ORAL CARE MOUTH RINSE: 15.0000 mL | Freq: Once | OROMUCOSAL | Status: AC

## 2022-07-25 MED ORDER — BUPIVACAINE HCL (PF) 0.5 % IJ SOLN
INTRAMUSCULAR | Status: DC | PRN
  Administered 2022-07-25 (×2): 15 mL via PERINEURAL

## 2022-07-25 MED ORDER — HYDROMORPHONE HCL 1 MG/ML IJ SOLN: 0.2500 mg | INTRAMUSCULAR | Status: DC | PRN

## 2022-07-25 MED ORDER — PROPOFOL 1000 MG/100ML IV EMUL
INTRAVENOUS | Status: AC
  Filled 2022-07-25: qty 100

## 2022-07-25 MED ORDER — POLYETHYLENE GLYCOL 3350 17 G PO PACK: 17.0000 g | PACK | Freq: Every day | ORAL | Status: DC | PRN

## 2022-07-25 MED ORDER — SUGAMMADEX SODIUM 200 MG/2ML IV SOLN
INTRAVENOUS | Status: DC | PRN
  Administered 2022-07-25: 150 mg via INTRAVENOUS

## 2022-07-25 MED ORDER — FENTANYL CITRATE (PF) 250 MCG/5ML IJ SOLN
INTRAMUSCULAR | Status: DC | PRN
  Administered 2022-07-25 (×2): 50 ug via INTRAVENOUS

## 2022-07-25 MED ORDER — ROCURONIUM BROMIDE 10 MG/ML (PF) SYRINGE
PREFILLED_SYRINGE | INTRAVENOUS | Status: DC | PRN
  Administered 2022-07-25: 60 mg via INTRAVENOUS

## 2022-07-25 MED ORDER — CHLORHEXIDINE GLUCONATE 0.12 % MT SOLN
OROMUCOSAL | Status: AC
  Filled 2022-07-25: qty 15

## 2022-07-25 MED ORDER — SODIUM CHLORIDE 0.9 % IV SOLN: INTRAVENOUS | Status: DC

## 2022-07-25 MED ORDER — BUPIVACAINE LIPOSOME 1.3 % IJ SUSP
INTRAMUSCULAR | Status: DC | PRN
  Administered 2022-07-25: 10 mL via PERINEURAL

## 2022-07-25 MED ORDER — ONDANSETRON HCL 4 MG/2ML IJ SOLN: 4.0000 mg | Freq: Once | INTRAMUSCULAR | Status: DC | PRN

## 2022-07-25 MED ORDER — ONDANSETRON HCL 4 MG PO TABS
4.0000 mg | ORAL_TABLET | Freq: Four times a day (QID) | ORAL | Status: DC | PRN
  Administered 2022-07-26: 4 mg via ORAL
  Filled 2022-07-25: qty 1

## 2022-07-25 MED ORDER — HYDROMORPHONE HCL 1 MG/ML IJ SOLN: 0.5000 mg | INTRAMUSCULAR | Status: DC | PRN

## 2022-07-25 MED ORDER — CLOPIDOGREL BISULFATE 75 MG PO TABS
75.0000 mg | ORAL_TABLET | Freq: Every day | ORAL | Status: DC
  Administered 2022-07-25 – 2022-07-26 (×2): 75 mg via ORAL
  Filled 2022-07-25 (×2): qty 1

## 2022-07-25 MED ORDER — ESTRADIOL 0.5 MG PO TABS
1.0000 mg | ORAL_TABLET | Freq: Every day | ORAL | Status: DC
  Administered 2022-07-25 – 2022-07-26 (×2): 1 mg via ORAL
  Filled 2022-07-25 (×2): qty 2

## 2022-07-25 MED ORDER — PROPOFOL 500 MG/50ML IV EMUL
INTRAVENOUS | Status: DC | PRN
  Administered 2022-07-25: 150 ug/kg/min via INTRAVENOUS

## 2022-07-25 MED ORDER — CEFAZOLIN SODIUM-DEXTROSE 2-4 GM/100ML-% IV SOLN
2.0000 g | INTRAVENOUS | Status: AC
  Administered 2022-07-25: 2 g via INTRAVENOUS

## 2022-07-25 MED ORDER — METOPROLOL SUCCINATE ER 25 MG PO TB24
25.0000 mg | ORAL_TABLET | Freq: Every day | ORAL | Status: DC
  Administered 2022-07-25: 25 mg via ORAL
  Filled 2022-07-25 (×2): qty 1

## 2022-07-25 MED ORDER — OXYCODONE HCL 5 MG PO TABS: 5.0000 mg | ORAL_TABLET | Freq: Once | ORAL | Status: DC | PRN

## 2022-07-25 MED ORDER — LIDOCAINE 2% (20 MG/ML) 5 ML SYRINGE
INTRAMUSCULAR | Status: DC | PRN
  Administered 2022-07-25: 40 mg via INTRAVENOUS

## 2022-07-25 MED ORDER — AMISULPRIDE (ANTIEMETIC) 5 MG/2ML IV SOLN
10.0000 mg | Freq: Once | INTRAVENOUS | Status: AC
  Administered 2022-07-25: 10 mg via INTRAVENOUS

## 2022-07-25 MED ORDER — ONDANSETRON HCL 4 MG/2ML IJ SOLN
4.0000 mg | Freq: Four times a day (QID) | INTRAMUSCULAR | Status: DC | PRN
  Administered 2022-07-25 (×2): 4 mg via INTRAVENOUS
  Filled 2022-07-25 (×3): qty 2

## 2022-07-25 MED ORDER — METOCLOPRAMIDE HCL 5 MG/ML IJ SOLN
5.0000 mg | Freq: Three times a day (TID) | INTRAMUSCULAR | Status: DC | PRN
  Administered 2022-07-26: 10 mg via INTRAVENOUS
  Filled 2022-07-25: qty 2

## 2022-07-25 MED ORDER — OXYCODONE HCL 5 MG/5ML PO SOLN: 5.0000 mg | Freq: Once | ORAL | Status: DC | PRN

## 2022-07-25 MED ORDER — BISACODYL 10 MG RE SUPP: 10.0000 mg | Freq: Every day | RECTAL | Status: DC | PRN

## 2022-07-25 MED ORDER — ACETAMINOPHEN 325 MG PO TABS: 325.0000 mg | ORAL_TABLET | Freq: Four times a day (QID) | ORAL | Status: DC | PRN

## 2022-07-25 SURGICAL SUPPLY — 49 items
BAG COUNTER SPONGE SURGICOUNT (BAG) ×1 IMPLANT
BAG SPNG CNTER NS LX DISP (BAG) ×1
BANDAGE ESMARK 6X9 LF (GAUZE/BANDAGES/DRESSINGS) IMPLANT
BIT DRILL 4.5 TYB 9 (BIT) IMPLANT
BLADE SAW SGTL HD 18.5X60.5X1. (BLADE) ×1 IMPLANT
BLADE SURG 10 STRL SS (BLADE) IMPLANT
BNDG CMPR 9X6 STRL LF SNTH (GAUZE/BANDAGES/DRESSINGS)
BNDG COHESIVE 4X5 TAN STRL (GAUZE/BANDAGES/DRESSINGS) ×1 IMPLANT
BNDG ESMARK 6X9 LF (GAUZE/BANDAGES/DRESSINGS)
BNDG GAUZE DERMACEA FLUFF 4 (GAUZE/BANDAGES/DRESSINGS) ×2 IMPLANT
BNDG GZE DERMACEA 4 6PLY (GAUZE/BANDAGES/DRESSINGS) ×1
BUR EGG ELITE 4.0 (BURR) IMPLANT
COTTON STERILE ROLL (GAUZE/BANDAGES/DRESSINGS) ×1 IMPLANT
COVER MAYO STAND STRL (DRAPES) IMPLANT
COVER SURGICAL LIGHT HANDLE (MISCELLANEOUS) ×2 IMPLANT
DRAPE INCISE IOBAN 66X45 STRL (DRAPES) ×1 IMPLANT
DRAPE OEC MINIVIEW 54X84 (DRAPES) IMPLANT
DRAPE U-SHAPE 47X51 STRL (DRAPES) ×1 IMPLANT
DRSG ADAPTIC 3X8 NADH LF (GAUZE/BANDAGES/DRESSINGS) ×1 IMPLANT
DURAPREP 26ML APPLICATOR (WOUND CARE) ×1 IMPLANT
ELECT REM PT RETURN 9FT ADLT (ELECTROSURGICAL) ×1
ELECTRODE REM PT RTRN 9FT ADLT (ELECTROSURGICAL) ×1 IMPLANT
GAUZE SPONGE 4X4 12PLY STRL (GAUZE/BANDAGES/DRESSINGS) ×1 IMPLANT
GAUZE SPONGE 4X4 12PLY STRL LF (GAUZE/BANDAGES/DRESSINGS) IMPLANT
GLOVE BIOGEL PI IND STRL 9 (GLOVE) ×1 IMPLANT
GLOVE SURG ORTHO 9.0 STRL STRW (GLOVE) ×1 IMPLANT
GOWN STRL REUS W/ TWL XL LVL3 (GOWN DISPOSABLE) ×3 IMPLANT
GOWN STRL REUS W/TWL XL LVL3 (GOWN DISPOSABLE) ×3
GUIDEWIRE TYB 2X9 (WIRE) IMPLANT
KIT BASIN OR (CUSTOM PROCEDURE TRAY) ×1 IMPLANT
KIT TURNOVER KIT B (KITS) ×1 IMPLANT
MANIFOLD NEPTUNE II (INSTRUMENTS) ×1 IMPLANT
NS IRRIG 1000ML POUR BTL (IV SOLUTION) ×1 IMPLANT
PACK ORTHO EXTREMITY (CUSTOM PROCEDURE TRAY) ×1 IMPLANT
PAD ABD 8X10 STRL (GAUZE/BANDAGES/DRESSINGS) IMPLANT
PAD ARMBOARD 7.5X6 YLW CONV (MISCELLANEOUS) ×2 IMPLANT
PAD CAST 4YDX4 CTTN HI CHSV (CAST SUPPLIES) ×1 IMPLANT
PADDING CAST COTTON 4X4 STRL (CAST SUPPLIES) ×1
PUTTY DBM STAGRAFT PLUS 5CC (Putty) IMPLANT
SCREW CANN HDLS SHT 6.5X70 (Screw) IMPLANT
SPONGE T-LAP 18X18 ~~LOC~~+RFID (SPONGE) ×1 IMPLANT
STAPLE NITINOL 20X20 (Staple) IMPLANT
SUCTION FRAZIER HANDLE 10FR (MISCELLANEOUS) ×1
SUCTION TUBE FRAZIER 10FR DISP (MISCELLANEOUS) ×1 IMPLANT
SUT ETHILON 2 0 PSLX (SUTURE) ×3 IMPLANT
TOWEL GREEN STERILE (TOWEL DISPOSABLE) ×1 IMPLANT
TOWEL GREEN STERILE FF (TOWEL DISPOSABLE) ×1 IMPLANT
TUBE CONNECTING 12X1/4 (SUCTIONS) ×1 IMPLANT
WATER STERILE IRR 1000ML POUR (IV SOLUTION) ×1 IMPLANT

## 2022-07-25 NOTE — Transfer of Care (Signed)
Immediate Anesthesia Transfer of Care Note  Patient: Melissa Jordan  Procedure(s) Performed: LEFT TALONAVICULAR AND SUBTALAR FUSION (Left: Foot) General Patient Location: PACU  Anesthesia Type:General  Level of Consciousness: awake and alert   Airway & Oxygen Therapy: Patient Spontanous Breathing and Patient connected to nasal cannula oxygen  Post-op Assessment: Report given to RN and Post -op Vital signs reviewed and stable  Post vital signs: Reviewed and stable  Last Vitals:  Vitals Value Taken Time  BP 128/74 07/25/22 0916  Temp 36.9 C 07/25/22 0906  Pulse 66 07/25/22 0929  Resp 12 07/25/22 0929  SpO2 99 % 07/25/22 0929  Vitals shown include unvalidated device data.  Last Pain:  Vitals:   07/25/22 0906  TempSrc:   PainSc: Asleep         Complications: No notable events documented.

## 2022-07-25 NOTE — Anesthesia Procedure Notes (Signed)
Anesthesia Regional Block: Adductor canal block   Pre-Anesthetic Checklist: , timeout performed,  Correct Patient, Correct Site, Correct Laterality,  Correct Procedure, Correct Position, site marked,  Risks and benefits discussed,  Surgical consent,  Pre-op evaluation,  At surgeon's request and post-op pain management  Laterality: Left  Prep: chloraprep       Needles:  Injection technique: Single-shot  Needle Type: Echogenic Stimulator Needle     Needle Length: 10cm  Needle Gauge: 21   Needle insertion depth: 6 cm   Additional Needles:   Procedures:,,,, ultrasound used (permanent image in chart),,    Narrative:  Start time: 07/25/2022 7:30 AM End time: 07/25/2022 7:35 AM Injection made incrementally with aspirations every 5 mL.  Performed by: Personally  Anesthesiologist: Josephine Igo, MD  Additional Notes: Timeout performed. Patient sedated. Relevant anatomy ID'd using [place]. Incremental 2-13ml injection of LA with frequent aspiration. Patient tolerated procedure well.     Left Adductor Canal Block

## 2022-07-25 NOTE — Progress Notes (Signed)
Orthopedic Tech Progress Note Patient Details:  Melissa Jordan 1946-05-11 [id number]  Ortho Devices Type of Ortho Device: CAM walker Ortho Device/Splint Location: LLE Ortho Device/Splint Interventions: Ordered, Adjustment   Post Interventions Patient Tolerated: Well Instructions Provided: Adjustment of device, Care of device  E[place] 07/25/2022, 2:23 PM

## 2022-07-25 NOTE — Anesthesia Procedure Notes (Signed)
Procedure Name: Intubation Date/Time: 07/25/2022 9:31 AM  Performed by: Timoteo Expose, CRNAPre-anesthesia Checklist: Patient identified, Emergency Drugs available, Suction available, Patient being monitored and Timeout performed Patient Re-evaluated:Patient Re-evaluated prior to induction Oxygen Delivery Method: Circle system utilized Preoxygenation: Pre-oxygenation with 100% oxygen Induction Type: IV induction Laryngoscope Size: Mac and 3 Grade View: Grade I Tube type: Oral Tube size: 6.5 mm Number of attempts: 1 Placement Confirmation: ETT inserted through vocal cords under direct vision, positive ETCO2, breath sounds checked- equal and bilateral and CO2 detector Secured at: 20 cm Tube secured with: Tape Dental Injury: Teeth and Oropharynx as per pre-operative assessment

## 2022-07-25 NOTE — Anesthesia Procedure Notes (Signed)
Anesthesia Regional Block: Popliteal block   Pre-Anesthetic Checklist: , timeout performed,  Correct Patient, Correct Site, Correct Laterality,,  Laterality: Left  Prep: chloraprep       Needles:  Injection technique: Single-shot  Needle Type: Echogenic Stimulator Needle     Needle Length: 10cm  Needle Gauge: 21   Needle insertion depth: 7 cm   Additional Needles:   Procedures:,,,, ultrasound used (permanent image in chart),,   Motor weakness within 10 minutes.  Narrative:  Start time: 07/25/2022 7:25 AM End time: 07/25/2022 7:30 AM Injection made incrementally with aspirations every 5 mL.  Performed by: Personally  Anesthesiologist: Josephine Igo, MD  Additional Notes: Timeout performed. Patient sedated. Relevant anatomy ID'd using [place]. Incremental 2-43ml injection of LA with frequent aspiration. Patient tolerated procedure well.     Left Popliteal Block

## 2022-07-25 NOTE — Anesthesia Postprocedure Evaluation (Signed)
Anesthesia Post Note  Patient: Melissa Jordan  Procedure(s) Performed: LEFT TALONAVICULAR AND SUBTALAR FUSION (Left: Foot)     Patient location during evaluation: PACU Anesthesia Type: General Level of consciousness: awake and alert and oriented Pain management: pain level controlled Vital Signs Assessment: post-procedure vital signs reviewed and stable Respiratory status: spontaneous breathing, nonlabored ventilation and respiratory function stable Cardiovascular status: blood pressure returned to baseline and stable Postop Assessment: no apparent nausea or vomiting Anesthetic complications: no   No notable events documented.  Last Vitals:  Vitals:   07/25/22 0906 07/25/22 0915  BP: 120/70 (!) 126/111  Pulse: 72 74  Resp: 12 14  Temp: 36.9 C   SpO2: 100% 95%    Last Pain:  Vitals:   07/25/22 0906  TempSrc:   PainSc: Asleep                 Unnamed Zeien A.
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
## 2022-07-25 NOTE — Evaluation (Signed)
Physical Therapy Evaluation Patient Details Name: Melissa Jordan MRN: [id number] DOB: 01/08/1946 Today's Date: 07/25/2022  History of Present Illness  Patient is a 77 y/o female who presents with posterior tibial tendon insufficiency now s/p talonavicular and subtalar fusion 2/2. PMH includes Bil TKAs, back surgeries, HTN, prior n/v and soft BP post surgery.  Clinical Impression  Patient presents with impaired sensation LLE, nausea, lightheadedness and post surgical deficits s/p above. Pt is independent with ADLs/IADLs and lives with spouse PTA. Today, pt tolerated bed mobility and transfers with supervision for safety. Able to perform lateral scoot tx into chair towards right while maintaining TDWB on LLE. + nausea and lightheadedness deferring standing/hopping. RN made aware. Pt with decreased distal to knee and into foot from nerve block. Has support from spouse at home. Already has BSC and w/c for mobility. Will need RW. Will follow acutely to maximize independence and mobility prior to return home.     Recommendations for follow up therapy are one component of a multi-disciplinary discharge planning process, led by the attending physician.  Recommendations may be updated based on patient status, additional functional criteria and insurance authorization.  Follow Up Recommendations No PT follow up      Assistance Recommended at Discharge Intermittent Supervision/Assistance  Patient can return home with the following  A little help with walking and/or transfers;A little help with bathing/dressing/bathroom;Assist for transportation;Assistance with cooking/housework    Equipment Recommendations Rolling walker (2 wheels)  Recommendations for Other Services       Functional Status Assessment Patient has had a recent decline in their functional status and demonstrates the ability to make significant improvements in function in a reasonable and predictable amount of time.     Precautions /  Restrictions Precautions Precautions: Fall Required Braces or Orthoses: Other Brace Other Brace: CAM boot Restrictions Weight Bearing Restrictions: Yes LLE Weight Bearing: Touchdown weight bearing      Mobility  Bed Mobility Overal bed mobility: Needs Assistance Bed Mobility: Supine to Sit     Supine to sit: Supervision, HOB elevated     General bed mobility comments: Using UEs to move LLE to EOB, no assist needed. + nausea and slight lightheadedness but BP stable, 120s/70s.    Transfers Overall transfer level: Needs assistance   Transfers: Bed to chair/wheelchair/BSC            Lateral/Scoot Transfers: Supervision General transfer comment: Able to scoot along side bed into chair towards right with supervision for safety. Able to maintain TDWB LLE with transfer. + nausea.    Ambulation/Gait               General Gait Details: Deferred due to nausea.  Stairs            Wheelchair Mobility    Modified Rankin (Stroke Patients Only)       Balance Overall balance assessment: Needs assistance Sitting-balance support: Feet supported, No upper extremity supported Sitting balance-Leahy Scale: Good                                       Pertinent Vitals/Pain Pain Assessment Pain Assessment: No/denies pain    Home Living Family/patient expects to be discharged to:: Private residence Living Arrangements: Spouse/significant other Available Help at Discharge: Family;Available 24 hours/day Type of Home: House Home Access: Stairs to enter;Ramped entrance Entrance Stairs-Rails: Right;Left Entrance Stairs-Number of Steps: 11 vs ramp in back yard  Home Layout: Two level;Laundry or work area in [place]: BSC/3in1;Wheelchair - manual Additional Comments: borrowed w/c    Prior Function Prior Level of Function : Independent/Modified Independent             Mobility Comments: independent, worked in garden, has Chief of Staff ADLs Comments: independent     Journalist, newspaper   Dominant Hand: Right    Extremity/Trunk Assessment   Upper Extremity Assessment Upper Extremity Assessment: Defer to OT evaluation    Lower Extremity Assessment Lower Extremity Assessment: LLE deficits/detail LLE Deficits / Details: No movement in toes, decreased sensation distal to knee and into foot from nerve block LLE Sensation: decreased light touch    Cervical / Trunk Assessment Cervical / Trunk Assessment: Normal  Communication   Communication: No difficulties  Cognition Arousal/Alertness: Awake/alert Behavior During Therapy: WFL for tasks assessed/performed Overall Cognitive Status: Within Functional Limits for tasks assessed                                          General Comments General comments (skin integrity, edema, etc.): VSS on RA. BP stable, 120s/70s.    Exercises     Assessment/Plan    PT Assessment Patient needs continued PT services  PT Problem List Decreased mobility;Decreased range of motion;Impaired sensation;Decreased balance;Decreased knowledge of use of DME;Decreased skin integrity       PT Treatment Interventions Therapeutic activities;DME instruction;Gait training;Therapeutic exercise;Patient/family education;Wheelchair mobility training;Balance training;Functional mobility training    PT Goals (Current goals can be found in the Care Plan section)  Acute Rehab PT Goals Patient Stated Goal: to go home and get back to independence PT Goal Formulation: With patient Time For Goal Achievement: 08/08/22 Potential to Achieve Goals: Good    Frequency Min 3X/week     Co-evaluation               AM-PAC PT "6 Clicks" Mobility  Outcome Measure Help needed turning from your back to your side while in a flat bed without using bedrails?: A Little Help needed moving from lying on your back to sitting on the side of a flat bed without using bedrails?: A Little Help  needed moving to and from a bed to a chair (including a wheelchair)?: A Little Help needed standing up from a chair using your arms (e.g., wheelchair or bedside chair)?: A Little Help needed to walk in hospital room?: A Lot Help needed climbing 3-5 steps with a railing? : Total 6 Click Score: 15    End of Session Equipment Utilized During Treatment: Gait belt Activity Tolerance: Patient tolerated treatment well;Other (comment) (nausea) Patient left: in chair;with call bell/phone within reach;with nursing/sitter in room Nurse Communication: Mobility status;Other (comment) (nausea meds) PT Visit Diagnosis: Difficulty in walking, not elsewhere classified (R26.2);Unsteadiness on feet (R26.81)    Time: 3810-1751 PT Time Calculation (min) (ACUTE ONLY): 30 min   Charges:   PT Evaluation $PT Eval Low Complexity: 1 Low PT Treatments $Therapeutic Activity: 8-22 mins        Marisa Severin, PT, DPT Acute Rehabilitation Services Secure chat preferred Office [place] 07/25/2022, 3:51 PM

## 2022-07-25 NOTE — Op Note (Signed)
07/25/2022  8:55 AM  PATIENT:  Melissa Jordan    PRE-OPERATIVE DIAGNOSIS:  Posterior Tibial Tendon Insufficiency  POST-OPERATIVE DIAGNOSIS:  Same  PROCEDURE:  LEFT TALONAVICULAR AND SUBTALAR FUSION C-arm fluoroscopy to verify reduction.  SURGEON:  Newt Minion, MD  PHYSICIAN ASSISTANT:None ANESTHESIA:   General  PREOPERATIVE INDICATIONS:  Melissa Jordan is a  77 y.o. female with a diagnosis of Posterior Tibial Tendon Insufficiency who failed conservative measures and elected for surgical management.    The risks benefits and alternatives were discussed with the patient preoperatively including but not limited to the risks of infection, bleeding, nerve injury, cardiopulmonary complications, the need for revision surgery, among others, and the patient was willing to proceed.  OPERATIVE IMPLANTS: 220 mm staples and one 6.5 headless cannulated screw 70 mm  @ENCIMAGES @  OPERATIVE FINDINGS: See arthroscopy verified reduction of the talonavicular and subtalar fusion  OPERATIVE PROCEDURE: Patient brought the operating room after undergoing a regional block she then underwent a general anesthetic.  After adequate levels anesthesia were obtained patient's left lower extremity was prepped using DuraPrep draped into a sterile field a timeout was called.  An oblique incision was made over the sinus Tarsi this was carried down to the subtalar joint.  Retractor was placed to protect the peroneal tendons.  Under direct visualization a bur was used to debride the articular cartilage from the subtalar joint.  This was then further debrided with a curette.  After debridement of the subtalar joint down to bleeding viable subchondral bone tension was then focused on the talonavicular joint.  A dorsal incision was made between the anterior tibial tendon and the EHL tendon.  The tendons were protected and the incision was carried down to the talonavicular joint.  Under direct visualization the talonavicular  joint was debrided of articular cartilage with the bur.  This was then further debrided with a curette.  The wounds were irrigated normal saline both joints were then filled with a total of 2.5 cc of stay graft into each joint.  The foot was reduced dorsiflexion to neutral and 2 staples were placed across the talonavicular joint.  See arthroscopy verified reduction.  Attention was then focused on the subtalar joint.  Incision was made on the heel and a guidewire was inserted from the calcaneus into the talus.  C-arm possibly verified alignment the cortex was drilled and a 70 mm screw was advanced to stabilize the subtalar joint.  C-arm fluoroscopy verified reduction there was good range of motion of the ankle.  The incisions were closed using 2-0 nylon a sterile dressing was applied patient was extubated taken to PACU in stable condition.   DISCHARGE PLANNING:  Antibiotic duration: 24-hour IV antibiotics  Weightbearing: Touchdown weightbearing on the left  Pain medication: Opioid pathway  Dressing care/ Wound VAC: Dry dressing  Ambulatory devices: Walker  Discharge to: Anticipate discharge to home in the morning.  Follow-up: In the office 1 week post operative.

## 2022-07-26 ENCOUNTER — Encounter (HOSPITAL_COMMUNITY): Payer: Self-pay | Admitting: Orthopedic Surgery

## 2022-07-26 DIAGNOSIS — D759 Disease of blood and blood-forming organs, unspecified: Secondary | ICD-10-CM | POA: Diagnosis not present

## 2022-07-26 DIAGNOSIS — R112 Nausea with vomiting, unspecified: Secondary | ICD-10-CM | POA: Diagnosis not present

## 2022-07-26 DIAGNOSIS — R531 Weakness: Secondary | ICD-10-CM | POA: Diagnosis not present

## 2022-07-26 DIAGNOSIS — M76822 Posterior tibial tendinitis, left leg: Secondary | ICD-10-CM | POA: Diagnosis not present

## 2022-07-26 DIAGNOSIS — D649 Anemia, unspecified: Secondary | ICD-10-CM | POA: Diagnosis not present

## 2022-07-26 DIAGNOSIS — I10 Essential (primary) hypertension: Secondary | ICD-10-CM | POA: Diagnosis not present

## 2022-07-26 DIAGNOSIS — Z7902 Long term (current) use of antithrombotics/antiplatelets: Secondary | ICD-10-CM | POA: Diagnosis not present

## 2022-07-26 DIAGNOSIS — I480 Paroxysmal atrial fibrillation: Secondary | ICD-10-CM | POA: Diagnosis not present

## 2022-07-26 DIAGNOSIS — E039 Hypothyroidism, unspecified: Secondary | ICD-10-CM | POA: Diagnosis not present

## 2022-07-26 MED ORDER — OXYCODONE-ACETAMINOPHEN 5-325 MG PO TABS: 1.0000 | ORAL_TABLET | ORAL | 0 refills | Status: DC | PRN

## 2022-07-26 NOTE — Progress Notes (Signed)
Patient ID: Melissa Jordan, female   DOB: 22-Jun-1946, 77 y.o.   MRN: [id number] Patient has no pain at this time dressing is clean dry and intact.  Plan for discharge to home today.  Touchdown weightbearing on the left with the fracture boot on.

## 2022-07-26 NOTE — Discharge Summary (Signed)
Discharge Diagnoses:  Principal Problem:   Posterior tibial tendon dysfunction (PTTD) of left lower extremity   Surgeries: Procedure(s): LEFT TALONAVICULAR AND SUBTALAR FUSION on 07/25/2022    Consultants:   Discharged Condition: Improved  Hospital Course: Melissa Jordan is an 77 y.o. female who was admitted 07/25/2022 with a chief complaint of posterior tibial tendon insufficiency., with a final diagnosis of Posterior Tibial Tendon Insufficiency.  Patient was brought to the operating room on 07/25/2022 and underwent Procedure(s): LEFT TALONAVICULAR AND SUBTALAR FUSION.    Patient was given perioperative antibiotics:  Anti-infectives (From admission, onward)    Start     Dose/Rate Route Frequency Ordered Stop   07/25/22 1400  ceFAZolin (ANCEF) IVPB 1 g/50 mL premix        1 g 100 mL/hr over 30 Minutes Intravenous Every 6 hours 07/25/22 1210 07/26/22 0133   07/25/22 0600  ceFAZolin (ANCEF) IVPB 2g/100 mL premix        2 g 200 mL/hr over 30 Minutes Intravenous On call to O.R. 07/25/22 0541 07/25/22 0820   07/25/22 0546  ceFAZolin (ANCEF) 2-4 GM/100ML-% IVPB       Note to Pharmacy: Gabriel Rainwater L: cabinet override      07/25/22 0546 07/25/22 0810     .  Patient was given sequential compression devices, early ambulation, and aspirin for DVT prophylaxis.  Recent vital signs: Patient Vitals for the past 24 hrs:  BP Temp Temp src Pulse Resp SpO2  07/26/22 0854 (!) 91/51 98 F (36.7 C) Oral 60 17 100 %  07/25/22 2121 104/66 97.8 F (36.6 C) Oral 68 15 95 %  07/25/22 1145 112/62 (!) 97.5 F (36.4 C) -- 64 13 95 %  07/25/22 1130 (!) 150/80 -- -- 66 14 92 %  .  Recent laboratory studies: DG MINI C-ARM IMAGE ONLY  Result Date: 07/25/2022 There is no interpretation for this exam.  This order is for images obtained during a surgical procedure.  Please See "Surgeries" Tab for more information regarding the procedure.    Discharge Medications:   Allergies as of 07/26/2022        Reactions   Lyrica [pregabalin] Palpitations, Other (See Comments)   "thought I was going to have a heart attack; heart started pounding so hard, I couldn't catch my breath" (06/28/2012)   Meclizine Other (See Comments)   "what was in my mind to say wasn't what came out to say; I was kind of in another world" (06/28/2012)   Penicillins Itching, Rash   Has patient had a PCN reaction causing immediate rash, facial/tongue/throat swelling, SOB or lightheadedness with hypotension: No Has patient had a PCN reaction causing severe rash involving mucus membranes or skin necrosis: No Has patient had a PCN reaction that required hospitalization No Has patient had a PCN reaction occurring within the last 10 years:   # # YES # #  If all of the above answers are "NO", then may proceed with Cephalosporin use. Has tolerated cefazolin (2020, 2023)   Poison [place]ac Extract Swelling, Rash, Other (See Comments)   Blisters   Propoxyphene Nausea And Vomiting   Bactrim [sulfamethoxazole-trimethoprim] Rash   Codeine Nausea Only   Reaction only in pill form   Morphine Sulfate Rash   Propoxyphene N-acetaminophen Nausea And Vomiting   "Darvocet"   Vancomycin Rash        Medication List     TAKE these medications    acetaminophen 500 MG tablet Commonly known as: TYLENOL Take 500-1,000 mg  by mouth every 6 (six) hours as needed for moderate pain.   albuterol 108 (90 Base) MCG/ACT inhaler Commonly known as: VENTOLIN HFA Inhale 2 puffs into the lungs every 6 (six) hours as needed for wheezing or shortness of breath.   azelastine 0.1 % nasal spray Commonly known as: ASTELIN Place 1 spray into both nostrils 2 (two) times daily. Use in each nostril as directed What changed:  when to take this reasons to take this   CALCIUM 600 + D PO Take 1 tablet by mouth at bedtime.   clopidogrel 75 MG tablet Commonly known as: PLAVIX Take 1 tablet (75 mg total) by mouth daily.   doxycycline 100 MG tablet Commonly  known as: VIBRA-TABS Take 1 tablet (100 mg total) by mouth 2 (two) times daily.   estradiol 1 MG tablet Commonly known as: ESTRACE TAKE 1 TABLET BY MOUTH  DAILY   levothyroxine 25 MCG tablet Commonly known as: SYNTHROID Take 1 tablet (25 mcg total) by mouth daily. What changed:  when to take this additional instructions   loratadine 10 MG tablet Commonly known as: CLARITIN Take 10 mg by mouth in the morning.   magnesium oxide 400 MG tablet Commonly known as: MAG-OX Take 400 mg by mouth at bedtime.   metoprolol succinate 25 MG 24 hr tablet Commonly known as: Toprol XL Take 1 tablet (25 mg total) by mouth daily. What changed: when to take this   mometasone 50 MCG/ACT nasal spray Commonly known as: NASONEX Place 2 sprays into the nose daily as needed (congestion).   oxyCODONE-acetaminophen 5-325 MG tablet Commonly known as: PERCOCET/ROXICET Take 1 tablet by mouth every 4 (four) hours as needed.   promethazine-dextromethorphan 6.25-15 MG/5ML syrup Commonly known as: PROMETHAZINE-DM Take 5 mLs by mouth 4 (four) times daily as needed.   rOPINIRole 3 MG tablet Commonly known as: REQUIP Take 1 tablet (3 mg total) by mouth at bedtime.   Systane 0.4-0.3 % Soln Generic drug: Polyethyl Glycol-Propyl Glycol Place 1 drop into both eyes daily.   traMADol 50 MG tablet Commonly known as: ULTRAM Take 50 mg by mouth every 6 (six) hours as needed for pain.   triamcinolone ointment 0.1 % Commonly known as: KENALOG Apply 1 Application topically 2 (two) times daily. What changed:  when to take this reasons to take this   Vitamin D 50 MCG (2000 UT) tablet Take 3,000 Units by mouth daily.               Durable Medical Equipment  (From admission, onward)           Start     Ordered   07/25/22 1557  For home use only DME Walker rolling  Once       Question Answer Comment  Walker: With 5 Inch Wheels   Patient needs a walker to treat with the following condition  Difficulty in walking, not elsewhere classified      07/25/22 1556              Discharge Care Instructions  (From admission, onward)           Start     Ordered   07/26/22 0000  Touch down weight bearing       Question Answer Comment  Laterality left   Extremity Lower      07/26/22 1126            Diagnostic Studies: DG MINI C-ARM IMAGE ONLY  Result Date: 07/25/2022 There is no interpretation  for this exam.  This order is for images obtained during a surgical procedure.  Please See "Surgeries" Tab for more information regarding the procedure.    Patient benefited maximally from their hospital stay and there were no complications.     Disposition: Discharge disposition: 01-Home or Self Care      Discharge Instructions     Call MD / Call 911   Complete by: As directed    If you experience chest pain or shortness of breath, CALL 911 and be transported to the hospital emergency room.  If you develope a fever above 101 F, pus (white drainage) or increased drainage or redness at the wound, or calf pain, call your surgeon's office.   Call MD / Call 911   Complete by: As directed    If you experience chest pain or shortness of breath, CALL 911 and be transported to the hospital emergency room.  If you develope a fever above 101 F, pus (white drainage) or increased drainage or redness at the wound, or calf pain, call your surgeon's office.   Constipation Prevention   Complete by: As directed    Drink plenty of fluids.  Prune juice may be helpful.  You may use a stool softener, such as Colace (over the counter) 100 mg twice a day.  Use MiraLax (over the counter) for constipation as needed.   Constipation Prevention   Complete by: As directed    Drink plenty of fluids.  Prune juice may be helpful.  You may use a stool softener, such as Colace (over the counter) 100 mg twice a day.  Use MiraLax (over the counter) for constipation as needed.   Diet - low sodium heart  healthy   Complete by: As directed    Diet - low sodium heart healthy   Complete by: As directed    Increase activity slowly as tolerated   Complete by: As directed    Increase activity slowly as tolerated   Complete by: As directed    Post-operative opioid taper instructions:   Complete by: As directed    POST-OPERATIVE OPIOID TAPER INSTRUCTIONS: It is important to wean off of your opioid medication as soon as possible. If you do not need pain medication after your surgery it is ok to stop day one. Opioids include: Codeine, Hydrocodone(Norco, Vicodin), Oxycodone(Percocet, oxycontin) and hydromorphone amongst others.  Long term and even short term use of opiods can cause: Increased pain response Dependence Constipation Depression Respiratory depression And more.  Withdrawal symptoms can include Flu like symptoms Nausea, vomiting And more Techniques to manage these symptoms Hydrate well Eat regular healthy meals Stay active Use relaxation techniques(deep breathing, meditating, yoga) Do Not substitute Alcohol to help with tapering If you have been on opioids for less than two weeks and do not have pain than it is ok to stop all together.  Plan to wean off of opioids This plan should start within one week post op of your joint replacement. Maintain the same interval or time between taking each dose and first decrease the dose.  Cut the total daily intake of opioids by one tablet each day Next start to increase the time between doses. The last dose that should be eliminated is the evening dose.      Post-operative opioid taper instructions:   Complete by: As directed    POST-OPERATIVE OPIOID TAPER INSTRUCTIONS: It is important to wean off of your opioid medication as soon as possible. If you do not need pain  medication after your surgery it is ok to stop day one. Opioids include: Codeine, Hydrocodone(Norco, Vicodin), Oxycodone(Percocet, oxycontin) and hydromorphone amongst  others.  Long term and even short term use of opiods can cause: Increased pain response Dependence Constipation Depression Respiratory depression And more.  Withdrawal symptoms can include Flu like symptoms Nausea, vomiting And more Techniques to manage these symptoms Hydrate well Eat regular healthy meals Stay active Use relaxation techniques(deep breathing, meditating, yoga) Do Not substitute Alcohol to help with tapering If you have been on opioids for less than two weeks and do not have pain than it is ok to stop all together.  Plan to wean off of opioids This plan should start within one week post op of your joint replacement. Maintain the same interval or time between taking each dose and first decrease the dose.  Cut the total daily intake of opioids by one tablet each day Next start to increase the time between doses. The last dose that should be eliminated is the evening dose.      Touch down weight bearing   Complete by: As directed    Laterality: left   Extremity: Lower       Follow-up Information     Newt Minion, MD Follow up in 1 week(s).   Specialty: Orthopedic Surgery Contact information: [address] [name] [address] [phone]                  Signed: Newt Minion 07/26/2022, 11:27 AM

## 2022-07-26 NOTE — Progress Notes (Signed)
Physical Therapy Treatment Patient Details Name: Melissa Jordan MRN: [id number] DOB: 1946-04-01 Today's Date: 07/26/2022   History of Present Illness Patient is a 77 y/o female who presents with posterior tibial tendon insufficiency now s/p talonavicular and subtalar fusion 2/2. PMH includes Bil TKAs, back surgeries, HTN, prior n/v and soft BP post surgery.    PT Comments    Pt feeling better today and did not get dizzy or nauseous, did received zophran during session. Pt able to transfer surface to surface with RW with supervision as well as hop a few feet maintaining TDWB. Pt does not have sensation RLE yet. Is unable to get heel all the way into CAM boot. Gentle stretching performed but was conservative with this since pt does not have sensation. Practiced LE there ex for BLE. Pt ready for d/c home. Recommend outpt PT when pt begins to bear more weight LLE.     Recommendations for follow up therapy are one component of a multi-disciplinary discharge planning process, led by the attending physician.  Recommendations may be updated based on patient status, additional functional criteria and insurance authorization.  Follow Up Recommendations  Outpatient PT (when allowed to bear weight)     Assistance Recommended at Discharge Intermittent Supervision/Assistance  Patient can return home with the following A little help with walking and/or transfers;A little help with bathing/dressing/bathroom;Assist for transportation;Assistance with cooking/housework   Equipment Recommendations  Rolling walker (2 wheels)    Recommendations for Other Services       Precautions / Restrictions Precautions Precautions: Fall Required Braces or Orthoses: Other Brace Other Brace: CAM boot Restrictions Weight Bearing Restrictions: Yes LLE Weight Bearing: Touchdown weight bearing     Mobility  Bed Mobility               General bed mobility comments: independent    Transfers Overall transfer  level: Needs assistance Equipment used: Rolling walker (2 wheels) Transfers: Bed to chair/wheelchair/BSC, Sit to/from Stand Sit to Stand: Supervision   Step pivot transfers: Supervision       General transfer comment: pt able to stand to RW without assist. Practiced pivoting to R and L with RW as well as discussing low squat pivot to R without RW to [facility] when needing to perform quickly    Ambulation/Gait Ambulation/Gait assistance: Supervision Gait Distance (Feet): 2 Feet Assistive device: Rolling walker (2 wheels) Gait Pattern/deviations: Step-to pattern       General Gait Details: able to take small steps keeping TDWB. Advised if distance is greater than 3-5' to use RW   Stairs             Wheelchair Mobility    Modified Rankin (Stroke Patients Only)       Balance Overall balance assessment: Needs assistance Sitting-balance support: Feet supported, No upper extremity supported Sitting balance-Leahy Scale: Good     Standing balance support: Single extremity supported Standing balance-Leahy Scale: Fair                              Cognition Arousal/Alertness: Awake/alert Behavior During Therapy: WFL for tasks assessed/performed Overall Cognitive Status: Within Functional Limits for tasks assessed                                          Exercises Other Exercises Other Exercises: L ankle df stretch with strap  x1 min (gentle since pt does not have sensation back yet)    General Comments General comments (skin integrity, edema, etc.): reviewed standing, sitting and supine exercises for both LE's. Discussed car transfer. Discussed activity level upon return home and elevation/ ice. rest      Pertinent Vitals/Pain Pain Assessment Pain Assessment: No/denies pain    Home Living                          Prior Function            PT Goals (current goals can now be found in the care plan section) Acute Rehab PT  Goals Patient Stated Goal: to go home and get back to independence PT Goal Formulation: With patient Time For Goal Achievement: 08/08/22 Potential to Achieve Goals: Good Progress towards PT goals: Progressing toward goals    Frequency    Min 3X/week      PT Plan Discharge plan needs to be updated    Co-evaluation              AM-PAC PT "6 Clicks" Mobility   Outcome Measure  Help needed turning from your back to your side while in a flat bed without using bedrails?: None Help needed moving from lying on your back to sitting on the side of a flat bed without using bedrails?: None Help needed moving to and from a bed to a chair (including a wheelchair)?: A Little Help needed standing up from a chair using your arms (e.g., wheelchair or bedside chair)?: A Little Help needed to walk in hospital room?: A Little Help needed climbing 3-5 steps with a railing? : Total 6 Click Score: 18    End of Session Equipment Utilized During Treatment: Gait belt Activity Tolerance: Patient tolerated treatment well Patient left: in chair;with call bell/phone within reach Nurse Communication: Mobility status PT Visit Diagnosis: Difficulty in walking, not elsewhere classified (R26.2);Unsteadiness on feet (R26.81)     Time: 1000-1030 PT Time Calculation (min) (ACUTE ONLY): 30 min  Charges:  $Gait Training: 8-22 mins $Therapeutic Exercise: 8-22 mins                     Leighton Roach, PT  Acute Rehab Services Secure chat preferred Office [place] 07/26/2022, 10:46 AM

## 2022-07-26 NOTE — TOC Transition Note (Signed)
Transition of Care [facility]) - CM/SW Discharge Note   Patient Details  Name: Melissa Jordan MRN: [id number] Date of Birth: [birth year]  [facility]) CM/SW Contact:  Tom-Johnson, Renea Ee, RN Phone Number: 07/26/2022, 2:13 PM   Clinical Narrative:     Patient is scheduled for discharge today. Outpatient PT recommended when patient can bear weight. Referral will be made by MD.  RW ordered from [place] delivered to patient at bedside.  Husband to transport at discharge. No further TOC needs noted.       Final next level of care: OP Rehab (When allowed to bear weight.) Barriers to Discharge: Barriers Resolved   Patient Goals and CMS Choice CMS Medicare.gov Compare Post Acute Care list provided to:: Patient Choice offered to / list presented to : Patient, Spouse  Discharge Placement                  Patient to be transferred to facility by: Husband      Discharge Plan and Services Additional resources added to the After Visit Summary for                  DME Arranged: Walker rolling DME Agency: [employer] Date DME Agency Contacted: 07/26/22 Time DME Agency Contacted: 1200 Representative spoke with at [place]: [place]: [place]: NA        Social Determinants of Health ([place]) Interventions [place]: No Food Insecurity (07/25/2022)  Housing: Low Risk  (07/25/2022)  Transportation Needs: No Transportation Needs (07/25/2022)  Utilities: Not At Risk (07/25/2022)  Alcohol Screen: Low Risk  (05/07/2022)  Depression (PHQ2-9): Low Risk  (05/07/2022)  Financial Resource Strain: Low Risk  (05/07/2022)  Physical Activity: Insufficiently Active (05/07/2022)  Social Connections: Moderately Integrated (05/07/2022)  Stress: No Stress Concern Present (05/07/2022)  Tobacco Use: Low Risk  (07/25/2022)     Readmission Risk Interventions    03/21/2022   10:30 AM  Readmission Risk Prevention Plan  Post Dischage  Appt Complete  Medication Screening Complete  Transportation Screening Complete

## 2022-07-31 ENCOUNTER — Encounter (INDEPENDENT_AMBULATORY_CARE_PROVIDER_SITE_OTHER): Payer: Medicare Other | Admitting: Family Medicine

## 2022-07-31 DIAGNOSIS — N3000 Acute cystitis without hematuria: Secondary | ICD-10-CM

## 2022-07-31 MED ORDER — CEPHALEXIN 500 MG PO CAPS: 500.0000 mg | ORAL_CAPSULE | Freq: Two times a day (BID) | ORAL | 0 refills | Status: AC

## 2022-07-31 NOTE — Telephone Encounter (Signed)
[facility] VISIT   Patient agreed to [facility] visit and is aware that copayment and coinsurance may apply. Patient was treated using telemedicine according to accepted telemedicine protocols.  Subjective:   Patient complains of burning w/ urination, frequency, urgency  Patient Active Problem List   Diagnosis Date Noted   Posterior tibial tendon dysfunction (PTTD) of left lower extremity 07/25/2022   Atrial fibrillation ([place]) 03/20/2022   Presence of Watchman left atrial appendage closure device 03/20/2022   Spinal cord stimulator status 10/04/2020   Elevated blood-pressure reading, without diagnosis of hypertension 08/07/2020   Chronic pain syndrome 02/15/2020   History of lumbosacral spine surgery 02/15/2020   S/P total knee arthroplasty, right 09/29/2019   Sacroiliitis ([place]) 08/09/2019   Body mass index (BMI) 27.0-27.9, adult 05/17/2019   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   Traumatic arthritis of right ankle    Pain in right ankle and joints of right foot 08/05/2018   A-fib ([place]) 03/10/2017   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/2016   RLS (restless legs syndrome) 01/11/2015   Osteopenia 05/15/2014   Hyperlipidemia 05/24/2013   Routine general medical examination at a health care facility 03/29/2012   POSTMENOPAUSAL SYNDROME 09/26/2009   URINARY URGENCY 09/26/2009   ANEMIA 01/09/2009   Back pain of lumbar region with sciatica 01/09/2009   LAMINECTOMY, LUMBAR, HX OF 08/09/2008   Hypothyroid 03/03/2007   HYPERTENSION, BENIGN 03/03/2007   GERD 02/10/2007   Social History   Tobacco Use   Smoking status: Never   Smokeless tobacco: Never  Substance Use Topics   Alcohol use: No    Current Outpatient Medications:    cephALEXin (KEFLEX) 500 MG capsule, Take 1 capsule (500 mg total) by mouth 2 (two) times daily for 10 doses., Disp: 10 capsule, Rfl: 0   acetaminophen (TYLENOL) 500 MG tablet, Take 500-1,000 mg by mouth every 6 (six) hours as needed for moderate pain., Disp: ,  Rfl:    albuterol (VENTOLIN HFA) 108 (90 Base) MCG/ACT inhaler, Inhale 2 puffs into the lungs every 6 (six) hours as needed for wheezing or shortness of breath. (Patient not taking: Reported on 07/23/2022), Disp: 8 g, Rfl: 0   azelastine (ASTELIN) 0.1 % nasal spray, Place 1 spray into both nostrils 2 (two) times daily. Use in each nostril as directed (Patient taking differently: Place 1 spray into both nostrils 2 (two) times daily as needed (nasal congestion.). Use in each nostril as directed), Disp: 30 mL, Rfl: 12   Calcium Carb-Cholecalciferol (CALCIUM 600 + D PO), Take 1 tablet by mouth at bedtime., Disp: , Rfl:    Cholecalciferol (VITAMIN D) 50 MCG (2000 UT) tablet, Take 3,000 Units by mouth daily., Disp: , Rfl:    clopidogrel (PLAVIX) 75 MG tablet, Take 1 tablet (75 mg total) by mouth daily., Disp: 90 tablet, Rfl: 1   doxycycline (VIBRA-TABS) 100 MG tablet, Take 1 tablet (100 mg total) by mouth 2 (two) times daily. (Patient not taking: Reported on 07/23/2022), Disp: 20 tablet, Rfl: 0   estradiol (ESTRACE) 1 MG tablet, TAKE 1 TABLET BY MOUTH  DAILY, Disp: 100 tablet, Rfl: 2   levothyroxine (SYNTHROID) 25 MCG tablet, Take 1 tablet (25 mcg total) by mouth daily. (Patient taking differently: Take 25 mcg by mouth See admin instructions. Take 1 tablet (25 mcg) by mouth on Mondays- Fridays. Take 2 tablets (50 mcg) by mouth on Saturdays & Sundays.), Disp: 30 tablet, Rfl: 3   loratadine (CLARITIN) 10 MG tablet, Take 10 mg by mouth in the morning., Disp: ,  Rfl:    magnesium oxide (MAG-OX) 400 MG tablet, Take 400 mg by mouth at bedtime., Disp: , Rfl:    metoprolol succinate (TOPROL XL) 25 MG 24 hr tablet, Take 1 tablet (25 mg total) by mouth daily. (Patient taking differently: Take 25 mg by mouth at bedtime.), Disp: 90 tablet, Rfl: 3   mometasone (NASONEX) 50 MCG/ACT nasal spray, Place 2 sprays into the nose daily as needed (congestion)., Disp: , Rfl:    oxyCODONE-acetaminophen (PERCOCET/ROXICET) 5-325 MG  tablet, Take 1 tablet by mouth every 4 (four) hours as needed., Disp: 30 tablet, Rfl: 0   Polyethyl Glycol-Propyl Glycol (SYSTANE) 0.4-0.3 % SOLN, Place 1 drop into both eyes daily., Disp: , Rfl:    promethazine-dextromethorphan (PROMETHAZINE-DM) 6.25-15 MG/5ML syrup, Take 5 mLs by mouth 4 (four) times daily as needed. (Patient not taking: Reported on 07/23/2022), Disp: 240 mL, Rfl: 0   rOPINIRole (REQUIP) 3 MG tablet, Take 1 tablet (3 mg total) by mouth at bedtime., Disp: 90 tablet, Rfl: 1   traMADol (ULTRAM) 50 MG tablet, Take 50 mg by mouth every 6 (six) hours as needed for pain., Disp: , Rfl:    triamcinolone ointment (KENALOG) 0.1 %, Apply 1 Application topically 2 (two) times daily. (Patient taking differently: Apply 1 Application topically 2 (two) times daily as needed (skin irritation/rash--allergic reaction.).), Disp: 90 g, Rfl: 1  Allergies  Allergen Reactions   Lyrica [Pregabalin] Palpitations and Other (See Comments)    "thought I was going to have a heart attack; heart started pounding so hard, I couldn't catch my breath" (06/28/2012)   Meclizine Other (See Comments)    "what was in my mind to say wasn't what came out to say; I was kind of in another world" (06/28/2012)   Penicillins Itching and Rash    Has patient had a PCN reaction causing immediate rash, facial/tongue/throat swelling, SOB or lightheadedness with hypotension: No Has patient had a PCN reaction causing severe rash involving mucus membranes or skin necrosis: No Has patient had a PCN reaction that required hospitalization No Has patient had a PCN reaction occurring within the last 10 years:   # # YES # #  If all of the above answers are "NO", then may proceed with Cephalosporin use.  Has tolerated cefazolin (2020, 2023)    Poison [place]ac Extract Swelling, Rash and Other (See Comments)    Blisters   Propoxyphene Nausea And Vomiting   Bactrim [Sulfamethoxazole-Trimethoprim] Rash   Codeine Nausea Only    Reaction only in  pill form   Morphine Sulfate Rash   Propoxyphene N-Acetaminophen Nausea And Vomiting    "Darvocet"   Vancomycin Rash    Assessment and Plan:   Diagnosis: UTI. Please see myChart communication and orders below.   No orders of the defined types were placed in this encounter.  Meds ordered this encounter  Medications   cephALEXin (KEFLEX) 500 MG capsule    Sig: Take 1 capsule (500 mg total) by mouth 2 (two) times daily for 10 doses.    Dispense:  10 capsule    Refill:  0    Annye Asa, MD 07/31/2022  A total of 5 minutes were spent by me to personally review the patient-generated inquiry, review patient records and data pertinent to assessment of the patient's problem, develop a management plan including generation of prescriptions and/or orders, and on subsequent communication with the patient through secure the MyChart portal service.   There is no separately reported E/M service related to this service  in the past 7 days nor does the patient have an upcoming soonest available appointment for this issue. This work was completed in less than 7 days.   The patient consented to this service today (see patient agreement prior to ongoing communication). Patient counseled regarding the need for in-person exam for certain conditions and was advised to call the office if any changing or worsening symptoms occur.   The codes to be used for the E/M service are: [x]   99421 for 5-10 minutes of time spent on the inquiry. []   [id number] for 11-20 minutes. []   [id number] for 21+ minutes.

## 2022-08-01 ENCOUNTER — Ambulatory Visit (INDEPENDENT_AMBULATORY_CARE_PROVIDER_SITE_OTHER): Payer: Medicare Other | Admitting: Family

## 2022-08-01 ENCOUNTER — Encounter: Payer: Self-pay | Admitting: Family

## 2022-08-01 DIAGNOSIS — M76822 Posterior tibial tendinitis, left leg: Secondary | ICD-10-CM

## 2022-08-01 MED ORDER — METHOCARBAMOL 500 MG PO TABS: 500.0000 mg | ORAL_TABLET | Freq: Four times a day (QID) | ORAL | 0 refills | Status: DC | PRN

## 2022-08-01 MED ORDER — OXYCODONE-ACETAMINOPHEN 5-325 MG PO TABS: 1.0000 | ORAL_TABLET | ORAL | 0 refills | Status: DC | PRN
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
## 2022-08-07 ENCOUNTER — Ambulatory Visit (INDEPENDENT_AMBULATORY_CARE_PROVIDER_SITE_OTHER): Payer: Medicare Other | Admitting: Family Medicine

## 2022-08-07 ENCOUNTER — Encounter: Payer: Self-pay | Admitting: Family Medicine

## 2022-08-07 VITALS — BP 124/76 | HR 97 | Temp 99.0°F | Resp 16

## 2022-08-07 DIAGNOSIS — Z23 Encounter for immunization: Secondary | ICD-10-CM

## 2022-08-07 DIAGNOSIS — L258 Unspecified contact dermatitis due to other agents: Secondary | ICD-10-CM

## 2022-08-07 MED ORDER — BETAMETHASONE DIPROPIONATE AUG 0.05 % EX OINT: TOPICAL_OINTMENT | Freq: Two times a day (BID) | CUTANEOUS | 0 refills | Status: DC

## 2022-08-07 MED ORDER — FAMOTIDINE 20 MG PO TABS: 20.0000 mg | ORAL_TABLET | Freq: Two times a day (BID) | ORAL | 1 refills | Status: DC

## 2022-08-07 NOTE — Progress Notes (Signed)
   Subjective:    Patient ID: Melissa Jordan, female    DOB: 09-08-1945, 77 y.o.   MRN: [id number]  HPI Rash- L side of face, 'it's like a blister'.  Started a few days ago.  Denies burning or pain, very itchy.  Yesterday noticed enlarged LN's.  Has been using Triamcinolone ointment w/ minimal relief.  Taking Claritin daily.  Was d/c'd from hospital just prior to rash erupting   Review of Systems For ROS see HPI     Objective:   Physical Exam Vitals reviewed.  Constitutional:      General: She is not in acute distress.    Appearance: Normal appearance. She is not ill-appearing.  HENT:     Head: Normocephalic and atraumatic.     Right Ear: Tympanic membrane and ear canal normal.     Left Ear: Tympanic membrane and ear canal normal. Swelling (and redness of L lobe) present.  Eyes:     Extraocular Movements: Extraocular movements intact.     Conjunctiva/sclera: Conjunctivae normal.     Pupils: Pupils are equal, round, and reactive to light.  Lymphadenopathy:     Cervical: Cervical adenopathy present.  Skin:    General: Skin is warm and dry.     Findings: Rash (excoriated areas on L side of neck and behind ear consistent w/ O2 tubing contact) present.  Neurological:     General: No focal deficit present.     Mental Status: She is alert and oriented to person, place, and time.  Psychiatric:        Mood and Affect: Mood normal.        Behavior: Behavior normal.        Thought Content: Thought content normal.           Assessment & Plan:  Contact dermatitis- new.  Pt's rash was initially concerning for shingles but appears to be a very itchy contact dermatitis from O2 tubing.  Start Diprolene rather than Triamcinolone to increase potency of steroid.  Pt to add Pepcid to her daily antihistamine to help w/ itching.  Reviewed supportive care and red flags that should prompt return.  Pt expressed understanding and is in agreement w/ plan.

## 2022-08-07 NOTE — Patient Instructions (Addendum)
Follow up as needed or as scheduled APPLY the Betamethasone twice daily to the itchy areas CONTINUE the Claritin daily ADD the Pepcid (Famotidine) twice daily to help w/ itching Call with any questions or concerns Hang in there!

## 2022-08-08 ENCOUNTER — Ambulatory Visit (INDEPENDENT_AMBULATORY_CARE_PROVIDER_SITE_OTHER): Payer: Medicare Other | Admitting: Family

## 2022-08-08 ENCOUNTER — Encounter: Payer: Self-pay | Admitting: Family

## 2022-08-08 ENCOUNTER — Ambulatory Visit: Payer: Self-pay

## 2022-08-08 DIAGNOSIS — M76822 Posterior tibial tendinitis, left leg: Secondary | ICD-10-CM
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
## 2022-08-11 ENCOUNTER — Telehealth: Payer: Self-pay | Admitting: Family Medicine

## 2022-08-11 NOTE — Telephone Encounter (Signed)
Pt states she tested positive for COVID this morning .has a headache terrible per pt . Has some congestion . Dr Birdie Riddle suggest a virtual visit will schedule

## 2022-08-11 NOTE — Telephone Encounter (Signed)
Caller name: LAWRYN SUN  On DPR?: Yes  Call back number: [phone] (mobile)  Provider they see: Midge Minium, MD  Reason for call:Sharmaine and her husband have tested Covid + and would like advise

## 2022-08-12 ENCOUNTER — Telehealth (INDEPENDENT_AMBULATORY_CARE_PROVIDER_SITE_OTHER): Payer: Medicare Other | Admitting: Family Medicine

## 2022-08-12 ENCOUNTER — Encounter: Payer: Self-pay | Admitting: Family Medicine

## 2022-08-12 ENCOUNTER — Encounter: Payer: Self-pay | Admitting: Orthopedic Surgery

## 2022-08-12 DIAGNOSIS — U071 COVID-19: Secondary | ICD-10-CM | POA: Diagnosis not present

## 2022-08-12 MED ORDER — NIRMATRELVIR/RITONAVIR (PAXLOVID)TABLET: 3.0000 | ORAL_TABLET | Freq: Two times a day (BID) | ORAL | 0 refills | Status: AC

## 2022-08-12 NOTE — Progress Notes (Signed)
Virtual Visit via Video   I connected with patient on 08/12/22 at  8:00 AM EST by a video enabled telemedicine application and verified that I am speaking with the correct person using two identifiers.  Location patient: Home Location provider: [facility], Office Persons participating in the virtual visit: Patient, Provider, [place] Marcille Blanco C)  I discussed the limitations of evaluation and management by telemedicine and the availability of in person appointments. The patient expressed understanding and agreed to proceed.  Subjective:   HPI:   COVID- pt tested + yesterday after developing bad HA.  'i just feel like I got a really bad head cold'.  + severe HA.  + congestion.  No fever.  Mild cough.  Denies body aches.  + sick contacts.  No SOB.    ROS:   See pertinent positives and negatives per HPI.  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   Traumatic arthritis of right ankle    Pain in right ankle and joints of right foot 08/05/2018   A-fib ([place]) 03/10/2017   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/2016   RLS (restless legs syndrome) 01/11/2015   Osteopenia 05/15/2014   Hyperlipidemia 05/24/2013   Routine general medical examination at a health care facility 03/29/2012   POSTMENOPAUSAL SYNDROME 09/26/2009   URINARY URGENCY 09/26/2009   ANEMIA 01/09/2009   Back pain of lumbar region with  sciatica 01/09/2009   LAMINECTOMY, LUMBAR, HX OF 08/09/2008   Hypothyroid 03/03/2007   HYPERTENSION, BENIGN 03/03/2007    Social History   Tobacco Use   Smoking status: Never   Smokeless tobacco: Never  Substance Use Topics   Alcohol use: No    Current Outpatient Medications:    albuterol (VENTOLIN HFA) 108 (90 Base) MCG/ACT inhaler, Inhale 2 puffs into the lungs every 6 (six) hours as needed for wheezing or shortness of breath., Disp: 8 g, Rfl: 0   augmented betamethasone dipropionate (DIPROLENE) 0.05 % ointment, Apply topically 2 (two) times daily., Disp: 45 g, Rfl: 0   azelastine (ASTELIN) 0.1 % nasal spray, Place 1 spray into both nostrils 2 (two) times daily. Use in each nostril as directed (Patient taking differently: Place 1 spray into both nostrils 2 (two) times daily as needed (nasal congestion.). Use in each nostril as directed), Disp: 30 mL, Rfl: 12   Calcium Carb-Cholecalciferol (CALCIUM 600 + D PO), Take 1 tablet by mouth at bedtime., Disp: , Rfl:    Cholecalciferol (VITAMIN D) 50 MCG (2000 UT) tablet, Take 3,000 Units by mouth daily., Disp: , Rfl:    clopidogrel (PLAVIX) 75 MG tablet, Take 1 tablet (75 mg total) by mouth daily., Disp: 90 tablet, Rfl: 1   estradiol (ESTRACE) 1 MG tablet, TAKE 1 TABLET BY MOUTH  DAILY, Disp: 100 tablet, Rfl: 2   famotidine (PEPCID) 20 MG tablet, Take 1 tablet (20 mg total) by mouth 2 (two) times daily., Disp: 60 tablet, Rfl: 1   levothyroxine (SYNTHROID) 25 MCG tablet, Take 1  tablet (25 mcg total) by mouth daily. (Patient taking differently: Take 25 mcg by mouth See admin instructions. Take 1 tablet (25 mcg) by mouth on Mondays- Fridays. Take 2 tablets (50 mcg) by mouth on Saturdays & Sundays.), Disp: 30 tablet, Rfl: 3   loratadine (CLARITIN) 10 MG tablet, Take 10 mg by mouth in the morning., Disp: , Rfl:    magnesium oxide (MAG-OX) 400 MG tablet, Take 400 mg by mouth at bedtime., Disp: , Rfl:    methocarbamol (ROBAXIN) 500 MG tablet, Take 1  tablet (500 mg total) by mouth every 6 (six) hours as needed for muscle spasms., Disp: 30 tablet, Rfl: 0   metoprolol succinate (TOPROL XL) 25 MG 24 hr tablet, Take 1 tablet (25 mg total) by mouth daily. (Patient taking differently: Take 25 mg by mouth at bedtime.), Disp: 90 tablet, Rfl: 3   mometasone (NASONEX) 50 MCG/ACT nasal spray, Place 2 sprays into the nose daily as needed (congestion)., Disp: , Rfl:    oxyCODONE-acetaminophen (PERCOCET/ROXICET) 5-325 MG tablet, Take 1 tablet by mouth every 4 (four) hours as needed., Disp: 42 tablet, Rfl: 0   Polyethyl Glycol-Propyl Glycol (SYSTANE) 0.4-0.3 % SOLN, Place 1 drop into both eyes daily., Disp: , Rfl:    rOPINIRole (REQUIP) 3 MG tablet, Take 1 tablet (3 mg total) by mouth at bedtime., Disp: 90 tablet, Rfl: 1   losartan (COZAAR) 50 MG tablet, Take 50 mg by mouth daily. (Patient not taking: Reported on 08/12/2022), Disp: , Rfl:    triamcinolone ointment (KENALOG) 0.1 %, Apply 1 Application topically 2 (two) times daily. (Patient not taking: Reported on 08/12/2022), Disp: 90 g, Rfl: 1  Allergies  Allergen Reactions   Lyrica [Pregabalin] Palpitations and Other (See Comments)    "thought I was going to have a heart attack; heart started pounding so hard, I couldn't catch my breath" (06/28/2012)   Meclizine Other (See Comments)    "what was in my mind to say wasn't what came out to say; I was kind of in another world" (06/28/2012)   Penicillins Itching and Rash    Has patient had a PCN reaction causing immediate rash, facial/tongue/throat swelling, SOB or lightheadedness with hypotension: No Has patient had a PCN reaction causing severe rash involving mucus membranes or skin necrosis: No Has patient had a PCN reaction that required hospitalization No Has patient had a PCN reaction occurring within the last 10 years:   # # YES # #  If all of the above answers are "NO", then may proceed with Cephalosporin use.  Has tolerated cefazolin (2020, 2023)     Poison [place] Extract Swelling, Rash and Other (See Comments)    Blisters   Propoxyphene Nausea And Vomiting   Bactrim [Sulfamethoxazole-Trimethoprim] Rash   Codeine Nausea Only    Reaction only in pill form   Morphine Sulfate Rash   Propoxyphene N-Acetaminophen Nausea And Vomiting    "Darvocet"   Vancomycin Rash    Objective:   There were no vitals taken for this visit. AAOx3, NAD NCAT, EOMI No obvious CN deficits Coloring WNL Pt is able to speak clearly, coherently without shortness of breath or increased work of breathing. + dry cough Thought process is linear.  Mood is appropriate.   Assessment and Plan:   COVID- pt reports sxs are not as severe as the last time she had COVID but her headache, congestion, and cough are bothersome.  Will start antivirals as she just developed sxs yesterday.  Reviewed supportive care and red  flags that should prompt return.  Pt expressed understanding and is in agreement w/ plan.    Annye Asa, MD 08/12/2022

## 2022-08-13 ENCOUNTER — Telehealth: Payer: Self-pay | Admitting: Family Medicine

## 2022-08-13 ENCOUNTER — Encounter: Payer: Self-pay | Admitting: Family

## 2022-08-13 ENCOUNTER — Ambulatory Visit (INDEPENDENT_AMBULATORY_CARE_PROVIDER_SITE_OTHER): Payer: Medicare Other | Admitting: Family

## 2022-08-13 DIAGNOSIS — M76822 Posterior tibial tendinitis, left leg: Secondary | ICD-10-CM

## 2022-08-13 MED ORDER — DOXYCYCLINE HYCLATE 100 MG PO TABS: 100.0000 mg | ORAL_TABLET | Freq: Two times a day (BID) | ORAL | 0 refills | Status: DC

## 2022-08-13 NOTE — Telephone Encounter (Signed)
I am aware that Paxlovid can increase the concentration/effects of Percocet in her system.  Since she is on a low dose of Percocet (5 mg) I was not overly concerned about this interaction.  She has a few options-  1) hold the percocet while on Paxlovid 2) decrease percocet to 1/2 tab 3) not take the Paxlovid and let COVID run its course

## 2022-08-13 NOTE — Telephone Encounter (Signed)
Caller name: [place]   On DPR?: Yes  Call back number: [phone]  Provider they see: Midge Minium, MD  Reason for call: nirmatrelvir/ritonavir (PAXLOVID) 20 x 150 MG & 10 x 100MG TABS  is interacting with oxyCODONE-acetaminophen (PERCOCET/ROXICET) 5-325 MG tablet  and possibly  clopidogrel (PLAVIX) 75 MG tablet

## 2022-08-13 NOTE — Telephone Encounter (Signed)
Initial Comment Caller states he is calling from [facility] and is needing to speak to an MD regarding an interaction for a RX. Pharmacy Name [facility] Pharmacist Name [place] Number [phone] Translation No Nurse Assessment Nurse: Rayburn Felt RN, [place] Date/Time Eilene Ghazi Time): 08/12/2022 5:07:47 PM Confirm and document reason for call. If symptomatic, describe symptoms. ---Caller Pharmacist Eddie Dibbles with [facility] stated that patient was prescribed Paxlovid. Pharmacist Eddie Dibbles stated that patient is on percocet 5 since Feb 9th post surgery. Caller stated that the percocet and paxlovid have interactions and he needed to speak with provider. Does the patient have any new or worsening symptoms? ---Yes Will a triage be completed? ---No Select reason for no triage. ---Other Disp. Time Eilene Ghazi Time) Disposition Final User 08/12/2022 5:11:57 PM Clinical Call Yes Rayburn Felt, RN, [place] Final Disposition 08/12/2022 5:11:57 PM Clinical Call Yes [place], RN, [place] PLEASE NOTE: All timestamps contained within this report are represented as [place] Standard Time. CONFIDENTIALTY NOTICE: This fax transmission is intended only for the addressee. It contains information that is legally privileged, confidential or otherwise protected from use or disclosure. If you are not the intended recipient, you are strictly prohibited from reviewing, disclosing, copying using or disseminating any of this information or taking any action in reliance on or regarding this information. If you have received this fax in error, please notify us immediately by telephone so that we can arrange for its return to [place]. Phone: [phone], Toll-Free: [phone], Fax: [phone] Page: 2 of 2 Call Id: [id number] Comments User: Elmer Picker, RN Date/Time Eilene Ghazi Time): 08/12/2022 5:11:17 PM Pharmacist Eddie Dibbles informed (per directives) to call office tomorrow during business hours. Caller verbalized  understanding.

## 2022-08-13 NOTE — Telephone Encounter (Signed)
They are filling the Paxlovid . Pharmacist states that they do not have Molnupiravir any longer . I have made pt aware
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
## 2022-08-13 NOTE — Telephone Encounter (Signed)
Spoke to the pt and advised her of DR Birdie Riddle recommendation in regards to the Paxlovid and percocet. Pt states she will stop the Percocet while on Paxlovid

## 2022-08-13 NOTE — Telephone Encounter (Signed)
Pt was going to hold Percocet while on Paxlovid.  But rather than fight w/ pharmacy about all of the drug interactions, will switch to Molnupiravir 850m (4 pills) BID x5 days, #40, no refills

## 2022-08-13 NOTE — Telephone Encounter (Signed)
Pt on her way, I have her scheduled to see Erin.

## 2022-08-13 NOTE — Telephone Encounter (Signed)
Pt reports pharmacist denied filling PAXLOVID due to taking Oxy-Apap and Plavix, please advise

## 2022-08-14 ENCOUNTER — Encounter (HOSPITAL_COMMUNITY): Payer: Self-pay | Admitting: Orthopedic Surgery

## 2022-08-16 ENCOUNTER — Encounter: Payer: Self-pay | Admitting: Family Medicine

## 2022-08-25 ENCOUNTER — Ambulatory Visit (INDEPENDENT_AMBULATORY_CARE_PROVIDER_SITE_OTHER): Payer: Medicare Other | Admitting: Orthopedic Surgery

## 2022-08-25 ENCOUNTER — Encounter: Payer: Self-pay | Admitting: Orthopedic Surgery

## 2022-08-25 ENCOUNTER — Other Ambulatory Visit: Payer: Self-pay | Admitting: Family Medicine

## 2022-08-25 DIAGNOSIS — M76822 Posterior tibial tendinitis, left leg: Secondary | ICD-10-CM
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
## 2022-08-29 ENCOUNTER — Encounter: Payer: Self-pay | Admitting: Family Medicine

## 2022-08-29 ENCOUNTER — Ambulatory Visit (INDEPENDENT_AMBULATORY_CARE_PROVIDER_SITE_OTHER): Payer: Medicare Other | Admitting: Family Medicine

## 2022-08-29 VITALS — BP 140/78 | HR 78 | Temp 97.0°F | Resp 16

## 2022-08-29 DIAGNOSIS — B0089 Other herpesviral infection: Secondary | ICD-10-CM | POA: Diagnosis not present

## 2022-08-29 MED ORDER — VALACYCLOVIR HCL 1 G PO TABS: 1000.0000 mg | ORAL_TABLET | Freq: Three times a day (TID) | ORAL | 0 refills | Status: AC

## 2022-08-29 NOTE — Patient Instructions (Signed)
Follow up as needed or as scheduled START the Valtrex 3x/day for 7 days ICE the areas to help w/ itch and pain control Call with any questions or concerns Hang in there!!!

## 2022-08-29 NOTE — Progress Notes (Signed)
   Subjective:    Patient ID: Melissa Jordan, female    DOB: [birth year], 77 y.o.   MRN: [id number]  HPI Rash- pt reports she had 1-2 lesions prior to foot surgery.  She states they start small, get 'real big', 'appears to have a center to it', 'itches like crazy'.  Then burns.  And leaves a bruise.  Pt reports a 'tingle' prior to the lesion appearing.  Predominately on hands/arms but has one on L side of neck and L foot.  Sxs started 'weeks before I had COVID'.  Using triamcinolone ointment w/o relief   Review of Systems For ROS see HPI     Objective:   Physical Exam Vitals reviewed.  Constitutional:      General: She is not in acute distress.    Appearance: Normal appearance.  HENT:     Head: Normocephalic and atraumatic.  Skin:    General: Skin is warm and dry.     Findings: Rash (small cluster of vesicular lesions on L lateral lower neck, multiple vesicular lesions on hands and fingers) present.  Neurological:     General: No focal deficit present.     Mental Status: She is alert and oriented to person, place, and time.  Psychiatric:        Mood and Affect: Mood normal.        Behavior: Behavior normal.        Thought Content: Thought content normal.           Assessment & Plan:  Herpetic whitlow- new.  The lesions on patients fingers and hands are consistent w/ dx.  She does have a cluster of vesicular lesions on L lateral neck that appear to be herpetic.  Sxs are consistent- burning before lesion appears, then painful and itchy.  Start Valtrex TID.  If persistent problem, may need to consider daily suppressive dose.  Pt expressed understanding and is in agreement w/ plan.

## 2022-09-01 ENCOUNTER — Encounter: Payer: Self-pay | Admitting: Orthopedic Surgery

## 2022-09-01 ENCOUNTER — Ambulatory Visit (INDEPENDENT_AMBULATORY_CARE_PROVIDER_SITE_OTHER): Payer: Medicare Other | Admitting: Family Medicine

## 2022-09-01 ENCOUNTER — Encounter: Payer: Self-pay | Admitting: Family Medicine

## 2022-09-01 ENCOUNTER — Ambulatory Visit: Payer: Medicare Other | Admitting: Orthopedic Surgery

## 2022-09-01 ENCOUNTER — Ambulatory Visit (INDEPENDENT_AMBULATORY_CARE_PROVIDER_SITE_OTHER): Payer: Medicare Other | Admitting: Orthopedic Surgery

## 2022-09-01 VITALS — BP 120/84 | HR 80 | Temp 98.7°F | Resp 17

## 2022-09-01 DIAGNOSIS — R21 Rash and other nonspecific skin eruption: Secondary | ICD-10-CM

## 2022-09-01 DIAGNOSIS — M76822 Posterior tibial tendinitis, left leg: Secondary | ICD-10-CM

## 2022-09-01 MED ORDER — METHYLPREDNISOLONE ACETATE 80 MG/ML IJ SUSP
80.0000 mg | Freq: Once | INTRAMUSCULAR | Status: AC
  Administered 2022-09-01: 80 mg via INTRAMUSCULAR

## 2022-09-01 MED ORDER — PREDNISONE 10 MG PO TABS: ORAL_TABLET | ORAL | 0 refills | Status: DC

## 2022-09-01 NOTE — Progress Notes (Signed)
   Subjective:    Patient ID: Melissa Jordan, female    DOB: 1945/11/12, 77 y.o.   MRN: [id number]  HPI Rash- pt reports areas on hands have improved but she now has burning on L upper chest, under L arm, and along shoulder.  Has not slept for 2 nights due to the intense itching and burning.  Pt is taking Valtrex for herpetic whitlow.  This rash is not vesicular or in clusters.   Review of Systems For ROS see HPI     Objective:   Physical Exam Vitals reviewed.  Constitutional:      General: She is not in acute distress.    Appearance: Normal appearance.  HENT:     Head: Normocephalic and atraumatic.  Skin:    General: Skin is warm and dry.     Findings: Erythema and rash (rash is large erythematous papules on pt's L anterior chest, L shoulder, and L axilla.  no vesicles, no blisters, no clustered lesions) present.  Neurological:     General: No focal deficit present.     Mental Status: She is alert and oriented to person, place, and time.  Psychiatric:        Mood and Affect: Mood normal.        Behavior: Behavior normal.        Thought Content: Thought content normal.           Assessment & Plan:  Rash- new.  Pt is already on Valtrex for herpetic whitlow seen last week on hands.  She now has new rash that is not consistent w/ HSV but remains very itchy and will burn at times.  She is to continue topical triamcinolone but given the distribution will start Prednisone taper and refer to derm.  Pt expressed understanding and is in agreement w/ plan.

## 2022-09-01 NOTE — Patient Instructions (Addendum)
Follow up as needed or as scheduled START the Prednisone tomorrow as directed- take w/ food Apply the Triamcinolone ointment twice daily on the itchy areas Add Claritin or Zyrtec daily to help w/ itching CONTINUE the Valtrex 3x/day until meds are gone Call with any questions or concerns Hang in there!!!
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
## 2022-09-05 ENCOUNTER — Telehealth: Payer: Self-pay | Admitting: Adult Health

## 2022-09-05 ENCOUNTER — Encounter: Payer: Self-pay | Admitting: Cardiology

## 2022-09-05 NOTE — Telephone Encounter (Signed)
Pt advised to continue Plavix for 6 months after Watchman implant which was 03/20/22. She is close to the 6 month mark, would defer to EP to weigh in on if pt can stop her antiplatelet a bit early or if transitioning to a different antiplatelet for the last 2 weeks is warranted.

## 2022-09-05 NOTE — Telephone Encounter (Signed)
Error

## 2022-09-05 NOTE — Telephone Encounter (Signed)
Pt c/o medication issue:  1. Name of Medication: clopidogrel (PLAVIX) 75 MG tablet   2. How are you currently taking this medication (dosage and times per day)?   3. Are you having a reaction (difficulty breathing--STAT)?   4. What is your medication issue? Pt states she has a Itchy, red, welt on her and believes it is related to her taking the plavix. Please advise.

## 2022-09-05 NOTE — Telephone Encounter (Signed)
Patient reports after starting Plavix a few months back she started to break out in large welts, about the size of a quarter. She states they vary in location and will eventually break then heal then reappear in other locations. Currently she has one on her right ring finger, right elbow, right side of neck and right calf. She states they are extremely itching and sometimes painful. She has seen her PCP multiple times to try to treat them and antihistamines and anti viral medications have not helped.   After looking back at the timeline she believes it is the Plavix that is causing it. She wants to know if it is close to time for her to stop plavix or if there is another medication she can try.   I advised I would forward to her provider and Pharm D for review and we will follow up with her. Patient verbalized understanding and had no questions.

## 2022-09-08 MED ORDER — ASPIRIN 81 MG PO TBEC: 81.0000 mg | DELAYED_RELEASE_TABLET | Freq: Every day | ORAL | 3 refills | Status: DC

## 2022-09-08 NOTE — Telephone Encounter (Signed)
Spoke with the patient and advised that she could go ahead and stop plavix and start on aspirin 81 mg daily. Patient verbalized understanding.

## 2022-09-08 NOTE — Telephone Encounter (Signed)
Left message for patient to call back  

## 2022-09-14 ENCOUNTER — Encounter: Payer: Self-pay | Admitting: Orthopedic Surgery

## 2022-09-15 ENCOUNTER — Other Ambulatory Visit (INDEPENDENT_AMBULATORY_CARE_PROVIDER_SITE_OTHER): Payer: Medicare Other

## 2022-09-15 ENCOUNTER — Ambulatory Visit (INDEPENDENT_AMBULATORY_CARE_PROVIDER_SITE_OTHER): Payer: Medicare Other | Admitting: Orthopedic Surgery

## 2022-09-15 ENCOUNTER — Encounter: Payer: Self-pay | Admitting: Orthopedic Surgery

## 2022-09-15 DIAGNOSIS — M76822 Posterior tibial tendinitis, left leg: Secondary | ICD-10-CM
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
## 2022-09-16 ENCOUNTER — Inpatient Hospital Stay: Admission: RE | Admit: 2022-09-16 | Payer: Medicare Other | Source: Ambulatory Visit

## 2022-09-16 ENCOUNTER — Other Ambulatory Visit: Payer: Self-pay | Admitting: Student

## 2022-09-17 ENCOUNTER — Telehealth: Payer: Self-pay

## 2022-09-17 NOTE — Telephone Encounter (Signed)
Pt's medication was sent to pt's pharmacy as requested. Confirmation received.  °

## 2022-09-17 NOTE — Telephone Encounter (Signed)
The patient called to reschedule 6 month Watchman visit scheduled 09/22/22. She has already stopped Plavix and started ASA 81 mg qd.  She reports she is doing well. Instead of rescheduling 6 month LAAO visit, canceled that visit and scheduled her for next available visit with Dr. Gardiner Rhyme (she is overdue to see him) in July 2024. She was grateful for call and agreed with plan.

## 2022-09-22 ENCOUNTER — Ambulatory Visit: Payer: Medicare Other

## 2022-09-22 DIAGNOSIS — M8580 Other specified disorders of bone density and structure, unspecified site: Secondary | ICD-10-CM | POA: Diagnosis not present

## 2022-09-22 DIAGNOSIS — E039 Hypothyroidism, unspecified: Secondary | ICD-10-CM | POA: Diagnosis not present

## 2022-09-22 DIAGNOSIS — I10 Essential (primary) hypertension: Secondary | ICD-10-CM | POA: Diagnosis not present

## 2022-09-22 DIAGNOSIS — I482 Chronic atrial fibrillation, unspecified: Secondary | ICD-10-CM | POA: Diagnosis not present

## 2022-09-29 ENCOUNTER — Encounter: Payer: Self-pay | Admitting: Orthopedic Surgery

## 2022-09-29 ENCOUNTER — Ambulatory Visit (INDEPENDENT_AMBULATORY_CARE_PROVIDER_SITE_OTHER): Payer: Medicare Other | Admitting: Orthopedic Surgery

## 2022-09-29 DIAGNOSIS — M76822 Posterior tibial tendinitis, left leg: Secondary | ICD-10-CM
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
## 2022-10-01 ENCOUNTER — Encounter: Payer: Self-pay | Admitting: Family Medicine

## 2022-10-03 ENCOUNTER — Encounter: Payer: Self-pay | Admitting: Emergency Medicine

## 2022-10-03 ENCOUNTER — Ambulatory Visit (INDEPENDENT_AMBULATORY_CARE_PROVIDER_SITE_OTHER): Payer: Medicare Other | Admitting: Family Medicine

## 2022-10-03 ENCOUNTER — Encounter: Payer: Self-pay | Admitting: Family Medicine

## 2022-10-03 ENCOUNTER — Encounter (HOSPITAL_BASED_OUTPATIENT_CLINIC_OR_DEPARTMENT_OTHER): Payer: Self-pay | Admitting: Urology

## 2022-10-03 ENCOUNTER — Ambulatory Visit
Admission: EM | Admit: 2022-10-03 | Discharge: 2022-10-03 | Disposition: A | Discharge status: Home / Self Care | Payer: Medicare Other | Attending: Emergency Medicine | Admitting: Emergency Medicine

## 2022-10-03 ENCOUNTER — Other Ambulatory Visit: Payer: Self-pay

## 2022-10-03 ENCOUNTER — Emergency Department (HOSPITAL_BASED_OUTPATIENT_CLINIC_OR_DEPARTMENT_OTHER)
Admission: EM | Admit: 2022-10-03 | Discharge: 2022-10-04 | Discharge status: Against Medical Advice | Payer: Medicare Other | Attending: Emergency Medicine | Admitting: Emergency Medicine

## 2022-10-03 VITALS — BP 118/80 | HR 71 | Temp 98.4°F | Resp 16 | Ht 63.0 in | Wt 154.1 lb

## 2022-10-03 DIAGNOSIS — Z79899 Other long term (current) drug therapy: Secondary | ICD-10-CM | POA: Diagnosis not present

## 2022-10-03 DIAGNOSIS — L509 Urticaria, unspecified: Secondary | ICD-10-CM | POA: Diagnosis not present

## 2022-10-03 DIAGNOSIS — R8290 Unspecified abnormal findings in urine: Secondary | ICD-10-CM

## 2022-10-03 DIAGNOSIS — L239 Allergic contact dermatitis, unspecified cause: Secondary | ICD-10-CM | POA: Diagnosis not present

## 2022-10-03 DIAGNOSIS — I10 Essential (primary) hypertension: Secondary | ICD-10-CM | POA: Insufficient documentation

## 2022-10-03 DIAGNOSIS — E039 Hypothyroidism, unspecified: Secondary | ICD-10-CM | POA: Insufficient documentation

## 2022-10-03 DIAGNOSIS — Z5321 Procedure and treatment not carried out due to patient leaving prior to being seen by health care provider: Secondary | ICD-10-CM | POA: Insufficient documentation

## 2022-10-03 DIAGNOSIS — Z7982 Long term (current) use of aspirin: Secondary | ICD-10-CM | POA: Diagnosis not present

## 2022-10-03 DIAGNOSIS — R300 Dysuria: Secondary | ICD-10-CM | POA: Diagnosis not present

## 2022-10-03 DIAGNOSIS — Z96651 Presence of right artificial knee joint: Secondary | ICD-10-CM | POA: Insufficient documentation

## 2022-10-03 DIAGNOSIS — R21 Rash and other nonspecific skin eruption: Secondary | ICD-10-CM | POA: Insufficient documentation

## 2022-10-03 LAB — POCT URINALYSIS DIPSTICK
Blood, UA: NEGATIVE
Glucose, UA: NEGATIVE
Ketones, UA: POSITIVE
Leukocytes, UA: NEGATIVE
Protein, UA: NEGATIVE
Spec Grav, UA: 1.025 (ref 1.010–1.025)
Urobilinogen, UA: 0.2 E.U./dL
pH, UA: 7.5 (ref 5.0–8.0)

## 2022-10-03 MED ORDER — CEPHALEXIN 500 MG PO CAPS: 500.0000 mg | ORAL_CAPSULE | Freq: Two times a day (BID) | ORAL | 0 refills | Status: AC

## 2022-10-03 MED ORDER — METHYLPREDNISOLONE ACETATE 80 MG/ML IJ SUSP
120.0000 mg | Freq: Once | INTRAMUSCULAR | Status: AC
  Administered 2022-10-03: 120 mg via INTRAMUSCULAR

## 2022-10-03 NOTE — Patient Instructions (Signed)
Follow up as needed or as scheduled START the Cephalexin as directed- take w/ food Drink LOTS of fluids You're already doing all the right things Call with any questions or concerns Hang in there!!!
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
## 2022-10-03 NOTE — ED Triage Notes (Signed)
Pt reports a possible allergic reaction. States she had a rash on her hand but now has noticed large hives on both upper arms. Has tried Claritin, Benadryl and triamcinolone cream with no relief. Feels like it may be a reaction from a shirt she was wearing.

## 2022-10-03 NOTE — Progress Notes (Signed)
   Subjective:    Patient ID: Melissa Jordan, female    DOB: [birth year], 77 y.o.   MRN: [id number]  HPI Foul smelling urine- pt reports urine developed a foul smell when she was on medication.  After 2 weeks off medication she still had the smell- 'like cabbage'.  No burning w/ urination, denies frequency and urgency.  Denies suprapubic pain or CVA tenderness   Review of Systems For ROS see HPI     Objective:   Physical Exam Vitals reviewed.  Constitutional:      General: She is not in acute distress.    Appearance: Normal appearance. She is well-developed. She is not ill-appearing.  Abdominal:     General: There is no distension.     Palpations: Abdomen is soft.     Tenderness: There is no abdominal tenderness (no suprapubic or CVA tenderness).  Skin:    General: Skin is warm and dry.  Neurological:     General: No focal deficit present.     Mental Status: She is alert and oriented to person, place, and time.  Psychiatric:        Mood and Affect: Mood normal.        Behavior: Behavior normal.        Thought Content: Thought content normal.           Assessment & Plan:   Foul smelling urine- new.  Pt reports cloudy, foul smelling urine for quite awhile- even after stopping medication.  Will send urine for culture and start Keflex to tx possible infxn.  Pt expressed understanding and is in agreement w/ plan.

## 2022-10-03 NOTE — ED Triage Notes (Signed)
Pt has rash that started on hand and is now spreading to arms, chest, and face, states it is itching and burning Seen at pcp today and given steroid injection Has taken benadryl at 2000, using triamcinolone cream as well

## 2022-10-03 NOTE — Discharge Instructions (Signed)
You received an injection of methylprednisolone during your visit today.  Please continue to monitor your rash over the next hour.  If you do not have at least modest improvement of the rash or the rash continues to worsen despite this injection, please go to the emergency room immediately for further evaluation.

## 2022-10-04 ENCOUNTER — Emergency Department (HOSPITAL_COMMUNITY)
Admission: EM | Admit: 2022-10-04 | Discharge: 2022-10-04 | Disposition: A | Discharge status: Home / Self Care | Payer: Medicare Other | Source: Home / Self Care | Attending: Emergency Medicine | Admitting: Emergency Medicine

## 2022-10-04 DIAGNOSIS — Z79899 Other long term (current) drug therapy: Secondary | ICD-10-CM | POA: Insufficient documentation

## 2022-10-04 DIAGNOSIS — I10 Essential (primary) hypertension: Secondary | ICD-10-CM | POA: Insufficient documentation

## 2022-10-04 DIAGNOSIS — Z96651 Presence of right artificial knee joint: Secondary | ICD-10-CM | POA: Insufficient documentation

## 2022-10-04 DIAGNOSIS — L239 Allergic contact dermatitis, unspecified cause: Secondary | ICD-10-CM | POA: Insufficient documentation

## 2022-10-04 DIAGNOSIS — E039 Hypothyroidism, unspecified: Secondary | ICD-10-CM | POA: Insufficient documentation

## 2022-10-04 DIAGNOSIS — Z7982 Long term (current) use of aspirin: Secondary | ICD-10-CM | POA: Insufficient documentation

## 2022-10-04 MED ORDER — PREDNISONE 10 MG (21) PO TBPK: ORAL_TABLET | Freq: Every day | ORAL | 0 refills | Status: DC

## 2022-10-04 MED ORDER — HYDROXYZINE HCL 10 MG PO TABS
10.0000 mg | ORAL_TABLET | Freq: Once | ORAL | Status: AC
  Administered 2022-10-04: 10 mg via ORAL
  Filled 2022-10-04: qty 1

## 2022-10-04 MED ORDER — DIPHENHYDRAMINE-ZINC ACETATE 2-0.1 % EX CREA: 1.0000 | TOPICAL_CREAM | Freq: Three times a day (TID) | CUTANEOUS | 0 refills | Status: DC | PRN

## 2022-10-04 NOTE — Discharge Instructions (Addendum)
We evaluated you for your rash.  Your symptoms are likely due to an allergy exposure, although it is unclear what triggered your reaction.  Since your rash is primarily below your shirt sleeves, it is likely due to contact with an allergen, whether this is a plant allergen, soap, detergent, lotion, or other trigger.  Please continue to take your Benadryl as needed for itching.  I have also prescribed you Benadryl cream and a steroid taper.  I hope these will improve your symptoms.  Please call to schedule an appointment with a dermatologist and allergy specialist.  If your symptoms worsen, you develop any blisters, rash on the inside of your mouth, eye pain, wheezing or shortness of breath, facial swelling, nausea or vomiting, abdominal pain, lightheadedness or fainting, fevers, or any other concerning symptoms, please return to the emergency department.

## 2022-10-04 NOTE — ED Triage Notes (Addendum)
Pt started having rash on hand today. PCP told her to double up on her antihistamine. Left arm is very red. Lips and mouthing tingling about an hour ago. Voice is hoarse and she feels like something in her throat. Sats WNL.   SHe went to UC and got injection of steriod around 5pm Pt has taken Claritin x 2 and 1 benadryl today at midnight and at 0600.
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
## 2022-10-05 ENCOUNTER — Ambulatory Visit
Admission: EM | Admit: 2022-10-05 | Discharge: 2022-10-05 | Disposition: A | Discharge status: Home / Self Care | Payer: Medicare Other | Attending: Physician Assistant | Admitting: Physician Assistant

## 2022-10-05 DIAGNOSIS — L239 Allergic contact dermatitis, unspecified cause: Secondary | ICD-10-CM

## 2022-10-05 MED ORDER — HYDROXYZINE HCL 25 MG PO TABS: 25.0000 mg | ORAL_TABLET | Freq: Three times a day (TID) | ORAL | 0 refills | Status: DC | PRN
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
## 2022-10-05 NOTE — Discharge Instructions (Addendum)
See your Physician for recheck this week.   °

## 2022-10-05 NOTE — ED Triage Notes (Signed)
Pt presents with ongoing allergic reaction to unknown source to both arms; pt has visible itchiness and hives that is unrelieved with prescribed medication.

## 2022-10-05 NOTE — ED Triage Notes (Signed)
Pt states she has tried benadryl, steriod shot, and steriod cream and it has unrelieved rash; still with itchiness and spreading in different areas.

## 2022-10-06 ENCOUNTER — Ambulatory Visit: Payer: Medicare Other | Admitting: Family Medicine

## 2022-10-06 ENCOUNTER — Telehealth: Payer: Self-pay

## 2022-10-06 ENCOUNTER — Encounter: Payer: Self-pay | Admitting: Family Medicine

## 2022-10-06 DIAGNOSIS — L255 Unspecified contact dermatitis due to plants, except food: Secondary | ICD-10-CM

## 2022-10-06 DIAGNOSIS — L258 Unspecified contact dermatitis due to other agents: Secondary | ICD-10-CM

## 2022-10-06 LAB — URINE CULTURE
MICRO NUMBER:: 14818872
SPECIMEN QUALITY:: ADEQUATE

## 2022-10-06 NOTE — Telephone Encounter (Signed)
Left lab results on pt VM 

## 2022-10-06 NOTE — Telephone Encounter (Signed)
-----   Message from Sheliah Hatch, MD sent at 10/06/2022  7:21 AM EDT ----- Your UTI is being treated appropriately- great news!

## 2022-10-07 ENCOUNTER — Telehealth: Payer: Self-pay

## 2022-10-07 NOTE — Telephone Encounter (Signed)
     Patient  visit on 4/13  at [facility]  Have you been able to follow up with your primary care physician? Yes   The patient was or was not able to obtain any needed medicine or equipment. Yes   Are there diet recommendations that you are having difficulty following? na  Patient expresses understanding of discharge instructions and education provided has no other needs at this time. Yes     Lenard Forth [facility] Guide, [facility] [phone] [address] [place], [place], [place] [address] Phone: [phone] Email: [email or web address]

## 2022-10-08 ENCOUNTER — Other Ambulatory Visit: Payer: Self-pay | Admitting: Family Medicine

## 2022-10-08 DIAGNOSIS — M8580 Other specified disorders of bone density and structure, unspecified site: Secondary | ICD-10-CM

## 2022-10-13 ENCOUNTER — Ambulatory Visit: Payer: Medicare Other | Admitting: Dermatology

## 2022-10-13 ENCOUNTER — Encounter: Payer: Self-pay | Admitting: Dermatology

## 2022-10-13 VITALS — BP 128/81

## 2022-10-13 DIAGNOSIS — Z9689 Presence of other specified functional implants: Secondary | ICD-10-CM | POA: Diagnosis not present

## 2022-10-13 DIAGNOSIS — G894 Chronic pain syndrome: Secondary | ICD-10-CM | POA: Diagnosis not present

## 2022-10-13 DIAGNOSIS — L509 Urticaria, unspecified: Secondary | ICD-10-CM | POA: Diagnosis not present

## 2022-10-13 DIAGNOSIS — D692 Other nonthrombocytopenic purpura: Secondary | ICD-10-CM

## 2022-10-13 MED ORDER — CLOBETASOL PROPIONATE 0.05 % EX CREA: 1.0000 | TOPICAL_CREAM | Freq: Two times a day (BID) | CUTANEOUS | 0 refills | Status: DC

## 2022-10-13 NOTE — Progress Notes (Unsigned)
   New Patient Visit  % cream Subjective  Melissa Jordan is a 77 y.o. female who presents for the following: Rash all over that itches a lot. She had a breakout right before the first of the year that was thought to be related to generic plavix. It went away after she stopped the medication but has now come back and spread all over. She has been on a couple rounds of prednisone. The breakouts come up as blisters.She went to the ER for condition. Her PCP gave her [facility] 0.1% cream.    The following portions of the chart were reviewed this encounter and updated as appropriate: medications, allergies, medical history  Review of Systems:  No other skin or systemic complaints except as noted in HPI or Assessment and Plan.  Objective  Well appearing patient in no apparent distress; mood and affect are within normal limits.   A focused examination was performed of the following areas: Face, arms, feet   Relevant exam findings are noted in the Assessment and Plan.    Assessment & Plan   Urticaria Exam: Lesions are resolving, no new active lesions  Treatment Plan: -Unknown trigger at this time -Clobetasol cream bid to affected areas of itch x 2 weeks -Start Cerave Anti-itch cream daily as needed -Finish course of prednisone -Continue antihistamines and keep appointment with allergist.     Purpura - Chronic; persistent and recurrent.  Treatable, but not curable. - Violaceous macules and patches - Benign -Recommended pt stop rubbing  - Observe - Can use OTC arnica containing moisturizer such as Dermend Bruise Formula if desired - Call for worsening or other concerns   Return in about 1 month (around 11/12/2022) for Follow up.  I, Joanie Coddington, CMA, am acting as scribe for Langston Reusing, MD .   Documentation: I have reviewed the above documentation for accuracy and completeness, and I agree with the above.  Langston Reusing, MD

## 2022-10-13 NOTE — Patient Instructions (Signed)
Due to recent changes in healthcare laws, you may see results of your pathology and/or laboratory studies on MyChart before the doctors have had a chance to review them. We understand that in some cases there may be results that are confusing or concerning to you. Please understand that not all results are received at the same time and often the doctors may need to interpret multiple results in order to provide you with the best plan of care or course of treatment. Therefore, we ask that you please give us 2 business days to thoroughly review all your results before contacting the office for clarification. Should we see a critical lab result, you will be contacted sooner.   If You Need Anything After Your Visit  If you have any questions or concerns for your doctor, please call our main line at [phone] If no one answers, please leave a voicemail as directed and we will return your call as soon as possible. Messages left after 4 pm will be answered the following business day.   You may also send us a message via MyChart. We typically respond to MyChart messages within 1-2 business days.  For prescription refills, please ask your pharmacy to contact our office. Our fax number is [phone].  If you have an urgent issue when the clinic is closed that cannot wait until the next business day, you can page your doctor at the number below.    Please note that while we do our best to be available for urgent issues outside of office hours, we are not available 24/7.   If you have an urgent issue and are unable to reach us, you may choose to seek medical care at your doctor's office, retail clinic, urgent care center, or emergency room.  If you have a medical emergency, please immediately call 911 or go to the emergency department. In the event of inclement weather, please call our main line at [phone] for an update on the status of any delays or closures.  Dermatology Medication Tips: Please  keep the boxes that topical medications come in in order to help keep track of the instructions about where and how to use these. Pharmacies typically print the medication instructions only on the boxes and not directly on the medication tubes.   If your medication is too expensive, please contact our office at [phone] or send us a message through MyChart.   We are unable to tell what your co-pay for medications will be in advance as this is different depending on your insurance coverage. However, we may be able to find a substitute medication at lower cost or fill out paperwork to get insurance to cover a needed medication.   If a prior authorization is required to get your medication covered by your insurance company, please allow us 1-2 business days to complete this process.  Drug prices often vary depending on where the prescription is filled and some pharmacies may offer cheaper prices.  The website www.goodrx.com contains coupons for medications through different pharmacies. The prices here do not account for what the cost may be with help from insurance (it may be cheaper with your insurance), but the website can give you the price if you did not use any insurance.  - You can print the associated coupon and take it with your prescription to the pharmacy.  - You may also stop by our office during regular business hours and pick up a GoodRx coupon card.  - If you need your   prescription sent electronically to a different pharmacy, notify our office through [facility] MyChart or by phone at [phone]     

## 2022-10-16 ENCOUNTER — Encounter: Payer: Self-pay | Admitting: Family Medicine

## 2022-10-16 ENCOUNTER — Telehealth: Payer: Self-pay

## 2022-10-16 MED ORDER — PREDNISONE 10 MG PO TABS
ORAL_TABLET | ORAL | 0 refills | Status: DC
Start: 2022-10-16 — End: 2023-01-05

## 2022-10-16 NOTE — Telephone Encounter (Signed)
Pt sent a my chart message asking  I have finished prednisone. Allergist appt on May 1. Still itching and hives everywhere. Only have one pill left   Prescription from urgent care.  Can I get a new one to cover until I see allergist  Can we send her in another Rx ?

## 2022-10-16 NOTE — Telephone Encounter (Signed)
Requesting refill on prednisone due to continued hives and Allergist appt next week   Please advise

## 2022-10-17 ENCOUNTER — Telehealth: Payer: Self-pay

## 2022-10-17 NOTE — Telephone Encounter (Signed)
Pt is asking if we refill the Rx Hydroxyzine ? It appears that another provider gave that to her and she is asking if we can .

## 2022-10-18 ENCOUNTER — Emergency Department (HOSPITAL_COMMUNITY)
Admission: EM | Admit: 2022-10-18 | Discharge: 2022-10-19 | Disposition: A | Discharge status: Home / Self Care | Payer: Medicare Other | Attending: Emergency Medicine | Admitting: Emergency Medicine

## 2022-10-18 ENCOUNTER — Encounter (HOSPITAL_COMMUNITY): Payer: Self-pay

## 2022-10-18 ENCOUNTER — Other Ambulatory Visit: Payer: Self-pay

## 2022-10-18 ENCOUNTER — Emergency Department (HOSPITAL_COMMUNITY): Payer: Medicare Other

## 2022-10-18 DIAGNOSIS — Z7982 Long term (current) use of aspirin: Secondary | ICD-10-CM | POA: Insufficient documentation

## 2022-10-18 DIAGNOSIS — Z743 Need for continuous supervision: Secondary | ICD-10-CM | POA: Diagnosis not present

## 2022-10-18 DIAGNOSIS — I499 Cardiac arrhythmia, unspecified: Secondary | ICD-10-CM | POA: Diagnosis not present

## 2022-10-18 DIAGNOSIS — R079 Chest pain, unspecified: Secondary | ICD-10-CM | POA: Insufficient documentation

## 2022-10-18 DIAGNOSIS — R0789 Other chest pain: Secondary | ICD-10-CM | POA: Diagnosis not present

## 2022-10-18 DIAGNOSIS — R531 Weakness: Secondary | ICD-10-CM | POA: Insufficient documentation

## 2022-10-18 LAB — CBC WITH DIFFERENTIAL/PLATELET
Abs Immature Granulocytes: 0.25 10*3/uL — ABNORMAL HIGH (ref 0.00–0.07)
Basophils Absolute: 0.1 10*3/uL (ref 0.0–0.1)
Basophils Relative: 0 %
Eosinophils Absolute: 0.1 10*3/uL (ref 0.0–0.5)
Eosinophils Relative: 0 %
HCT: 41.7 % (ref 36.0–46.0)
Hemoglobin: 13.5 g/dL (ref 12.0–15.0)
Immature Granulocytes: 1 %
Lymphocytes Relative: 9 %
Lymphs Abs: 2 10*3/uL (ref 0.7–4.0)
MCH: 29.6 pg (ref 26.0–34.0)
MCHC: 32.4 g/dL (ref 30.0–36.0)
MCV: 91.4 fL (ref 80.0–100.0)
Monocytes Absolute: 1.4 10*3/uL — ABNORMAL HIGH (ref 0.1–1.0)
Monocytes Relative: 6 %
Neutro Abs: 19.1 10*3/uL — ABNORMAL HIGH (ref 1.7–7.7)
Neutrophils Relative %: 84 %
Platelets: 289 10*3/uL (ref 150–400)
RBC: 4.56 MIL/uL (ref 3.87–5.11)
RDW: 14.6 % (ref 11.5–15.5)
WBC: 22.8 10*3/uL — ABNORMAL HIGH (ref 4.0–10.5)
nRBC: 0 % (ref 0.0–0.2)

## 2022-10-18 LAB — COMPREHENSIVE METABOLIC PANEL
ALT: 14 U/L (ref 0–44)
AST: 16 U/L (ref 15–41)
Albumin: 3.7 g/dL (ref 3.5–5.0)
Alkaline Phosphatase: 77 U/L (ref 38–126)
Anion gap: 11 (ref 5–15)
BUN: 26 mg/dL — ABNORMAL HIGH (ref 8–23)
CO2: 23 mmol/L (ref 22–32)
Calcium: 9 mg/dL (ref 8.9–10.3)
Chloride: 103 mmol/L (ref 98–111)
Creatinine, Ser: 1.02 mg/dL — ABNORMAL HIGH (ref 0.44–1.00)
GFR, Estimated: 57 mL/min — ABNORMAL LOW (ref >60)
Glucose, Bld: 84 mg/dL (ref 70–99)
Potassium: 4.3 mmol/L (ref 3.5–5.1)
Sodium: 137 mmol/L (ref 135–145)
Total Bilirubin: 0.7 mg/dL (ref 0.3–1.2)
Total Protein: 5.9 g/dL — ABNORMAL LOW (ref 6.5–8.1)

## 2022-10-18 LAB — TROPONIN I (HIGH SENSITIVITY): Troponin I (High Sensitivity): 11 ng/L (ref <18)

## 2022-10-18 NOTE — ED Provider Notes (Signed)
EMERGENCY DEPARTMENT AT [facility] Provider Note   CSN: [id number] Arrival date & time: 10/18/22  1747     History  Chief Complaint  Patient presents with   Chest Pain    Melissa Jordan is a 77 y.o. female.  Patient here after some episode of generalized weakness and chest pain while she was doing some gardening.  Her leg started to get weak and she developed some chest pain that went away after a little bit.  She has history of paroxysmal A-fib now status post a watchman.  She not having any discomfort now.  No chest pain or shortness of breath or weakness or numbness or tingling.  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  She denies any abdominal pain.  She did not fall or hit her head.  She did not pass out.  She has not had any episodes of chest pain otherwise.  She did have an episode of lightheadedness a few days ago that self resolved as well.  The history is provided by the patient.       Home Medications Prior to Admission medications   Medication Sig Start Date End Date Taking? Authorizing Provider  aspirin EC 81 MG tablet Take 1 tablet (81 mg total) by mouth daily. Swallow whole. 09/08/22   Lanier Prude, MD  augmented betamethasone dipropionate (DIPROLENE) 0.05 % ointment Apply topically 2 (two) times daily. 08/07/22   Sheliah Hatch, MD  azelastine (ASTELIN) 0.1 % nasal spray Place 1 spray into both nostrils 2 (two) times daily. Use in each nostril as directed Patient taking differently: Place 1 spray into both nostrils 2 (two) times daily as needed (nasal congestion.). Use in each nostril as directed 05/14/21   Janeece Agee, NP  Calcium Carb-Cholecalciferol (CALCIUM 600 + D PO) Take 1 tablet by mouth at bedtime.    [provider]  Cholecalciferol (VITAMIN D) 50 MCG (2000 UT) tablet Take 3,000 Units by mouth daily.    [provider]  clobetasol cream (TEMOVATE) 0.05 % Apply 1 Application topically 2  (two) times daily. To itchy areas x 2 weeks. Avoid face, groin, underarms 10/13/22   Terri Piedra, MD  diphenhydrAMINE-zinc acetate (BENADRYL EXTRA STRENGTH) cream Apply 1 Application topically 3 (three) times daily as needed for itching. 10/04/22   Lonell Grandchild, MD  estradiol (ESTRACE) 1 MG tablet TAKE 1 TABLET BY MOUTH DAILY 10/09/22   Sheliah Hatch, MD  famotidine (PEPCID) 20 MG tablet Take 1 tablet (20 mg total) by mouth 2 (two) times daily. 08/07/22   Sheliah Hatch, MD  hydrOXYzine (ATARAX) 25 MG tablet Take 1 tablet (25 mg total) by mouth 3 (three) times daily as needed. 10/05/22   Elson Areas, PA-C  levothyroxine (SYNTHROID) 25 MCG tablet Take 1 tablet (25 mcg total) by mouth daily. Patient taking differently: Take 25 mcg by mouth See admin instructions. Take 1 tablet (25 mcg) by mouth on Mondays- Fridays. Take 2 tablets (50 mcg) by mouth on Saturdays & Sundays. 04/10/21   Sheliah Hatch, MD  loratadine (CLARITIN) 10 MG tablet Take 10 mg by mouth in the morning.    [provider]  magnesium oxide (MAG-OX) 400 MG tablet Take 400 mg by mouth at bedtime.    [provider]  methocarbamol (ROBAXIN) 500 MG tablet Take 1 tablet (500 mg total) by mouth every 6 (six) hours as needed for muscle spasms. Patient not taking: Reported on  10/03/2022 08/01/22   Adonis Huguenin, NP  metoprolol succinate (TOPROL XL) 25 MG 24 hr tablet Take 1 tablet (25 mg total) by mouth daily. Patient taking differently: Take 25 mg by mouth at bedtime. 07/07/22   Lanier Prude, MD  mometasone (NASONEX) 50 MCG/ACT nasal spray Place 2 sprays into the nose daily as needed (congestion).    [provider]  oxyCODONE-acetaminophen (PERCOCET/ROXICET) 5-325 MG tablet Take 1 tablet by mouth every 4 (four) hours as needed. Patient not taking: Reported on 10/03/2022 08/01/22   Adonis Huguenin, NP  Polyethyl Glycol-Propyl Glycol (SYSTANE) 0.4-0.3 % SOLN Place 1 drop into both eyes  daily.    [provider]  predniSONE (DELTASONE) 10 MG tablet 3 tabs x3 days and then 2 tabs x3 days and then 1 tab x3 days.  Take w/ food. 10/16/22   Sheliah Hatch, MD  rOPINIRole (REQUIP) 3 MG tablet TAKE 1 TABLET BY MOUTH AT  BEDTIME 08/25/22   Sheliah Hatch, MD  triamcinolone ointment (KENALOG) 0.1 % Apply 1 Application topically 2 (two) times daily. 12/16/21 12/16/22  Sheliah Hatch, MD      Allergies    Lyrica [pregabalin], Meclizine, Penicillins, Poison sumac extract, Propoxyphene, Bactrim [sulfamethoxazole-trimethoprim], Codeine, Morphine sulfate, Propoxyphene n-acetaminophen, and Vancomycin    Review of Systems   Review of Systems  Physical Exam Updated Vital Signs BP 119/62   Pulse 77   Temp (!) 97.5 F (36.4 C) (Oral)   Resp 14   Ht 5\' 3"  (1.6 m)   Wt 65.3 kg   SpO2 100%   BMI 25.51 kg/m  Physical Exam Vitals and nursing note reviewed.  Constitutional:      General: She is not in acute distress.    Appearance: She is well-developed. She is not ill-appearing.  HENT:     Head: Normocephalic and atraumatic.  Eyes:     Extraocular Movements: Extraocular movements intact.     Conjunctiva/sclera: Conjunctivae normal.     Pupils: Pupils are equal, round, and reactive to light.  Cardiovascular:     Rate and Rhythm: Normal rate and regular rhythm.     Pulses:          Radial pulses are 2+ on the right side and 2+ on the left side.     Heart sounds: Normal heart sounds. No murmur heard. Pulmonary:     Effort: Pulmonary effort is normal. No respiratory distress.     Breath sounds: Normal breath sounds. No decreased breath sounds, wheezing or rhonchi.  Abdominal:     Palpations: Abdomen is soft.     Tenderness: There is no abdominal tenderness.  Musculoskeletal:        General: No swelling.     Cervical back: Normal range of motion and neck supple.  Skin:    General: Skin is warm and dry.     Capillary Refill: Capillary refill takes less than 2  seconds.  Neurological:     General: No focal deficit present.     Mental Status: She is alert.  Psychiatric:        Mood and Affect: Mood normal.     ED Results / Procedures / Treatments   Labs (all labs ordered are listed, but only abnormal results are displayed) Labs Reviewed  CBC WITH DIFFERENTIAL/PLATELET - Abnormal; Notable for the following components:      Result Value   WBC 22.8 (*)    Neutro Abs 19.1 (*)    Monocytes Absolute 1.4 (*)  Abs Immature Granulocytes 0.25 (*)    All other components within normal limits  COMPREHENSIVE METABOLIC PANEL - Abnormal; Notable for the following components:   BUN 26 (*)    Creatinine, Ser 1.02 (*)    Total Protein 5.9 (*)    GFR, Estimated 57 (*)    All other components within normal limits  TROPONIN I (HIGH SENSITIVITY)    EKG EKG Interpretation  Date/Time:  Saturday October 18 2022 17:57:43 EDT Ventricular Rate:  69 PR Interval:  148 QRS Duration: 83 QT Interval:  392 QTC Calculation: 420 R Axis:   49 Text Interpretation: Sinus rhythm Probable left atrial enlargement Confirmed by Virgina Norfolk ([id number]) on 10/18/2022 6:21:33 PM  Radiology DG Chest Portable 1 View  Result Date: 10/18/2022 CLINICAL DATA:  Chest pain. EXAM: PORTABLE CHEST 1 VIEW COMPARISON:  Nov 01, 2020 FINDINGS: Left atrial occlusion devise noted.  Spinal stimulator in place. Cardiomediastinal silhouette is normal. Mediastinal contours appear intact. There is no evidence of focal airspace consolidation, pleural effusion or pneumothorax. Osseous structures are without acute abnormality. Soft tissues are grossly normal. IMPRESSION: No active disease. Electronically Signed   By: Ted Mcalpine M.D.   On: 10/18/2022 18:33    Procedures Procedures    Medications Ordered in ED Medications - No data to display  ED Course/ Medical Decision Making/ A&P                             Medical Decision Making Amount and/or Complexity of Data Reviewed Labs:  ordered. Radiology: ordered.   Melissa Jordan is here after an episode of chest pain and generalized weakness.  Patient with history of A-fib status post watchman not on anticoagulation, she has been chronically on steroids for dermatitis.  She was working in the garden when she got a little bit weak in her legs and had some chest pain that is now resolved.  She has not had any chest pain episodes recently.  She had may be some episode of lightheadedness recently as well but she did not pass out with this event today.  She denies any cough or sputum production or abdominal pain or nausea vomiting.  She is asymptomatic now.  EKG shows sinus rhythm.  No ischemic changes.  Differential diagnosis possibly vasovagal/heat related event versus less likely ACS, infectious process, dehydration.  Neurologic exam is normal and no concern for stroke or other neurologic process.  She had a CT coronary done couple months ago that per my review interpretation showed great calcium score.  Did not really show any signs of atherosclerotic disease.  Watchman appears to be in place well.  Overall will get CBC, CMP and troponin and chest x-ray.  Overall blood work for my review and interpretation is unremarkable.  Troponin normal.  EKG shows sinus rhythm with no ischemic changes.  I talked with cardiology, Dr. Brayton Layman.  Given atypical story and normal coronary CT few months ago is unlikely that this is ACS.  Had more questions about the watchman and overall she is not in A-fib now.  It is possible maybe she is having some episodes of paroxysmal A-fib that are may be causing her some symptoms but at this time nothing further to do as far as A-fib.  Discharged in good condition.  Understands return precautions.  This chart was dictated using voice recognition software.  Despite best efforts to proofread,  errors can occur which can change the documentation meaning.  Final Clinical Impression(s) / ED  Diagnoses Final diagnoses:  Nonspecific chest pain    Rx / DC Orders ED Discharge Orders     None         Virgina Norfolk, DO 10/18/22 2043

## 2022-10-18 NOTE — ED Triage Notes (Signed)
Pt BIBGEMS from home after gardening and went to go inside to make dinner when she developed sudden onset generalized weakness and chest pain sharp radiating to the jaw and to the back.   Hx - irregular heartbeat, PVCs with oxygen disappeared then PACs appeared  144/80 100 o2 324 asa  Declined nitro

## 2022-10-20 ENCOUNTER — Encounter: Payer: Self-pay | Admitting: Family Medicine

## 2022-10-20 ENCOUNTER — Other Ambulatory Visit: Payer: Self-pay

## 2022-10-20 DIAGNOSIS — L258 Unspecified contact dermatitis due to other agents: Secondary | ICD-10-CM

## 2022-10-20 MED ORDER — HYDROXYZINE HCL 25 MG PO TABS
25.0000 mg | ORAL_TABLET | Freq: Three times a day (TID) | ORAL | 1 refills | Status: DC | PRN
Start: 2022-10-20 — End: 2022-12-05

## 2022-10-20 NOTE — Telephone Encounter (Signed)
Ok to refill x 1  

## 2022-10-20 NOTE — Telephone Encounter (Signed)
Refill sent in
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
## 2022-10-22 ENCOUNTER — Other Ambulatory Visit: Payer: Self-pay

## 2022-10-22 ENCOUNTER — Ambulatory Visit: Payer: Medicare Other | Admitting: Internal Medicine

## 2022-10-22 ENCOUNTER — Encounter: Payer: Self-pay | Admitting: Internal Medicine

## 2022-10-22 VITALS — BP 110/68 | HR 60 | Temp 98.0°F | Resp 16 | Ht 63.0 in | Wt 151.8 lb

## 2022-10-22 DIAGNOSIS — L508 Other urticaria: Secondary | ICD-10-CM | POA: Diagnosis not present

## 2022-10-22 DIAGNOSIS — T781XXA Other adverse food reactions, not elsewhere classified, initial encounter: Secondary | ICD-10-CM | POA: Diagnosis not present

## 2022-10-22 DIAGNOSIS — L501 Idiopathic urticaria: Secondary | ICD-10-CM | POA: Diagnosis not present

## 2022-10-22 MED ORDER — MONTELUKAST SODIUM 10 MG PO TABS
10.0000 mg | ORAL_TABLET | Freq: Every day | ORAL | 5 refills | Status: DC
Start: 2022-10-22 — End: 2022-12-17

## 2022-10-22 NOTE — Patient Instructions (Addendum)
Chronic Idiopathic Urticaria: - this is defined as hives lasting more than 6 weeks without an identifiable trigger - hives can be from a number of different sources including infections, allergies, vibration, temperature, pressure among many others other possible causes - often an identifiable cause is not determined - some potential triggers include: stress, illness, NSAIDs, aspirin, hormonal changes - you do not have any red flag symptoms to make [place] concerned about secondary causes of hives, but we will screen for these for reassurance with:  tryptase, thryoid labs, alpha gal-mammalian meat delayed food allergy, and CU index-often elevated in autoimmune hives". - approximately 50% of patients with chronic hives can have some associated swelling of the face/lips/eyelids (this is not a cause for alarm and does not typically progress onto systemic allergic reactions)  Therapy Plan:  - start  zyrtec (cetirizine) - can use up to  max dose of 20mg  (2 pills) twice daily- this is maximum dose - can increase or decrease dosing depending on symptom control to a maximum dose of 4 tablets of antihistamine daily. Wait until hives free for at least one month prior to decreasing dose.   - add singulair 10 mg nigthly-stop if nightmares of behavior changes - we will submit for Xolair (omalizumab)- an injectable medication for hives  Can use one of the following in place of zyrtec if desires: Claritin (loratadine) 10 mg, Xyzal (levocetirizine) 5 mg or Allegra (fexofenadine) 180 mg daily as needed  Follow up : 6 weeks, sooner if needed It was a pleasure meeting you in clinic today! Thank you for allowing me to participate in your care.  Tonny Bollman, MD [facility]

## 2022-10-23 ENCOUNTER — Telehealth: Payer: Self-pay | Admitting: *Deleted

## 2022-10-23 NOTE — Telephone Encounter (Signed)
-----   Message from Verlee Monte, MD sent at 10/22/2022  1:11 PM EDT ----- Can we submit for Xolair for hives? She has failed high dose antihistamines.

## 2022-10-23 NOTE — Telephone Encounter (Signed)
Called patient and discussed Xolair through patient assistance program will mail app to patient to return to me

## 2022-10-24 LAB — ALPHA-GAL PANEL

## 2022-10-27 LAB — ALPHA-GAL PANEL: Beef IgE: 0.33 kU/L — AB

## 2022-10-28 ENCOUNTER — Telehealth: Payer: Self-pay

## 2022-10-28 NOTE — Telephone Encounter (Signed)
Transition Care Management Follow-up Telephone Call Date of discharge and from where: [facility] 4/28 How have you been since you were released from the hospital? ok Any questions or concerns? No  Items Reviewed: Did the pt receive and understand the discharge instructions provided? Yes  Medications obtained and verified? Yes  Other? No  Any new allergies since your discharge? No  Dietary orders reviewed? No Do you have support at home? Yes     Follow up appointments reviewed:  PCP Hospital f/u appt confirmed? Yes  Scheduled to see  on  @ . Specialist Hospital f/u appt confirmed? Yes  Scheduled to see  on  @ . Are transportation arrangements needed? No  If their condition worsens, is the pt aware to call PCP or go to the Emergency Dept.? Yes Was the patient provided with contact information for the PCP's office or ED? Yes Was to pt encouraged to call back with questions or concerns? Yes

## 2022-10-28 NOTE — Telephone Encounter (Signed)
Transition Care Management Unsuccessful Follow-up Telephone Call  Date of discharge and from where:  [facility] 4/28  Attempts:  1st Attempt  Reason for unsuccessful TCM follow-up call:  Left voice message   Aleesia Henney [facility] Guide, [place] [phone] [address], [place], [place] [address] Phone: [phone] Email: [email or web address]        

## 2022-10-30 LAB — THYROID PANEL WITH TSH
Free Thyroxine Index: 2.5 (ref 1.2–4.9)
T3 Uptake Ratio: 28 % (ref 24–39)
T4, Total: 9 ug/dL (ref 4.5–12.0)
TSH: 0.982 u[IU]/mL (ref 0.450–4.500)

## 2022-10-30 LAB — TRYPTASE: Tryptase: 3.2 ug/L (ref 2.2–13.2)

## 2022-10-30 LAB — ALPHA-GAL PANEL
Allergen Lamb IgE: 0.11 kU/L — AB
O215-IgE Alpha-Gal: 1.1 kU/L — AB

## 2022-10-30 LAB — CHRONIC URTICARIA: cu index: 5.6

## 2022-10-30 NOTE — Progress Notes (Signed)
Please let Melissa Jordan know that all labs have returned.  Thyroid levels were normal, the autoimmune form of hives returned and is negative. Your tryptase level also looked normal which is reassuring. All in all, everything looks okay except for the alpha gal.  Continue plan and I look forward to seeing you in follow-up.

## 2022-11-05 DIAGNOSIS — M5416 Radiculopathy, lumbar region: Secondary | ICD-10-CM | POA: Diagnosis not present

## 2022-11-12 ENCOUNTER — Encounter: Payer: Self-pay | Admitting: Dermatology

## 2022-11-12 ENCOUNTER — Ambulatory Visit: Payer: Medicare Other | Admitting: Dermatology

## 2022-11-12 VITALS — BP 119/78 | HR 61

## 2022-11-12 DIAGNOSIS — L509 Urticaria, unspecified: Secondary | ICD-10-CM | POA: Diagnosis not present

## 2022-11-12 MED ORDER — CLOBETASOL PROPIONATE 0.05 % EX CREA
1.0000 | TOPICAL_CREAM | Freq: Two times a day (BID) | CUTANEOUS | 2 refills | Status: DC
Start: 2022-11-12 — End: 2023-06-10

## 2022-11-12 NOTE — Progress Notes (Signed)
   Follow-Up Visit   Subjective  Melissa Jordan is a 77 y.o. female who presents for the following: Urticaria  Melissa Jordan is present in office today to follow up for Urticaria. She was previous evaluated by me on 10/13/22. She reports today her sxs are still present but not resolved. She states she used Clobetasol cream bid to affected areas of itch x 2 weeks, she continues Cerave Anti-itch cream daily as needed,she has finished the course of prednisone as of today & she continues to take antihistamines (Loratadine in the mornings and Hydroxyzine at night).   She was seen by Dr. 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 Next steps is to try a course of Xolair injections. Patient still having flares, however when clobetasol applied areas clear in 1-2 days.  The following portions of the chart were reviewed this encounter and updated as appropriate: medications, allergies, medical history  Review of Systems:  No other skin or systemic complaints except as noted in HPI or Assessment and Plan.  Objective  Well appearing patient in no apparent distress; mood and affect are within normal limits.  A focused examination was performed of the following areas: Posterior Neck, Right hand   Relevant exam findings are noted in the Assessment and Plan.    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    Documentation: I  have reviewed the above documentation for accuracy and completeness, and I agree with the above.  Stasia Cavalier, am acting as scribe for Langston Reusing, DO.   Langston Reusing, DO

## 2022-11-12 NOTE — Patient Instructions (Signed)
Due to recent changes in healthcare laws, you may see results of your pathology and/or laboratory studies on MyChart before the doctors have had a chance to review them. We understand that in some cases there may be results that are confusing or concerning to you. Please understand that not all results are received at the same time and often the doctors may need to interpret multiple results in order to provide you with the best plan of care or course of treatment. Therefore, we ask that you please give us 2 business days to thoroughly review all your results before contacting the office for clarification. Should we see a critical lab result, you will be contacted sooner.   If You Need Anything After Your Visit  If you have any questions or concerns for your doctor, please call our main line at [phone] If no one answers, please leave a voicemail as directed and we will return your call as soon as possible. Messages left after 4 pm will be answered the following business day.   You may also send us a message via MyChart. We typically respond to MyChart messages within 1-2 business days.  For prescription refills, please ask your pharmacy to contact our office. Our fax number is [phone].  If you have an urgent issue when the clinic is closed that cannot wait until the next business day, you can page your doctor at the number below.    Please note that while we do our best to be available for urgent issues outside of office hours, we are not available 24/7.   If you have an urgent issue and are unable to reach us, you may choose to seek medical care at your doctor's office, retail clinic, urgent care center, or emergency room.  If you have a medical emergency, please immediately call 911 or go to the emergency department. In the event of inclement weather, please call our main line at [phone] for an update on the status of any delays or closures.  Dermatology Medication Tips: Please  keep the boxes that topical medications come in in order to help keep track of the instructions about where and how to use these. Pharmacies typically print the medication instructions only on the boxes and not directly on the medication tubes.   If your medication is too expensive, please contact our office at [phone] or send us a message through MyChart.   We are unable to tell what your co-pay for medications will be in advance as this is different depending on your insurance coverage. However, we may be able to find a substitute medication at lower cost or fill out paperwork to get insurance to cover a needed medication.   If a prior authorization is required to get your medication covered by your insurance company, please allow us 1-2 business days to complete this process.  Drug prices often vary depending on where the prescription is filled and some pharmacies may offer cheaper prices.  The website www.goodrx.com contains coupons for medications through different pharmacies. The prices here do not account for what the cost may be with help from insurance (it may be cheaper with your insurance), but the website can give you the price if you did not use any insurance.  - You can print the associated coupon and take it with your prescription to the pharmacy.  - You may also stop by our office during regular business hours and pick up a GoodRx coupon card.  - If you need your   prescription sent electronically to a different pharmacy, notify our office through [facility] MyChart or by phone at [phone]     

## 2022-11-18 ENCOUNTER — Other Ambulatory Visit (HOSPITAL_COMMUNITY): Payer: Self-pay | Admitting: Neurological Surgery

## 2022-11-18 DIAGNOSIS — M5416 Radiculopathy, lumbar region: Secondary | ICD-10-CM

## 2022-11-27 ENCOUNTER — Ambulatory Visit (INDEPENDENT_AMBULATORY_CARE_PROVIDER_SITE_OTHER): Payer: Medicare Other

## 2022-11-27 DIAGNOSIS — L501 Idiopathic urticaria: Secondary | ICD-10-CM | POA: Diagnosis not present

## 2022-11-27 MED ORDER — EPINEPHRINE 0.3 MG/0.3ML IJ SOAJ: 0.3000 mg | INTRAMUSCULAR | 1 refills | Status: DC | PRN

## 2022-11-27 MED ORDER — OMALIZUMAB 150 MG/ML ~~LOC~~ SOSY
300.0000 mg | PREFILLED_SYRINGE | SUBCUTANEOUS | Status: AC
  Administered 2022-11-27 – 2024-06-03 (×20): 300 mg via SUBCUTANEOUS

## 2022-11-27 NOTE — Progress Notes (Signed)
Immunotherapy   Patient Details  Name: Melissa Jordan MRN: [id number] Date of Birth: 1946-05-15  11/27/2022  Melissa Jordan started injections for  Xolair Following schedule:  Every twenty eight days Frequency: Every four weeks. Epi-Pen:Prescription for Epi-Pen given Consent signed in office  and patient instructions given. Patient waited in room twenty seven for thirty minutes without an issue.   Ralene Muskrat 11/27/2022, 1:26 PM

## 2022-12-02 ENCOUNTER — Other Ambulatory Visit: Payer: Self-pay | Admitting: Family Medicine

## 2022-12-02 DIAGNOSIS — L258 Unspecified contact dermatitis due to other agents: Secondary | ICD-10-CM

## 2022-12-03 ENCOUNTER — Ambulatory Visit: Payer: Medicare Other | Admitting: Internal Medicine

## 2022-12-04 ENCOUNTER — Encounter: Payer: Self-pay | Admitting: Family Medicine

## 2022-12-04 ENCOUNTER — Ambulatory Visit: Payer: Medicare Other

## 2022-12-04 DIAGNOSIS — L258 Unspecified contact dermatitis due to other agents: Secondary | ICD-10-CM

## 2022-12-05 ENCOUNTER — Encounter: Payer: Self-pay | Admitting: Internal Medicine

## 2022-12-05 MED ORDER — HYDROXYZINE HCL 25 MG PO TABS: 25.0000 mg | ORAL_TABLET | Freq: Three times a day (TID) | ORAL | 1 refills | Status: DC | PRN

## 2022-12-05 NOTE — Telephone Encounter (Signed)
Yes I am okay with that. Would she like a round of prednisone (40 mg daily x 5 days)
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
## 2022-12-17 ENCOUNTER — Other Ambulatory Visit: Payer: Self-pay

## 2022-12-17 ENCOUNTER — Encounter: Payer: Self-pay | Admitting: Internal Medicine

## 2022-12-17 ENCOUNTER — Ambulatory Visit: Payer: Medicare Other | Admitting: Internal Medicine

## 2022-12-17 VITALS — BP 130/80 | HR 70 | Temp 98.1°F | Ht 63.0 in | Wt 153.2 lb

## 2022-12-17 DIAGNOSIS — Z91018 Allergy to other foods: Secondary | ICD-10-CM | POA: Diagnosis not present

## 2022-12-17 DIAGNOSIS — L501 Idiopathic urticaria: Secondary | ICD-10-CM

## 2022-12-17 NOTE — Patient Instructions (Addendum)
Chronic Idiopathic Urticaria: - this is defined as hives lasting more than 6 weeks without an identifiable trigger - hives can be from a number of different sources including infections, allergies, vibration, temperature, pressure among many others other possible causes - often an identifiable cause is not determined - some potential triggers include: stress, illness, NSAIDs, aspirin, hormonal changes - your alpha-gal testing was positive,  - approximately 50% of patients with chronic hives can have some associated swelling of the face/lips/eyelids (this is not a cause for alarm and does not typically progress onto systemic allergic reactions)  Therapy Plan:  - continue zyrtec (cetirizine) - can use up to  max dose of 20mg  (2 pills) twice daily- this is maximum dose - can increase or decrease dosing depending on symptom control to a maximum dose of 4 tablets of antihistamine daily. Wait until hives free for at least one month prior to decreasing dose.   - continue Xolair (omalizumab) per protocol.   Can use one of the following in place of zyrtec if desires: Claritin (loratadine) 10 mg, Xyzal (levocetirizine) 5 mg or Allegra (fexofenadine) 180 mg daily as needed  Alpha gal allergy:  - continue to avoid mammalian meat if you feel this helps with hives However, if you are not noticing much difference, you may be sensitized and not have a true allergy - for symptoms concerning for respiratory distress or abdominal symptoms with hives, please use your epipen and let us know.  Follow up : 3 months, sooner if needed It was a pleasure seeing you again in clinic today! Thank you for allowing me to participate in your care.  Tonny Bollman, MD [facility]

## 2022-12-18 ENCOUNTER — Encounter: Payer: Medicare Other | Admitting: Family Medicine

## 2022-12-23 ENCOUNTER — Ambulatory Visit (INDEPENDENT_AMBULATORY_CARE_PROVIDER_SITE_OTHER): Payer: Medicare Other

## 2022-12-23 DIAGNOSIS — L501 Idiopathic urticaria: Secondary | ICD-10-CM | POA: Diagnosis not present

## 2022-12-25 ENCOUNTER — Ambulatory Visit
Admission: RE | Admit: 2022-12-25 | Discharge: 2022-12-25 | Disposition: A | Discharge status: Home / Self Care | Payer: Medicare Other | Source: Ambulatory Visit | Attending: Internal Medicine | Admitting: Internal Medicine

## 2022-12-25 VITALS — BP 143/76 | HR 74 | Temp 98.1°F | Resp 20

## 2022-12-25 DIAGNOSIS — H1013 Acute atopic conjunctivitis, bilateral: Secondary | ICD-10-CM | POA: Diagnosis not present

## 2022-12-25 HISTORY — DX: Allergy to other foods: Z91.018

## 2022-12-25 MED ORDER — OLOPATADINE HCL 0.1 % OP SOLN: 1.0000 [drp] | Freq: Two times a day (BID) | OPHTHALMIC | 12 refills | Status: DC

## 2022-12-25 NOTE — ED Triage Notes (Signed)
C/O bilat eye pruritus, redness, drainage onset yesterday. C/O bilat blurred vision "because of the stuff running out of my eye".

## 2022-12-25 NOTE — Discharge Instructions (Signed)
Suspect that you have allergy related eye symptoms.  I have prescribed you an eyedrop to help alleviate symptoms.  Please follow-up if symptoms persist or worsen.
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
## 2022-12-30 ENCOUNTER — Ambulatory Visit (HOSPITAL_COMMUNITY): Payer: Medicare Other

## 2023-01-05 ENCOUNTER — Ambulatory Visit: Payer: Medicare Other | Admitting: Family Medicine

## 2023-01-05 ENCOUNTER — Encounter: Payer: Self-pay | Admitting: Family Medicine

## 2023-01-05 VITALS — BP 110/60 | HR 78 | Temp 97.8°F | Ht 61.0 in | Wt 154.0 lb

## 2023-01-05 DIAGNOSIS — J3489 Other specified disorders of nose and nasal sinuses: Secondary | ICD-10-CM

## 2023-01-05 DIAGNOSIS — H1032 Unspecified acute conjunctivitis, left eye: Secondary | ICD-10-CM | POA: Diagnosis not present

## 2023-01-05 DIAGNOSIS — R300 Dysuria: Secondary | ICD-10-CM | POA: Diagnosis not present

## 2023-01-05 DIAGNOSIS — R8290 Unspecified abnormal findings in urine: Secondary | ICD-10-CM | POA: Diagnosis not present

## 2023-01-05 LAB — POCT URINALYSIS DIPSTICK OB
Bilirubin, UA: NEGATIVE
Glucose, UA: NEGATIVE
Ketones, UA: NEGATIVE
Leukocytes, UA: NEGATIVE
Nitrite, UA: NEGATIVE
Spec Grav, UA: 1.025 (ref 1.010–1.025)
pH, UA: 6 (ref 5.0–8.0)

## 2023-01-05 LAB — POC COVID19 BINAXNOW: SARS Coronavirus 2 Ag: NEGATIVE

## 2023-01-05 MED ORDER — ERYTHROMYCIN 5 MG/GM OP OINT
1.0000 | TOPICAL_OINTMENT | Freq: Four times a day (QID) | OPHTHALMIC | 0 refills | Status: AC
Start: 2023-01-05 — End: 2023-01-12

## 2023-01-05 MED ORDER — AZITHROMYCIN 250 MG PO TABS
ORAL_TABLET | ORAL | 0 refills | Status: AC
Start: 2023-01-05 — End: 2023-01-10

## 2023-01-05 NOTE — Patient Instructions (Addendum)
-  It was a pleasure to care for you today.  -Rapid covid is negative.  -START Erythromycin application 4 times a day for the next 7 days.  -START Azithromycin 250mg  tablet, take 2 tablets today, 1 tablet 2nd through 5th day.  -Urinalysis showed only blood. Sending urine for culture. Office will call with lab results and you may see them in MyChart. -Follow up if not improved.

## 2023-01-05 NOTE — Addendum Note (Signed)
Addended by: Zandra Abts R on: 01/05/2023 04:41 PM   Modules accepted: Orders

## 2023-01-05 NOTE — Progress Notes (Signed)
Acute Office Visit   Subjective:  Patient ID: Melissa Jordan, female    DOB: [birth year], 77 y.o.   MRN: [id number]  Chief Complaint  Patient presents with   Eye Drainage    Pt reports 7/4 she had eye drainage Pt reports she was seen at Urgent care, [facility] Pt reports pressure,headache 2 days ago. Pt reports she tested for COVID 2 days ago. Pt reports she is have bad urine and burning sx, started yesterday.     HPI Patient is a 77 year old female that presents with left eye drainage. She was seen at [facility] on 12/25/2022. She was diagnosed with Allergic conjunctivitis of both eyes. Patient was prescribed Olopatadine drops. She reports the drainage did improve. Over the weekend, the drainage changed from clear to milky thick in the left eye only. Denies any blurry vision.  She reports she is experiencing a frontal headache and sinus pressure. She reports these symptoms 2 days ago. Describes headache as pressure. Constant, but intensity changes.  She reports she has took Tylenol with a little relief. She reports she did covid test at home 2 days ago, negative.  Reports she typically blows her nose a lot, but has not changed. Denies sore throat, ear pain or drainage, dizziness, lightheadedness, chest pain, SHOB, cough.     She is complaining of dysuria, bad odor to urine, and reports fatigue. Symptoms started yesterday.  Denies abd pain, no change in lower back pain, nausea, vomiting, and fever. Denies hematuria.  ROS See HPI above      Objective:   BP 110/60   Pulse 78   Temp 97.8 F (36.6 C)   Ht 5\' 1"  (1.549 m)   Wt 154 lb (69.9 kg)   SpO2 98%   BMI 29.10 kg/m    Physical Exam Vitals reviewed.  Constitutional:      General: She is not in acute distress.    Appearance: Normal appearance. She is not ill-appearing, toxic-appearing or diaphoretic.  HENT:     Head: Normocephalic and atraumatic.     Right Ear: Tympanic membrane,  ear canal and external ear normal. There is no impacted cerumen.     Left Ear: Tympanic membrane, ear canal and external ear normal. There is no impacted cerumen.     Nose:     Right Sinus: Maxillary sinus tenderness and frontal sinus tenderness present.     Left Sinus: Maxillary sinus tenderness and frontal sinus tenderness present.     Mouth/Throat:     Pharynx: Oropharynx is clear. Uvula midline. No pharyngeal swelling, oropharyngeal exudate, posterior oropharyngeal erythema, uvula swelling or postnasal drip.  Eyes:     General:        Right eye: No discharge.        Left eye: Discharge present.    Comments: Conjunctive and sclera is slightly red  Cardiovascular:     Rate and Rhythm: Normal rate and regular rhythm.     Heart sounds: Normal heart sounds. No murmur heard.    No friction rub. No gallop.  Pulmonary:     Effort: Pulmonary effort is normal. No respiratory distress.     Breath sounds: Normal breath sounds.  Abdominal:     Tenderness: There is no right CVA tenderness or left CVA tenderness.  Musculoskeletal:        General: Normal range of motion.  Skin:    General: Skin is warm and dry.  Neurological:  General: No focal deficit present.     Mental Status: She is alert and oriented to person, place, and time. Mental status is at baseline.  Psychiatric:        Mood and Affect: Mood normal.        Behavior: Behavior normal.        Thought Content: Thought content normal.        Judgment: Judgment normal.     Results for orders placed or performed in visit on 01/05/23  POC COVID-19  Result Value Ref Range   SARS Coronavirus 2 Ag Negative Negative  POC Urinalysis Dipstick OB  Result Value Ref Range   Color, UA     Clarity, UA     Glucose, UA Negative Negative   Bilirubin, UA neg    Ketones, UA neg    Spec Grav, UA 1.025 1.010 - 1.025   Blood, UA 1+    pH, UA 6.0 5.0 - 8.0   POC,PROTEIN,UA     Urobilinogen, UA     Nitrite, UA neg    Leukocytes, UA  Negative Negative   Appearance     Odor          Assessment & Plan:  Dysuria  Foul smelling urine -     POC COVID-19 BinaxNow -     POC Urinalysis Dipstick OB  Sinus pressure -     Azithromycin; Take 2 tablets on day 1, then 1 tablet daily on days 2 through 5  Dispense: 6 tablet; Refill: 0  Acute bacterial conjunctivitis of left eye -     Erythromycin; Place 1 Application into the left eye 4 (four) times daily for 7 days.  Dispense: 3.5 g; Refill: 0  -Rapid covid is negative.  -START Erythromycin application 4 times a day for the next 7 days for bacterial conjunctivitis.  -START Azithromycin 250mg  tablet, take 2 tablets today, 1 tablet 2nd through 5th day for sinus pressure that is more likely developing into a sinus infection.  -Urinalysis showed only blood. Sending urine for culture. Will treat accordingly to results.  -Follow up if not improved.   Zandra Abts, NP

## 2023-01-06 ENCOUNTER — Ambulatory Visit: Payer: Medicare Other | Admitting: Cardiology

## 2023-01-06 ENCOUNTER — Ambulatory Visit: Payer: Medicare Other | Admitting: Family Medicine

## 2023-01-07 ENCOUNTER — Telehealth: Payer: Self-pay

## 2023-01-07 LAB — URINE CULTURE
MICRO NUMBER:: 15200459
SPECIMEN QUALITY:: ADEQUATE

## 2023-01-07 NOTE — Telephone Encounter (Signed)
-----   Message from Zandra Abts sent at 01/07/2023  2:49 PM EDT -----  Azithromycin should cover E.coli based on susceptibility report. If not improved, follow up.

## 2023-01-07 NOTE — Telephone Encounter (Signed)
-----   Message from Zandra Abts sent at 01/07/2023  9:09 AM EDT ----- E. Coli was found on your urine culture. Awaiting sensitivity report.

## 2023-01-20 ENCOUNTER — Ambulatory Visit (INDEPENDENT_AMBULATORY_CARE_PROVIDER_SITE_OTHER): Payer: Medicare Other

## 2023-01-20 DIAGNOSIS — L501 Idiopathic urticaria: Secondary | ICD-10-CM | POA: Diagnosis not present

## 2023-01-21 ENCOUNTER — Encounter (INDEPENDENT_AMBULATORY_CARE_PROVIDER_SITE_OTHER): Payer: Self-pay

## 2023-02-08 ENCOUNTER — Encounter: Payer: Self-pay | Admitting: Orthopedic Surgery

## 2023-02-12 ENCOUNTER — Other Ambulatory Visit (INDEPENDENT_AMBULATORY_CARE_PROVIDER_SITE_OTHER): Payer: Medicare Other

## 2023-02-12 ENCOUNTER — Ambulatory Visit: Payer: Medicare Other | Admitting: Orthopedic Surgery

## 2023-02-12 DIAGNOSIS — M25572 Pain in left ankle and joints of left foot: Secondary | ICD-10-CM | POA: Diagnosis not present

## 2023-02-12 DIAGNOSIS — M76822 Posterior tibial tendinitis, left leg: Secondary | ICD-10-CM

## 2023-02-17 ENCOUNTER — Ambulatory Visit: Payer: Medicare Other | Admitting: *Deleted

## 2023-02-17 DIAGNOSIS — L501 Idiopathic urticaria: Secondary | ICD-10-CM | POA: Diagnosis not present

## 2023-02-19 ENCOUNTER — Ambulatory Visit: Payer: Medicare Other | Attending: Cardiology | Admitting: Cardiology

## 2023-02-19 VITALS — BP 116/72 | HR 73 | Ht 63.0 in | Wt 152.8 lb

## 2023-02-19 DIAGNOSIS — I10 Essential (primary) hypertension: Secondary | ICD-10-CM | POA: Diagnosis not present

## 2023-02-19 DIAGNOSIS — I480 Paroxysmal atrial fibrillation: Secondary | ICD-10-CM | POA: Diagnosis not present

## 2023-02-19 DIAGNOSIS — R600 Localized edema: Secondary | ICD-10-CM | POA: Diagnosis not present
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
## 2023-02-19 NOTE — Patient Instructions (Signed)
Medication Instructions:  No changes *If you need a refill on your cardiac medications before your next appointment, please call your pharmacy*  Testing/Procedures: Your physician has requested that you have a left lower extremity venous duplex. This test is an ultrasound of the veins in the legs or arms. It looks at venous blood flow that carries blood from the heart to the legs or arms. Allow one hour for a Lower Venous exam. Allow thirty minutes for an Upper Venous exam. There are no restrictions or special instructions.   Follow-Up: At [facility], you and your health needs are our priority.  As part of our continuing mission to provide you with exceptional heart care, we have created designated Provider Care Teams.  These Care Teams include your primary Cardiologist (physician) and Advanced Practice Providers (APPs -  Physician Assistants and Nurse Practitioners) who all work together to provide you with the care you need, when you need it.  We recommend signing up for the patient portal called "MyChart".  Sign up information is provided on this After Visit Summary.  MyChart is used to connect with patients for Virtual Visits (Telemedicine).  Patients are able to view lab/test results, encounter notes, upcoming appointments, etc.  Non-urgent messages can be sent to your provider as well.   To learn more about what you can do with MyChart, go to [email or web address].    Your next appointment:   6 month(s)  Provider:   Little Ishikawa, MD

## 2023-02-20 ENCOUNTER — Ambulatory Visit (INDEPENDENT_AMBULATORY_CARE_PROVIDER_SITE_OTHER): Payer: Medicare Other | Admitting: Family Medicine

## 2023-02-20 ENCOUNTER — Encounter: Payer: Self-pay | Admitting: Family Medicine

## 2023-02-20 VITALS — BP 114/74 | HR 65 | Temp 98.9°F | Resp 18 | Ht 63.0 in | Wt 152.5 lb

## 2023-02-20 DIAGNOSIS — I10 Essential (primary) hypertension: Secondary | ICD-10-CM | POA: Diagnosis not present

## 2023-02-20 DIAGNOSIS — Z0000 Encounter for general adult medical examination without abnormal findings: Secondary | ICD-10-CM | POA: Diagnosis not present

## 2023-02-20 DIAGNOSIS — M8580 Other specified disorders of bone density and structure, unspecified site: Secondary | ICD-10-CM | POA: Diagnosis not present

## 2023-02-20 LAB — BASIC METABOLIC PANEL
BUN: 17 mg/dL (ref 6–23)
CO2: 33 mEq/L — ABNORMAL HIGH (ref 19–32)
Calcium: 9.7 mg/dL (ref 8.4–10.5)
Chloride: 103 mEq/L (ref 96–112)
Creatinine, Ser: 0.91 mg/dL (ref 0.40–1.20)
GFR: 61.03 mL/min (ref >60.00)
Glucose, Bld: 73 mg/dL (ref 70–99)
Potassium: 4.3 mEq/L (ref 3.5–5.1)
Sodium: 142 mEq/L (ref 135–145)

## 2023-02-20 LAB — HEPATIC FUNCTION PANEL
ALT: 9 U/L (ref 0–35)
AST: 13 U/L (ref 0–37)
Albumin: 4 g/dL (ref 3.5–5.2)
Alkaline Phosphatase: 90 U/L (ref 39–117)
Bilirubin, Direct: 0.1 mg/dL (ref 0.0–0.3)
Total Bilirubin: 0.6 mg/dL (ref 0.2–1.2)
Total Protein: 6.1 g/dL (ref 6.0–8.3)

## 2023-02-20 LAB — LIPID PANEL
Cholesterol: 173 mg/dL (ref 0–200)
HDL: 53.5 mg/dL (ref >39.00)
LDL Cholesterol: 97 mg/dL (ref 0–99)
NonHDL: 119.02
Total CHOL/HDL Ratio: 3
Triglycerides: 109 mg/dL (ref 0.0–149.0)
VLDL: 21.8 mg/dL (ref 0.0–40.0)

## 2023-02-20 LAB — CBC WITH DIFFERENTIAL/PLATELET
Basophils Absolute: 0.1 10*3/uL (ref 0.0–0.1)
Basophils Relative: 0.8 % (ref 0.0–3.0)
Eosinophils Absolute: 0.2 10*3/uL (ref 0.0–0.7)
Eosinophils Relative: 2.9 % (ref 0.0–5.0)
HCT: 41.9 % (ref 36.0–46.0)
Hemoglobin: 13.4 g/dL (ref 12.0–15.0)
Lymphocytes Relative: 23.2 % (ref 12.0–46.0)
Lymphs Abs: 1.7 10*3/uL (ref 0.7–4.0)
MCHC: 32 g/dL (ref 30.0–36.0)
MCV: 89.5 fl (ref 78.0–100.0)
Monocytes Absolute: 0.8 10*3/uL (ref 0.1–1.0)
Monocytes Relative: 10.5 % (ref 3.0–12.0)
Neutro Abs: 4.6 10*3/uL (ref 1.4–7.7)
Neutrophils Relative %: 62.6 % (ref 43.0–77.0)
Platelets: 215 10*3/uL (ref 150.0–400.0)
RBC: 4.68 Mil/uL (ref 3.87–5.11)
RDW: 13.9 % (ref 11.5–15.5)
WBC: 7.4 10*3/uL (ref 4.0–10.5)

## 2023-02-20 LAB — TSH: TSH: 2.38 u[IU]/mL (ref 0.35–5.50)

## 2023-02-20 LAB — VITAMIN D 25 HYDROXY (VIT D DEFICIENCY, FRACTURES): VITD: 67.1 ng/mL (ref 30.00–100.00)

## 2023-02-20 NOTE — Assessment & Plan Note (Signed)
Pt's PE unchanged from previous and WNL w/ exception of intention tremor and swollen ankles.  Pt to schedule mammo.  Check labs.  Anticipatory guidance provided.

## 2023-02-20 NOTE — Progress Notes (Signed)
Subjective:    Patient ID: Melissa Jordan, female    DOB: Mar 08, 1946, 77 y.o.   MRN: [id number]  HPI CPE- no longer doing colon cancer screening.  UTD on PNA vaccine.  Plans to schedule mammo.  Patient Care Team    Relationship Specialty Notifications Start End  Sheliah Hatch, MD PCP - General Family Medicine  11/12/21   Little Ishikawa, MD PCP - Cardiology Cardiology Admissions 07/28/19   Lanier Prude, MD PCP - Electrophysiology Cardiology  07/07/22   Trey Sailors, MD Attending Physician Neurosurgery  05/15/14   Dannielle Huh, MD Consulting Physician Orthopedic Surgery  07/30/16   Liberty Handy, OD  Optometry  07/30/16   Kaylyn Layer  Dentistry  07/30/16   Rinaldo Cloud, MD Consulting Physician Cardiology  03/10/17   Erroll Luna, [facility] (Inactive)  Pharmacist  11/28/21    Comment: [phone]    Health Maintenance  Topic Date Due   MAMMOGRAM  06/11/2022   INFLUENZA VACCINE  01/22/2023   Medicare Annual Wellness (AWV)  05/08/2023   DTaP/Tdap/Td (4 - Td or Tdap) 03/24/2032   DEXA SCAN  Completed   HPV VACCINES  Aged Out   Pneumonia Vaccine 37+ Years old  Discontinued   Colonoscopy  Discontinued   COVID-19 Vaccine  Discontinued   Hepatitis C Screening  Discontinued   Zoster Vaccines- Shingrix  Discontinued      Review of Systems Patient reports no vision/ hearing changes, adenopathy,fever, weight change,  persistant/recurrent hoarseness , swallowing issues, chest pain, palpitations, persistant/recurrent cough, hemoptysis, dyspnea (rest/exertional/paroxysmal nocturnal), gastrointestinal bleeding (melena, rectal bleeding), abdominal pain, significant heartburn, bowel changes, GU symptoms (dysuria, hematuria, incontinence), Gyn symptoms (abnormal  bleeding, pain),  syncope, focal weakness, memory loss, numbness & tingling, skin/hair/nail changes, abnormal bruising or bleeding, anxiety, or depression.   + swelling of lower legs, L>R + tremor of L hand-  worse when holding objects    Objective:   Physical Exam General Appearance:    Alert, cooperative, no distress, appears stated age  Head:    Normocephalic, without obvious abnormality, atraumatic  Eyes:    PERRL, conjunctiva/corneas clear, EOM's intact both eyes  Ears:    Normal TM's and external ear canals, both ears  Nose:   Nares normal, septum midline, mucosa normal, no drainage    or sinus tenderness  Throat:   Lips, mucosa, and tongue normal; teeth and gums normal  Neck:   Supple, symmetrical, trachea midline, no adenopathy;    Thyroid: no enlargement/tenderness/nodules  Back:     Symmetric, no curvature, ROM normal, no CVA tenderness  Lungs:     Clear to auscultation bilaterally, respirations unlabored  Chest Wall:    No tenderness or deformity   Heart:    Regular rate and rhythm, S1 and S2 normal, no murmur, rub   or gallop  Breast Exam:    Deferred to mammo  Abdomen:     Soft, non-tender, bowel sounds active all four quadrants,    no masses, no organomegaly  Genitalia:    Deferred  Rectal:    Extremities:   Extremities normal, atraumatic, no cyanosis, ankles swollen bilaterally, L>R  Pulses:   2+ and symmetric all extremities  Skin:   Skin color, texture, turgor normal, no rashes or lesions  Lymph nodes:   Cervical, supraclavicular, and axillary nodes normal  Neurologic:   CNII-XII intact, normal strength, sensation and reflexes    throughout          Assessment & Plan:

## 2023-02-20 NOTE — Assessment & Plan Note (Signed)
Chronic problem.  Excellent control.  Currently asymptomatic.

## 2023-02-20 NOTE — Patient Instructions (Signed)
Follow up in 6 months to recheck BP (sooner if something comes up) We'll notify you of your lab results and make any changes if needed Keep up the good work on healthy diet and regular exercise- you're doing great! Schedule your mammogram Call with any questions or concerns Stay Safe!  Stay Healthy! Hang in there!!

## 2023-02-20 NOTE — Assessment & Plan Note (Signed)
Check Vit D and replete prn. 

## 2023-02-23 ENCOUNTER — Encounter: Payer: Self-pay | Admitting: Orthopedic Surgery
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
## 2023-02-24 ENCOUNTER — Telehealth: Payer: Self-pay

## 2023-02-24 NOTE — Telephone Encounter (Signed)
Left results on pt VM  

## 2023-02-24 NOTE — Telephone Encounter (Signed)
-----   Message from Neena Rhymes sent at 02/24/2023  7:22 AM EDT ----- Labs look great!  No changes at this time

## 2023-02-25 ENCOUNTER — Encounter (HOSPITAL_COMMUNITY): Payer: Self-pay

## 2023-02-25 ENCOUNTER — Ambulatory Visit (HOSPITAL_COMMUNITY)
Admission: RE | Admit: 2023-02-25 | Discharge: 2023-02-25 | Disposition: A | Discharge status: Home / Self Care | Payer: Medicare Other | Source: Ambulatory Visit | Attending: Cardiology | Admitting: Cardiology

## 2023-02-25 DIAGNOSIS — R600 Localized edema: Secondary | ICD-10-CM | POA: Diagnosis not present

## 2023-02-25 NOTE — Anesthesia Preprocedure Evaluation (Signed)
Anesthesia Evaluation  Patient identified by MRN, date of birth, ID band Patient awake    Reviewed: Allergy & Precautions, NPO status , Patient's Chart, lab work & pertinent test results  History of Anesthesia Complications (+) PONV and history of anesthetic complications  Airway Mallampati: I  TM Distance: >3 FB Neck ROM: Full    Dental  (+) Missing   Pulmonary neg pulmonary ROS   Pulmonary exam normal        Cardiovascular hypertension, Pt. on home beta blockers Normal cardiovascular exam+ dysrhythmias Atrial Fibrillation      Neuro/Psych   Anxiety      Neuromuscular disease    GI/Hepatic negative GI ROS, Neg liver ROS,,,  Endo/Other  Hypothyroidism    Renal/GU negative Renal ROS     Musculoskeletal  (+) Arthritis ,    Abdominal   Peds  Hematology negative hematology ROS (+)   Anesthesia Other Findings LUMBAR RADICULOPATHY  Reproductive/Obstetrics                              Anesthesia Physical Anesthesia Plan  ASA: 3  Anesthesia Plan: General   Post-op Pain Management:    Induction: Intravenous  PONV Risk Score and Plan: 4 or greater and Ondansetron, Dexamethasone, Midazolam, Amisulpride and Treatment may vary due to age or medical condition  Airway Management Planned: Oral ETT  Additional Equipment:   Intra-op Plan:   Post-operative Plan: Extubation in OR  Informed Consent: I have reviewed the patients History and Physical, chart, labs and discussed the procedure including the risks, benefits and alternatives for the proposed anesthesia with the patient or authorized representative who has indicated his/her understanding and acceptance.     Dental advisory given  Plan Discussed with: CRNA  Anesthesia Plan Comments: (PAT note written 02/25/2023 by Shonna Chock, PA-C.  )        Anesthesia Quick Evaluation
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
## 2023-02-25 NOTE — Progress Notes (Signed)
SDW call  Patient was given pre-op instructions over the phone. Patient verbalized understanding of instructions provided. Patient has a spinal cord stimulator that is MRI safe, instructed to bring remote to the hospital.    PCP - Dr. Neena Rhymes Cardiologist - Dr. Leatrice Jewels EP Cardiologist: Dr. Steffanie Dunn Pulmonary:    PPM/ICD - No.   Device Orders - n/a Rep Notified - n/a   Chest x-ray - 10/18/2022 EKG -  02/19/2023 Stress Test -  ECHO - 03/20/2022 Cardiac Cath -   Sleep Study/sleep apnea/CPAP: denies  Non diabetic   Blood Thinner Instructions: denies Aspirin Instructions: last dose 02/25/2023, instructed to hold the day of surgery   ERAS Protcol - Yes, clear fluids until 0500   COVID TEST- n/a    Anesthesia review: Yes. HTN, A-fib, watchman   Patient denies shortness of breath, fever, cough and chest pain over the phone call  Your procedure is scheduled on Thursday February 26, 2023  Report to [facility] Main Entrance "A" at  0530  A.M., then check in with the Admitting office.  Call this number if you have problems the morning of surgery:  [phone]   If you have any questions prior to your surgery date call [phone]: Open Monday-Friday 8am-4pm If you experience any cold or flu symptoms such as cough, fever, chills, shortness of breath, etc. between now and your scheduled surgery, please notify us at the above number     Remember:  Do not eat after midnight the night before your surgery  You may drink clear liquids until 0500  the morning of your surgery.   Clear liquids allowed are: Water, Non-Citrus Juices (without pulp), Carbonated Beverages, Clear Tea, Black Coffee ONLY (NO MILK, CREAM OR POWDERED CREAMER of any kind), and Gatorade   Take these medicines the morning of surgery with A SIP OF WATER:  Estradiol, levothyroxine, loratadine  As needed: Tylenol, robaxin, tramadol  As of today, STOP taking any Aspirin (unless otherwise  instructed by your surgeon) Aleve, Naproxen, Ibuprofen, Motrin, Advil, Goody's, BC's, all herbal medications, fish oil, and all vitamins.

## 2023-02-26 ENCOUNTER — Encounter (HOSPITAL_COMMUNITY): Payer: Medicare Other

## 2023-02-26 ENCOUNTER — Ambulatory Visit (HOSPITAL_COMMUNITY)
Admission: RE | Admit: 2023-02-26 | Discharge: 2023-02-26 | Disposition: A | Discharge status: Home / Self Care | Payer: Medicare Other | Source: Ambulatory Visit | Attending: Neurological Surgery | Admitting: Neurological Surgery

## 2023-02-26 ENCOUNTER — Encounter (HOSPITAL_COMMUNITY)
Admission: RE | Disposition: A | Discharge status: Home / Self Care | Payer: Self-pay | Source: Home / Self Care | Attending: Cardiology

## 2023-02-26 ENCOUNTER — Ambulatory Visit (HOSPITAL_COMMUNITY)
Admission: RE | Admit: 2023-02-26 | Discharge: 2023-02-26 | Disposition: A | Discharge status: Home / Self Care | Payer: Medicare Other | Attending: Cardiology | Admitting: Cardiology

## 2023-02-26 ENCOUNTER — Ambulatory Visit (HOSPITAL_BASED_OUTPATIENT_CLINIC_OR_DEPARTMENT_OTHER): Payer: Medicare Other | Admitting: Vascular Surgery

## 2023-02-26 ENCOUNTER — Ambulatory Visit (HOSPITAL_COMMUNITY): Payer: Self-pay | Admitting: Vascular Surgery

## 2023-02-26 DIAGNOSIS — M4727 Other spondylosis with radiculopathy, lumbosacral region: Secondary | ICD-10-CM | POA: Diagnosis not present

## 2023-02-26 DIAGNOSIS — I10 Essential (primary) hypertension: Secondary | ICD-10-CM

## 2023-02-26 DIAGNOSIS — Z981 Arthrodesis status: Secondary | ICD-10-CM | POA: Insufficient documentation

## 2023-02-26 DIAGNOSIS — I4891 Unspecified atrial fibrillation: Secondary | ICD-10-CM | POA: Diagnosis not present

## 2023-02-26 DIAGNOSIS — M48061 Spinal stenosis, lumbar region without neurogenic claudication: Secondary | ICD-10-CM | POA: Insufficient documentation

## 2023-02-26 DIAGNOSIS — M47817 Spondylosis without myelopathy or radiculopathy, lumbosacral region: Secondary | ICD-10-CM | POA: Diagnosis not present

## 2023-02-26 DIAGNOSIS — E039 Hypothyroidism, unspecified: Secondary | ICD-10-CM | POA: Diagnosis not present

## 2023-02-26 DIAGNOSIS — M5416 Radiculopathy, lumbar region: Secondary | ICD-10-CM | POA: Diagnosis not present

## 2023-02-26 DIAGNOSIS — E785 Hyperlipidemia, unspecified: Secondary | ICD-10-CM | POA: Diagnosis not present

## 2023-02-26 DIAGNOSIS — M5116 Intervertebral disc disorders with radiculopathy, lumbar region: Secondary | ICD-10-CM | POA: Diagnosis not present

## 2023-02-26 DIAGNOSIS — M4726 Other spondylosis with radiculopathy, lumbar region: Secondary | ICD-10-CM | POA: Diagnosis not present

## 2023-02-26 DIAGNOSIS — M5115 Intervertebral disc disorders with radiculopathy, thoracolumbar region: Secondary | ICD-10-CM | POA: Diagnosis not present

## 2023-02-26 HISTORY — PX: RADIOLOGY WITH ANESTHESIA: SHX6223

## 2023-02-26 SURGERY — MRI WITH ANESTHESIA
Anesthesia: General

## 2023-02-26 MED ORDER — EPHEDRINE SULFATE-NACL 50-0.9 MG/10ML-% IV SOSY
PREFILLED_SYRINGE | INTRAVENOUS | Status: DC | PRN
  Administered 2023-02-26: 5 mg via INTRAVENOUS

## 2023-02-26 MED ORDER — ONDANSETRON HCL 4 MG/2ML IJ SOLN
4.0000 mg | Freq: Once | INTRAMUSCULAR | Status: AC | PRN
  Administered 2023-02-26: 4 mg via INTRAVENOUS

## 2023-02-26 MED ORDER — LIDOCAINE 2% (20 MG/ML) 5 ML SYRINGE
INTRAMUSCULAR | Status: DC | PRN
  Administered 2023-02-26: 60 mg via INTRAVENOUS

## 2023-02-26 MED ORDER — FENTANYL CITRATE (PF) 250 MCG/5ML IJ SOLN
INTRAMUSCULAR | Status: DC | PRN
  Administered 2023-02-26: 100 ug via INTRAVENOUS

## 2023-02-26 MED ORDER — PHENYLEPHRINE HCL-NACL 20-0.9 MG/250ML-% IV SOLN
INTRAVENOUS | Status: DC | PRN
  Administered 2023-02-26: 20 ug/min via INTRAVENOUS

## 2023-02-26 MED ORDER — FENTANYL CITRATE (PF) 100 MCG/2ML IJ SOLN: 25.0000 ug | INTRAMUSCULAR | Status: DC | PRN

## 2023-02-26 MED ORDER — AMISULPRIDE (ANTIEMETIC) 5 MG/2ML IV SOLN
INTRAVENOUS | Status: AC
  Filled 2023-02-26: qty 4

## 2023-02-26 MED ORDER — AMISULPRIDE (ANTIEMETIC) 5 MG/2ML IV SOLN: 10.0000 mg | Freq: Once | INTRAVENOUS | Status: DC | PRN

## 2023-02-26 MED ORDER — CHLORHEXIDINE GLUCONATE 0.12 % MT SOLN
15.0000 mL | Freq: Once | OROMUCOSAL | Status: AC
  Administered 2023-02-26: 15 mL via OROMUCOSAL
  Filled 2023-02-26: qty 15

## 2023-02-26 MED ORDER — AMISULPRIDE (ANTIEMETIC) 5 MG/2ML IV SOLN
INTRAVENOUS | Status: DC | PRN
Start: 2023-02-26 — End: 2023-02-26
  Administered 2023-02-26: 5 mg via INTRAVENOUS

## 2023-02-26 MED ORDER — ROCURONIUM BROMIDE 10 MG/ML (PF) SYRINGE
PREFILLED_SYRINGE | INTRAVENOUS | Status: DC | PRN
  Administered 2023-02-26: 30 mg via INTRAVENOUS

## 2023-02-26 MED ORDER — ACETAMINOPHEN 10 MG/ML IV SOLN: 1000.0000 mg | Freq: Once | INTRAVENOUS | Status: DC | PRN

## 2023-02-26 MED ORDER — LACTATED RINGERS IV SOLN: INTRAVENOUS | Status: DC

## 2023-02-26 MED ORDER — ORAL CARE MOUTH RINSE: 15.0000 mL | Freq: Once | OROMUCOSAL | Status: AC

## 2023-02-26 MED ORDER — KETOROLAC TROMETHAMINE 15 MG/ML IJ SOLN: 15.0000 mg | Freq: Once | INTRAMUSCULAR | Status: DC | PRN

## 2023-02-26 MED ORDER — SUGAMMADEX SODIUM 200 MG/2ML IV SOLN
INTRAVENOUS | Status: DC | PRN
  Administered 2023-02-26: 200 mg via INTRAVENOUS

## 2023-02-26 MED ORDER — DEXAMETHASONE SODIUM PHOSPHATE 10 MG/ML IJ SOLN
INTRAMUSCULAR | Status: DC | PRN
  Administered 2023-02-26: 5 mg via INTRAVENOUS

## 2023-02-26 MED ORDER — FENTANYL CITRATE (PF) 100 MCG/2ML IJ SOLN
INTRAMUSCULAR | Status: AC
  Filled 2023-02-26: qty 2

## 2023-02-26 MED ORDER — PROPOFOL 10 MG/ML IV BOLUS
INTRAVENOUS | Status: DC | PRN
  Administered 2023-02-26: 100 mg via INTRAVENOUS
  Administered 2023-02-26: 50 mg via INTRAVENOUS

## 2023-02-26 MED ORDER — ONDANSETRON HCL 4 MG/2ML IJ SOLN
INTRAMUSCULAR | Status: AC
  Filled 2023-02-26: qty 2

## 2023-02-26 MED ORDER — ONDANSETRON HCL 4 MG/2ML IJ SOLN
INTRAMUSCULAR | Status: DC | PRN
  Administered 2023-02-26: 4 mg via INTRAVENOUS

## 2023-02-26 NOTE — H&P (Signed)
Anesthesia H&P Update: History and Physical Exam reviewed; patient is OK for planned anesthetic and procedure. ? ?

## 2023-02-26 NOTE — Transfer of Care (Signed)
Immediate Anesthesia Transfer of Care Note  Patient: Melissa Jordan  Procedure(s) Performed: MRI WITH ANESTHESIA OF LUMBAR SPINE WITHOUT CONTRAST  Patient Location: PACU  Anesthesia Type:General  Level of Consciousness: awake  Airway & Oxygen Therapy: Patient Spontanous Breathing  Post-op Assessment: Report given to RN and Post -op Vital signs reviewed and stable  Post vital signs: Reviewed and stable  Last Vitals:  Vitals Value Taken Time  BP 146/80 02/26/23 0846  Temp 36.7 C 02/26/23 0846  Pulse 67 02/26/23 0854  Resp 17 02/26/23 0854  SpO2 100 % 02/26/23 0854  Vitals shown include unfiled device data.  Last Pain:  Vitals:   02/26/23 0616  TempSrc: Oral  PainSc: 0-No pain         Complications: No notable events documented.

## 2023-02-26 NOTE — Anesthesia Procedure Notes (Signed)
Procedure Name: Intubation Date/Time: 02/26/2023 7:52 AM  Performed by: Loleta Monik Lins, CRNAPre-anesthesia Checklist: Patient identified, Patient being monitored, Timeout performed, Emergency Drugs available and Suction available Patient Re-evaluated:Patient Re-evaluated prior to induction Oxygen Delivery Method: Circle system utilized Preoxygenation: Pre-oxygenation with 100% oxygen Induction Type: IV induction Ventilation: Mask ventilation without difficulty Laryngoscope Size: Mac and 4 Grade View: Grade II Tube type: Oral Tube size: 7.0 mm Number of attempts: 1 Airway Equipment and Method: Stylet Placement Confirmation: ETT inserted through vocal cords under direct vision, positive ETCO2 and breath sounds checked- equal and bilateral Secured at: 21 cm Tube secured with: Tape Dental Injury: Teeth and Oropharynx as per pre-operative assessment and Injury to lip

## 2023-02-26 NOTE — Anesthesia Postprocedure Evaluation (Signed)
Anesthesia Post Note  Patient: LUCENDIA WALIGORSKI  Procedure(s) Performed: MRI WITH ANESTHESIA OF LUMBAR SPINE WITHOUT CONTRAST     Patient location during evaluation: PACU Anesthesia Type: General Level of consciousness: awake Pain management: pain level controlled Vital Signs Assessment: post-procedure vital signs reviewed and stable Respiratory status: spontaneous breathing, nonlabored ventilation and respiratory function stable Cardiovascular status: blood pressure returned to baseline and stable Postop Assessment: no apparent nausea or vomiting Anesthetic complications: no   No notable events documented.  Last Vitals:  Vitals:   02/26/23 0945 02/26/23 1000  BP: (!) 128/59 122/68  Pulse: 62 61  Resp: 16 16  Temp:  36.9 C  SpO2: 98% 100%    Last Pain:  Vitals:   02/26/23 0846  TempSrc:   PainSc: 0-No pain                 Donnis Pecha P Allycia Pitz

## 2023-02-27 ENCOUNTER — Encounter (HOSPITAL_COMMUNITY): Payer: Self-pay

## 2023-02-27 ENCOUNTER — Encounter (HOSPITAL_COMMUNITY): Payer: Self-pay | Admitting: Radiology

## 2023-02-27 ENCOUNTER — Ambulatory Visit (HOSPITAL_COMMUNITY): Payer: Medicare Other

## 2023-03-09 ENCOUNTER — Ambulatory Visit: Payer: Medicare Other | Admitting: Surgical

## 2023-03-09 ENCOUNTER — Encounter: Payer: Self-pay | Admitting: Surgical

## 2023-03-09 DIAGNOSIS — M25572 Pain in left ankle and joints of left foot: Secondary | ICD-10-CM
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
## 2023-03-12 ENCOUNTER — Ambulatory Visit
Admission: RE | Admit: 2023-03-12 | Discharge: 2023-03-12 | Disposition: A | Discharge status: Home / Self Care | Payer: Medicare Other | Source: Ambulatory Visit | Attending: Surgical | Admitting: Surgical

## 2023-03-12 ENCOUNTER — Ambulatory Visit: Payer: Medicare Other | Admitting: Orthopedic Surgery

## 2023-03-12 DIAGNOSIS — M79672 Pain in left foot: Secondary | ICD-10-CM | POA: Diagnosis not present

## 2023-03-12 DIAGNOSIS — M19072 Primary osteoarthritis, left ankle and foot: Secondary | ICD-10-CM | POA: Diagnosis not present

## 2023-03-12 DIAGNOSIS — M25572 Pain in left ankle and joints of left foot: Secondary | ICD-10-CM

## 2023-03-16 ENCOUNTER — Ambulatory Visit: Payer: Medicare Other | Admitting: Orthopedic Surgery

## 2023-03-16 DIAGNOSIS — M5440 Lumbago with sciatica, unspecified side: Secondary | ICD-10-CM | POA: Diagnosis not present

## 2023-03-16 DIAGNOSIS — G894 Chronic pain syndrome: Secondary | ICD-10-CM | POA: Diagnosis not present

## 2023-03-17 ENCOUNTER — Ambulatory Visit (INDEPENDENT_AMBULATORY_CARE_PROVIDER_SITE_OTHER): Payer: Medicare Other

## 2023-03-17 DIAGNOSIS — L501 Idiopathic urticaria: Secondary | ICD-10-CM

## 2023-03-23 ENCOUNTER — Other Ambulatory Visit: Payer: Self-pay

## 2023-03-23 ENCOUNTER — Ambulatory Visit: Payer: Medicare Other | Admitting: Internal Medicine

## 2023-03-23 ENCOUNTER — Encounter: Payer: Self-pay | Admitting: Internal Medicine

## 2023-03-23 VITALS — BP 128/56 | HR 62 | Temp 98.3°F | Ht 63.0 in | Wt 152.8 lb

## 2023-03-23 DIAGNOSIS — L508 Other urticaria: Secondary | ICD-10-CM | POA: Diagnosis not present

## 2023-03-23 DIAGNOSIS — Z91018 Allergy to other foods: Secondary | ICD-10-CM | POA: Diagnosis not present

## 2023-03-23 NOTE — Progress Notes (Signed)
FOLLOW UP Date of Service/Encounter:  03/23/23  Subjective:  Melissa Jordan (DOB: Nov 10, 1945) is a 77 y.o. female who [facility] in re-evaluation of the following: chronic urticaria History obtained from: chart review and patient.  For Review, LV was on 12/17/22  with Dr.Keifer Habib seen for routine follow-up. See below for summary of history and diagnostics.   Therapeutic plans/changes recommended: Xolair working well.Feels avoiding mammalian meat helps reduce hives. Has had associated loose stools and stomach pain with accidental exposure to mammalian meat. Continued on xolair and high-dose antihistamines. ----------------------------------------------------- Pertinent History/Diagnostics:  Urticaria:  Prior to January 2024, had some blistery hives which were similar but not exactly the same as current rash.  Treated for viral illness and given prednisone. Resolved after stopping plavix. February/March 2024, she started getting little groups of hives. They would stay in same spot for a few days. No bruising. Occurring daily. Had Covid around time hives first appeared. Evaluated by Dermatology 10/13/22:  Plan: urticaria-clobetasol, AH PRN, finish course prednisone, FU AI; purpura: benign, OTC arnica and observe 2 ED visits -4/12, 4/13 and -04/14 "allergic dermatitis"; tx-depomedrol, prednisone, benadryl, atarax 10/18/22: CMP with elevated creatinine and BUN, low total protein, CBCd - elevated WBC 22 with left shift ANC 19.1 - Xolair first dose 11/27/22 - 10/22/22: normal tryptase 3.2, CU index 5.6, normal thyroid levels, positive alpha-gal--recommended diet free of mammalian meat 12/17/22: Singluar discontinued. Not effective. --------------------------------------------------- Today presents for follow-up. Discussed the use of AI scribe software for clinical note transcription with the patient, who gave verbal consent to proceed.  History of  Present Illness             Chart Review: Last xolair 03/17/23.  All medications reviewed by clinical staff and updated in chart. No new pertinent medical or surgical history except as noted in HPI.  ROS: All others negative except as noted per HPI.   Objective:  BP (!) 128/56   Pulse 62   Temp 98.3 F (36.8 C) (Temporal)   Ht 5\' 3"  (1.6 m)   Wt 152 lb 12.8 oz (69.3 kg)   SpO2 98%   BMI 27.07 kg/m  Body mass index is 27.07 kg/m. Physical Exam: General Appearance:  Alert, cooperative, no distress, appears stated age  Head:  Normocephalic, without obvious abnormality, atraumatic  Eyes:  Conjunctiva clear, EOM's intact  Ears EACs normal bilaterally and normal TMs bilaterally  Nose: Nares normal, normal mucosa and no visible anterior polyps  Throat: Lips, tongue normal; teeth and gums normal, normal posterior oropharynx  Neck: Supple, symmetrical  Lungs:   clear to auscultation bilaterally, Respirations unlabored, no coughing  Heart:  regular rate and rhythm and no murmur, Appears well perfused  Extremities: Significant edema bilateral ankles, left significantly more swollen than right  Skin: Skin color, texture, turgor normal and no rashes or lesions on visualized portions of skin  Neurologic: No gross deficits   Labs:  Lab Orders  No laboratory test(s) ordered today   Assessment/Plan   Chronic Urticaria: Unclear if solely related to positive alpha gal testing/allergy.  Tolerating Xolair well which she finds helpful.  Has upcoming surgery and is afraid to discontinue Xolair.  Discussed that Xolair is also approved for food allergy, and she wishes to continue at current dosing. Therapy Plan:  -Continue daily Claritin. Can increase up to 4 tablets during a flare. -Use Hydroxyzine at night if needed for flare, mindful of potential drowsiness and fall risk. - continue Xolair (  omalizumab) 300 mg q4 weeks.  Can use one of the following in place of zyrtec if desires:  Claritin (loratadine) 10 mg, Xyzal (levocetirizine) 5 mg or Allegra (fexofenadine) 180 mg daily as needed  Alpha gal allergy:  Minimal hives since last visit. Mild outbreak prior to last Xolair injection due to consumption of mammalian meat byproducts (ice cream and cheese). Did not require EpiPen. Patient is on daily Claritin and has taken Hydroxyzine once at night for a flare of hives. - continue to avoid mammalian meat  - for symptoms concerning for respiratory distress or abdominal symptoms with hives, please use your epipen and let us know.  Follow up : 6 months, sooner if needed It was a pleasure seeing you again in clinic today! Thank you for allowing me to participate in your care.  Other: none  Tonny Bollman, MD  [facility]

## 2023-03-23 NOTE — Patient Instructions (Addendum)
Chronic Urticaria: Therapy Plan:  -Continue daily Claritin. Can increase up to 4 tablets during a flare. -Use Hydroxyzine at night if needed for flare, mindful of potential drowsiness and fall risk. - continue Xolair (omalizumab) 300 mg q4 weeks.  Can use one of the following in place of zyrtec if desires: Claritin (loratadine) 10 mg, Xyzal (levocetirizine) 5 mg or Allegra (fexofenadine) 180 mg daily as needed  Alpha gal allergy:  - continue to avoid mammalian meat  - for symptoms concerning for respiratory distress or abdominal symptoms with hives, please use your epipen and let us know.  Follow up : 6 months, sooner if needed It was a pleasure seeing you again in clinic today! Thank you for allowing me to participate in your care.  Tonny Bollman, MD [facility]

## 2023-03-25 ENCOUNTER — Encounter: Payer: Self-pay | Admitting: Surgical

## 2023-03-25 ENCOUNTER — Ambulatory Visit: Payer: Medicare Other | Admitting: Surgical

## 2023-03-25 DIAGNOSIS — M25572 Pain in left ankle and joints of left foot: Secondary | ICD-10-CM | POA: Diagnosis not present

## 2023-03-26 ENCOUNTER — Ambulatory Visit: Payer: Medicare Other

## 2023-03-26 DIAGNOSIS — Z0000 Encounter for general adult medical examination without abnormal findings: Secondary | ICD-10-CM | POA: Diagnosis not present

## 2023-03-26 NOTE — Patient Instructions (Signed)
Melissa Jordan , Thank you for taking time to come for your Medicare Wellness Visit. I appreciate your ongoing commitment to your health goals. Please review the following plan we discussed and let me know if I can assist you in the future.   Screening recommendations/referrals: Colonoscopy: no longer required Mammogram: Education provided Bone Density: Education provided Recommended yearly ophthalmology/optometry visit for glaucoma screening and checkup Recommended yearly dental visit for hygiene and checkup  Vaccinations: Influenza vaccine: Education provided Pneumococcal vaccine: Education provided Tdap vaccine: Education provided     Preventive Care 65 Years and Older, Female Preventive care refers to lifestyle choices and visits with your health care provider that can promote health and wellness. What does preventive care include? A yearly physical exam. This is also called an annual well check. Dental exams once or twice a year. Routine eye exams. Ask your health care provider how often you should have your eyes checked. Personal lifestyle choices, including: Daily care of your teeth and gums. Regular physical activity. Eating a healthy diet. Avoiding tobacco and drug use. Limiting alcohol use. Practicing safe sex. Taking low-dose aspirin every day. Taking vitamin and mineral supplements as recommended by your health care provider. What happens during an annual well check? The services and screenings done by your health care provider during your annual well check will depend on your age, overall health, lifestyle risk factors, and family history of disease. Counseling  Your health care provider may ask you questions about your: Alcohol use. Tobacco use. Drug use. Emotional well-being. Home and relationship well-being. Sexual activity. Eating habits. History of falls. Memory and ability to understand (cognition). Work and work Astronomer. Reproductive  health. Screening  You may have the following tests or measurements: Height, weight, and BMI. Blood pressure. Lipid and cholesterol levels. These may be checked every 5 years, or more frequently if you are over 35 years old. Skin check. Lung cancer screening. You may have this screening every year starting at age 53 if you have a 30-pack-year history of smoking and currently smoke or have quit within the past 15 years. Fecal occult blood test (FOBT) of the stool. You may have this test every year starting at age 56. Flexible sigmoidoscopy or colonoscopy. You may have a sigmoidoscopy every 5 years or a colonoscopy every 10 years starting at age 95. Hepatitis C blood test. Hepatitis B blood test. Sexually transmitted disease (STD) testing. Diabetes screening. This is done by checking your blood sugar (glucose) after you have not eaten for a while (fasting). You may have this done every 1-3 years. Bone density scan. This is done to screen for osteoporosis. You may have this done starting at age 71. Mammogram. This may be done every 1-2 years. Talk to your health care provider about how often you should have regular mammograms. Talk with your health care provider about your test results, treatment options, and if necessary, the need for more tests. Vaccines  Your health care provider may recommend certain vaccines, such as: Influenza vaccine. This is recommended every year. Tetanus, diphtheria, and acellular pertussis (Tdap, Td) vaccine. You may need a Td booster every 10 years. Zoster vaccine. You may need this after age 19. Pneumococcal 13-valent conjugate (PCV13) vaccine. One dose is recommended after age 89. Pneumococcal polysaccharide (PPSV23) vaccine. One dose is recommended after age 36. Talk to your health care provider about which screenings and vaccines you need and how often you need them. This information is not intended to replace advice given to you by  your health care provider.  Make sure you discuss any questions you have with your health care provider. Document Released: 07/06/2015 Document Revised: 02/27/2016 Document Reviewed: 04/10/2015 Elsevier Interactive Patient Education  2017 [employer].  Fall Prevention in the Home Falls can cause injuries. They can happen to people of all ages. There are many things you can do to make your home safe and to help prevent falls. What can I do on the outside of my home? Regularly fix the edges of walkways and driveways and fix any cracks. Remove anything that might make you trip as you walk through a door, such as a raised step or threshold. Trim any bushes or trees on the path to your home. Use bright outdoor lighting. Clear any walking paths of anything that might make someone trip, such as rocks or tools. Regularly check to see if handrails are loose or broken. Make sure that both sides of any steps have handrails. Any raised decks and porches should have guardrails on the edges. Have any leaves, snow, or ice cleared regularly. Use sand or salt on walking paths during winter. Clean up any spills in your garage right away. This includes oil or grease spills. What can I do in the bathroom? Use night lights. Install grab bars by the toilet and in the tub and shower. Do not use towel bars as grab bars. Use non-skid mats or decals in the tub or shower. If you need to sit down in the shower, use a plastic, non-slip stool. Keep the floor dry. Clean up any water that spills on the floor as soon as it happens. Remove soap buildup in the tub or shower regularly. Attach bath mats securely with double-sided non-slip rug tape. Do not have throw rugs and other things on the floor that can make you trip. What can I do in the bedroom? Use night lights. Make sure that you have a light by your bed that is easy to reach. Do not use any sheets or blankets that are too big for your bed. They should not hang down onto the floor. Have a  firm chair that has side arms. You can use this for support while you get dressed. Do not have throw rugs and other things on the floor that can make you trip. What can I do in the kitchen? Clean up any spills right away. Avoid walking on wet floors. Keep items that you use a lot in easy-to-reach places. If you need to reach something above you, use a strong step stool that has a grab bar. Keep electrical cords out of the way. Do not use floor polish or wax that makes floors slippery. If you must use wax, use non-skid floor wax. Do not have throw rugs and other things on the floor that can make you trip. What can I do with my stairs? Do not leave any items on the stairs. Make sure that there are handrails on both sides of the stairs and use them. Fix handrails that are broken or loose. Make sure that handrails are as long as the stairways. Check any carpeting to make sure that it is firmly attached to the stairs. Fix any carpet that is loose or worn. Avoid having throw rugs at the top or bottom of the stairs. If you do have throw rugs, attach them to the floor with carpet tape. Make sure that you have a light switch at the top of the stairs and the bottom of the stairs. If you  do not have them, ask someone to add them for you. What else can I do to help prevent falls? Wear shoes that: Do not have high heels. Have rubber bottoms. Are comfortable and fit you well. Are closed at the toe. Do not wear sandals. If you use a stepladder: Make sure that it is fully opened. Do not climb a closed stepladder. Make sure that both sides of the stepladder are locked into place. Ask someone to hold it for you, if possible. Clearly mark and make sure that you can see: Any grab bars or handrails. First and last steps. Where the edge of each step is. Use tools that help you move around (mobility aids) if they are needed. These include: Canes. Walkers. Scooters. Crutches. Turn on the lights when you  go into a dark area. Replace any light bulbs as soon as they burn out. Set up your furniture so you have a clear path. Avoid moving your furniture around. If any of your floors are uneven, fix them. If there are any pets around you, be aware of where they are. Review your medicines with your doctor. Some medicines can make you feel dizzy. This can increase your chance of falling. Ask your doctor what other things that you can do to help prevent falls. This information is not intended to replace advice given to you by your health care provider. Make sure you discuss any questions you have with your health care provider. Document Released: 04/05/2009 Document Revised: 11/15/2015 Document Reviewed: 07/14/2014 Elsevier Interactive Patient Education  2017 [employer].
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
## 2023-03-29 ENCOUNTER — Encounter: Payer: Self-pay | Admitting: Surgical
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
## 2023-04-01 ENCOUNTER — Telehealth: Payer: Self-pay | Admitting: Orthopedic Surgery

## 2023-04-01 NOTE — Telephone Encounter (Signed)
Records faxed to Dr. Victorino Dike [phone], we referred patient there for 2nd opinion.

## 2023-04-13 DIAGNOSIS — G894 Chronic pain syndrome: Secondary | ICD-10-CM | POA: Diagnosis not present

## 2023-04-13 DIAGNOSIS — Z9689 Presence of other specified functional implants: Secondary | ICD-10-CM | POA: Diagnosis not present

## 2023-04-14 ENCOUNTER — Ambulatory Visit: Payer: Medicare Other | Admitting: *Deleted

## 2023-04-14 DIAGNOSIS — L501 Idiopathic urticaria: Secondary | ICD-10-CM

## 2023-05-01 DIAGNOSIS — M79672 Pain in left foot: Secondary | ICD-10-CM | POA: Diagnosis not present

## 2023-05-01 DIAGNOSIS — T8484XA Pain due to internal orthopedic prosthetic devices, implants and grafts, initial encounter: Secondary | ICD-10-CM | POA: Diagnosis not present

## 2023-05-01 DIAGNOSIS — S92102K Unspecified fracture of left talus, subsequent encounter for fracture with nonunion: Secondary | ICD-10-CM | POA: Diagnosis not present

## 2023-05-01 DIAGNOSIS — T849XXA Unspecified complication of internal orthopedic prosthetic device, implant and graft, initial encounter: Secondary | ICD-10-CM | POA: Insufficient documentation

## 2023-05-01 DIAGNOSIS — S92102A Unspecified fracture of left talus, initial encounter for closed fracture: Secondary | ICD-10-CM | POA: Insufficient documentation

## 2023-05-04 ENCOUNTER — Telehealth: Payer: Self-pay | Admitting: *Deleted

## 2023-05-04 NOTE — Telephone Encounter (Signed)
Pt has been scheduled a tele pre op appt 06/09/23. Med rec and consent are done. Pt states surgery to be early Jan 2025.

## 2023-05-04 NOTE — Telephone Encounter (Signed)
   Name: Melissa Jordan  DOB: Dec 26, 1945  MRN: [id number]  Primary Cardiologist: Little Ishikawa, MD   Preoperative team, please contact this patient and set up a phone call appointment for further preoperative risk assessment. Please obtain consent and complete medication review. Thank you for your help.  I confirm that guidance regarding antiplatelet and oral anticoagulation therapy has been completed and, if necessary, noted below.  Ideally aspirin should be continued without interruption, however if the bleeding risk is too great, aspirin may be held for 5-7 days prior to surgery. Please resume aspirin post operatively when it is felt to be safe from a bleeding standpoint.    I also confirmed the patient resides in the state of [place]. As per [facility] Medical Board telemedicine laws, the patient must reside in the state in which the provider is licensed.   Carlos Levering, NP 05/04/2023, 9:54 AM [facility]

## 2023-05-04 NOTE — Telephone Encounter (Signed)
   Pre-operative Risk Assessment    Patient Name: Melissa Jordan  DOB: 03-Jun-1946 MRN: [id number]  DATE OF LAST VISIT: 02/19/23 DR. CHRISTOPHER SCHUMANN DATE OF NEXT VISIT: NONE    Request for Surgical Clearance    Procedure:   REMOVAL OF DEEP IMPLANTS LEFT TALONAVICULAR AND SUBTALAR JOINTS. REVISION LEFT SUBTALAR AND TALONAVICULAR JOINT ARTHRODESES   Date of Surgery:  Clearance TBD  (06/2023)                               Surgeon:  DR. Jonny Ruiz HEWITT Surgeon's Group or Practice Name:  [facility] Phone number:  [phone] ATTN: MEGAN DAVIS Fax number:  [phone]   Type of Clearance Requested:   - Medical ; ASA    Type of Anesthesia:  Not Indicated   Additional requests/questions:    Elpidio Anis   05/04/2023, 9:34 AM

## 2023-05-04 NOTE — Telephone Encounter (Signed)
Pt has been scheduled a tele pre op appt 06/09/23. Med rec and consent are done. Pt states surgery to be early Jan 2025.     Patient Consent for Virtual Visit        Melissa Jordan has provided verbal consent on 05/04/2023 for a virtual visit (video or telephone).   CONSENT FOR VIRTUAL VISIT FOR:  Melissa Jordan  By participating in this virtual visit I agree to the following:  I hereby voluntarily request, consent and authorize [facility] and its employed or contracted physicians, physician assistants, nurse practitioners or other licensed health care professionals (the Practitioner), to provide me with telemedicine health care services (the "Services") as deemed necessary by the treating Practitioner. I acknowledge and consent to receive the Services by the Practitioner via telemedicine. I understand that the telemedicine visit will involve communicating with the Practitioner through live audiovisual communication technology and the disclosure of certain medical information by electronic transmission. I acknowledge that I have been given the opportunity to request an in-person assessment or other available alternative prior to the telemedicine visit and am voluntarily participating in the telemedicine visit.  I understand that I have the right to withhold or withdraw my consent to the use of telemedicine in the course of my care at any time, without affecting my right to future care or treatment, and that the Practitioner or I may terminate the telemedicine visit at any time. I understand that I have the right to inspect all information obtained and/or recorded in the course of the telemedicine visit and may receive copies of available information for a reasonable fee.  I understand that some of the potential risks of receiving the Services via telemedicine include:  Delay or interruption in medical evaluation due to technological equipment failure or disruption; Information  transmitted may not be sufficient (e.g. poor resolution of images) to allow for appropriate medical decision making by the Practitioner; and/or  In rare instances, security protocols could fail, causing a breach of personal health information.  Furthermore, I acknowledge that it is my responsibility to provide information about my medical history, conditions and care that is complete and accurate to the best of my ability. I acknowledge that Practitioner's advice, recommendations, and/or decision may be based on factors not within their control, such as incomplete or inaccurate data provided by me or distortions of diagnostic images or specimens that may result from electronic transmissions. I understand that the practice of medicine is not an exact science and that Practitioner makes no warranties or guarantees regarding treatment outcomes. I acknowledge that a copy of this consent can be made available to me via my patient portal [facility] MyChart), or I can request a printed copy by calling the office of [facility].    I understand that my insurance will be billed for this visit.   I have read or had this consent read to me. I understand the contents of this consent, which adequately explains the benefits and risks of the Services being provided via telemedicine.  I have been provided ample opportunity to ask questions regarding this consent and the Services and have had my questions answered to my satisfaction. I give my informed consent for the services to be provided through the use of telemedicine in my medical care

## 2023-05-12 ENCOUNTER — Ambulatory Visit: Payer: Medicare Other | Admitting: *Deleted

## 2023-05-12 DIAGNOSIS — L501 Idiopathic urticaria: Secondary | ICD-10-CM

## 2023-05-13 ENCOUNTER — Encounter: Payer: Self-pay | Admitting: Family Medicine

## 2023-05-18 ENCOUNTER — Telehealth: Payer: Self-pay | Admitting: Cardiology

## 2023-05-18 DIAGNOSIS — T8484XA Pain due to internal orthopedic prosthetic devices, implants and grafts, initial encounter: Secondary | ICD-10-CM | POA: Diagnosis not present

## 2023-05-18 NOTE — Telephone Encounter (Signed)
Se previous clearance encounter. Patient is following up. She would like to know if 12/17 telehealth appointment can be moved up. Please advise.

## 2023-05-20 NOTE — Telephone Encounter (Signed)
Advised the patient the that we do not have any sooner appointments. The patient states she does not want to have to put her procedure off again. I asked the patient if she had a procedure date. She stated she does not. I advised the patient to contact her provider doing the procedure and make them aware that she has an appointment scheduled on 06/09/23 that way she can at least get a date for her procedure. I advised her that once her visit is complete and if she is cleared a copy of the providers note will be sent to the requesting provider. She voiced understanding and states she will contact there provider to make them aware and get the procedure scheduled.   Patient thanked me for my call.

## 2023-06-01 ENCOUNTER — Other Ambulatory Visit: Payer: Self-pay

## 2023-06-01 ENCOUNTER — Ambulatory Visit: Payer: Medicare Other | Admitting: Surgical

## 2023-06-01 ENCOUNTER — Other Ambulatory Visit (INDEPENDENT_AMBULATORY_CARE_PROVIDER_SITE_OTHER): Payer: Medicare Other

## 2023-06-01 DIAGNOSIS — M25551 Pain in right hip: Secondary | ICD-10-CM

## 2023-06-01 DIAGNOSIS — M7061 Trochanteric bursitis, right hip: Secondary | ICD-10-CM

## 2023-06-07 ENCOUNTER — Encounter: Payer: Self-pay | Admitting: Surgical

## 2023-06-07 MED ORDER — METHYLPREDNISOLONE ACETATE 40 MG/ML IJ SUSP
40.0000 mg | INTRAMUSCULAR | Status: AC | PRN
Start: 2023-06-01 — End: 2023-06-01
  Administered 2023-06-01: 40 mg via INTRA_ARTICULAR

## 2023-06-07 MED ORDER — BUPIVACAINE HCL 0.25 % IJ SOLN
4.0000 mL | INTRAMUSCULAR | Status: AC | PRN
Start: 2023-06-01 — End: 2023-06-01
  Administered 2023-06-01: 4 mL via INTRA_ARTICULAR

## 2023-06-07 MED ORDER — LIDOCAINE HCL 1 % IJ SOLN
5.0000 mL | INTRAMUSCULAR | Status: AC | PRN
Start: 2023-06-01 — End: 2023-06-01
  Administered 2023-06-01: 5 mL
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
## 2023-06-09 ENCOUNTER — Ambulatory Visit: Payer: Medicare Other | Attending: Cardiology | Admitting: Nurse Practitioner

## 2023-06-09 ENCOUNTER — Other Ambulatory Visit (HOSPITAL_COMMUNITY): Payer: Self-pay | Admitting: Orthopedic Surgery

## 2023-06-09 ENCOUNTER — Ambulatory Visit: Payer: Medicare Other | Admitting: *Deleted

## 2023-06-09 DIAGNOSIS — Z01810 Encounter for preprocedural cardiovascular examination: Secondary | ICD-10-CM

## 2023-06-09 DIAGNOSIS — L501 Idiopathic urticaria: Secondary | ICD-10-CM | POA: Diagnosis not present
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
## 2023-06-10 ENCOUNTER — Encounter (HOSPITAL_BASED_OUTPATIENT_CLINIC_OR_DEPARTMENT_OTHER): Payer: Self-pay | Admitting: Orthopedic Surgery

## 2023-06-10 ENCOUNTER — Other Ambulatory Visit: Payer: Self-pay

## 2023-06-10 NOTE — Progress Notes (Signed)
Anesthesia eval: Reviewed for [facility]. I don't see anything that would preclude her from safely having surgery at [facility]. Afib is well controlled. She has had multiple surgeries recently and has done well. No concerning cardiac symptoms. Okay to proceed at [facility].

## 2023-06-10 NOTE — Progress Notes (Signed)
Chart reviewed with Dr Renold Don, patient OK for [facility] [address].

## 2023-06-12 ENCOUNTER — Other Ambulatory Visit: Payer: Self-pay | Admitting: Cardiology

## 2023-06-23 ENCOUNTER — Other Ambulatory Visit: Payer: Self-pay | Admitting: Family Medicine

## 2023-06-23 DIAGNOSIS — M8580 Other specified disorders of bone density and structure, unspecified site: Secondary | ICD-10-CM

## 2023-06-25 ENCOUNTER — Encounter (HOSPITAL_BASED_OUTPATIENT_CLINIC_OR_DEPARTMENT_OTHER)
Admission: RE | Disposition: A | Discharge status: Home / Self Care | Payer: Self-pay | Source: Home / Self Care | Attending: Orthopedic Surgery

## 2023-06-25 ENCOUNTER — Ambulatory Visit (HOSPITAL_BASED_OUTPATIENT_CLINIC_OR_DEPARTMENT_OTHER): Payer: Medicare Other | Admitting: Anesthesiology

## 2023-06-25 ENCOUNTER — Encounter (HOSPITAL_BASED_OUTPATIENT_CLINIC_OR_DEPARTMENT_OTHER): Payer: Self-pay | Admitting: Orthopedic Surgery

## 2023-06-25 ENCOUNTER — Ambulatory Visit (HOSPITAL_BASED_OUTPATIENT_CLINIC_OR_DEPARTMENT_OTHER)
Admission: RE | Admit: 2023-06-25 | Discharge: 2023-06-25 | Disposition: A | Discharge status: Home / Self Care | Payer: Medicare Other | Attending: Orthopedic Surgery | Admitting: Orthopedic Surgery

## 2023-06-25 ENCOUNTER — Ambulatory Visit (HOSPITAL_BASED_OUTPATIENT_CLINIC_OR_DEPARTMENT_OTHER): Payer: Medicare Other

## 2023-06-25 ENCOUNTER — Other Ambulatory Visit: Payer: Self-pay

## 2023-06-25 DIAGNOSIS — Z472 Encounter for removal of internal fixation device: Secondary | ICD-10-CM | POA: Diagnosis not present

## 2023-06-25 DIAGNOSIS — M1990 Unspecified osteoarthritis, unspecified site: Secondary | ICD-10-CM | POA: Insufficient documentation

## 2023-06-25 DIAGNOSIS — E785 Hyperlipidemia, unspecified: Secondary | ICD-10-CM

## 2023-06-25 DIAGNOSIS — T8484XA Pain due to internal orthopedic prosthetic devices, implants and grafts, initial encounter: Secondary | ICD-10-CM | POA: Insufficient documentation

## 2023-06-25 DIAGNOSIS — I10 Essential (primary) hypertension: Secondary | ICD-10-CM | POA: Insufficient documentation

## 2023-06-25 DIAGNOSIS — E039 Hypothyroidism, unspecified: Secondary | ICD-10-CM | POA: Diagnosis not present

## 2023-06-25 DIAGNOSIS — G8929 Other chronic pain: Secondary | ICD-10-CM | POA: Insufficient documentation

## 2023-06-25 DIAGNOSIS — I480 Paroxysmal atrial fibrillation: Secondary | ICD-10-CM | POA: Diagnosis not present

## 2023-06-25 DIAGNOSIS — Z7982 Long term (current) use of aspirin: Secondary | ICD-10-CM | POA: Insufficient documentation

## 2023-06-25 DIAGNOSIS — Z96653 Presence of artificial knee joint, bilateral: Secondary | ICD-10-CM | POA: Diagnosis not present

## 2023-06-25 DIAGNOSIS — Z79899 Other long term (current) drug therapy: Secondary | ICD-10-CM | POA: Insufficient documentation

## 2023-06-25 DIAGNOSIS — Y831 Surgical operation with implant of artificial internal device as the cause of abnormal reaction of the patient, or of later complication, without mention of misadventure at the time of the procedure: Secondary | ICD-10-CM | POA: Insufficient documentation

## 2023-06-25 DIAGNOSIS — M19072 Primary osteoarthritis, left ankle and foot: Secondary | ICD-10-CM | POA: Diagnosis not present

## 2023-06-25 DIAGNOSIS — Z79890 Hormone replacement therapy (postmenopausal): Secondary | ICD-10-CM | POA: Insufficient documentation

## 2023-06-25 DIAGNOSIS — K219 Gastro-esophageal reflux disease without esophagitis: Secondary | ICD-10-CM | POA: Insufficient documentation

## 2023-06-25 DIAGNOSIS — M960 Pseudarthrosis after fusion or arthrodesis: Secondary | ICD-10-CM | POA: Diagnosis not present

## 2023-06-25 DIAGNOSIS — M8580 Other specified disorders of bone density and structure, unspecified site: Secondary | ICD-10-CM | POA: Insufficient documentation

## 2023-06-25 DIAGNOSIS — Z01818 Encounter for other preprocedural examination: Secondary | ICD-10-CM

## 2023-06-25 DIAGNOSIS — T8489XA Other specified complication of internal orthopedic prosthetic devices, implants and grafts, initial encounter: Secondary | ICD-10-CM | POA: Diagnosis not present

## 2023-06-25 DIAGNOSIS — G8918 Other acute postprocedural pain: Secondary | ICD-10-CM | POA: Diagnosis not present

## 2023-06-25 HISTORY — PX: FOOT ARTHRODESIS: SHX1655

## 2023-06-25 HISTORY — PX: HARDWARE REMOVAL: SHX979

## 2023-06-25 SURGERY — REMOVAL, HARDWARE
Anesthesia: Regional | Site: Foot | Laterality: Left

## 2023-06-25 MED ORDER — MIDAZOLAM HCL 2 MG/2ML IJ SOLN: 2.0000 mg | Freq: Once | INTRAMUSCULAR | Status: DC

## 2023-06-25 MED ORDER — FENTANYL CITRATE (PF) 100 MCG/2ML IJ SOLN
INTRAMUSCULAR | Status: AC
  Filled 2023-06-25: qty 2

## 2023-06-25 MED ORDER — DEXAMETHASONE SODIUM PHOSPHATE 10 MG/ML IJ SOLN
INTRAMUSCULAR | Status: DC | PRN
  Administered 2023-06-25: 4 mg via INTRAVENOUS

## 2023-06-25 MED ORDER — VANCOMYCIN HCL 500 MG IV SOLR
INTRAVENOUS | Status: AC
  Filled 2023-06-25: qty 10

## 2023-06-25 MED ORDER — DOCUSATE SODIUM 100 MG PO CAPS: 100.0000 mg | ORAL_CAPSULE | Freq: Two times a day (BID) | ORAL | 0 refills | Status: DC

## 2023-06-25 MED ORDER — OXYCODONE HCL 5 MG PO TABS: 5.0000 mg | ORAL_TABLET | Freq: Four times a day (QID) | ORAL | 0 refills | Status: AC | PRN

## 2023-06-25 MED ORDER — MIDAZOLAM HCL 2 MG/2ML IJ SOLN
INTRAMUSCULAR | Status: AC
  Filled 2023-06-25: qty 2

## 2023-06-25 MED ORDER — VANCOMYCIN HCL 500 MG IV SOLR
INTRAVENOUS | Status: DC | PRN
  Administered 2023-06-25: 500 mg via TOPICAL

## 2023-06-25 MED ORDER — 0.9 % SODIUM CHLORIDE (POUR BTL) OPTIME
TOPICAL | Status: DC | PRN
  Administered 2023-06-25: 1000 mL

## 2023-06-25 MED ORDER — BUPIVACAINE-EPINEPHRINE (PF) 0.5% -1:200000 IJ SOLN
INTRAMUSCULAR | Status: AC
  Filled 2023-06-25: qty 30

## 2023-06-25 MED ORDER — PROPOFOL 10 MG/ML IV BOLUS
INTRAVENOUS | Status: DC | PRN
  Administered 2023-06-25: 20 mg via INTRAVENOUS
  Administered 2023-06-25: 150 mg via INTRAVENOUS

## 2023-06-25 MED ORDER — SENNA 8.6 MG PO TABS: 2.0000 | ORAL_TABLET | Freq: Two times a day (BID) | ORAL | 0 refills | Status: DC

## 2023-06-25 MED ORDER — SODIUM CHLORIDE 0.9 % IV SOLN: INTRAVENOUS | Status: DC | PRN

## 2023-06-25 MED ORDER — CEFAZOLIN SODIUM-DEXTROSE 2-4 GM/100ML-% IV SOLN
INTRAVENOUS | Status: AC
  Filled 2023-06-25: qty 100

## 2023-06-25 MED ORDER — LACTATED RINGERS IV SOLN: INTRAVENOUS | Status: DC

## 2023-06-25 MED ORDER — ACETAMINOPHEN 500 MG PO TABS
ORAL_TABLET | ORAL | Status: AC
  Filled 2023-06-25: qty 2

## 2023-06-25 MED ORDER — RIVAROXABAN 10 MG PO TABS: 10.0000 mg | ORAL_TABLET | Freq: Every day | ORAL | 0 refills | Status: DC

## 2023-06-25 MED ORDER — ACETAMINOPHEN 500 MG PO TABS
1000.0000 mg | ORAL_TABLET | Freq: Once | ORAL | Status: AC
Start: 2023-06-25 — End: 2023-06-25
  Administered 2023-06-25: 1000 mg via ORAL

## 2023-06-25 MED ORDER — FENTANYL CITRATE (PF) 100 MCG/2ML IJ SOLN: 25.0000 ug | INTRAMUSCULAR | Status: DC | PRN

## 2023-06-25 MED ORDER — ONDANSETRON HCL 4 MG/2ML IJ SOLN
INTRAMUSCULAR | Status: DC | PRN
  Administered 2023-06-25: 4 mg via INTRAVENOUS

## 2023-06-25 MED ORDER — FENTANYL CITRATE (PF) 100 MCG/2ML IJ SOLN
100.0000 ug | Freq: Once | INTRAMUSCULAR | Status: AC
  Administered 2023-06-25: 100 ug via INTRAVENOUS

## 2023-06-25 MED ORDER — PROPOFOL 500 MG/50ML IV EMUL
INTRAVENOUS | Status: DC | PRN
  Administered 2023-06-25: 150 ug/kg/min via INTRAVENOUS

## 2023-06-25 MED ORDER — CEFAZOLIN SODIUM-DEXTROSE 2-4 GM/100ML-% IV SOLN
2.0000 g | INTRAVENOUS | Status: AC
  Administered 2023-06-25: 2 g via INTRAVENOUS

## 2023-06-25 SURGICAL SUPPLY — 79 items
AUGMENT INJECTABLE KIT 3CC (Tissue) IMPLANT
BANDAGE ESMARK 6X9 LF (GAUZE/BANDAGES/DRESSINGS) IMPLANT
BENZOIN TINCTURE PRP APPL 2/3 (GAUZE/BANDAGES/DRESSINGS) IMPLANT
BIT DRILL 4.8X200 CANN (BIT) IMPLANT
BIT DRILL CAL 2.5 ST W/SLV (BIT) IMPLANT
BIT DRILL CANN 3.5MM (DRILL) IMPLANT
BIT TREPHINE CORING 8 (BIT) IMPLANT
BLADE AVERAGE 25X9 (BLADE) IMPLANT
BLADE MICRO SAGITTAL (BLADE) IMPLANT
BLADE SURG 15 STRL LF DISP TIS (BLADE) ×2 IMPLANT
BNDG ELASTIC 4INX 5YD STR LF (GAUZE/BANDAGES/DRESSINGS) ×1 IMPLANT
BNDG ELASTIC 6INX 5YD STR LF (GAUZE/BANDAGES/DRESSINGS) ×1 IMPLANT
BNDG ESMARK 4X9 LF (GAUZE/BANDAGES/DRESSINGS) IMPLANT
BNDG ESMARK 6X9 LF (GAUZE/BANDAGES/DRESSINGS)
BONE CHIP PRESERV 5CC PCAN5 (Bone Implant) ×1 IMPLANT
CHLORAPREP W/TINT 26 (MISCELLANEOUS) ×1 IMPLANT
COVER BACK TABLE 60X90IN (DRAPES) ×1 IMPLANT
CUFF TRNQT CYL 34X4.125X (TOURNIQUET CUFF) IMPLANT
DRAPE EXTREMITY T 121X128X90 (DISPOSABLE) ×1 IMPLANT
DRAPE OEC MINIVIEW 54X84 (DRAPES) ×1 IMPLANT
DRAPE SURG 17X23 STRL (DRAPES) IMPLANT
DRAPE U-SHAPE 47X51 STRL (DRAPES) ×1 IMPLANT
DRILL CANN 3.5MM (DRILL) ×1
DRSG MEPITEL 4X7.2 (GAUZE/BANDAGES/DRESSINGS) ×1 IMPLANT
ELECT REM PT RETURN 9FT ADLT (ELECTROSURGICAL) ×1
ELECTRODE REM PT RTRN 9FT ADLT (ELECTROSURGICAL) ×1 IMPLANT
GAUZE PAD ABD 8X10 STRL (GAUZE/BANDAGES/DRESSINGS) ×2 IMPLANT
GAUZE SPONGE 4X4 12PLY STRL (GAUZE/BANDAGES/DRESSINGS) ×1 IMPLANT
GLOVE BIO SURGEON STRL SZ8 (GLOVE) ×1 IMPLANT
GLOVE BIOGEL PI IND STRL 8 (GLOVE) ×2 IMPLANT
GLOVE ECLIPSE 8.0 STRL XLNG CF (GLOVE) ×1 IMPLANT
GOWN STRL REUS W/ TWL LRG LVL3 (GOWN DISPOSABLE) ×1 IMPLANT
GOWN STRL REUS W/ TWL XL LVL3 (GOWN DISPOSABLE) ×2 IMPLANT
GRAFT BNE CANC CHIPS 1-8 5CC (Bone Implant) IMPLANT
GUIDEWIRE TYB 2X9 (WIRE) IMPLANT
K-WIRE FX200X1.8XTROC TIP (WIRE) ×1
KIT STRATUM INSTRUMENT STD (KITS) IMPLANT
KWIRE FX200X1.8XTROC TIP (WIRE) IMPLANT
LOOP VASCLR MAXI BLUE 18IN ST (MISCELLANEOUS) IMPLANT
LOOP VESSEL MINI RED (MISCELLANEOUS) IMPLANT
NDL HYPO 22X1.5 SAFETY MO (MISCELLANEOUS) ×1 IMPLANT
NDL SAFETY ECLIPSE 18X1.5 (NEEDLE) IMPLANT
NEEDLE HYPO 22X1.5 SAFETY MO (MISCELLANEOUS)
PACK BASIN DAY SURGERY FS (CUSTOM PROCEDURE TRAY) ×1 IMPLANT
PAD CAST 4YDX4 CTTN HI CHSV (CAST SUPPLIES) ×1 IMPLANT
PADDING CAST COTTON 6X4 STRL (CAST SUPPLIES) ×1 IMPLANT
PENCIL SMOKE EVACUATOR (MISCELLANEOUS) ×1 IMPLANT
PIN GUIDE DRILL TIP 2.8X300 (DRILL) IMPLANT
PLATE TN STRM SM LT (Plate) IMPLANT
SANITIZER HAND PURELL FF 515ML (MISCELLANEOUS) ×1 IMPLANT
SCREW 6.5X55MM FT (Screw) IMPLANT
SCREW CANN 8.0X70 16 THRD (Screw) IMPLANT
SCREW CANNULATED 5.0X32MM (Screw) IMPLANT
SCREW CANNULATED 8.0X70MM (Screw) ×1 IMPLANT
SCREW LOCK ST STRM 3.5X26 (Screw) IMPLANT
SCREW LP STRM NL 3.5X26 ST (Screw) IMPLANT
SCREW STRM NL LP 3.5X20 (Screw) IMPLANT
SHEET MEDIUM DRAPE 40X70 STRL (DRAPES) ×1 IMPLANT
SLEEVE SCD COMPRESS KNEE MED (STOCKING) ×1 IMPLANT
SPIKE FLUID TRANSFER (MISCELLANEOUS) IMPLANT
SPLINT PLASTER CAST FAST 5X30 (CAST SUPPLIES) ×20 IMPLANT
SPONGE SURGIFOAM ABS GEL 12-7 (HEMOSTASIS) IMPLANT
SPONGE T-LAP 18X18 ~~LOC~~+RFID (SPONGE) ×1 IMPLANT
STOCKINETTE 6 STRL (DRAPES) ×1 IMPLANT
STRIP CLOSURE SKIN 1/2X4 (GAUZE/BANDAGES/DRESSINGS) IMPLANT
SUCTION TUBE FRAZIER 10FR DISP (SUCTIONS) ×1 IMPLANT
SUT ETHILON 3 0 PS 1 (SUTURE) ×1 IMPLANT
SUT MNCRL AB 3-0 PS2 18 (SUTURE) ×1 IMPLANT
SUT VIC AB 2-0 SH 18 (SUTURE) IMPLANT
SUT VIC AB 2-0 SH 27XBRD (SUTURE) ×1 IMPLANT
SUT VICRYL 0 SH 27 (SUTURE) IMPLANT
SYR BULB EAR ULCER 3OZ GRN STR (SYRINGE) ×1 IMPLANT
SYR CONTROL 10ML LL (SYRINGE) IMPLANT
TIE VASCULAR MAXI BLUE 18IN ST (MISCELLANEOUS)
TOWEL GREEN STERILE FF (TOWEL DISPOSABLE) ×2 IMPLANT
TUBE CONNECTING 20X1/4 (TUBING) ×1 IMPLANT
UNDERPAD 30X36 HEAVY ABSORB (UNDERPADS AND DIAPERS) ×1 IMPLANT
VASCULAR TIE MAXI BLUE 18IN ST (MISCELLANEOUS)
YANKAUER SUCT BULB TIP NO VENT (SUCTIONS) IMPLANT

## 2023-06-25 NOTE — Op Note (Signed)
 06/25/2023  12:19 PM  PATIENT:  Melissa Jordan  78 y.o. female  PRE-OPERATIVE DIAGNOSIS:  1.  Left talonavicular and subtalar joint nonunion 2.  Painful hardware left talus 3.  Painful hardware left calcaneus  POST-OPERATIVE DIAGNOSIS:  same  Procedure(s):  Removal of deep implant left talus Removal of deep implant left calcaneus (separate incision) Revision arthrodesis left talonavicular joint (separate incision) Revision arthrodesis left subtalar joint (separate incision) 5.  Left foot AP, lateral and Harris heel xrays 6.  Left ankle AP and lateral xrays   SURGEON:  Norleen Armor, MD  ASSISTANT: Eva Barrack, PA-C  ANESTHESIA:   General, regional  EBL:  minimal   TOURNIQUET:   Total Tourniquet Time Documented: Thigh (Left) - 82 minutes Total: Thigh (Left) - 82 minutes  COMPLICATIONS:  None apparent  DISPOSITION:  Extubated, awake and stable to recovery.  INDICATION FOR PROCEDURE:  78 y/o female c/o continued pain at the left hindfoot after talonavicular and subtalar joint arthrodeses in the remote past.  She has painful hardware at the calcaneus and talus and nonunions of the talonavicular and subtalar joints.  She has failed non op treatment and presents today for surgery.  The risks and benefits of the alternative treatment options have been discussed in detail.  The patient wishes to proceed with surgery and specifically understands risks of bleeding, infection, nerve damage, blood clots, need for additional surgery, amputation and death.   PROCEDURE IN DETAIL:  After pre operative consent was obtained, and the correct operative site was identified, the patient was brought to the operating room and placed supine on the OR table.  Anesthesia was administered.  Pre-operative antibiotics were administered.  A surgical timeout was taken.  The left lower extremity was prepped and draped in standard sterile fashion with a tourniquet around the thigh.  The extremity was elevated,  and the tourniquet was inflated to 250 mmHg.  A longitudinal incision was made at the previous surgical scar over the dorsum of the talonavicular joint.  Dissection was carried down through the subcutaneous tissues.  The interval between the EHL and tibialis anterior was developed.  The dorsal aspect of the talonavicular joint was identified.  Subperiosteal dissection was carried medially and laterally.  Laterally there were 2 broken fragments of staple identified.  Both were removed after freeing them from the surrounding scar tissue.  Medially and intact staple was identified.  It was freed from the bone with an osteotome and removed in its entirety.  Attention was turned to the posterior heel.  The previous surgical incision was opened sharply.  A guidepin was inserted through the subcutaneous tissues into the screw head.  The cannulated screwdriver was used to remove the screw without difficulty.  Attention was turned to the lateral hindfoot where an incision was made at the subtalar joint.  Dissection was carried down through the subcutaneous tissues.  The subtalar joint was entered.  A lamina spreader was used to open the joint.  The remaining fibrous tissue was removed.  The joint surfaces were debrided with a curette, periosteal elevator and rondure removing all subchondral bone.  The remaining bone was perforated with a small drill bit leaving the resultant bone graft in place.  Attention was returned to the talonavicular joint.  The scar tissue was taken down from the joint.  The joint surfaces were prepared by removing all of the subchondral bone and then perforating the remaining bone with a drill bit.  Both joints were then packed with Stryker  augment and fine cancellous chips.  The talonavicular joint was reduced.  A guidepin was inserted across the medial aspect of the talonavicular joint.  Radiographs confirmed appropriate position of the pin.  It was overdrilled.  A 5 mm partially-threaded  Zimmer [employer] cannulated screw was inserted.  It was noted to have excellent purchase and compressed the medial portion of the joint appropriately.  A guidepin was then inserted lateral to the previous screw track from the calcaneus tuberosity into the lateral dome of the talus.  AP and lateral ankle films showed appropriate position of the guidepin.  Harris heel confirmed appropriate trajectory through the calcaneus.  The guidepin was overdrilled.  An 8 mm partially-threaded Zimmer [employer]omet cannulated screw was inserted.  It was noted to have excellent purchase and compressed the subtalar joint appropriately.  Attention was then returned to the talonavicular incision.  A guidepin was inserted from the neck of the talus down into the anterior process of the calcaneus.  Lateral and Harris heel views showed appropriate position of the guidepin.  It was overdrilled.  A fully threaded 6.5 millimeter screw was inserted across the anterior talonavicular joint.  A stratum small talonavicular plate template was placed across the lateral half of the talonavicular joint.  It was provisionally pinned.  Radiographs confirmed appropriate position of the template.  The template was then removed.  A plate was impacted into position proximally and then further secured with 2 nonlocking bicortical screws.  The compression slot was then drilled and a compression pin inserted.  The knot was inserted.  The knot was tightened securely compressing the lateral half of the talonavicular joint.  The plate was then secured with 2 bicortical locking screws and 1 nonlocking screw after removing the compression pin.  Final AP, mortise and lateral radiographs showed appropriate arthrodesis of the talonavicular and subtalar joints in appropriate position and length of all hardware.  The wounds were irrigated copiously and sprinkled with vancomycin  powder.  Subcutaneous tissues were approximated with Vicryl and Monocryl.  Skin incisions were  closed with nylon.  Sterile dressings were applied followed by a well-padded short leg splint.  The tourniquet was released after application of the dressings.  The patient was awakened from anesthesia and transported to the recovery room in stable condition.   FOLLOW UP PLAN:  NWB L LE in a short leg splint.  F/u in two weeks for suture removal and NWB SLC.  Xarelto  for DVT prophylaxis.   RADIOGRAPHS:  AP and lateral x-rays of the left ankle show interval arthrodesis of the subtalar joint, removal of hardware from the calcaneus and new hardware across the talonavicular joint.  Hardware is appropriately positioned and of the appropriate lengths.  No other acute injuries are noted.  AP, lateral and Harris heel radiographs of the left foot show interval arthrodesis of the subtalar and talonavicular joints.  Interval hardware removal is noted with a fragment of broken staple in the head of the talus.  Hardware across both joints is appropriately positioned and of the appropriate lengths.  No other acute injuries are noted.    Justin Ollis PA-C was present and scrubbed for the duration of the operative case. His assistance was essential in positioning the patient, prepping and draping, gaining and maintaining exposure, performing the operation, closing and dressing the wounds and applying the splint.

## 2023-06-25 NOTE — Telephone Encounter (Signed)
 Requested Prescriptions   Pending Prescriptions Disp Refills   rOPINIRole  (REQUIP ) 3 MG tablet [Pharmacy Med Name: ROPINIROLE   3MG   TAB] 100 tablet 2    Sig: TAKE 1 TABLET BY MOUTH AT  BEDTIME     Date of patient request: 06/25/2023 Last office visit: 02/20/2023 Upcoming visit: Visit date not found Date of last refill: 08/25/2022 Last refill amount: 100

## 2023-06-25 NOTE — Anesthesia Preprocedure Evaluation (Signed)
 Anesthesia Evaluation  Patient identified by MRN, date of birth, ID band Patient awake    Reviewed: Allergy & Precautions, NPO status , Patient's Chart, lab work & pertinent test results, reviewed documented beta blocker date and time   History of Anesthesia Complications (+) PONV and history of anesthetic complications  Airway Mallampati: I  TM Distance: >3 FB Neck ROM: Full    Dental no notable dental hx.    Pulmonary neg pulmonary ROS   Pulmonary exam normal breath sounds clear to auscultation       Cardiovascular hypertension, Pt. on home beta blockers and Pt. on medications Normal cardiovascular exam+ dysrhythmias Atrial Fibrillation  Rhythm:Regular Rate:Normal     Neuro/Psych negative neurological ROS  negative psych ROS   GI/Hepatic Neg liver ROS,GERD  ,,  Endo/Other  Hypothyroidism    Renal/GU negative Renal ROS  negative genitourinary   Musculoskeletal  (+) Arthritis ,  S/p spinal cord stimulator currently off   Abdominal   Peds  Hematology negative hematology ROS (+)   Anesthesia Other Findings   Reproductive/Obstetrics                             Anesthesia Physical Anesthesia Plan  ASA: 3  Anesthesia Plan: General and Regional   Post-op Pain Management: Regional block* and Tylenol  PO (pre-op)*   Induction: Intravenous  PONV Risk Score and Plan: 4 or greater and Ondansetron , Dexamethasone , Midazolam  and TIVA  Airway Management Planned: LMA  Additional Equipment:   Intra-op Plan:   Post-operative Plan: Extubation in OR  Informed Consent: I have reviewed the patients History and Physical, chart, labs and discussed the procedure including the risks, benefits and alternatives for the proposed anesthesia with the patient or authorized representative who has indicated his/her understanding and acceptance.     Dental advisory given  Plan Discussed with:  CRNA  Anesthesia Plan Comments:        Anesthesia Quick Evaluation
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
## 2023-06-25 NOTE — Discharge Instructions (Addendum)
 Norleen Armor, MD [facility]  Please read the following information regarding your care after surgery.  Medications  You only need a prescription for the narcotic pain medicine (ex. oxycodone , Percocet, Norco).  All of the other medicines listed below are available over the counter. ? Aleve  2 pills twice a day for the first 3 days after surgery. ? acetominophen (Tylenol ) 650 mg every 4-6 hours as you need for minor to moderate pain ? oxycodone  as prescribed for severe pain  Narcotic pain medicine (ex. oxycodone , Percocet, Vicodin) will cause constipation.  To prevent this problem, take the following medicines while you are taking any pain medicine. ? docusate sodium  (Colace) 100 mg twice a day ? senna (Senokot) 2 tablets twice a day  ? To help prevent blood clots, take Xarelto  as prescribed for two weeks after surgery.  You should also get up every hour while you are awake to move around.    Weight Bearing ? Do not bear any weight on the operated leg or foot.  Cast / Splint / Dressing ? Keep your splint, cast or dressing clean and dry.  Don't put anything (coat hanger, pencil, etc) down inside of it.  If it gets damp, use a hair dryer on the cool setting to dry it.  If it gets soaked, call the office to schedule an appointment for a cast change.     After your dressing, cast or splint is removed; you may shower, but do not soak or scrub the wound.  Allow the water  to run over it, and then gently pat it dry.  Swelling It is normal for you to have swelling where you had surgery.  To reduce swelling and pain, keep your toes above your nose for at least 3 days after surgery.  It may be necessary to keep your foot or leg elevated for several weeks.  If it hurts, it should be elevated.  Follow Up Call my office at [phone] when you are discharged from the hospital or surgery center to schedule an appointment to be seen two weeks after surgery.  Call my office at [phone] if you  develop a fever >101.5 F, nausea, vomiting, bleeding from the surgical site or severe pain.    No Tylenol  until 3:30pm

## 2023-06-25 NOTE — Progress Notes (Signed)
Assisted Dr. Woodrum with left, adductor canal, popliteal, ultrasound guided block. Side rails up, monitors on throughout procedure. See vital signs in flow sheet. Tolerated Procedure well. 

## 2023-06-25 NOTE — Anesthesia Procedure Notes (Signed)
 Procedure Name: LMA Insertion Date/Time: 06/25/2023 10:31 AM  Performed by: Julieanne Fairy BROCKS, CRNAPre-anesthesia Checklist: Patient identified, Emergency Drugs available, Suction available and Patient being monitored Patient Re-evaluated:Patient Re-evaluated prior to induction Oxygen Delivery Method: Circle system utilized Preoxygenation: Pre-oxygenation with 100% oxygen Induction Type: IV induction Ventilation: Mask ventilation without difficulty LMA: LMA inserted LMA Size: 4.0 Number of attempts: 1 Airway Equipment and Method: Bite block Placement Confirmation: positive ETCO2 Tube secured with: Tape Dental Injury: Teeth and Oropharynx as per pre-operative assessment

## 2023-06-25 NOTE — Transfer of Care (Signed)
 Immediate Anesthesia Transfer of Care Note  Patient: Melissa Jordan  Procedure(s) Performed: HARDWARE REMOVAL OF TALONAVICULAR AND SUBTALAR JOINT (Left: Foot) REVISION OF LEFT SUBTALAR AND TALONAVICULAR JOINT ARTHRODESES (Left: Foot)  Patient Location: PACU  Anesthesia Type:GA combined with regional for post-op pain  Level of Consciousness: sedated  Airway & Oxygen Therapy: Patient Spontanous Breathing and Patient connected to face mask oxygen  Post-op Assessment: Report given to RN and Post -op Vital signs reviewed and stable  Post vital signs: Reviewed and stable  Last Vitals:  Vitals Value Taken Time  BP 126/81 06/25/23 1216  Temp    Pulse 72 06/25/23 1220  Resp 15 06/25/23 1220  SpO2 99 % 06/25/23 1220  Vitals shown include unfiled device data.  Last Pain:  Vitals:   06/25/23 0915  TempSrc: Oral  PainSc: 6       Patients Stated Pain Goal: 7 (06/25/23 0915)  Complications: No notable events documented.

## 2023-06-26 ENCOUNTER — Encounter (HOSPITAL_BASED_OUTPATIENT_CLINIC_OR_DEPARTMENT_OTHER): Payer: Self-pay | Admitting: Orthopedic Surgery

## 2023-06-26 MED ORDER — DEXAMETHASONE SODIUM PHOSPHATE 10 MG/ML IJ SOLN
INTRAMUSCULAR | Status: DC | PRN
  Administered 2023-06-25: 5 mg

## 2023-06-26 MED ORDER — ROPIVACAINE HCL 5 MG/ML IJ SOLN
INTRAMUSCULAR | Status: DC | PRN
  Administered 2023-06-25: 20 mL via PERINEURAL

## 2023-06-26 MED ORDER — BUPIVACAINE LIPOSOME 1.3 % IJ SUSP
INTRAMUSCULAR | Status: DC | PRN
  Administered 2023-06-25: 10 mL via PERINEURAL

## 2023-06-26 MED ORDER — BUPIVACAINE HCL (PF) 0.5 % IJ SOLN
INTRAMUSCULAR | Status: DC | PRN
  Administered 2023-06-25: 20 mL

## 2023-06-26 NOTE — Anesthesia Postprocedure Evaluation (Signed)
 Anesthesia Post Note  Patient: Melissa Jordan  Procedure(s) Performed: HARDWARE REMOVAL OF TALONAVICULAR AND SUBTALAR JOINT (Left: Foot) REVISION OF LEFT SUBTALAR AND TALONAVICULAR JOINT ARTHRODESES (Left: Foot)     Patient location during evaluation: PACU Anesthesia Type: Regional and General Level of consciousness: awake and alert Pain management: pain level controlled Vital Signs Assessment: post-procedure vital signs reviewed and stable Respiratory status: spontaneous breathing, nonlabored ventilation, respiratory function stable and patient connected to nasal cannula oxygen Cardiovascular status: blood pressure returned to baseline and stable Postop Assessment: no apparent nausea or vomiting Anesthetic complications: no  No notable events documented.  Last Vitals:  Vitals:   06/25/23 1300 06/25/23 1314  BP: 128/73 136/72  Pulse: 70   Resp: 19   Temp:  36.4 C  SpO2: 99%     Last Pain:  Vitals:   06/26/23 0847  TempSrc:   PainSc: 0-No pain   Pain Goal: Patients Stated Pain Goal: 3 (06/25/23 1314)                 Kathyjo Briere L Laila Myhre

## 2023-06-26 NOTE — Anesthesia Procedure Notes (Addendum)
 Anesthesia Regional Block: Popliteal block   Pre-Anesthetic Checklist: , timeout performed,  Correct Patient, Correct Site, Correct Laterality,  Correct Procedure, Correct Position, site marked,  Risks and benefits discussed,  Pre-op evaluation,  At surgeon's request and post-op pain management  Laterality: Left  Prep: Maximum Sterile Barrier Precautions used, chloraprep       Needles:  Injection technique: Single-shot  Needle Type: Echogenic Stimulator Needle     Needle Length: 9cm  Needle Gauge: 21     Additional Needles:   Procedures:,,,, ultrasound used (permanent image in chart),,    Narrative:  Start time: 06/25/2023 9:43 AM End time: 06/25/2023 9:47 AM Injection made incrementally with aspirations every 5 mL. Anesthesiologist: Niels Marien CROME, MD

## 2023-06-26 NOTE — Anesthesia Procedure Notes (Signed)
 Anesthesia Regional Block: Adductor canal block   Pre-Anesthetic Checklist: , timeout performed,  Correct Patient, Correct Site, Correct Laterality,  Correct Procedure, Correct Position, site marked,  Risks and benefits discussed,  Pre-op evaluation,  At surgeon's request and post-op pain management  Laterality: Left  Prep: Maximum Sterile Barrier Precautions used, chloraprep       Needles:  Injection technique: Single-shot  Needle Type: Echogenic Stimulator Needle     Needle Length: 9cm  Needle Gauge: 21     Additional Needles:   Procedures:,,,, ultrasound used (permanent image in chart),,    Narrative:  Start time: 06/25/2023 9:47 AM End time: 06/25/2023 9:50 AM Injection made incrementally with aspirations every 5 mL. Anesthesiologist: Niels Marien CROME, MD

## 2023-06-30 ENCOUNTER — Other Ambulatory Visit: Payer: Self-pay | Admitting: Cardiology

## 2023-07-07 ENCOUNTER — Ambulatory Visit: Payer: Medicare Other

## 2023-07-08 ENCOUNTER — Ambulatory Visit: Payer: Medicare Other

## 2023-07-08 DIAGNOSIS — L501 Idiopathic urticaria: Secondary | ICD-10-CM

## 2023-07-08 DIAGNOSIS — T8484XD Pain due to internal orthopedic prosthetic devices, implants and grafts, subsequent encounter: Secondary | ICD-10-CM | POA: Diagnosis not present

## 2023-07-22 DIAGNOSIS — T8484XD Pain due to internal orthopedic prosthetic devices, implants and grafts, subsequent encounter: Secondary | ICD-10-CM | POA: Diagnosis not present

## 2023-07-27 ENCOUNTER — Other Ambulatory Visit: Payer: Self-pay | Admitting: Cardiology

## 2023-07-29 ENCOUNTER — Encounter (HOSPITAL_BASED_OUTPATIENT_CLINIC_OR_DEPARTMENT_OTHER): Payer: Self-pay | Admitting: Orthopedic Surgery

## 2023-07-31 ENCOUNTER — Encounter: Payer: Self-pay | Admitting: Cardiology

## 2023-08-03 ENCOUNTER — Ambulatory Visit: Payer: Medicare Other | Admitting: Family Medicine

## 2023-08-03 ENCOUNTER — Telehealth: Payer: Self-pay

## 2023-08-03 MED ORDER — METOPROLOL SUCCINATE ER 25 MG PO TB24: 25.0000 mg | ORAL_TABLET | Freq: Every day | ORAL | 0 refills | Status: DC

## 2023-08-03 NOTE — Telephone Encounter (Signed)
 Copied from CRM [id number]. Topic: General - Call Back - No Documentation >> Aug 03, 2023 11:03 AM Allyne Areola wrote: Reason for CRM: Patient received a call from the clinic; however, no documentation. Please call patient back at earliest convenience.

## 2023-08-04 ENCOUNTER — Telehealth: Payer: Self-pay | Admitting: Family Medicine

## 2023-08-04 NOTE — Telephone Encounter (Signed)
FYI

## 2023-08-04 NOTE — Telephone Encounter (Signed)
[facility] [place] Night - C Client Site [facility] - Night Provider Lezlie Octave- MD Contact Type Call Who Is Calling Patient / Member / Family / Caregiver Caller Name Soua Lenk Caller Phone Number [phone] Patient Name Melissa Jordan Patient DOB Nov 16, 1945 Call Type Message Only Information Provided Reason for Call Request to [facility] Appointment Initial Comment Caller states she is calling to cancel appointment tomorrow at 1045am. She had surgery on her foot. Patient request to speak to RN No Disp. Time Disposition Final User 08/02/2023 9:31:45 PM General Information Provided Yes Lonia Blood Call Closed By: Lonia Blood Transaction Date/Time: 08/02/2023 9:28:43 PM (ET)

## 2023-08-05 ENCOUNTER — Ambulatory Visit: Payer: Medicare Other

## 2023-08-05 DIAGNOSIS — L501 Idiopathic urticaria: Secondary | ICD-10-CM | POA: Diagnosis not present

## 2023-08-07 DIAGNOSIS — T8484XA Pain due to internal orthopedic prosthetic devices, implants and grafts, initial encounter: Secondary | ICD-10-CM | POA: Diagnosis not present

## 2023-08-18 ENCOUNTER — Other Ambulatory Visit: Payer: Self-pay | Admitting: Cardiology

## 2023-09-02 ENCOUNTER — Ambulatory Visit: Payer: Medicare Other | Admitting: *Deleted

## 2023-09-02 DIAGNOSIS — T8484XA Pain due to internal orthopedic prosthetic devices, implants and grafts, initial encounter: Secondary | ICD-10-CM | POA: Diagnosis not present

## 2023-09-02 DIAGNOSIS — L501 Idiopathic urticaria: Secondary | ICD-10-CM | POA: Diagnosis not present

## 2023-09-06 NOTE — Progress Notes (Unsigned)
 Cardiology Office Note:  .   Date:  09/06/2023  ID:  Melissa Jordan, DOB [birth year], MRN [id number] PCP: Sheliah Hatch, MD  [facility] Providers Cardiologist:  Little Ishikawa, MD Electrophysiologist:  Lanier Prude, MD {  History of Present Illness: .   Melissa Jordan is a 78 y.o. female w/PMHx of  HTN, hypothyroidism, chronic back pain AFib  Saw Dr. Lalla Brothers 07/07/22, was s/p watchman implant 03/20/22 > CT scan on May 26, 2022 which showed complete device endothelialization with an average of 17% compression.  Reported intermittent feeling of breathlessness, uncertain if this was associated with rhythm or not Bruising with plavix Lopressor > Toprol Plavix until 6 mo  Saw Dr. Bjorn Pippin 02/19/23, denied symptoms of AFib, reported LLE swelling since her watchman procedure  Planned for venous doppler >>> was negative for DVT  Had foot surgery Jan 2025 > xarelto for DVT prophylaxis  Today's visit is scheduled as an annual visit  ROS:   She is finally on the mend with her foot and very happy Last night she had episode of Afib > realized that she had been busy and not drank any water/anything all day.  The last time she had Afib over a year ago also had gotten dehydrated. Her Afib is fast > makes her weak, lightheaded She laid down and it settled quickly  No CP, SOB, DOE No near syncope or syncope   Arrhythmia/AAD hx AFib found 2020 Amiodarone  > headaches > stopped Traumatic thigh hematoma LAAO/Watchman implant 03/20/22  Studies Reviewed: Marland Kitchen    EKG done today and reviewed by myself SR 69, PAC  05/26/22: Cardiac CT IMPRESSION: 1. There is a 27 mm Watchman FLX device with successful exclusion. 2. There is complete device endothelialization. 3. There is average of 17% compression. 4. There is a persistent left to right interatrial shunt.  03/20/22: TEE 1. Interventional TEE for LAA-O Procedure.   2. Prior to procedure, patent left atrial  appendage.   3. Maximal diameter 2.3 cm with depth of 2.3 cm.   4. Mid inferior transeptal puncture.   5. Multiple recaptures to optimize positioning.   6. Placement of a 27 mm Watchman FLX device. No peri-device leak. There  is color flow adjacent to the device that is pulmonary vein flow  (confirmed by spectral Doppler) Small mitral shoulder. 20 % compression.   7. Small left to right shunt.   8. Trivial pericardial effusion unchanged throughout procedure.   9. Left ventricular ejection fraction, by estimation, is 55 to 60%. The  left ventricle has normal function.  10. Right ventricular systolic function is normal. The right ventricular  size is normal.  11. Left atrial size was moderately dilated. No left atrial/left atrial  appendage thrombus was detected.  12. Right atrial size was mildly dilated.  13. The mitral valve is grossly normal. Mild to moderate mitral valve  regurgitation. No evidence of mitral stenosis.  14. The aortic valve is tricuspid. There is mild thickening of the aortic  valve. Aortic valve regurgitation is not visualized.  15. There is mild (Grade II) plaque involving the descending aorta.    03/20/22: LAAO CONCLUSIONS:  1.Successful implantation of a WATCHMAN left atrial appendage occlusive device    2. TEE demonstrating no LAA thrombus 3. No early apparent complications.    Risk Assessment/Calculations:    Physical Exam:   VS:  There were no vitals taken for this visit.   Wt Readings from Last 3 Encounters:  06/25/23 152 lb 12.5 oz (69.3 kg)  03/23/23 152 lb 12.8 oz (69.3 kg)  02/26/23 150 lb (68 kg)    GEN: Well nourished, well developed in no acute distress NECK: No JVD; No carotid bruits CARDIAC: RRR, no murmurs, rubs, gallops RESPIRATORY:  CTA b/l without rales, wheezing or rhonchi  ABDOMEN: Soft, non-tender, non-distended EXTREMITIES: No edema; No deformity   ASSESSMENT AND PLAN: .    paroxysmal AFib CHA2DS2Vasc is 4 off OAC s/p LAAO  device placement low burden by symptoms  OK to stop ASA (Coronary Ca score was one)  HTN No changes today      Dispo: will have her back in Sept/year post watchman mark, sooner if needed  Signed, Sheilah Pigeon, PA-C

## 2023-09-08 ENCOUNTER — Ambulatory Visit: Payer: Medicare Other | Attending: Physician Assistant | Admitting: Physician Assistant

## 2023-09-08 VITALS — BP 114/64 | HR 69 | Ht 63.0 in | Wt 153.0 lb

## 2023-09-08 DIAGNOSIS — I480 Paroxysmal atrial fibrillation: Secondary | ICD-10-CM

## 2023-09-08 DIAGNOSIS — I10 Essential (primary) hypertension: Secondary | ICD-10-CM | POA: Diagnosis not present

## 2023-09-08 NOTE — Patient Instructions (Signed)
 Medication Instructions:   Your physician recommends that you continue on your current medications as directed. Please refer to the Current Medication list given to you today.  *If you need a refill on your cardiac medications before your next appointment, please call your pharmacy*   Lab Work: NONE ORDERED  TODAY   If you have labs (blood work) drawn today and your tests are completely normal, you will receive your results only by: MyChart Message (if you have MyChart) OR A paper copy in the mail If you have any lab test that is abnormal or we need to change your treatment, we will call you to review the results.   Testing/Procedures: NONE ORDERED  TODAY    Follow-Up: At [facility], you and your health needs are our priority.  As part of our continuing mission to provide you with exceptional heart care, we have created designated Provider Care Teams.  These Care Teams include your primary Cardiologist (physician) and Advanced Practice Providers (APPs -  Physician Assistants and Nurse Practitioners) who all work together to provide you with the care you need, when you need it.  We recommend signing up for the patient portal called "MyChart".  Sign up information is provided on this After Visit Summary.  MyChart is used to connect with patients for Virtual Visits (Telemedicine).  Patients are able to view lab/test results, encounter notes, upcoming appointments, etc.  Non-urgent messages can be sent to your provider as well.   To learn more about what you can do with MyChart, go to [email or web address].    Your next appointment:   6 month(s)  Provider:   Steffanie Dunn, MD or Francis Dowse, PA-C    Other Instructions

## 2023-09-09 ENCOUNTER — Ambulatory Visit (HOSPITAL_COMMUNITY)
Admission: RE | Admit: 2023-09-09 | Discharge: 2023-09-09 | Disposition: A | Discharge status: Home / Self Care | Source: Ambulatory Visit | Attending: Cardiovascular Disease | Admitting: Cardiovascular Disease

## 2023-09-09 ENCOUNTER — Other Ambulatory Visit (HOSPITAL_COMMUNITY): Payer: Self-pay | Admitting: Student

## 2023-09-09 DIAGNOSIS — M79605 Pain in left leg: Secondary | ICD-10-CM | POA: Diagnosis not present

## 2023-09-15 DIAGNOSIS — E039 Hypothyroidism, unspecified: Secondary | ICD-10-CM | POA: Diagnosis not present

## 2023-09-21 ENCOUNTER — Ambulatory Visit: Payer: Medicare Other | Admitting: Internal Medicine

## 2023-09-21 ENCOUNTER — Encounter: Payer: Self-pay | Admitting: Internal Medicine

## 2023-09-21 ENCOUNTER — Other Ambulatory Visit: Payer: Self-pay | Admitting: Cardiology

## 2023-09-21 ENCOUNTER — Other Ambulatory Visit: Payer: Self-pay

## 2023-09-21 VITALS — BP 110/68 | HR 64 | Temp 98.2°F | Resp 16 | Ht 63.0 in | Wt 159.7 lb

## 2023-09-21 DIAGNOSIS — Z91018 Allergy to other foods: Secondary | ICD-10-CM

## 2023-09-21 DIAGNOSIS — L501 Idiopathic urticaria: Secondary | ICD-10-CM | POA: Diagnosis not present

## 2023-09-21 DIAGNOSIS — R0981 Nasal congestion: Secondary | ICD-10-CM | POA: Diagnosis not present

## 2023-09-21 MED ORDER — AZELASTINE HCL 0.1 % NA SOLN: 1.0000 | Freq: Two times a day (BID) | NASAL | 5 refills | Status: AC | PRN

## 2023-09-21 MED ORDER — EPINEPHRINE 0.3 MG/0.3ML IJ SOAJ: 0.3000 mg | INTRAMUSCULAR | 1 refills | Status: AC | PRN

## 2023-09-21 NOTE — Progress Notes (Signed)
 FOLLOW UP Date of Service/Encounter:  09/21/23  Subjective:  Melissa Jordan (DOB: 10-23-1945) is a 78 y.o. female who [facility] in re-evaluation of the following: Chronic urticaria, alpha gal allergy History obtained from: chart review and patient.  For Review, LV was on 03/13/23  with Dr.Derya Dettmann seen for routine follow-up. See below for summary of history and diagnostics.   Therapeutic plans/changes recommended: Doing well on Xolair with occasional hives which she relates to mammalian byproducts.  Discussed increasing dose of Xolair for food allergies, but she wished to stay on current dosing for hives. ----------------------------------------------------- Pertinent History/Diagnostics:  Urticaria:  Prior to January 2024, had some blistery hives which were similar but not exactly the same as current rash.  Treated for viral illness and given prednisone. Resolved after stopping plavix. February/March 2024, she started getting little groups of hives. They would stay in same spot for a few days. No bruising. Occurring daily. Had Covid around time hives first appeared. Evaluated by Dermatology 10/13/22:  Plan: urticaria-clobetasol, AH PRN, finish course prednisone, FU AI; purpura: benign, OTC arnica and observe 2 ED visits -4/12, 4/13 and -04/14 "allergic dermatitis"; tx-depomedrol, prednisone, benadryl, atarax 10/18/22: CMP with elevated creatinine and BUN, low total protein, CBCd - elevated WBC 22 with left shift ANC 19.1 - Xolair first dose 11/27/22 - 10/22/22: normal tryptase 3.2, CU index 5.6, normal thyroid levels, positive alpha-gal--recommended diet free of mammalian meat 12/17/22: Singluar discontinued. Not effective Venom Allergy:  -completed VIT years ago, reaction 20 years ago (hives, swelling, presyncope, etc). --------------------------------------------------- Today presents for follow-up. Discussed the use of AI scribe software  for clinical note transcription with the patient, who gave verbal consent to proceed.  History of Present Illness   Melissa Jordan is a 78 year old female with alpha-gal allergy who presents for follow-up on Xolair treatment and hives management.  She has not experienced any breakthrough hives since her last visit and continues to avoid mammalian meats, which she believes may have previously triggered hives. She is currently on Xolair injections and tolerates them well, noting no adverse reactions.  She recalls a history of severe allergic reactions to insect stings, specifically white-faced hornets, wasps, and yellow jackets, which occurred approximately 20 years ago. During that incident, she experienced itching of her hands and feet, swelling, and tachycardia, which required hospital treatment with epinephrine. She underwent venom immunotherapy in the past and has since been cautious to avoid exposure.  She has undergone two surgeries on her foot, the most recent being in January 2025. The first surgery involved staples and a screw, which led to complications when the staples broke, causing prolonged inflammation and swelling. The second surgery involved the placement of multiple screws, and she is currently undergoing physical therapy to improve balance. Her foot and leg still swell, but she is hopeful for a full recovery.  She is currently using azelastine nasal spray as needed for sneezing episodes and has an EpiPen available for emergencies due to previous history of venom allergy and alpha gal allergy.       Chart Review: 09/02/23-received Xolair injection 300 mg coming every 4 weeks  All medications reviewed by clinical staff and updated in chart. No new pertinent medical or surgical history except as noted in HPI.  ROS: All others negative except as noted per HPI.   Objective:  BP 110/68 (BP Location: Left Arm, Patient Position: Sitting, Cuff Size: Normal)   Pulse 64   Temp 98.2  F (36.8 C) (Temporal)   Resp 16   Ht 5\' 3"  (1.6 m)   Wt 159 lb 11.2 oz (72.4 kg)   SpO2 98%   BMI 28.29 kg/m  Body mass index is 28.29 kg/m. Physical Exam: General Appearance:  Alert, cooperative, no distress, appears stated age  Head:  Normocephalic, without obvious abnormality, atraumatic  Eyes:  Conjunctiva clear, EOM's intact  Ears EACs normal bilaterally and normal TMs bilaterally  Nose: Nares normal, hypertrophic turbinates, normal mucosa, and no visible anterior polyps  Throat: Lips, tongue normal; teeth and gums normal, normal posterior oropharynx  Neck: Supple, symmetrical  Lungs:   clear to auscultation bilaterally, Respirations unlabored, no coughing  Heart:  regular rate and rhythm and no murmur, Appears well perfused  Extremities: No edema  Skin: Skin color, texture, turgor normal and no rashes or lesions on visualized portions of skin  Neurologic: No gross deficits   Labs:  Lab Orders  No laboratory test(s) ordered today   Assessment/Plan   Chronic Urticaria: Therapy Plan:  -Continue daily Claritin as needed. Can increase up to 4 tablets during a flare. -Use Hydroxyzine at night if needed for flare, mindful of potential drowsiness and fall risk. - continue Xolair (omalizumab) 300 mg q4 weeks.  Can use one of the following in place of zyrtec if desires: Claritin (loratadine) 10 mg, Xyzal (levocetirizine) 5 mg or Allegra (fexofenadine) 180 mg daily as needed  Alpha gal allergy:  - continue to avoid mammalian meat  - for symptoms concerning for respiratory distress or abdominal symptoms with hives, please use your epipen and let us know.  Chronic rhinitis;  - Claritin as above - can use azelastine nasal spray - 1-2 sprays up to twice daily as needed.   Follow up : 6 months, sooner if needed It was a pleasure seeing you again in clinic today! Thank you for allowing me to participate in your care.  We did discuss considering spacing out Xolair with the goal  of discontinuing since hives controlled.  However, she also has alpha gal, and food allergies are another treatment use of Xolair.  At this time she would like to continue Xolair injections, and if we do decide to space out due to hives, would obtain labs for alpha gal allergy to see if it looks like she is outgrowing this or not prior to making a decision regarding Xolair discontinuation. For now, will continue current dosing.  Other: none  Tonny Bollman, MD  [facility]

## 2023-09-21 NOTE — Patient Instructions (Addendum)
 Chronic Urticaria: Therapy Plan:  -Continue daily Claritin as needed. Can increase up to 4 tablets during a flare. -Use Hydroxyzine at night if needed for flare, mindful of potential drowsiness and fall risk. - continue Xolair (omalizumab) 300 mg q4 weeks.  Can use one of the following in place of zyrtec if desires: Claritin (loratadine) 10 mg, Xyzal (levocetirizine) 5 mg or Allegra (fexofenadine) 180 mg daily as needed  Alpha gal allergy:  - continue to avoid mammalian meat  - for symptoms concerning for respiratory distress or abdominal symptoms with hives, please use your epipen and let us know.  Chronic rhinitis;  - Claritin as above - can use azelastine nasal spray - 1-2 sprays up to twice daily as needed.   Follow up : 6 months, sooner if needed It was a pleasure seeing you again in clinic today! Thank you for allowing me to participate in your care.  Tonny Bollman, MD [facility]

## 2023-09-22 DIAGNOSIS — M8580 Other specified disorders of bone density and structure, unspecified site: Secondary | ICD-10-CM | POA: Diagnosis not present

## 2023-09-22 DIAGNOSIS — I482 Chronic atrial fibrillation, unspecified: Secondary | ICD-10-CM | POA: Diagnosis not present

## 2023-09-22 DIAGNOSIS — E039 Hypothyroidism, unspecified: Secondary | ICD-10-CM | POA: Diagnosis not present

## 2023-09-22 DIAGNOSIS — M79672 Pain in left foot: Secondary | ICD-10-CM | POA: Diagnosis not present

## 2023-09-22 DIAGNOSIS — I10 Essential (primary) hypertension: Secondary | ICD-10-CM | POA: Diagnosis not present

## 2023-09-25 DIAGNOSIS — M79672 Pain in left foot: Secondary | ICD-10-CM | POA: Diagnosis not present

## 2023-09-28 DIAGNOSIS — M79672 Pain in left foot: Secondary | ICD-10-CM | POA: Diagnosis not present

## 2023-09-30 ENCOUNTER — Ambulatory Visit

## 2023-09-30 DIAGNOSIS — L501 Idiopathic urticaria: Secondary | ICD-10-CM | POA: Diagnosis not present

## 2023-09-30 DIAGNOSIS — M79672 Pain in left foot: Secondary | ICD-10-CM | POA: Diagnosis not present

## 2023-10-05 DIAGNOSIS — M79672 Pain in left foot: Secondary | ICD-10-CM | POA: Diagnosis not present

## 2023-10-08 DIAGNOSIS — M79672 Pain in left foot: Secondary | ICD-10-CM | POA: Diagnosis not present

## 2023-10-08 DIAGNOSIS — T8484XD Pain due to internal orthopedic prosthetic devices, implants and grafts, subsequent encounter: Secondary | ICD-10-CM | POA: Diagnosis not present

## 2023-10-12 DIAGNOSIS — Z9689 Presence of other specified functional implants: Secondary | ICD-10-CM | POA: Diagnosis not present

## 2023-10-12 DIAGNOSIS — G894 Chronic pain syndrome: Secondary | ICD-10-CM | POA: Diagnosis not present

## 2023-10-12 DIAGNOSIS — M79672 Pain in left foot: Secondary | ICD-10-CM | POA: Diagnosis not present

## 2023-10-21 DIAGNOSIS — M79672 Pain in left foot: Secondary | ICD-10-CM | POA: Diagnosis not present

## 2023-10-28 ENCOUNTER — Ambulatory Visit

## 2023-10-28 DIAGNOSIS — L501 Idiopathic urticaria: Secondary | ICD-10-CM | POA: Diagnosis not present

## 2023-10-29 ENCOUNTER — Ambulatory Visit (INDEPENDENT_AMBULATORY_CARE_PROVIDER_SITE_OTHER): Admitting: Family Medicine

## 2023-10-29 ENCOUNTER — Telehealth: Payer: Self-pay

## 2023-10-29 ENCOUNTER — Encounter: Payer: Self-pay | Admitting: Family Medicine

## 2023-10-29 VITALS — BP 118/70 | HR 63 | Temp 98.6°F | Ht 63.0 in | Wt 159.0 lb

## 2023-10-29 DIAGNOSIS — G252 Other specified forms of tremor: Secondary | ICD-10-CM

## 2023-10-29 MED ORDER — METOPROLOL TARTRATE 25 MG PO TABS: 25.0000 mg | ORAL_TABLET | Freq: Two times a day (BID) | ORAL | 3 refills | Status: DC

## 2023-10-29 NOTE — Telephone Encounter (Signed)
**Note De-identified  [place] Obfuscation** Please advise 

## 2023-10-29 NOTE — Progress Notes (Signed)
   Subjective:    Patient ID: Melissa Jordan, female    DOB: 28-Feb-1946, 78 y.o.   MRN: [id number]  HPI Tremors- only L hand.  Unable to carry anything w/o trembling.  Can extend arm and hold it still w/o difficulty but if there is any weight in her hand, will have significant tremor.  Not able to carry a cup of coffee.  Pt is L handed.  Already on Metoprolol  XL 25mg    Review of Systems For ROS see HPI     Objective:   Physical Exam Vitals reviewed.  Constitutional:      General: She is not in acute distress.    Appearance: Normal appearance. She is not ill-appearing.  HENT:     Head: Normocephalic and atraumatic.  Skin:    General: Skin is warm and dry.  Neurological:     Mental Status: She is alert and oriented to person, place, and time.     Cranial Nerves: No cranial nerve deficit.     Motor: No weakness.     Coordination: Coordination abnormal (tremor when holding purse but able to extend hands and keep steady).           Assessment & Plan:  Intention tremor- deteriorated.  Pt reports she is having a difficult time doing anything that requires holding/carrying anything w/ her L hand (her dominant hand).  She is on Metoprolol  XL and feels sxs worsened when she transitioned from short acting to long acting.  Will revert back to BID dosing and refer to Neuro.  Pt expressed understanding and is in agreement w/ plan.

## 2023-10-29 NOTE — Telephone Encounter (Signed)
 Pt has been notified.

## 2023-10-29 NOTE — Patient Instructions (Signed)
 Follow up as needed or as scheduled CHANGE the Metoprolol  back to twice daily (short acting) We'll call you to schedule your Neurology appt Call with any questions or concerns Stay Safe!  Stay Healthy! HAPPY MOTHER'S DAY!!!

## 2023-10-29 NOTE — Assessment & Plan Note (Signed)
New

## 2023-10-29 NOTE — Telephone Encounter (Signed)
 I am happy to change her referral to [facility]

## 2023-10-29 NOTE — Telephone Encounter (Signed)
 Copied from CRM [id number]. Topic: Referral - Question >> Oct 29, 2023 10:38 AM Adonis Hoot wrote: Reason for CRM: Patient would like to know if her referral could be switched to [facility] neurology instead of [facility].She looked up the reviews and they weren't good.

## 2023-11-04 ENCOUNTER — Encounter: Payer: Self-pay | Admitting: Neurology

## 2023-11-12 ENCOUNTER — Other Ambulatory Visit: Payer: Self-pay | Admitting: *Deleted

## 2023-11-12 MED ORDER — OMALIZUMAB 150 MG/ML ~~LOC~~ SOSY: 300.0000 mg | PREFILLED_SYRINGE | SUBCUTANEOUS | 11 refills | Status: AC

## 2023-11-13 DIAGNOSIS — M79672 Pain in left foot: Secondary | ICD-10-CM | POA: Diagnosis not present

## 2023-11-18 DIAGNOSIS — T8484XA Pain due to internal orthopedic prosthetic devices, implants and grafts, initial encounter: Secondary | ICD-10-CM | POA: Diagnosis not present

## 2023-11-18 NOTE — Progress Notes (Signed)
 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  However, it potentially is dystonic, as it is only when the left arm is flexed and holding something.  However, I was not able to initiate it when she was writing or doing other activities, which is more common with dystonic tremor.  She is already on metoprolol  and she does think that this helps tremor somewhat.  However, she does not think it helps enough.  We ultimately decided to try low-dose primidone, 50 mg daily. R/b/se discussed.  Subjective:   Melissa Jordan was seen today in the movement disorders clinic for neurologic consultation at the request of Jess Morita, MD.  The consultation is for the evaluation of a left hand tremor when holding a weight.  Patient is left-hand dominant.  Medical records made available to me are reviewed.  Patient noted that symptoms got worse when she was placed on metoprolol  XL, and Dr. Paulla Bossier switched her back to short acting metoprolol , 25 mg twice per day.  Pt does think that this has helped  Tremor: Yes.     How long has it been going on? L hand x "several years"  At rest or with activation?  activation  When is it noted the most?  Carrying something in the L arm; holding a hair dryer.  Doesn't happen with eating/drinking; not a rest  Fam hx of tremor?  Yes.   Maternal GM with Parkinsons Disease   Located where?  L hand only and she is L handed  Affected by caffeine:  doesn't drink much d/t hx of a-fib  Affected by alcohol:  doesn't drink any  Affected by stress:  No.  Affected by fatigue:  No.  Spills soup if on spoon:  No.  Spills glass of liquid if full:  No.  Affects ADL's (tying shoes, brushing teeth, etc):  No.  Tremor inducing meds:  No.  Other Specific Symptoms:  Voice: no change Sleep:   Vivid Dreams:  No.  Acting out dreams:  some  yelling Wet Pillows: No. Postural symptoms:  Yes.   - had foot surgery in 2024 and "it failed" so wearing a boot and had another surgery 06/2023.  A week ago fascia broke underneath the arch so balance not good because of it Bradykinesia symptoms: no bradykinesia noted Loss of smell:  No. Loss of taste:  No. Urinary Incontinence:  has urgency Difficulty Swallowing:  No. Handwriting, micrographia: No. (Its messier) Trouble with ADL's:  No.  Trouble buttoning clothing: No. Depression:  No. Memory changes:  No. N/V:  No. Lightheaded:  No.  Syncope: No. Diplopia:  No.   Neuroimaging of the brain has not previously been performed.    ALLERGIES:   Allergies  Allergen Reactions   Lyrica [Pregabalin] Palpitations and Other (See Comments)    "thought I was going to have a heart attack; heart started pounding so hard, I couldn't catch my breath" (06/28/2012)   Meclizine Other (See Comments)    "what was in my mind to say wasn't what came out to say; I was kind of in another world" (06/28/2012)   Penicillins Itching and Rash    Has patient had a PCN reaction causing immediate rash, facial/tongue/throat swelling, SOB or lightheadedness with hypotension: No Has patient had a PCN reaction causing severe rash involving mucus membranes or skin  necrosis: No Has patient had a PCN reaction that required hospitalization No Has patient had a PCN reaction occurring within the last 10 years:   # # YES # #  If all of the above answers are "NO", then may proceed with Cephalosporin use.  Has tolerated cefazolin  (2020, 2023)    Poison Sumac Extract Swelling, Rash and Other (See Comments)    Blisters   Alpha-Gal    Propoxyphene Nausea And Vomiting   Bactrim  [Sulfamethoxazole -Trimethoprim ] Rash   Codeine  Nausea Only    Reaction only in pill form   Morphine  Sulfate Rash   Propoxyphene N-Acetaminophen  Nausea And Vomiting    "Darvocet"   Vancomycin  Rash    CURRENT MEDICATIONS:  Current Outpatient  Medications  Medication Instructions   acetaminophen  (TYLENOL ) 500-1,000 mg, Every 6 hours PRN   azelastine  (ASTELIN ) 0.1 % nasal spray 1 spray, Each Nare, 2 times daily PRN, Use in each nostril as directed   Calcium  Carb-Cholecalciferol  (CALCIUM  600 + D PO) 1 tablet, Daily at bedtime   EPINEPHrine  (EPIPEN  2-PAK) 0.3 mg, Intramuscular, As needed   estradiol  (ESTRACE ) 1 mg, Oral, Daily   levothyroxine  (SYNTHROID ) 25 mcg, Oral, Daily   magnesium  oxide (MAG-OX) 400 mg, Daily at bedtime   metoprolol  tartrate (LOPRESSOR ) 25 mg, Oral, 2 times daily   omalizumab  (XOLAIR ) 300 mg, Subcutaneous, Every 28 days   Polyethyl Glycol-Propyl Glycol (SYSTANE) 0.4-0.3 % SOLN 1 drop, Daily   rOPINIRole  (REQUIP ) 3 mg, Oral, Daily at bedtime   traMADol  (ULTRAM ) 50 mg, Every 6 hours PRN   VITAMIN D  PO 5,000 Units, Every morning    Objective:   PHYSICAL EXAMINATION:    VITALS:   Vitals:   11/23/23 1024  BP: 118/74  Pulse: 61  SpO2: 98%  Weight: 145 lb 8 oz (66 kg)  Height: 5\' 3"  (1.6 m)    GEN:  The patient appears stated age and is in NAD. HEENT:  Normocephalic, atraumatic.  The mucous membranes are moist. The superficial temporal arteries are without ropiness or tenderness. CV:  RRR Lungs:  CTAB Neck/HEME:  There are no carotid bruits bilaterally.  Neurological examination:  Orientation: The patient is alert and oriented x3.  Cranial nerves: There is good facial symmetry.  Extraocular muscles are intact. The visual fields are full to confrontational testing. The speech is fluent and clear. Soft palate rises symmetrically and there is no tongue deviation. Hearing is intact to conversational tone. Sensation: Sensation is intact to light touch throughout (facial, trunk, extremities). Vibration is intact at the right ankle and left knee (boot on left foot). There is no extinction with double simultaneous stimulation.  Motor: Strength is 5/5 in the bilateral upper and lower extremities.   Shoulder  shrug is equal and symmetric.  There is no pronator drift. Deep tendon reflexes: Deep tendon reflexes are 2/4 at the bilateral biceps, triceps, brachioradialis, bilateral patella.  Plantar responses are downgoing bilaterally.  Movement examination: Tone: There is nl tone in the bilateral upper extremities.  The tone in the lower extremities is nl.  Abnormal movements: there is mild head tremor in the "yes" direction.  Archimedes spirals are drawn very well.  She is able to write a sentence without any evidence of dystonic posturing.  No tremor with the outstretched hands.  No intention tremor.  When given a weight in the left arm is in the wing beating position, she has very little tremor of the left hand/thumb. Archimedes spirals:  Handwriting sample:  Coordination:  There is no  decremation with RAM's, with any form of RAMS, including alternating supination and pronation of the forearm, hand opening and closing, finger taps, heel taps and toe taps.  Gait and Station: The patient pushes off to arise. The patient's stride length is good (she is antalgic because of walking with a boot).   I have reviewed and interpreted the following labs independently   Chemistry      Component Value Date/Time   NA 142 02/20/2023 1009   NA 144 05/09/2022 1109   K 4.3 02/20/2023 1009   CL 103 02/20/2023 1009   CO2 33 (H) 02/20/2023 1009   BUN 17 02/20/2023 1009   BUN 20 05/09/2022 1109   CREATININE 0.91 02/20/2023 1009      Component Value Date/Time   CALCIUM  9.7 02/20/2023 1009   ALKPHOS 90 02/20/2023 1009   AST 13 02/20/2023 1009   ALT 9 02/20/2023 1009   BILITOT 0.6 02/20/2023 1009      Lab Results  Component Value Date   TSH 2.38 02/20/2023   Lab Results  Component Value Date   WBC 7.4 02/20/2023   HGB 13.4 02/20/2023   HCT 41.9 02/20/2023   MCV 89.5 02/20/2023   PLT 215.0 02/20/2023      Total time spent on today's visit was 45 minutes, including both face-to-face time and  nonface-to-face time.  Time included that spent on review of records (prior notes available to me/labs/imaging if pertinent), discussing treatment and goals, answering patient's questions and coordinating care.  Cc:  Tabori, Katherine E, MD

## 2023-11-23 ENCOUNTER — Encounter: Payer: Self-pay | Admitting: Neurology

## 2023-11-23 ENCOUNTER — Ambulatory Visit: Admitting: Neurology

## 2023-11-23 VITALS — BP 118/74 | HR 61 | Ht 63.0 in | Wt 145.5 lb

## 2023-11-23 DIAGNOSIS — R251 Tremor, unspecified: Secondary | ICD-10-CM | POA: Diagnosis not present

## 2023-11-23 MED ORDER — PRIMIDONE 50 MG PO TABS: 50.0000 mg | ORAL_TABLET | Freq: Every day | ORAL | 1 refills | Status: DC

## 2023-11-23 NOTE — Patient Instructions (Addendum)
 Start primidone 50 mg - 1/2 tablet at bedtime for 1 week and then increase to 1 tablet at bedtime thereafter.  If you find that this doesn't last in the daytime, you can move the bedtime dosage to daytime.  The physicians and staff at [facility] are committed to providing excellent care. You may receive a survey requesting feedback about your experience at our office. We strive to receive "very good" responses to the survey questions. If you feel that your experience would prevent you from giving the office a "very good " response, please contact our office to try to remedy the situation. We may be reached at [phone]. Thank you for taking the time out of your busy day to complete the survey.

## 2023-11-25 ENCOUNTER — Ambulatory Visit

## 2023-11-25 DIAGNOSIS — L501 Idiopathic urticaria: Secondary | ICD-10-CM

## 2023-12-18 DIAGNOSIS — T8484XA Pain due to internal orthopedic prosthetic devices, implants and grafts, initial encounter: Secondary | ICD-10-CM | POA: Diagnosis not present

## 2023-12-21 ENCOUNTER — Other Ambulatory Visit: Payer: Self-pay | Admitting: Family Medicine

## 2023-12-21 ENCOUNTER — Ambulatory Visit: Admitting: Internal Medicine

## 2023-12-21 DIAGNOSIS — Z1231 Encounter for screening mammogram for malignant neoplasm of breast: Secondary | ICD-10-CM

## 2023-12-24 ENCOUNTER — Telehealth: Admitting: Family Medicine

## 2023-12-24 ENCOUNTER — Ambulatory Visit: Admitting: Family Medicine

## 2023-12-24 DIAGNOSIS — L309 Dermatitis, unspecified: Secondary | ICD-10-CM

## 2023-12-24 MED ORDER — PREDNISONE 10 MG (21) PO TBPK: ORAL_TABLET | ORAL | 0 refills | Status: DC

## 2023-12-24 NOTE — Progress Notes (Signed)
 E Visit for Rash  We are sorry that you are not feeling well. Here is how we plan to help!  Based on what you shared with me it looks like you have contact dermatitis.  Contact dermatitis is a skin rash caused by something that touches the skin and causes irritation or inflammation.  Your skin may be red, swollen, dry, cracked, and itch.  The rash should go away in a few days but can last a few weeks.  If you get a rash, it's important to figure out what caused it so the irritant can be avoided in the future.  Since it is not fully responding to topical steroid cream, I have added on a steroid pack to take as directed to help this resolve.   HOME CARE:  Take cool showers and avoid direct sunlight. Apply cool compress or wet dressings. Take a bath in an oatmeal bath.  Sprinkle content of one Aveeno packet under running faucet with comfortably warm water .  Bathe for 15-20 minutes, 1-2 times daily.  Pat dry with a towel. Do not rub the rash. Use hydrocortisone cream. Take an antihistamine like Benadryl  for widespread rashes that itch.  The adult dose of Benadryl  is 25-50 mg by mouth 4 times daily. Caution:  This type of medication may cause sleepiness.  Do not drink alcohol, drive, or operate dangerous machinery while taking antihistamines.  Do not take these medications if you have prostate enlargement.  Read package instructions thoroughly on all medications that you take.  GET HELP RIGHT AWAY IF:  Symptoms don't go away after treatment. Severe itching that persists. If you rash spreads or swells. If you rash begins to smell. If it blisters and opens or develops a yellow-brown crust. You develop a fever. You have a sore throat. You become short of breath.  MAKE SURE YOU:  Understand these instructions. Will watch your condition. Will get help right away if you are not doing well or get worse.  Thank you for choosing an e-visit.  Your e-visit answers were reviewed by a board  certified advanced clinical practitioner to complete your personal care plan. Depending upon the condition, your plan could have included both over the counter or prescription medications.  Please review your pharmacy choice. Make sure the pharmacy is open so you can pick up prescription now. If there is a problem, you may contact your provider through [employer] and have the prescription routed to another pharmacy.  Your safety is important to us . If you have drug allergies check your prescription carefully.   For the next 24 hours you can use MyChart to ask questions about today's visit, request a non-urgent call back, or ask for a work or school excuse. You will get an email in the next two days asking about your experience. I hope that your e-visit has been valuable and will speed your recovery.

## 2023-12-24 NOTE — Progress Notes (Signed)
 I have spent 5 minutes in review of e-visit questionnaire, review and updating patient chart, medical decision making and response to patient.   Piedad Climes, PA-C

## 2023-12-28 ENCOUNTER — Ambulatory Visit

## 2024-01-04 ENCOUNTER — Ambulatory Visit

## 2024-01-04 DIAGNOSIS — L501 Idiopathic urticaria: Secondary | ICD-10-CM

## 2024-01-07 ENCOUNTER — Ambulatory Visit
Admission: RE | Admit: 2024-01-07 | Discharge: 2024-01-07 | Disposition: A | Discharge status: Home / Self Care | Source: Ambulatory Visit

## 2024-01-07 DIAGNOSIS — Z1231 Encounter for screening mammogram for malignant neoplasm of breast: Secondary | ICD-10-CM

## 2024-01-20 ENCOUNTER — Ambulatory Visit: Admitting: Orthopedic Surgery

## 2024-01-20 ENCOUNTER — Other Ambulatory Visit: Payer: Self-pay

## 2024-01-20 ENCOUNTER — Encounter: Payer: Self-pay | Admitting: Orthopedic Surgery

## 2024-01-20 DIAGNOSIS — M25551 Pain in right hip: Secondary | ICD-10-CM

## 2024-01-20 DIAGNOSIS — M1611 Unilateral primary osteoarthritis, right hip: Secondary | ICD-10-CM
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
## 2024-01-22 MED ORDER — BUPIVACAINE HCL 0.25 % IJ SOLN
4.0000 mL | INTRAMUSCULAR | Status: AC | PRN
  Administered 2024-01-20: 4 mL via INTRA_ARTICULAR

## 2024-01-22 MED ORDER — LIDOCAINE HCL 1 % IJ SOLN
5.0000 mL | INTRAMUSCULAR | Status: AC | PRN
  Administered 2024-01-20: 5 mL

## 2024-01-22 MED ORDER — TRIAMCINOLONE ACETONIDE 40 MG/ML IJ SUSP
40.0000 mg | INTRAMUSCULAR | Status: AC | PRN
  Administered 2024-01-20: 40 mg via INTRA_ARTICULAR

## 2024-01-24 ENCOUNTER — Ambulatory Visit
Admission: RE | Admit: 2024-01-24 | Discharge: 2024-01-24 | Disposition: A | Discharge status: Home / Self Care | Source: Ambulatory Visit | Attending: Physician Assistant | Admitting: Physician Assistant

## 2024-01-24 VITALS — BP 145/77 | HR 62 | Temp 98.2°F

## 2024-01-24 DIAGNOSIS — J069 Acute upper respiratory infection, unspecified: Secondary | ICD-10-CM

## 2024-01-24 LAB — POC SOFIA SARS ANTIGEN FIA: SARS Coronavirus 2 Ag: NEGATIVE

## 2024-01-24 MED ORDER — DOXYCYCLINE HYCLATE 100 MG PO CAPS: 100.0000 mg | ORAL_CAPSULE | Freq: Two times a day (BID) | ORAL | 0 refills | Status: AC

## 2024-01-24 NOTE — ED Triage Notes (Signed)
 Pt c/o productive cough x's 2 days  St's she has been coughing up green/yellow mucus.  St's she has been taking OTC mucinex  without relief
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
## 2024-01-29 ENCOUNTER — Ambulatory Visit: Payer: Self-pay | Admitting: Urgent Care

## 2024-01-29 ENCOUNTER — Ambulatory Visit (INDEPENDENT_AMBULATORY_CARE_PROVIDER_SITE_OTHER)

## 2024-01-29 ENCOUNTER — Ambulatory Visit
Admission: RE | Admit: 2024-01-29 | Discharge: 2024-01-29 | Disposition: A | Discharge status: Home / Self Care | Source: Ambulatory Visit | Attending: Family Medicine | Admitting: Family Medicine

## 2024-01-29 VITALS — BP 134/70 | HR 61 | Temp 98.2°F | Resp 18 | Wt 145.5 lb

## 2024-01-29 DIAGNOSIS — R058 Other specified cough: Secondary | ICD-10-CM

## 2024-01-29 DIAGNOSIS — J310 Chronic rhinitis: Secondary | ICD-10-CM | POA: Diagnosis not present

## 2024-01-29 MED ORDER — PREDNISONE 20 MG PO TABS: ORAL_TABLET | ORAL | 0 refills | Status: DC

## 2024-01-29 MED ORDER — PROMETHAZINE-DM 6.25-15 MG/5ML PO SYRP: 5.0000 mL | ORAL_SOLUTION | Freq: Every evening | ORAL | 0 refills | Status: DC | PRN

## 2024-01-29 NOTE — ED Triage Notes (Addendum)
 Still have productive cough as I did at last visit this past Sunday. Completed antibiotics - Entered by patient  Pt presents c/o productive cough x . Pt reports she has the same sxs as previous visit. Pt has completed an antibiotic prescribed for the sxs but the sxs have not improved. Pt is still taking Mucinex  as needed.
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
## 2024-01-29 NOTE — Discharge Instructions (Signed)
 Will update you with your chest x-ray results later today.  For now I would like for you to use a prednisone  course for the sinus inflammation, drainage, coughing.  The prednisone  can also help with bronchitis.  If your chest x-ray shows pneumonia, we will be using 2 different kinds of antibiotics.  Otherwise, we will hold off on using antibiotics.

## 2024-02-01 ENCOUNTER — Ambulatory Visit

## 2024-02-01 DIAGNOSIS — L501 Idiopathic urticaria: Secondary | ICD-10-CM

## 2024-02-03 ENCOUNTER — Ambulatory Visit: Admitting: Orthopedic Surgery

## 2024-02-08 ENCOUNTER — Encounter: Payer: Self-pay | Admitting: Neurology

## 2024-02-09 ENCOUNTER — Encounter: Payer: Self-pay | Admitting: Family Medicine

## 2024-02-09 ENCOUNTER — Ambulatory Visit (INDEPENDENT_AMBULATORY_CARE_PROVIDER_SITE_OTHER): Admitting: Family Medicine

## 2024-02-09 VITALS — BP 118/50 | HR 68 | Temp 98.3°F | Ht 63.0 in | Wt 155.4 lb

## 2024-02-09 DIAGNOSIS — J40 Bronchitis, not specified as acute or chronic: Secondary | ICD-10-CM | POA: Diagnosis not present

## 2024-02-09 MED ORDER — AZITHROMYCIN 250 MG PO TABS
ORAL_TABLET | ORAL | 0 refills | Status: DC
Start: 2024-02-09 — End: 2024-03-14

## 2024-02-09 MED ORDER — PREDNISONE 10 MG PO TABS
ORAL_TABLET | ORAL | 0 refills | Status: DC
Start: 2024-02-09 — End: 2024-03-14

## 2024-02-09 NOTE — Progress Notes (Signed)
   Subjective:    Patient ID: Melissa Jordan, female    DOB: 29-Oct-1945, 78 y.o.   MRN: [id number]  HPI Cough- pt reports sxs started 3 weeks ago.  Initially started w/ a runny nose and 'it settled in my chest'.  Was seen on 8/3 and 8/8 at [facility].  CXR on 8/8 was clear.  Was treated w/ prednisone  x5 days and doxycycline  x7 days.  Cough is episodic and occasionally productive.  Initially had a fever but not recently.  Throat is very sore- painful to swallow.     Review of Systems For ROS see HPI     Objective:   Physical Exam Vitals reviewed.  Constitutional:      General: She is not in acute distress.    Appearance: Normal appearance. She is well-developed. She is not ill-appearing.  HENT:     Head: Normocephalic and atraumatic.     Right Ear: Tympanic membrane and ear canal normal.     Left Ear: Tympanic membrane and ear canal normal.     Nose: Congestion present. No rhinorrhea.     Mouth/Throat:     Mouth: Mucous membranes are moist. No oral lesions.     Pharynx: Posterior oropharyngeal erythema (w/ copious PND) present. No oropharyngeal exudate.     Tonsils: No tonsillar exudate.  Eyes:     Conjunctiva/sclera: Conjunctivae normal.     Pupils: Pupils are equal, round, and reactive to light.  Cardiovascular:     Rate and Rhythm: Normal rate and regular rhythm.     Heart sounds: Normal heart sounds. No murmur heard. Pulmonary:     Effort: Pulmonary effort is normal. No respiratory distress.     Breath sounds: Normal breath sounds. No wheezing or rhonchi.     Comments: + dry cough Musculoskeletal:     Cervical back: Normal range of motion and neck supple.  Lymphadenopathy:     Cervical: No cervical adenopathy.  Skin:    General: Skin is warm and dry.  Neurological:     General: No focal deficit present.     Mental Status: She is alert and oriented to person, place, and time.  Psychiatric:        Mood and Affect: Mood normal.        Behavior: Behavior normal.        Thought  Content: Thought content normal.           Assessment & Plan:  Bronchitis- new.  No improvement w/ Doxy or Prednisone .  Will start Azithro for it's anti-inflammatory properties and also the atypical coverage.  Will also do a longer prednisone  taper to help w/ congestion and airway inflammation.  Reviewed supportive care and red flags that should prompt return.  Pt expressed understanding and is in agreement w/ plan.

## 2024-02-09 NOTE — Patient Instructions (Signed)
 Follow up as needed or as scheduled START the Zpack as directed START the Prednisone  as directed- 3 pills at the same time x3 days, then 2 pills at the same time x3 days, then 1 pill daily.  Take w/ food  Drink LOTS of fluids Delsym or Robitussin as needed for cough REST! Call with any questions or concerns Hang in there!!!

## 2024-02-15 ENCOUNTER — Other Ambulatory Visit: Payer: Self-pay | Admitting: Orthopedic Surgery

## 2024-02-15 DIAGNOSIS — T8484XA Pain due to internal orthopedic prosthetic devices, implants and grafts, initial encounter: Secondary | ICD-10-CM | POA: Diagnosis not present

## 2024-02-15 DIAGNOSIS — M25472 Effusion, left ankle: Secondary | ICD-10-CM | POA: Diagnosis not present

## 2024-02-15 DIAGNOSIS — M79672 Pain in left foot: Secondary | ICD-10-CM | POA: Diagnosis not present

## 2024-02-16 ENCOUNTER — Inpatient Hospital Stay
Admission: RE | Admit: 2024-02-16 | Discharge: 2024-02-16 | Discharge status: Home / Self Care | Source: Ambulatory Visit | Attending: Orthopedic Surgery | Admitting: Orthopedic Surgery

## 2024-02-16 DIAGNOSIS — M25572 Pain in left ankle and joints of left foot: Secondary | ICD-10-CM | POA: Diagnosis not present

## 2024-02-16 DIAGNOSIS — T8484XA Pain due to internal orthopedic prosthetic devices, implants and grafts, initial encounter: Secondary | ICD-10-CM

## 2024-02-18 ENCOUNTER — Encounter: Payer: Self-pay | Admitting: Family Medicine

## 2024-02-18 MED ORDER — PROMETHAZINE-DM 6.25-15 MG/5ML PO SYRP: 5.0000 mL | ORAL_SOLUTION | Freq: Every evening | ORAL | 0 refills | Status: DC | PRN

## 2024-02-18 NOTE — Addendum Note (Signed)
 Addended by: Maurya Nethery E on: 02/18/2024 03:46 PM   Modules accepted: Orders

## 2024-02-23 ENCOUNTER — Other Ambulatory Visit: Payer: Self-pay | Admitting: Family Medicine

## 2024-02-24 ENCOUNTER — Encounter: Payer: Self-pay | Admitting: Family Medicine

## 2024-02-24 NOTE — Telephone Encounter (Signed)
 Patients grandchildren have HFM. She believes she has it too. She is out of town. She is wondering if there is anything she can do to help?

## 2024-02-25 ENCOUNTER — Encounter: Payer: Self-pay | Admitting: Cardiology

## 2024-02-26 ENCOUNTER — Telehealth: Payer: Self-pay

## 2024-02-26 NOTE — Telephone Encounter (Signed)
 ERR

## 2024-02-29 ENCOUNTER — Ambulatory Visit

## 2024-03-01 NOTE — Telephone Encounter (Signed)
 Please see patients wife note below

## 2024-03-08 ENCOUNTER — Ambulatory Visit (INDEPENDENT_AMBULATORY_CARE_PROVIDER_SITE_OTHER)

## 2024-03-08 DIAGNOSIS — L501 Idiopathic urticaria: Secondary | ICD-10-CM | POA: Diagnosis not present

## 2024-03-14 ENCOUNTER — Other Ambulatory Visit: Payer: Self-pay

## 2024-03-14 ENCOUNTER — Ambulatory Visit (INDEPENDENT_AMBULATORY_CARE_PROVIDER_SITE_OTHER): Admitting: Internal Medicine

## 2024-03-14 ENCOUNTER — Encounter: Payer: Self-pay | Admitting: Internal Medicine

## 2024-03-14 VITALS — BP 114/62 | HR 58 | Temp 98.0°F | Wt 159.0 lb

## 2024-03-14 DIAGNOSIS — J310 Chronic rhinitis: Secondary | ICD-10-CM | POA: Diagnosis not present

## 2024-03-14 DIAGNOSIS — Z91018 Allergy to other foods: Secondary | ICD-10-CM | POA: Diagnosis not present

## 2024-03-14 DIAGNOSIS — L501 Idiopathic urticaria: Secondary | ICD-10-CM | POA: Diagnosis not present

## 2024-03-14 NOTE — Patient Instructions (Addendum)
 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 Can increase up to 4 tablets during a flare. -Use Hydroxyzine  at night if needed for flare, mindful of potential drowsiness and fall risk. - continue Xolair  (omalizumab ) 300 mg q4- will start spacing out to q6 weeks, then q8 weeks, then q10 weeks, then q12 weeks. Once at q12 weeks, we will discontinue if doing well.   Can use one of the following in place of zyrtec  if desires: Claritin  (loratadine ) 10 mg, Xyzal  (levocetirizine) 5 mg or Allegra (fexofenadine) 180 mg daily as needed  Alpha gal allergy: stable - continue to avoid mammalian meat  - for symptoms concerning for respiratory distress or abdominal symptoms with hives, please use your epipen  and let us  know. - consider retesting once off Xolair  for at least 3-6 months  Chronic rhinitis;  - Claritin  as above - can use azelastine  nasal spray - 1-2 sprays up to twice daily as needed.   Follow up : 6 months, sooner if needed It was a pleasure seeing you again in clinic today! Thank you for allowing me to participate in your care.  Rocky Endow, MD [facility]

## 2024-03-14 NOTE — Progress Notes (Signed)
 FOLLOW UP Date of Service/Encounter:   03/14/2024  Subjective:  Melissa Jordan (DOB: July 15, 1945) is a 78 y.o. female who [facility]24 in re-evaluation of the following: Chronic urticaria, alpha gal allergy  History obtained from: chart review and patient.  For Review, LV was on 09/21/23  with Dr.Brynnleigh Mcelwee seen for routine follow-up. See below for summary of history and diagnostics.   Therapeutic plans/changes recommended: doing well and stayed on Xolair  ----------------------------------------------------- Pertinent History/Diagnostics:  Urticaria: on Xolair  Prior to January 2024, had some blistery hives which were similar but not exactly the same as current rash.  Treated for viral illness and given prednisone . Resolved after stopping plavix . February/March 2024, she started getting little groups of hives. They would stay in same spot for a few days. No bruising. Occurring daily. Had Covid around time hives first appeared. Evaluated by Dermatology 10/13/22:  Plan: urticaria-clobetasol , AH PRN, finish course prednisone , FU AI; purpura: benign, OTC arnica and observe 2 ED visits -4/12, 4/13 and -04/14 allergic dermatitis; tx-depomedrol, prednisone , benadryl , atarax  10/18/22: CMP with elevated creatinine and BUN, low total protein, CBCd - elevated WBC 22 with left shift ANC 19.1 - Xolair  first dose 11/27/22, receiving 300 mg every 4 weeks - 10/22/22: normal tryptase 3.2, CU index 5.6, normal thyroid  levels, positive alpha-gal--recommended diet free of mammalian meat 12/17/22: Singluar discontinued. Not effective Venom Allergy:  -completed VIT years ago, reaction 20 years ago (hives, swelling, presyncope, etc). --------------------------------------------------- Today presents for follow-up. Discussed the use of AI scribe software for clinical note transcription with the patient, who gave verbal consent to proceed.  History of Present  Illness Melissa Jordan is a 78 year old female with alpha-gal allergy who presents for follow-up on Xolair  treatment.  Alpha-gal allergy and dietary management - Alpha-gal allergy with ongoing avoidance of trigger foods - No systemic allergic reactions since starting Xolair  - Able to tolerate cheese without issues - Accidental ingestion of baked beans with meat at a church event without allergic reaction - Occasionally not careful with food choices but generally successful in avoiding triggers  Xolair  injection site reaction - Hives localized only at the Xolair  injection site a few months ago - No other hives since initiation of Xolair  therapy  Tick exposure - Five tick bites this year - [place] time in [place] backyard - Uses chickens to help manage tick population  Chronic foot pain and mobility impairment - 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   All medications reviewed by clinical staff and updated in chart. No new pertinent medical or surgical history except as noted in HPI.  ROS: All others negative except as noted per HPI.   Objective:  BP 114/62 (BP Location: Right Arm, Patient Position: Sitting, Cuff Size: Normal)   Pulse (!) 58   Temp 98 F (36.7 C) (Temporal)   Wt 159 lb (72.1 kg)   SpO2 97%   BMI 28.17 kg/m  Body mass index is 28.17 kg/m. Physical Exam: General Appearance:  Alert, cooperative, no distress, appears stated age  Head:  Normocephalic, without obvious abnormality, atraumatic  Eyes:  Conjunctiva clear, EOM's intact  Nose: Nares normal, hypertrophic turbinates, normal mucosa, and no visible  anterior polyps  Throat: Lips, tongue normal; teeth and gums  normal, normal posterior oropharynx  Neck: Supple, symmetrical  Lungs:   clear to auscultation bilaterally, Respirations unlabored, no coughing  Heart:  regular rate and rhythm and no murmur, Appears well perfused  Extremities: No edema  Skin: Skin color, texture, turgor normal and no rashes or lesions on visualized portions of skin  Neurologic: No gross deficits   Labs:  Lab Orders  No laboratory test(s) ordered today   Assessment/Plan   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 Can increase up to 4 tablets during a flare. -Use Hydroxyzine  at night if needed for flare, mindful of potential drowsiness and fall risk. - continue Xolair  (omalizumab ) 300 mg q4- will start spacing out to q6 weeks, then q8 weeks, then q10 weeks, then q12 weeks. Once at q12 weeks, we will discontinue if doing well.   Can use one of the following in place of zyrtec  if desires: Claritin  (loratadine ) 10 mg, Xyzal  (levocetirizine) 5 mg or Allegra (fexofenadine) 180 mg daily as needed  Alpha gal allergy: stable - continue to avoid mammalian meat  - for symptoms concerning for respiratory distress or abdominal symptoms with hives, please use your epipen  and let us  know. - consider retesting once off Xolair  for at least 3-6 months  Chronic rhinitis; at goal - Claritin  as above - can use azelastine  nasal spray - 1-2 sprays up to twice daily as needed.   Follow up : 6 months, sooner if needed It was a pleasure seeing you again in clinic today! Thank you for allowing me to participate in your care.  Other: none  Rocky Endow, MD  [facility] 

## 2024-03-22 ENCOUNTER — Ambulatory Visit (INDEPENDENT_AMBULATORY_CARE_PROVIDER_SITE_OTHER): Admitting: Family Medicine

## 2024-03-22 ENCOUNTER — Encounter: Payer: Self-pay | Admitting: Family Medicine

## 2024-03-22 ENCOUNTER — Encounter: Payer: Self-pay | Admitting: Neurology

## 2024-03-22 VITALS — BP 122/62 | HR 78 | Temp 97.8°F | Ht 63.0 in | Wt 158.5 lb

## 2024-03-22 DIAGNOSIS — L258 Unspecified contact dermatitis due to other agents: Secondary | ICD-10-CM | POA: Diagnosis not present

## 2024-03-22 MED ORDER — PREDNISONE 10 MG PO TABS: ORAL_TABLET | ORAL | 0 refills | Status: AC

## 2024-03-22 NOTE — Patient Instructions (Signed)
 Follow up as needed or as scheduled START the Prednisone  as directed- 3 pills at the same time x3 days, then 2 pills at the same time x3 days, then 1 pill daily.  Take w/ food  CONTINUE the Loratadine  and Benadryl  as needed Call with any questions or concerns Hang in there!!!

## 2024-03-22 NOTE — Progress Notes (Signed)
   Subjective:    Patient ID: Melissa Jordan, female    DOB: 20-Aug-1945, 78 y.o.   MRN: [id number]  HPI Rash- pt was working in her yard Saturday and woke up yesterday w/ redness and itching of her R cheek.  This morning face is more itchy and swollen.  Has been taking benadryl  and using topical cream w/o relief.  Also taking Loratadine  daily   Review of Systems For ROS see HPI     Objective:   Physical Exam Vitals reviewed.  Constitutional:      General: She is not in acute distress.    Appearance: Normal appearance. She is not ill-appearing.  Skin:    General: Skin is warm and dry.     Findings: Rash (erythematous contact dermatitis on face w/ mild swelling) present.  Neurological:     General: No focal deficit present.     Mental Status: She is alert and oriented to person, place, and time.  Psychiatric:        Mood and Affect: Mood normal.        Behavior: Behavior normal.        Thought Content: Thought content normal.           Assessment & Plan:  Contact dermatitis- new.  Pt was working on the yard and was exposed to [employer] on her neck and face.  Despite washing, she developed an itchy red rash and swelling.  Start Prednisone  taper.  Reviewed supportive care and red flags that should prompt return.  Pt expressed understanding and is in agreement w/ plan.

## 2024-03-30 ENCOUNTER — Other Ambulatory Visit: Payer: Self-pay

## 2024-03-30 DIAGNOSIS — R251 Tremor, unspecified: Secondary | ICD-10-CM

## 2024-04-03 ENCOUNTER — Other Ambulatory Visit: Payer: Self-pay | Admitting: Family Medicine

## 2024-04-03 DIAGNOSIS — M8580 Other specified disorders of bone density and structure, unspecified site: Secondary | ICD-10-CM

## 2024-04-04 NOTE — Progress Notes (Unsigned)
  Electrophysiology Office Follow up Visit Note:    Date:  04/05/2024   ID:  Melissa Jordan, DOB [birth year], MRN [id number]  PCP:  Mahlon Comer BRAVO, MD  [facility] Cardiologist:  Lonni LITTIE Nanas, MD  [facility] Electrophysiologist:  OLE ONEIDA HOLTS, MD    Interval History:     Melissa Jordan is a 78 y.o. female who presents for a follow up visit.   The patient was last seen by [facility] September 08, 2023.  She has a history of atrial fibrillation and has a Youth worker in place.  She is doing well.  No problems with her atrial fibrillation.  She had 1 episode a couple weeks ago that lasted a few minutes and then resolved spontaneously.      Past medical, surgical, social and family history were reviewed.  ROS:   Please see the history of present illness.    All other systems reviewed and are negative.  EKGs/Labs/Other Studies Reviewed:    The following studies were reviewed today:          Physical Exam:    VS:  BP 126/64   Pulse 72   Ht 5' 3 (1.6 m)   Wt 156 lb (70.8 kg)   SpO2 94%   BMI 27.63 kg/m     Wt Readings from Last 3 Encounters:  04/05/24 156 lb (70.8 kg)  04/05/24 158 lb (71.7 kg)  03/22/24 158 lb 8 oz (71.9 kg)     GEN: no distress CARD: RRR, No MRG RESP: No IWOB. CTAB.      ASSESSMENT:    1. Presence of Watchman left atrial appendage closure device   2. Paroxysmal atrial fibrillation (HCC)    PLAN:    In order of problems listed above:  #Atrial fibrillation #Watchman device in situ Low burden of arrhythmia  I discussed my upcoming departure from [facility] today.  She will follow-up with Dr. Kennyth moving forward.  Follow-up with EP in 1 year    Signed, OLE HOLTS, MD, [facility], [facility] 04/05/2024 1:35 PM    Electrophysiology [facility]

## 2024-04-05 ENCOUNTER — Other Ambulatory Visit: Payer: Self-pay | Admitting: Neurology

## 2024-04-05 ENCOUNTER — Ambulatory Visit: Attending: Cardiology | Admitting: Cardiology

## 2024-04-05 ENCOUNTER — Ambulatory Visit

## 2024-04-05 ENCOUNTER — Ambulatory Visit: Payer: Medicare Other | Admitting: *Deleted

## 2024-04-05 VITALS — Ht 63.0 in | Wt 158.0 lb

## 2024-04-05 VITALS — BP 126/64 | HR 72 | Ht 63.0 in | Wt 156.0 lb

## 2024-04-05 DIAGNOSIS — Z0000 Encounter for general adult medical examination without abnormal findings: Secondary | ICD-10-CM

## 2024-04-05 DIAGNOSIS — Z95818 Presence of other cardiac implants and grafts: Secondary | ICD-10-CM

## 2024-04-05 DIAGNOSIS — I480 Paroxysmal atrial fibrillation: Secondary | ICD-10-CM

## 2024-04-05 DIAGNOSIS — L501 Idiopathic urticaria: Secondary | ICD-10-CM | POA: Diagnosis not present
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
## 2024-04-05 NOTE — Patient Instructions (Signed)
 Ms. Melissa Jordan , Thank you for taking time to come for your Medicare Wellness Visit. I appreciate your ongoing commitment to your health goals. Please review the following plan we discussed and let me know if I can assist you in the future.   Screening recommendations/referrals: Colonoscopy:  Mammogram: up to date Bone Density:  Recommended yearly ophthalmology/optometry visit for glaucoma screening and checkup Recommended yearly dental visit for hygiene and checkup  Vaccinations: Influenza vaccine:  Pneumococcal vaccine:  Tdap vaccine:  Shingles vaccine:        Preventive Care 65 Years and Older, Female Preventive care refers to lifestyle choices and visits with your health care provider that can promote health and wellness. What does preventive care include? A yearly physical exam. This is also called an annual well check. Dental exams once or twice a year. Routine eye exams. Ask your health care provider how often you should have your eyes checked. Personal lifestyle choices, including: Daily care of your teeth and gums. Regular physical activity. Eating a healthy diet. Avoiding tobacco and drug use. Limiting alcohol use. Practicing safe sex. Taking low-dose aspirin  every day. Taking vitamin and mineral supplements as recommended by your health care provider. What happens during an annual well check? The services and screenings done by your health care provider during your annual well check will depend on your age, overall health, lifestyle risk factors, and family history of disease. Counseling  Your health care provider may ask you questions about your: Alcohol use. Tobacco use. Drug use. Emotional well-being. Home and relationship well-being. Sexual activity. Eating habits. History of falls. Memory and ability to understand (cognition). Work and work Astronomer. Reproductive health. Screening  You may have the following tests or measurements: Height, weight,  and BMI. Blood pressure. Lipid and cholesterol levels. These may be checked every 5 years, or more frequently if you are over 61 years old. Skin check. Lung cancer screening. You may have this screening every year starting at age 7 if you have a 30-pack-year history of smoking and currently smoke or have quit within the past 15 years. Fecal occult blood test (FOBT) of the stool. You may have this test every year starting at age 59. Flexible sigmoidoscopy or colonoscopy. You may have a sigmoidoscopy every 5 years or a colonoscopy every 10 years starting at age 32. Hepatitis C blood test. Hepatitis B blood test. Sexually transmitted disease (STD) testing. Diabetes screening. This is done by checking your blood sugar (glucose) after you have not eaten for a while (fasting). You may have this done every 1-3 years. Bone density scan. This is done to screen for osteoporosis. You may have this done starting at age 3. Mammogram. This may be done every 1-2 years. Talk to your health care provider about how often you should have regular mammograms. Talk with your health care provider about your test results, treatment options, and if necessary, the need for more tests. Vaccines  Your health care provider may recommend certain vaccines, such as: Influenza vaccine. This is recommended every year. Tetanus, diphtheria, and acellular pertussis (Tdap, Td) vaccine. You may need a Td booster every 10 years. Zoster vaccine. You may need this after age 46. Pneumococcal 13-valent conjugate (PCV13) vaccine. One dose is recommended after age 8. Pneumococcal polysaccharide (PPSV23) vaccine. One dose is recommended after age 15. Talk to your health care provider about which screenings and vaccines you need and how often you need them. This information is not intended to replace advice given to you by  your health care provider. Make sure you discuss any questions you have with your health care provider. Document  Released: 07/06/2015 Document Revised: 02/27/2016 Document Reviewed: 04/10/2015 Elsevier Interactive Patient Education  2017 [employer].  Fall Prevention in the Home Falls can cause injuries. They can happen to people of all ages. There are many things you can do to make your home safe and to help prevent falls. What can I do on the outside of my home? Regularly fix the edges of walkways and driveways and fix any cracks. Remove anything that might make you trip as you walk through a door, such as a raised step or threshold. Trim any bushes or trees on the path to your home. Use bright outdoor lighting. Clear any walking paths of anything that might make someone trip, such as rocks or tools. Regularly check to see if handrails are loose or broken. Make sure that both sides of any steps have handrails. Any raised decks and porches should have guardrails on the edges. Have any leaves, snow, or ice cleared regularly. Use sand or salt on walking paths during winter. Clean up any spills in your garage right away. This includes oil or grease spills. What can I do in the bathroom? Use night lights. Install grab bars by the toilet and in the tub and shower. Do not use towel bars as grab bars. Use non-skid mats or decals in the tub or shower. If you need to sit down in the shower, use a plastic, non-slip stool. Keep the floor dry. Clean up any water  that spills on the floor as soon as it happens. Remove soap buildup in the tub or shower regularly. Attach bath mats securely with double-sided non-slip rug tape. Do not have throw rugs and other things on the floor that can make you trip. What can I do in the bedroom? Use night lights. Make sure that you have a light by your bed that is easy to reach. Do not use any sheets or blankets that are too big for your bed. They should not hang down onto the floor. Have a firm chair that has side arms. You can use this for support while you get dressed. Do  not have throw rugs and other things on the floor that can make you trip. What can I do in the kitchen? Clean up any spills right away. Avoid walking on wet floors. Keep items that you use a lot in easy-to-reach places. If you need to reach something above you, use a strong step stool that has a grab bar. Keep electrical cords out of the way. Do not use floor polish or wax that makes floors slippery. If you must use wax, use non-skid floor wax. Do not have throw rugs and other things on the floor that can make you trip. What can I do with my stairs? Do not leave any items on the stairs. Make sure that there are handrails on both sides of the stairs and use them. Fix handrails that are broken or loose. Make sure that handrails are as long as the stairways. Check any carpeting to make sure that it is firmly attached to the stairs. Fix any carpet that is loose or worn. Avoid having throw rugs at the top or bottom of the stairs. If you do have throw rugs, attach them to the floor with carpet tape. Make sure that you have a light switch at the top of the stairs and the bottom of the stairs. If you  do not have them, ask someone to add them for you. What else can I do to help prevent falls? Wear shoes that: Do not have high heels. Have rubber bottoms. Are comfortable and fit you well. Are closed at the toe. Do not wear sandals. If you use a stepladder: Make sure that it is fully opened. Do not climb a closed stepladder. Make sure that both sides of the stepladder are locked into place. Ask someone to hold it for you, if possible. Clearly mark and make sure that you can see: Any grab bars or handrails. First and last steps. Where the edge of each step is. Use tools that help you move around (mobility aids) if they are needed. These include: Canes. Walkers. Scooters. Crutches. Turn on the lights when you go into a dark area. Replace any light bulbs as soon as they burn out. Set up your  furniture so you have a clear path. Avoid moving your furniture around. If any of your floors are uneven, fix them. If there are any pets around you, be aware of where they are. Review your medicines with your doctor. Some medicines can make you feel dizzy. This can increase your chance of falling. Ask your doctor what other things that you can do to help prevent falls. This information is not intended to replace advice given to you by your health care provider. Make sure you discuss any questions you have with your health care provider. Document Released: 04/05/2009 Document Revised: 11/15/2015 Document Reviewed: 07/14/2014 Elsevier Interactive Patient Education  2017 [employer].

## 2024-04-05 NOTE — Patient Instructions (Signed)
 Medication Instructions:  Your physician recommends that you continue on your current medications as directed. Please refer to the Current Medication list given to you today.  *If you need a refill on your cardiac medications before your next appointment, please call your pharmacy*  Follow-Up: At [facility], you and your health needs are our priority.  As part of our continuing mission to provide you with exceptional heart care, our providers are all part of one team.  This team includes your primary Cardiologist (physician) and Advanced Practice Providers or APPs (Physician Assistants and Nurse Practitioners) who all work together to provide you with the care you need, when you need it.  Your next appointment:   1 year  Provider:   Ardeen Kohler, MD

## 2024-04-06 DIAGNOSIS — M25551 Pain in right hip: Secondary | ICD-10-CM | POA: Diagnosis not present

## 2024-04-06 DIAGNOSIS — M546 Pain in thoracic spine: Secondary | ICD-10-CM | POA: Diagnosis not present

## 2024-04-08 ENCOUNTER — Other Ambulatory Visit (HOSPITAL_COMMUNITY): Payer: Self-pay | Admitting: Neurological Surgery

## 2024-04-08 DIAGNOSIS — M546 Pain in thoracic spine: Secondary | ICD-10-CM

## 2024-04-11 DIAGNOSIS — M79672 Pain in left foot: Secondary | ICD-10-CM | POA: Diagnosis not present

## 2024-04-11 DIAGNOSIS — Z9689 Presence of other specified functional implants: Secondary | ICD-10-CM | POA: Diagnosis not present

## 2024-04-11 DIAGNOSIS — M546 Pain in thoracic spine: Secondary | ICD-10-CM | POA: Diagnosis not present

## 2024-04-11 DIAGNOSIS — T8484XA Pain due to internal orthopedic prosthetic devices, implants and grafts, initial encounter: Secondary | ICD-10-CM | POA: Diagnosis not present

## 2024-04-21 ENCOUNTER — Encounter: Payer: Self-pay | Admitting: Physical Medicine & Rehabilitation

## 2024-04-25 ENCOUNTER — Encounter: Payer: Self-pay | Admitting: Radiology

## 2024-05-17 ENCOUNTER — Ambulatory Visit

## 2024-05-30 ENCOUNTER — Ambulatory Visit

## 2024-05-31 ENCOUNTER — Encounter: Admitting: Physical Medicine & Rehabilitation

## 2024-06-03 ENCOUNTER — Ambulatory Visit

## 2024-06-03 DIAGNOSIS — L501 Idiopathic urticaria: Secondary | ICD-10-CM

## 2024-06-07 ENCOUNTER — Ambulatory Visit: Admitting: Neurology

## 2024-06-29 ENCOUNTER — Other Ambulatory Visit: Payer: Self-pay | Admitting: Family Medicine

## 2024-07-06 ENCOUNTER — Other Ambulatory Visit (INDEPENDENT_AMBULATORY_CARE_PROVIDER_SITE_OTHER)

## 2024-07-06 ENCOUNTER — Encounter: Payer: Self-pay | Admitting: Orthopedic Surgery

## 2024-07-06 ENCOUNTER — Ambulatory Visit: Admitting: Orthopedic Surgery

## 2024-07-06 DIAGNOSIS — M25551 Pain in right hip: Secondary | ICD-10-CM
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
## 2024-07-15 ENCOUNTER — Other Ambulatory Visit: Payer: Self-pay | Admitting: Neurology

## 2024-07-15 DIAGNOSIS — R251 Tremor, unspecified: Secondary | ICD-10-CM

## 2024-07-22 ENCOUNTER — Encounter: Admitting: Physical Medicine & Rehabilitation

## 2024-08-04 ENCOUNTER — Encounter: Admitting: Physical Medicine & Rehabilitation

## 2024-08-12 ENCOUNTER — Ambulatory Visit

## 2024-09-12 ENCOUNTER — Ambulatory Visit: Admitting: Internal Medicine

## 2024-09-15 ENCOUNTER — Other Ambulatory Visit (HOSPITAL_COMMUNITY)

## 2024-09-30 ENCOUNTER — Ambulatory Visit: Admitting: Neurology

## 2025-04-12 ENCOUNTER — Ambulatory Visit

## 2025-04-25 ENCOUNTER — Ambulatory Visit
